# Patient Record
Sex: Male | Born: 1940 | Race: White | Hispanic: No | Smoking: Never smoker
Health system: Southern US, Community
[De-identification: ages and names within clinical notes are randomized; demographics above are authoritative.]

## PROBLEM LIST (undated history)

## (undated) DIAGNOSIS — S72009A Fracture of unspecified part of neck of unspecified femur, initial encounter for closed fracture: Secondary | ICD-10-CM

## (undated) DIAGNOSIS — T8859XA Other complications of anesthesia, initial encounter: Secondary | ICD-10-CM

## (undated) DIAGNOSIS — C259 Malignant neoplasm of pancreas, unspecified: Secondary | ICD-10-CM

## (undated) DIAGNOSIS — I10 Essential (primary) hypertension: Secondary | ICD-10-CM

## (undated) DIAGNOSIS — S7290XA Unspecified fracture of unspecified femur, initial encounter for closed fracture: Secondary | ICD-10-CM

## (undated) DIAGNOSIS — C8589 Other specified types of non-Hodgkin lymphoma, extranodal and solid organ sites: Secondary | ICD-10-CM

## (undated) HISTORY — PX: SHOULDER SURGERY: SHX246

## (undated) HISTORY — DX: Fracture of unspecified part of neck of unspecified femur, initial encounter for closed fracture: S72.009A

## (undated) HISTORY — DX: Unspecified fracture of unspecified femur, initial encounter for closed fracture: S72.90XA

## (undated) HISTORY — DX: Essential (primary) hypertension: I10

## (undated) HISTORY — DX: Malignant neoplasm of pancreas, unspecified: C25.9

## (undated) HISTORY — PX: LEG SURGERY: SHX1003

## (undated) HISTORY — DX: Other specified types of non-hodgkin lymphoma, extranodal and solid organ sites: C85.89

## (undated) HISTORY — PX: FEMUR SURGERY: SHX943

---

## 1998-06-08 DIAGNOSIS — C4491 Basal cell carcinoma of skin, unspecified: Secondary | ICD-10-CM

## 1998-06-08 HISTORY — DX: Basal cell carcinoma of skin, unspecified: C44.91

## 1998-07-05 ENCOUNTER — Ambulatory Visit (HOSPITAL_COMMUNITY): Admission: RE | Admit: 1998-07-05 | Discharge: 1998-07-05 | Payer: Self-pay | Admitting: Family Medicine

## 1999-06-17 ENCOUNTER — Encounter (INDEPENDENT_AMBULATORY_CARE_PROVIDER_SITE_OTHER): Payer: Self-pay | Admitting: Specialist

## 1999-06-17 ENCOUNTER — Ambulatory Visit (HOSPITAL_COMMUNITY): Admission: RE | Admit: 1999-06-17 | Discharge: 1999-06-17 | Payer: Self-pay | Admitting: Gastroenterology

## 2000-11-23 ENCOUNTER — Encounter: Payer: Self-pay | Admitting: General Surgery

## 2000-11-23 ENCOUNTER — Inpatient Hospital Stay (HOSPITAL_COMMUNITY): Admission: AD | Admit: 2000-11-23 | Discharge: 2000-11-28 | Payer: Self-pay | Admitting: General Surgery

## 2000-11-28 ENCOUNTER — Inpatient Hospital Stay
Admission: RE | Admit: 2000-11-28 | Discharge: 2000-12-24 | Payer: Self-pay | Admitting: Physical Medicine & Rehabilitation

## 2000-12-10 ENCOUNTER — Encounter: Payer: Self-pay | Admitting: Physical Medicine & Rehabilitation

## 2000-12-10 ENCOUNTER — Ambulatory Visit (HOSPITAL_COMMUNITY)
Admission: RE | Admit: 2000-12-10 | Discharge: 2000-12-10 | Payer: Self-pay | Admitting: Physical Medicine & Rehabilitation

## 2000-12-22 ENCOUNTER — Encounter: Payer: Self-pay | Admitting: Orthopedic Surgery

## 2000-12-24 ENCOUNTER — Inpatient Hospital Stay (HOSPITAL_COMMUNITY)
Admission: RE | Admit: 2000-12-24 | Discharge: 2001-01-09 | Payer: Self-pay | Admitting: Physical Medicine & Rehabilitation

## 2001-01-02 ENCOUNTER — Encounter: Payer: Self-pay | Admitting: Physical Medicine & Rehabilitation

## 2001-01-04 ENCOUNTER — Encounter: Payer: Self-pay | Admitting: Physical Medicine & Rehabilitation

## 2001-01-17 ENCOUNTER — Encounter
Admission: RE | Admit: 2001-01-17 | Discharge: 2001-04-17 | Payer: Self-pay | Admitting: Physical Medicine & Rehabilitation

## 2001-03-22 ENCOUNTER — Ambulatory Visit (HOSPITAL_COMMUNITY): Admission: RE | Admit: 2001-03-22 | Discharge: 2001-03-22 | Payer: Self-pay | Admitting: Orthopedic Surgery

## 2001-03-22 ENCOUNTER — Encounter: Payer: Self-pay | Admitting: Orthopedic Surgery

## 2001-04-18 ENCOUNTER — Encounter
Admission: RE | Admit: 2001-04-18 | Discharge: 2001-05-06 | Payer: Self-pay | Admitting: Physical Medicine & Rehabilitation

## 2001-05-24 ENCOUNTER — Encounter: Payer: Self-pay | Admitting: Orthopedic Surgery

## 2001-05-24 ENCOUNTER — Ambulatory Visit (HOSPITAL_COMMUNITY): Admission: RE | Admit: 2001-05-24 | Discharge: 2001-05-24 | Payer: Self-pay | Admitting: Orthopedic Surgery

## 2001-06-07 ENCOUNTER — Encounter: Payer: Self-pay | Admitting: Orthopedic Surgery

## 2001-06-07 ENCOUNTER — Ambulatory Visit (HOSPITAL_COMMUNITY): Admission: RE | Admit: 2001-06-07 | Discharge: 2001-06-07 | Payer: Self-pay | Admitting: Orthopedic Surgery

## 2001-07-25 ENCOUNTER — Encounter: Admission: RE | Admit: 2001-07-25 | Discharge: 2001-07-26 | Payer: Self-pay | Admitting: Orthopedic Surgery

## 2001-09-26 DIAGNOSIS — C4492 Squamous cell carcinoma of skin, unspecified: Secondary | ICD-10-CM

## 2001-09-26 HISTORY — DX: Squamous cell carcinoma of skin, unspecified: C44.92

## 2002-05-24 ENCOUNTER — Encounter: Payer: Self-pay | Admitting: Emergency Medicine

## 2002-05-24 ENCOUNTER — Emergency Department (HOSPITAL_COMMUNITY): Admission: EM | Admit: 2002-05-24 | Discharge: 2002-05-24 | Payer: Self-pay | Admitting: Emergency Medicine

## 2003-02-19 ENCOUNTER — Ambulatory Visit (HOSPITAL_COMMUNITY): Admission: RE | Admit: 2003-02-19 | Discharge: 2003-02-19 | Payer: Self-pay | Admitting: Family Medicine

## 2003-02-19 ENCOUNTER — Encounter: Payer: Self-pay | Admitting: Family Medicine

## 2006-01-17 ENCOUNTER — Encounter: Payer: Self-pay | Admitting: Family Medicine

## 2007-07-19 ENCOUNTER — Ambulatory Visit (HOSPITAL_COMMUNITY): Admission: RE | Admit: 2007-07-19 | Discharge: 2007-07-19 | Payer: Self-pay | Admitting: Family Medicine

## 2007-07-25 ENCOUNTER — Ambulatory Visit: Payer: Self-pay | Admitting: Hematology and Oncology

## 2007-07-30 ENCOUNTER — Encounter (INDEPENDENT_AMBULATORY_CARE_PROVIDER_SITE_OTHER): Payer: Self-pay | Admitting: Interventional Radiology

## 2007-07-30 ENCOUNTER — Ambulatory Visit (HOSPITAL_COMMUNITY): Admission: RE | Admit: 2007-07-30 | Discharge: 2007-07-30 | Payer: Self-pay | Admitting: General Surgery

## 2007-08-02 LAB — CBC WITH DIFFERENTIAL/PLATELET
Basophils Absolute: 0 10*3/uL (ref 0.0–0.1)
Eosinophils Absolute: 0.1 10*3/uL (ref 0.0–0.5)
HCT: 47.2 % (ref 38.7–49.9)
HGB: 16.1 g/dL (ref 13.0–17.1)
LYMPH%: 14.5 % (ref 14.0–48.0)
MCV: 82.6 fL (ref 81.6–98.0)
MONO#: 0.4 10*3/uL (ref 0.1–0.9)
MONO%: 4 % (ref 0.0–13.0)
NEUT#: 8.1 10*3/uL — ABNORMAL HIGH (ref 1.5–6.5)
NEUT%: 80 % — ABNORMAL HIGH (ref 40.0–75.0)
Platelets: 141 10*3/uL — ABNORMAL LOW (ref 145–400)
WBC: 10.1 10*3/uL — ABNORMAL HIGH (ref 4.0–10.0)

## 2007-08-02 LAB — COMPREHENSIVE METABOLIC PANEL
BUN: 18 mg/dL (ref 6–23)
CO2: 27 mEq/L (ref 19–32)
Creatinine, Ser: 1.15 mg/dL (ref 0.40–1.50)
Glucose, Bld: 96 mg/dL (ref 70–99)
Sodium: 139 mEq/L (ref 135–145)
Total Bilirubin: 1.1 mg/dL (ref 0.3–1.2)
Total Protein: 7 g/dL (ref 6.0–8.3)

## 2007-08-02 LAB — CANCER ANTIGEN 19-9: CA 19-9: 11.7 U/mL (ref <35.0)

## 2007-08-02 LAB — CEA: CEA: 1.9 ng/mL (ref 0.0–5.0)

## 2007-08-07 ENCOUNTER — Ambulatory Visit (HOSPITAL_COMMUNITY): Admission: RE | Admit: 2007-08-07 | Discharge: 2007-08-07 | Payer: Self-pay | Admitting: Hematology and Oncology

## 2007-09-06 ENCOUNTER — Ambulatory Visit (HOSPITAL_COMMUNITY): Admission: RE | Admit: 2007-09-06 | Discharge: 2007-09-06 | Payer: Self-pay | Admitting: Family Medicine

## 2007-09-23 ENCOUNTER — Encounter: Admission: RE | Admit: 2007-09-23 | Discharge: 2007-09-23 | Payer: Self-pay | Admitting: General Surgery

## 2007-10-14 ENCOUNTER — Encounter (INDEPENDENT_AMBULATORY_CARE_PROVIDER_SITE_OTHER): Payer: Self-pay | Admitting: General Surgery

## 2007-10-14 ENCOUNTER — Ambulatory Visit (HOSPITAL_COMMUNITY): Admission: RE | Admit: 2007-10-14 | Discharge: 2007-10-15 | Payer: Self-pay | Admitting: General Surgery

## 2007-10-23 ENCOUNTER — Ambulatory Visit: Payer: Self-pay | Admitting: Hematology and Oncology

## 2007-10-25 LAB — COMPREHENSIVE METABOLIC PANEL
AST: 24 U/L (ref 0–37)
BUN: 15 mg/dL (ref 6–23)
Calcium: 9.4 mg/dL (ref 8.4–10.5)
Chloride: 105 mEq/L (ref 96–112)
Creatinine, Ser: 1.06 mg/dL (ref 0.40–1.50)
Total Bilirubin: 0.5 mg/dL (ref 0.3–1.2)

## 2007-10-25 LAB — CBC WITH DIFFERENTIAL/PLATELET
BASO%: 0.5 % (ref 0.0–2.0)
Basophils Absolute: 0 10*3/uL (ref 0.0–0.1)
EOS%: 2.3 % (ref 0.0–7.0)
HCT: 42.6 % (ref 38.7–49.9)
HGB: 14.2 g/dL (ref 13.0–17.1)
LYMPH%: 16.1 % (ref 14.0–48.0)
MCH: 26.9 pg — ABNORMAL LOW (ref 28.0–33.4)
MCHC: 33.3 g/dL (ref 32.0–35.9)
MCV: 80.7 fL — ABNORMAL LOW (ref 81.6–98.0)
NEUT%: 74.3 % (ref 40.0–75.0)
Platelets: 204 10*3/uL (ref 145–400)
lymph#: 1.1 10*3/uL (ref 0.9–3.3)

## 2007-10-25 LAB — LACTATE DEHYDROGENASE: LDH: 173 U/L (ref 94–250)

## 2007-10-31 ENCOUNTER — Other Ambulatory Visit: Admission: RE | Admit: 2007-10-31 | Discharge: 2007-10-31 | Payer: Self-pay | Admitting: Hematology and Oncology

## 2007-10-31 ENCOUNTER — Encounter: Payer: Self-pay | Admitting: Hematology and Oncology

## 2007-10-31 ENCOUNTER — Ambulatory Visit (HOSPITAL_COMMUNITY): Admission: RE | Admit: 2007-10-31 | Discharge: 2007-10-31 | Payer: Self-pay | Admitting: Hematology and Oncology

## 2007-11-06 ENCOUNTER — Ambulatory Visit (HOSPITAL_COMMUNITY): Admission: RE | Admit: 2007-11-06 | Discharge: 2007-11-06 | Payer: Self-pay | Admitting: Hematology and Oncology

## 2007-11-20 LAB — CBC WITH DIFFERENTIAL/PLATELET
BASO%: 0.4 % (ref 0.0–2.0)
EOS%: 1.2 % (ref 0.0–7.0)
HCT: 43 % (ref 38.7–49.9)
LYMPH%: 14.6 % (ref 14.0–48.0)
MCH: 27.4 pg — ABNORMAL LOW (ref 28.0–33.4)
MCHC: 33.7 g/dL (ref 32.0–35.9)
MCV: 81.3 fL — ABNORMAL LOW (ref 81.6–98.0)
NEUT%: 78.5 % — ABNORMAL HIGH (ref 40.0–75.0)
Platelets: 138 10*3/uL — ABNORMAL LOW (ref 145–400)

## 2007-11-21 LAB — COMPREHENSIVE METABOLIC PANEL
ALT: 13 U/L (ref 0–53)
AST: 17 U/L (ref 0–37)
Albumin: 4.7 g/dL (ref 3.5–5.2)
Alkaline Phosphatase: 75 U/L (ref 39–117)
BUN: 16 mg/dL (ref 6–23)
CO2: 26 mEq/L (ref 19–32)
Calcium: 9.3 mg/dL (ref 8.4–10.5)
Chloride: 103 mEq/L (ref 96–112)
Creatinine, Ser: 1.1 mg/dL (ref 0.40–1.50)
Glucose, Bld: 94 mg/dL (ref 70–99)
Potassium: 3.8 mEq/L (ref 3.5–5.3)
Sodium: 140 mEq/L (ref 135–145)
Total Bilirubin: 1.3 mg/dL — ABNORMAL HIGH (ref 0.3–1.2)
Total Protein: 6.7 g/dL (ref 6.0–8.3)

## 2007-11-21 LAB — HEPATITIS PANEL, ACUTE
HCV Ab: NEGATIVE
Hep A IgM: NEGATIVE
Hep B C IgM: NEGATIVE
Hepatitis B Surface Ag: NEGATIVE

## 2007-11-21 LAB — LACTATE DEHYDROGENASE: LDH: 204 U/L (ref 94–250)

## 2007-12-09 ENCOUNTER — Ambulatory Visit: Payer: Self-pay | Admitting: Hematology and Oncology

## 2007-12-12 ENCOUNTER — Ambulatory Visit (HOSPITAL_COMMUNITY): Admission: RE | Admit: 2007-12-12 | Discharge: 2007-12-12 | Payer: Self-pay | Admitting: General Surgery

## 2007-12-19 LAB — COMPREHENSIVE METABOLIC PANEL
ALT: 19 U/L (ref 0–53)
AST: 24 U/L (ref 0–37)
Chloride: 103 mEq/L (ref 96–112)
Creatinine, Ser: 1.07 mg/dL (ref 0.40–1.50)
Total Bilirubin: 0.8 mg/dL (ref 0.3–1.2)

## 2007-12-19 LAB — CBC WITH DIFFERENTIAL/PLATELET
BASO%: 1.1 % (ref 0.0–2.0)
EOS%: 3.1 % (ref 0.0–7.0)
HCT: 43.4 % (ref 38.7–49.9)
MCH: 27.4 pg — ABNORMAL LOW (ref 28.0–33.4)
MCHC: 33.7 g/dL (ref 32.0–35.9)
NEUT%: 70.5 % (ref 40.0–75.0)
lymph#: 1.5 10*3/uL (ref 0.9–3.3)

## 2008-01-02 ENCOUNTER — Encounter: Admission: RE | Admit: 2008-01-02 | Discharge: 2008-01-02 | Payer: Self-pay | Admitting: General Surgery

## 2008-02-13 ENCOUNTER — Ambulatory Visit: Payer: Self-pay | Admitting: Hematology and Oncology

## 2008-02-18 LAB — COMPREHENSIVE METABOLIC PANEL
ALT: 19 U/L (ref 0–53)
Albumin: 4.6 g/dL (ref 3.5–5.2)
CO2: 26 mEq/L (ref 19–32)
Calcium: 9.6 mg/dL (ref 8.4–10.5)
Chloride: 105 mEq/L (ref 96–112)
Potassium: 4.4 mEq/L (ref 3.5–5.3)
Sodium: 142 mEq/L (ref 135–145)
Total Bilirubin: 0.6 mg/dL (ref 0.3–1.2)
Total Protein: 6.9 g/dL (ref 6.0–8.3)

## 2008-02-18 LAB — CBC WITH DIFFERENTIAL/PLATELET
Basophils Absolute: 0 10*3/uL (ref 0.0–0.1)
Eosinophils Absolute: 0.1 10*3/uL (ref 0.0–0.5)
HCT: 40.8 % (ref 38.7–49.9)
HGB: 13.9 g/dL (ref 13.0–17.1)
LYMPH%: 23.8 % (ref 14.0–48.0)
MCV: 83.8 fL (ref 81.6–98.0)
MONO#: 0.5 10*3/uL (ref 0.1–0.9)
MONO%: 11.4 % (ref 0.0–13.0)
NEUT#: 2.7 10*3/uL (ref 1.5–6.5)
Platelets: 155 10*3/uL (ref 145–400)
WBC: 4.2 10*3/uL (ref 4.0–10.0)

## 2008-02-18 LAB — LACTATE DEHYDROGENASE: LDH: 173 U/L (ref 94–250)

## 2008-02-24 ENCOUNTER — Ambulatory Visit (HOSPITAL_COMMUNITY): Admission: RE | Admit: 2008-02-24 | Discharge: 2008-02-24 | Payer: Self-pay | Admitting: Hematology and Oncology

## 2008-03-25 LAB — COMPREHENSIVE METABOLIC PANEL
Alkaline Phosphatase: 51 U/L (ref 39–117)
BUN: 17 mg/dL (ref 6–23)
CO2: 26 mEq/L (ref 19–32)
Creatinine, Ser: 1.01 mg/dL (ref 0.40–1.50)
Glucose, Bld: 179 mg/dL — ABNORMAL HIGH (ref 70–99)
Sodium: 142 mEq/L (ref 135–145)
Total Bilirubin: 0.8 mg/dL (ref 0.3–1.2)

## 2008-03-25 LAB — CBC WITH DIFFERENTIAL/PLATELET
Basophils Absolute: 0 10*3/uL (ref 0.0–0.1)
Eosinophils Absolute: 0 10*3/uL (ref 0.0–0.5)
HCT: 39.5 % (ref 38.7–49.9)
LYMPH%: 22.1 % (ref 14.0–48.0)
MCV: 86.6 fL (ref 81.6–98.0)
MONO%: 6 % (ref 0.0–13.0)
NEUT#: 3.3 10*3/uL (ref 1.5–6.5)
NEUT%: 70.9 % (ref 40.0–75.0)
Platelets: 182 10*3/uL (ref 145–400)
RBC: 4.57 10*6/uL (ref 4.20–5.71)

## 2008-04-12 ENCOUNTER — Ambulatory Visit: Payer: Self-pay | Admitting: Hematology and Oncology

## 2008-04-17 ENCOUNTER — Encounter: Admission: RE | Admit: 2008-04-17 | Discharge: 2008-04-17 | Payer: Self-pay | Admitting: Family Medicine

## 2008-04-24 LAB — CBC WITH DIFFERENTIAL/PLATELET
Basophils Absolute: 0.1 10*3/uL (ref 0.0–0.1)
EOS%: 6.6 % (ref 0.0–7.0)
Eosinophils Absolute: 0.3 10*3/uL (ref 0.0–0.5)
HGB: 13.7 g/dL (ref 13.0–17.1)
MCH: 29.2 pg (ref 28.0–33.4)
NEUT#: 2.9 10*3/uL (ref 1.5–6.5)
RDW: 14 % (ref 11.2–14.6)
lymph#: 0.9 10*3/uL (ref 0.9–3.3)

## 2008-04-24 LAB — BASIC METABOLIC PANEL
BUN: 14 mg/dL (ref 6–23)
Chloride: 106 mEq/L (ref 96–112)
Potassium: 3.8 mEq/L (ref 3.5–5.3)
Sodium: 140 mEq/L (ref 135–145)

## 2008-05-04 ENCOUNTER — Ambulatory Visit (HOSPITAL_COMMUNITY): Admission: RE | Admit: 2008-05-04 | Discharge: 2008-05-04 | Payer: Self-pay | Admitting: Hematology and Oncology

## 2008-09-07 ENCOUNTER — Ambulatory Visit: Payer: Self-pay | Admitting: Hematology and Oncology

## 2008-09-07 ENCOUNTER — Ambulatory Visit (HOSPITAL_COMMUNITY): Admission: RE | Admit: 2008-09-07 | Discharge: 2008-09-07 | Payer: Self-pay | Admitting: Hematology and Oncology

## 2008-09-09 LAB — CBC WITH DIFFERENTIAL/PLATELET
Basophils Absolute: 0 10*3/uL (ref 0.0–0.1)
Eosinophils Absolute: 0 10*3/uL (ref 0.0–0.5)
HGB: 14.5 g/dL (ref 13.0–17.1)
MCV: 87.1 fL (ref 81.6–98.0)
NEUT#: 6.3 10*3/uL (ref 1.5–6.5)
RDW: 13.9 % (ref 11.2–14.6)
lymph#: 0.9 10*3/uL (ref 0.9–3.3)

## 2008-09-09 LAB — COMPREHENSIVE METABOLIC PANEL
Albumin: 4.8 g/dL (ref 3.5–5.2)
BUN: 20 mg/dL (ref 6–23)
CO2: 25 mEq/L (ref 19–32)
Calcium: 9.6 mg/dL (ref 8.4–10.5)
Chloride: 103 mEq/L (ref 96–112)
Glucose, Bld: 98 mg/dL (ref 70–99)
Potassium: 5 mEq/L (ref 3.5–5.3)

## 2008-12-02 ENCOUNTER — Ambulatory Visit: Payer: Self-pay | Admitting: Hematology and Oncology

## 2008-12-02 ENCOUNTER — Ambulatory Visit (HOSPITAL_COMMUNITY): Admission: RE | Admit: 2008-12-02 | Discharge: 2008-12-02 | Payer: Self-pay | Admitting: Oncology

## 2008-12-08 ENCOUNTER — Ambulatory Visit (HOSPITAL_COMMUNITY): Admission: RE | Admit: 2008-12-08 | Discharge: 2008-12-08 | Payer: Self-pay | Admitting: General Surgery

## 2009-01-28 ENCOUNTER — Ambulatory Visit: Payer: Self-pay | Admitting: Hematology and Oncology

## 2009-02-01 ENCOUNTER — Ambulatory Visit (HOSPITAL_COMMUNITY): Admission: RE | Admit: 2009-02-01 | Discharge: 2009-02-01 | Payer: Self-pay | Admitting: Hematology and Oncology

## 2009-02-01 LAB — CBC WITH DIFFERENTIAL/PLATELET
Basophils Absolute: 0 10*3/uL (ref 0.0–0.1)
EOS%: 1.5 % (ref 0.0–7.0)
HCT: 43.9 % (ref 38.4–49.9)
HGB: 14.7 g/dL (ref 13.0–17.1)
LYMPH%: 16.3 % (ref 14.0–49.0)
MCH: 28.8 pg (ref 27.2–33.4)
MCV: 86.3 fL (ref 79.3–98.0)
NEUT%: 76 % — ABNORMAL HIGH (ref 39.0–75.0)
Platelets: 133 10*3/uL — ABNORMAL LOW (ref 140–400)
lymph#: 0.8 10*3/uL — ABNORMAL LOW (ref 0.9–3.3)

## 2009-02-01 LAB — COMPREHENSIVE METABOLIC PANEL
AST: 25 U/L (ref 0–37)
BUN: 10 mg/dL (ref 6–23)
Calcium: 9.7 mg/dL (ref 8.4–10.5)
Chloride: 105 mEq/L (ref 96–112)
Creatinine, Ser: 1.03 mg/dL (ref 0.40–1.50)

## 2009-02-01 LAB — LACTATE DEHYDROGENASE: LDH: 153 U/L (ref 94–250)

## 2009-07-20 ENCOUNTER — Ambulatory Visit: Payer: Self-pay | Admitting: Hematology and Oncology

## 2009-08-04 ENCOUNTER — Ambulatory Visit (HOSPITAL_COMMUNITY): Admission: RE | Admit: 2009-08-04 | Discharge: 2009-08-04 | Payer: Self-pay | Admitting: Hematology and Oncology

## 2009-08-17 ENCOUNTER — Ambulatory Visit (HOSPITAL_COMMUNITY): Admission: RE | Admit: 2009-08-17 | Discharge: 2009-08-17 | Payer: Self-pay | Admitting: Hematology and Oncology

## 2010-01-31 ENCOUNTER — Ambulatory Visit: Payer: Self-pay | Admitting: Hematology and Oncology

## 2010-02-01 LAB — CBC WITH DIFFERENTIAL/PLATELET
BASO%: 0.5 % (ref 0.0–2.0)
Basophils Absolute: 0 10*3/uL (ref 0.0–0.1)
EOS%: 1.4 % (ref 0.0–7.0)
HCT: 46.3 % (ref 38.4–49.9)
HGB: 15.5 g/dL (ref 13.0–17.1)
MCH: 28.8 pg (ref 27.2–33.4)
MCHC: 33.5 g/dL (ref 32.0–36.0)
MCV: 85.9 fL (ref 79.3–98.0)
MONO%: 7.2 % (ref 0.0–14.0)
NEUT%: 69.1 % (ref 39.0–75.0)
RDW: 15.3 % — ABNORMAL HIGH (ref 11.0–14.6)

## 2010-02-01 LAB — COMPREHENSIVE METABOLIC PANEL
ALT: 15 U/L (ref 0–53)
AST: 19 U/L (ref 0–37)
Alkaline Phosphatase: 63 U/L (ref 39–117)
BUN: 13 mg/dL (ref 6–23)
Creatinine, Ser: 1.17 mg/dL (ref 0.40–1.50)
Total Bilirubin: 1.2 mg/dL (ref 0.3–1.2)

## 2010-02-23 ENCOUNTER — Ambulatory Visit (HOSPITAL_COMMUNITY): Admission: RE | Admit: 2010-02-23 | Discharge: 2010-02-23 | Payer: Self-pay | Admitting: Hematology and Oncology

## 2010-02-23 LAB — COMPREHENSIVE METABOLIC PANEL
ALT: 19 U/L (ref 0–53)
AST: 23 U/L (ref 0–37)
CO2: 20 mEq/L (ref 19–32)
Calcium: 9.5 mg/dL (ref 8.4–10.5)
Chloride: 103 mEq/L (ref 96–112)
Creatinine, Ser: 1.13 mg/dL (ref 0.40–1.50)
Sodium: 140 mEq/L (ref 135–145)
Total Protein: 7.3 g/dL (ref 6.0–8.3)

## 2010-02-23 LAB — LACTATE DEHYDROGENASE: LDH: 186 U/L (ref 94–250)

## 2010-02-23 LAB — CBC WITH DIFFERENTIAL/PLATELET
BASO%: 0.1 % (ref 0.0–2.0)
EOS%: 0 % (ref 0.0–7.0)
MCH: 29 pg (ref 27.2–33.4)
MCHC: 33.7 g/dL (ref 32.0–36.0)
MONO#: 0 10*3/uL — ABNORMAL LOW (ref 0.1–0.9)
NEUT%: 94.2 % — ABNORMAL HIGH (ref 39.0–75.0)
RBC: 5.32 10*6/uL (ref 4.20–5.82)
RDW: 14.7 % — ABNORMAL HIGH (ref 11.0–14.6)
WBC: 8.2 10*3/uL (ref 4.0–10.3)
lymph#: 0.4 10*3/uL — ABNORMAL LOW (ref 0.9–3.3)

## 2010-09-14 ENCOUNTER — Ambulatory Visit: Payer: Self-pay | Admitting: Hematology and Oncology

## 2010-09-22 ENCOUNTER — Ambulatory Visit (HOSPITAL_COMMUNITY)
Admission: RE | Admit: 2010-09-22 | Discharge: 2010-09-22 | Payer: Self-pay | Source: Home / Self Care | Attending: Hematology and Oncology | Admitting: Hematology and Oncology

## 2010-09-22 LAB — CBC WITH DIFFERENTIAL/PLATELET
BASO%: 0 % (ref 0.0–2.0)
Basophils Absolute: 0 10*3/uL (ref 0.0–0.1)
EOS%: 0 % (ref 0.0–7.0)
Eosinophils Absolute: 0 10*3/uL (ref 0.0–0.5)
HCT: 46.7 % (ref 38.4–49.9)
HGB: 15.5 g/dL (ref 13.0–17.1)
LYMPH%: 5.2 % — ABNORMAL LOW (ref 14.0–49.0)
MCH: 28.9 pg (ref 27.2–33.4)
MCHC: 33.2 g/dL (ref 32.0–36.0)
MCV: 87.1 fL (ref 79.3–98.0)
MONO#: 0 10*3/uL — ABNORMAL LOW (ref 0.1–0.9)
MONO%: 0.1 % (ref 0.0–14.0)
NEUT#: 7.2 10*3/uL — ABNORMAL HIGH (ref 1.5–6.5)
NEUT%: 94.7 % — ABNORMAL HIGH (ref 39.0–75.0)
Platelets: 163 10*3/uL (ref 140–400)
RBC: 5.35 10*6/uL (ref 4.20–5.82)
RDW: 14.8 % — ABNORMAL HIGH (ref 11.0–14.6)
WBC: 7.6 10*3/uL (ref 4.0–10.3)
lymph#: 0.4 10*3/uL — ABNORMAL LOW (ref 0.9–3.3)

## 2010-09-22 LAB — CMP (CANCER CENTER ONLY)
ALT(SGPT): 26 U/L (ref 10–47)
AST: 28 U/L (ref 11–38)
Albumin: 4.3 g/dL (ref 3.3–5.5)
Alkaline Phosphatase: 53 U/L (ref 26–84)
BUN, Bld: 16 mg/dL (ref 7–22)
CO2: 27 mEq/L (ref 18–33)
Calcium: 9.3 mg/dL (ref 8.0–10.3)
Chloride: 102 mEq/L (ref 98–108)
Creat: 1.2 mg/dl (ref 0.6–1.2)
Glucose, Bld: 165 mg/dL — ABNORMAL HIGH (ref 73–118)
Potassium: 4.4 mEq/L (ref 3.3–4.7)
Sodium: 145 mEq/L (ref 128–145)
Total Bilirubin: 1.3 mg/dl (ref 0.20–1.60)
Total Protein: 7.7 g/dL (ref 6.4–8.1)

## 2010-09-22 LAB — GLUCOSE, CAPILLARY: Glucose-Capillary: 157 mg/dL — ABNORMAL HIGH (ref 70–99)

## 2010-09-23 LAB — TSH: TSH: 1.113 u[IU]/mL (ref 0.350–4.500)

## 2010-09-23 LAB — LACTATE DEHYDROGENASE: LDH: 171 U/L (ref 94–250)

## 2010-09-23 LAB — VITAMIN D 25 HYDROXY (VIT D DEFICIENCY, FRACTURES): Vit D, 25-Hydroxy: 92 ng/mL — ABNORMAL HIGH (ref 30–89)

## 2010-10-08 ENCOUNTER — Other Ambulatory Visit: Payer: Self-pay | Admitting: Hematology and Oncology

## 2010-10-08 DIAGNOSIS — C8590 Non-Hodgkin lymphoma, unspecified, unspecified site: Secondary | ICD-10-CM

## 2010-10-09 ENCOUNTER — Encounter: Payer: Self-pay | Admitting: Hematology and Oncology

## 2010-10-09 ENCOUNTER — Encounter: Payer: Self-pay | Admitting: General Surgery

## 2010-12-21 LAB — GLUCOSE, CAPILLARY: Glucose-Capillary: 89 mg/dL (ref 70–99)

## 2010-12-27 LAB — GLUCOSE, CAPILLARY: Glucose-Capillary: 84 mg/dL (ref 70–99)

## 2010-12-29 LAB — COMPREHENSIVE METABOLIC PANEL
AST: 26 U/L (ref 0–37)
Albumin: 4.3 g/dL (ref 3.5–5.2)
Alkaline Phosphatase: 52 U/L (ref 39–117)
BUN: 11 mg/dL (ref 6–23)
Chloride: 106 mEq/L (ref 96–112)
GFR calc Af Amer: >60 mL/min (ref >60)
Potassium: 4.2 mEq/L (ref 3.5–5.1)
Total Protein: 6.7 g/dL (ref 6.0–8.3)

## 2010-12-29 LAB — CBC
HCT: 44 % (ref 39.0–52.0)
Platelets: 137 10*3/uL — ABNORMAL LOW (ref 150–400)
RDW: 13.9 % (ref 11.5–15.5)
WBC: 5.5 10*3/uL (ref 4.0–10.5)

## 2010-12-29 LAB — DIFFERENTIAL
Basophils Relative: 1 % (ref 0–1)
Eosinophils Relative: 2 % (ref 0–5)
Lymphocytes Relative: 18 % (ref 12–46)
Monocytes Absolute: 0.4 10*3/uL (ref 0.1–1.0)
Monocytes Relative: 7 % (ref 3–12)
Neutro Abs: 4.1 10*3/uL (ref 1.7–7.7)
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
## 2011-01-31 NOTE — Op Note (Signed)
NAME:  [name] [name], [name] [name] NO.:  [id number]   MEDICAL RECORD NO.:  [id number]          PATIENT TYPE:  AMB   LOCATION:  SDS                          FACILITY:  [facility]   PHYSICIAN:  [name], M.D.    DATE OF BIRTH:  [birth year]   DATE OF PROCEDURE:  12/08/2008  DATE OF DISCHARGE:  12/08/2008                               OPERATIVE REPORT   PREOPERATIVE DIAGNOSIS:  Nonfunctional left subclavian Port-A-Cath.   POSTOPERATIVE DIAGNOSIS:  Nonfunctional left subclavian Port-A-Cath.   PROCEDURE:  1. Removal of left subclavian Port-A-Cath.  2. Placement of right subclavian power port Port-A-Cath, MRI      compatible, 8-French.   SURGEON:  [name], MD   ANESTHESIA:  Monitored anesthesia care with 1% Xylocaine with  epinephrine.   ESTIMATED BLOOD LOSS:  Less than 10 cc.   COMPLICATIONS:  None.   CONDITION:  Stable.   FINDINGS:  No evidence of thrombosis of the catheter itself.  It was  removed completely intact from the left side.  The new catheter tip was  into the mid right atrium.  Chest x-ray is pending.   OPERATION:  The patient was taken to the operating room, placed on table  in supine position.  After an adequate amount of IV sedation was given,  he was prepped and draped in the usual sterile manner exposing both the  chest wall and subclavian areas.   We anesthetized the previous incision line for the left subclavian Port-  A-Cath using 1% Xylocaine with epinephrine.  We used a fitting blade to  make the transverse incision above the pocket on the left side, then we  dissected out the junction between the catheter and the port using  Hemostat clamp.  Once this was done, we were able to lift and draw the  catheter from the subclavian vein and then dissect out the port from its  pocket using a #15 blade.  We were able to remove the catheter with  attached port intact.  Once this was done, we irrigated with saline  solution and we closed the wound  using a 3-0 Vicryl deep layer.  We  closed the skin using a Dermabond only with Steri-Strips and Tegaderm as  final dressing.   On the right side, after anesthetizing the subclavian area with a 25-  gauge needle and 1% Xylocaine with epinephrine, we accessed the right  subclavian vein using a single stick of a 14-gauge needle using a  Seldinger technique to pass the wire into the vein.  We made an adequate  pocket on the anterior chest wall on the right side and dissected the  catheter from the pocket site up to the subclavian wire entrance site.  We then passed a dilator and introducer over the wire into the vein and  then removed the wire and dilator.  We passed a catheter deeply into the  right heart system confirming its position using fluoroscopy.  Under  fluoro, we pulled it back to the mid atrial range, then cut the catheter  to the appropriate length attaching it to the  power port with the  locking cap.   Once this was done, we placed the port into the pocket securing it in  place with 3-0 Vicryl.  We fluoroscoped again to make sure we did not  dislodge or move the catheter significantly during the placement of the  port.  Once this was done, we flushed with heparinized solution.  Both  the cath and the port had been flushed with heparinized saline prior to  being placed.  We then closed the pocket using 4-0 Vicryl to close the  subcu and then skin was closed using running subcuticular stitch of 4-0  Monocryl.  The subclavian site was closed with 4-0 Monocryl.  Dermabond,  Steri-Strips, and Tegaderm were used to complete the dressing.  We then  injected 3 cc of 1000 units/cc heparin solution into the port.  This was  prior to placing the OpSite dressing over the catheter port site.  All  counts were correct.      [name], M.D.  Electronically Signed     JOW/MEDQ  D:  12/08/2008  T:  12/09/2008  Job:  [id number]

## 2011-01-31 NOTE — Op Note (Signed)
NAME:  [name] [name], [name] [name] NO.:  [id number]   MEDICAL RECORD NO.:  [id number]          PATIENT TYPE:  AMB   LOCATION:  SDS                          FACILITY:  [facility]   PHYSICIAN:  [name], M.D.    DATE OF BIRTH:  1941/06/21   DATE OF PROCEDURE:  12/12/2007  DATE OF DISCHARGE:  12/12/2007                               OPERATIVE REPORT   PREOPERATIVE DIAGNOSIS:  Lymphoma.   POSTOPERATIVE DIAGNOSIS:  Lymphoma with post-operative left  pneumothorax.   PROCEDURES:  1. Placement of 8-French Power Port left subclavian Port-A-Cath.  2. Placement of left chest 12-gauge anterior tube thoracostomy with a      Heimlich valve.   SURGEON:  Marta Lamas. Lindie Spruce, M.D.   ANESTHESIA:  Monitored anesthesia care with local.   ESTIMATED BLOOD LOSS:  Less than 20 mL.   FINDINGS:  The tip of the catheter was at the proximal superior vena  cava by fluoro.  He had a small pneumothorax on the left side, which was  evacuated mostly with the Heimlich valve in place.   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   COMPLICATIONS:  Left pneumothorax.   OPERATION:  The patient was taken to the operating room, placed on the  table in supine position.  After an adequate amount of IV sedation was  given, he was prepped and draped in the usual sterile manner exposing  the left subclavian area.   We anesthetized the left subclavian area with Xylocaine, then accessed  the left subclavian vein with a single stick of the 14-gauge needle.  However, all upon retrieving the needle as we went into the vein. there  was some aspiration of air and I was suspicious that we had gotten into  the left thoracic cavity or punctured the lung.  We were able to pass a  wire into the subclavian system.  Initial fluoro showed it went up into  the jugular; however, we were able to redirect it  down towards the heart  under fluoro.  We made a pocket on the anterior chest wall just above  the left nipple using lidocaine, a 15 blade, then blunt dissection for a  pocket, where two 3-0 Vicryl stitches were placed in order to securing  the port.  We placed a sponge with antibiotic solution in there prior to  implanting the  port and then dissected from the port site up to the  subclavian area with the catheter.  We flushed the catheter with  heparinized saline solution.  Once this was done, we passed an  introducer and dilator over the wire into the subclavian system and then  passed the catheter through the introducer into the subclavian system  with minimal difficulty.  We pulled it back to the proximal atrium on  fluoro and once we had cut the catheter, attached it to the port,  implanted the port in this pocket and secured it with the 3-0 Vicryl  suture in place, it  had pulled back to the proximal superior vena cava.  Again had we suspected a pneumothorax, which was confirmed by fluoro.  We placed a Heimlich valve 20-French angiocatheter into the second  intercostal space in the midclavicular line, aspirating air as we went  in.  We placed it to the hub and secured it in place with a 2-0 silk  suture.  We attached it to the tubing and the Heimlich valve itself and  attached that to the patient.  A chest x-ray is pending; however, fluoro  did confirm mostly evacuation of the pneumothorax.   We flushed the Port-A-Cath with 3 mL of 1000 units/mL heparin solution  after we had placed the Heimlich valve.  We closed the port site with 3-  0 Vicryl subcu and then a subcuticular 5-0 Vicryl.  The entrance site at  the subclavian was closed with subcuticular 5-0 Vicryl.  Dermabond,  Steri-Strips and Tegaderm were applied and the Heimlich valve was  secured in place with Steri-Strips and OpSite.  All counts were correct.  Chest x-ray is pending.      [name], M.D.   Electronically Signed     JOW/MEDQ  D:  12/12/2007  T:  12/12/2007  Job:  [id number]
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
## 2011-02-06 ENCOUNTER — Other Ambulatory Visit (HOSPITAL_COMMUNITY): Payer: Self-pay

## 2011-02-08 ENCOUNTER — Other Ambulatory Visit: Payer: Self-pay | Admitting: Hematology and Oncology

## 2011-02-08 DIAGNOSIS — C8590 Non-Hodgkin lymphoma, unspecified, unspecified site: Secondary | ICD-10-CM

## 2011-03-06 ENCOUNTER — Encounter (HOSPITAL_BASED_OUTPATIENT_CLINIC_OR_DEPARTMENT_OTHER): Payer: Medicare Other | Admitting: Hematology and Oncology

## 2011-03-06 ENCOUNTER — Other Ambulatory Visit: Payer: Self-pay | Admitting: Hematology and Oncology

## 2011-03-06 ENCOUNTER — Ambulatory Visit (HOSPITAL_COMMUNITY)
Admission: RE | Admit: 2011-03-06 | Discharge: 2011-03-06 | Disposition: A | Discharge status: Home / Self Care | Payer: Medicare Other | Source: Ambulatory Visit | Attending: Hematology and Oncology | Admitting: Hematology and Oncology

## 2011-03-06 DIAGNOSIS — C259 Malignant neoplasm of pancreas, unspecified: Secondary | ICD-10-CM

## 2011-03-06 DIAGNOSIS — C8590 Non-Hodgkin lymphoma, unspecified, unspecified site: Secondary | ICD-10-CM

## 2011-03-06 LAB — CMP (CANCER CENTER ONLY)
AST: 30 U/L (ref 11–38)
Alkaline Phosphatase: 61 U/L (ref 26–84)
BUN, Bld: 16 mg/dL (ref 7–22)
Calcium: 9.4 mg/dL (ref 8.0–10.3)
Creat: 1.4 mg/dl — ABNORMAL HIGH (ref 0.6–1.2)
Total Bilirubin: 1.1 mg/dl (ref 0.20–1.60)

## 2011-03-06 LAB — CBC WITH DIFFERENTIAL/PLATELET
Basophils Absolute: 0 10*3/uL (ref 0.0–0.1)
EOS%: 1.7 % (ref 0.0–7.0)
HCT: 46.1 % (ref 38.4–49.9)
HGB: 15.3 g/dL (ref 13.0–17.1)
LYMPH%: 19.6 % (ref 14.0–49.0)
MCH: 29 pg (ref 27.2–33.4)
MCHC: 33.3 g/dL (ref 32.0–36.0)
MCV: 87.1 fL (ref 79.3–98.0)
MONO%: 5.8 % (ref 0.0–14.0)
NEUT%: 72.1 % (ref 39.0–75.0)

## 2011-03-07 ENCOUNTER — Ambulatory Visit (HOSPITAL_COMMUNITY)
Admission: RE | Admit: 2011-03-07 | Discharge: 2011-03-07 | Disposition: A | Discharge status: Home / Self Care | Payer: Medicare Other | Source: Ambulatory Visit | Attending: Hematology and Oncology | Admitting: Hematology and Oncology

## 2011-03-07 DIAGNOSIS — K7689 Other specified diseases of liver: Secondary | ICD-10-CM | POA: Insufficient documentation

## 2011-03-07 DIAGNOSIS — C8589 Other specified types of non-Hodgkin lymphoma, extranodal and solid organ sites: Secondary | ICD-10-CM | POA: Insufficient documentation

## 2011-03-07 DIAGNOSIS — J984 Other disorders of lung: Secondary | ICD-10-CM | POA: Insufficient documentation

## 2011-03-07 DIAGNOSIS — N400 Enlarged prostate without lower urinary tract symptoms: Secondary | ICD-10-CM | POA: Insufficient documentation

## 2011-03-07 MED ORDER — IOHEXOL 300 MG/ML  SOLN
100.0000 mL | Freq: Once | INTRAMUSCULAR | Status: AC | PRN
  Administered 2011-03-07: 100 mL via INTRAVENOUS

## 2011-03-08 ENCOUNTER — Other Ambulatory Visit: Payer: Self-pay | Admitting: Hematology and Oncology

## 2011-03-08 ENCOUNTER — Encounter (HOSPITAL_BASED_OUTPATIENT_CLINIC_OR_DEPARTMENT_OTHER): Payer: Medicare Other | Admitting: Hematology and Oncology

## 2011-03-08 DIAGNOSIS — C8589 Other specified types of non-Hodgkin lymphoma, extranodal and solid organ sites: Secondary | ICD-10-CM

## 2011-03-08 DIAGNOSIS — C8590 Non-Hodgkin lymphoma, unspecified, unspecified site: Secondary | ICD-10-CM

## 2011-05-08 ENCOUNTER — Encounter (HOSPITAL_BASED_OUTPATIENT_CLINIC_OR_DEPARTMENT_OTHER): Payer: Medicare Other | Admitting: Hematology and Oncology

## 2011-05-08 DIAGNOSIS — C8589 Other specified types of non-Hodgkin lymphoma, extranodal and solid organ sites: Secondary | ICD-10-CM

## 2011-05-08 DIAGNOSIS — Z452 Encounter for adjustment and management of vascular access device: Secondary | ICD-10-CM

## 2011-06-08 LAB — ABO/RH: ABO/RH(D): AB POS

## 2011-06-08 LAB — CBC
HCT: 39.8
Hemoglobin: 15.1
MCV: 82.4
RBC: 4.83
RDW: 15.8 — ABNORMAL HIGH
WBC: 11.1 — ABNORMAL HIGH
WBC: 8.4

## 2011-06-08 LAB — COMPREHENSIVE METABOLIC PANEL
ALT: 18
AST: 23
Albumin: 4.3
Calcium: 9.8
GFR calc Af Amer: >60
Sodium: 140
Total Protein: 6.7

## 2011-06-08 LAB — DIFFERENTIAL
Eosinophils Absolute: 0
Eosinophils Absolute: 0.1
Eosinophils Relative: 0
Eosinophils Relative: 1
Lymphocytes Relative: 15
Lymphocytes Relative: 5 — ABNORMAL LOW
Lymphs Abs: 0.6 — ABNORMAL LOW
Lymphs Abs: 1.2
Monocytes Relative: 4
Monocytes Relative: 6
Neutrophils Relative %: 91 — ABNORMAL HIGH

## 2011-06-09 LAB — DIFFERENTIAL
Basophils Relative: 0
Eosinophils Absolute: 0.1
Eosinophils Relative: 2
Lymphs Abs: 1.2
Monocytes Relative: 6
Neutrophils Relative %: 76

## 2011-06-09 LAB — CBC
HCT: 43.2
MCHC: 33.4
MCV: 81
RBC: 5.33
WBC: 7.6

## 2011-06-09 LAB — CHROMOSOME ANALYSIS, BONE MARROW

## 2011-06-12 LAB — CBC
HCT: 44.2
MCHC: 33.3
MCV: 82.7
Platelets: 155
RDW: 15.9 — ABNORMAL HIGH

## 2011-06-12 LAB — BASIC METABOLIC PANEL
BUN: 10
CO2: 32
Chloride: 103
Creatinine, Ser: 1.1
Glucose, Bld: 106 — ABNORMAL HIGH
Potassium: 4.5

## 2011-06-23 LAB — GLUCOSE, CAPILLARY: Glucose-Capillary: 111 mg/dL — ABNORMAL HIGH (ref 70–99)

## 2011-06-27 LAB — CBC
MCHC: 33.5
MCV: 83.4
RBC: 5.47
RDW: 14.5

## 2011-07-03 ENCOUNTER — Encounter (HOSPITAL_BASED_OUTPATIENT_CLINIC_OR_DEPARTMENT_OTHER): Payer: Medicare Other | Admitting: Hematology and Oncology

## 2011-07-03 DIAGNOSIS — Z452 Encounter for adjustment and management of vascular access device: Secondary | ICD-10-CM

## 2011-07-03 DIAGNOSIS — C8589 Other specified types of non-Hodgkin lymphoma, extranodal and solid organ sites: Secondary | ICD-10-CM

## 2011-08-28 ENCOUNTER — Ambulatory Visit (HOSPITAL_BASED_OUTPATIENT_CLINIC_OR_DEPARTMENT_OTHER): Payer: Medicare Other

## 2011-08-28 DIAGNOSIS — C8589 Other specified types of non-Hodgkin lymphoma, extranodal and solid organ sites: Secondary | ICD-10-CM

## 2011-08-28 DIAGNOSIS — C8590 Non-Hodgkin lymphoma, unspecified, unspecified site: Secondary | ICD-10-CM

## 2011-08-28 DIAGNOSIS — Z452 Encounter for adjustment and management of vascular access device: Secondary | ICD-10-CM

## 2011-08-28 MED ORDER — SODIUM CHLORIDE 0.9 % IJ SOLN
10.0000 mL | INTRAMUSCULAR | Status: DC | PRN
  Administered 2011-08-28: 10 mL via INTRAVENOUS
  Filled 2011-08-28: qty 10

## 2011-08-28 MED ORDER — HEPARIN SOD (PORK) LOCK FLUSH 100 UNIT/ML IV SOLN
500.0000 [IU] | Freq: Once | INTRAVENOUS | Status: AC
  Administered 2011-08-28: 500 [IU] via INTRAVENOUS
  Filled 2011-08-28: qty 5

## 2011-09-06 ENCOUNTER — Telehealth: Payer: Self-pay | Admitting: Hematology and Oncology

## 2011-09-06 NOTE — Telephone Encounter (Signed)
Pt lmonvm re appt for 2013. Returned call and lmonvm for pt re appt for 1/11 @ 3:30 pm. Pt aware of 1/2 ct.

## 2011-09-15 ENCOUNTER — Telehealth: Payer: Self-pay | Admitting: *Deleted

## 2011-09-15 ENCOUNTER — Other Ambulatory Visit (HOSPITAL_BASED_OUTPATIENT_CLINIC_OR_DEPARTMENT_OTHER): Payer: Medicare Other | Admitting: Lab

## 2011-09-15 ENCOUNTER — Other Ambulatory Visit: Payer: Self-pay | Admitting: *Deleted

## 2011-09-15 DIAGNOSIS — Z452 Encounter for adjustment and management of vascular access device: Secondary | ICD-10-CM

## 2011-09-15 DIAGNOSIS — C8589 Other specified types of non-Hodgkin lymphoma, extranodal and solid organ sites: Secondary | ICD-10-CM

## 2011-09-15 DIAGNOSIS — C8588 Other specified types of non-Hodgkin lymphoma, lymph nodes of multiple sites: Secondary | ICD-10-CM

## 2011-09-15 LAB — CBC WITH DIFFERENTIAL/PLATELET
BASO%: 0.2 % (ref 0.0–2.0)
LYMPH%: 20.5 % (ref 14.0–49.0)
MCHC: 33.8 g/dL (ref 32.0–36.0)
MONO#: 0.3 10*3/uL (ref 0.1–0.9)
MONO%: 5.3 % (ref 0.0–14.0)
NEUT#: 4.4 10*3/uL (ref 1.5–6.5)
Platelets: 141 10*3/uL (ref 140–400)
RBC: 5.37 10*6/uL (ref 4.20–5.82)
RDW: 14.1 % (ref 11.0–14.6)
WBC: 6.1 10*3/uL (ref 4.0–10.3)

## 2011-09-15 NOTE — Telephone Encounter (Signed)
Duplicate info

## 2011-09-16 LAB — COMPREHENSIVE METABOLIC PANEL
ALT: 15 U/L (ref 0–53)
Albumin: 5 g/dL (ref 3.5–5.2)
Alkaline Phosphatase: 53 U/L (ref 39–117)
CO2: 25 mEq/L (ref 19–32)
Potassium: 4.4 mEq/L (ref 3.5–5.3)
Sodium: 140 mEq/L (ref 135–145)
Total Bilirubin: 1 mg/dL (ref 0.3–1.2)
Total Protein: 7.4 g/dL (ref 6.0–8.3)

## 2011-09-18 ENCOUNTER — Encounter: Payer: Self-pay | Admitting: Nurse Practitioner

## 2011-09-18 ENCOUNTER — Other Ambulatory Visit: Payer: Self-pay | Admitting: Hematology & Oncology

## 2011-09-18 ENCOUNTER — Other Ambulatory Visit: Payer: Self-pay | Admitting: Nurse Practitioner

## 2011-09-20 ENCOUNTER — Ambulatory Visit (HOSPITAL_COMMUNITY)
Admission: RE | Admit: 2011-09-20 | Discharge: 2011-09-20 | Disposition: A | Discharge status: Home / Self Care | Payer: Medicare Other | Source: Ambulatory Visit | Attending: Hematology and Oncology | Admitting: Hematology and Oncology

## 2011-09-20 ENCOUNTER — Encounter (HOSPITAL_COMMUNITY): Payer: Self-pay

## 2011-09-20 DIAGNOSIS — C8589 Other specified types of non-Hodgkin lymphoma, extranodal and solid organ sites: Secondary | ICD-10-CM | POA: Insufficient documentation

## 2011-09-20 DIAGNOSIS — I700 Atherosclerosis of aorta: Secondary | ICD-10-CM | POA: Insufficient documentation

## 2011-09-20 DIAGNOSIS — C8590 Non-Hodgkin lymphoma, unspecified, unspecified site: Secondary | ICD-10-CM

## 2011-09-20 DIAGNOSIS — N281 Cyst of kidney, acquired: Secondary | ICD-10-CM | POA: Insufficient documentation

## 2011-09-20 DIAGNOSIS — N400 Enlarged prostate without lower urinary tract symptoms: Secondary | ICD-10-CM | POA: Insufficient documentation

## 2011-09-20 DIAGNOSIS — Z9221 Personal history of antineoplastic chemotherapy: Secondary | ICD-10-CM | POA: Insufficient documentation

## 2011-09-20 MED ORDER — IOHEXOL 300 MG/ML  SOLN
100.0000 mL | Freq: Once | INTRAMUSCULAR | Status: AC | PRN
  Administered 2011-09-20: 100 mL via INTRAVENOUS

## 2011-09-26 ENCOUNTER — Telehealth: Payer: Self-pay | Admitting: *Deleted

## 2011-09-26 NOTE — Telephone Encounter (Signed)
Spoke with pt and informed pt to come in at 245 pm for f/u visit on 09/29/11.   Pt voiced  Understanding.

## 2011-09-29 ENCOUNTER — Ambulatory Visit (HOSPITAL_BASED_OUTPATIENT_CLINIC_OR_DEPARTMENT_OTHER): Payer: Medicare Other | Admitting: Hematology and Oncology

## 2011-09-29 VITALS — BP 128/69 | HR 84 | Temp 98.3°F | Wt 180.1 lb

## 2011-09-29 DIAGNOSIS — C8590 Non-Hodgkin lymphoma, unspecified, unspecified site: Secondary | ICD-10-CM

## 2011-09-29 DIAGNOSIS — Z9221 Personal history of antineoplastic chemotherapy: Secondary | ICD-10-CM

## 2011-09-29 DIAGNOSIS — Z87898 Personal history of other specified conditions: Secondary | ICD-10-CM

## 2011-09-29 MED ORDER — HEPARIN SOD (PORK) LOCK FLUSH 100 UNIT/ML IV SOLN
500.0000 [IU] | Freq: Once | INTRAVENOUS | Status: DC
  Filled 2011-09-29: qty 5

## 2011-09-29 MED ORDER — ALTEPLASE 2 MG IJ SOLR
2.0000 mg | Freq: Once | INTRAMUSCULAR | Status: DC | PRN
  Filled 2011-09-29: qty 2

## 2011-09-29 MED ORDER — SODIUM CHLORIDE 0.9 % IJ SOLN
10.0000 mL | INTRAMUSCULAR | Status: DC | PRN
  Filled 2011-09-29: qty 10

## 2011-09-29 NOTE — Progress Notes (Signed)
This office note has been dictated.

## 2011-09-30 NOTE — Progress Notes (Signed)
CC:   [name] [name], M.D. [name] [name], M.D. [name] [name], M.D.  IDENTIFYING STATEMENT:  This. Is a 71 year old man with non-Hodgkin lymphoma who presents for followup.  INTERIM HISTORY:  [name] [name] was seen 7 months ago and has had no major issues or concerns since his last followup visit.  He denies fever, chills, or night sweats.  He denies chest pain, shortness of breath.  He has not noted any adenopathy.  His weight is stable.  MEDICATIONS:  Reviewed and updated.  PAST MEDICAL HISTORY, FAMILY HISTORY, AND SOCIAL HISTORY:  Unchanged.  ALLERGIES:  IronXcell and sulfa antibiotics.  PHYSICAL EXAM:  General: The patient is a well-appearing, well- nourished, man in no distress.  Vitals: Pulse 83, blood pressure 128/69, temperature 98.3, respirations 18, and weight 180.1 pounds.  HEENT: Head is atraumatic, normocephalic.  Sclerae anicteric.  Mouth moist.  No thrush.  Neck:  Supple.  Chest:  Clear to percussion and auscultation. Port-A-Cath site infection.  Abdomen:  Soft, nontender.  Bowel sounds present.  Lymph nodes: No palpable adenopathy.  Extremities: No calf tenderness or edema.  Pulses present symmetrical.  CNS: Nonfocal.  LAB DATA:  09/15/2011, white cell count 6.1, hemoglobin 15.9, hematocrit 47, and platelets 141. Sodium 140, potassium 4.4, chloride 102, CO2 25, BUN 14, creatinine 1.1, and glucose 90. T bilirubin 1, alkaline phosphatase 53, AST 21, and ALT 15. Calcium 9.6.  Vitamin D 66.  IMAGING DATA:  Results of CT of the chest, abdomen, and pelvis notes no evidence of lymphoma involving the chest.  The abdomen and pelvis showed stable residual soft tissue/treated lymphoma involving the central mesenteric vessels. Otherwise, no evidence of active lymphomatous involvement in the abdomen and pelvis.  There was prostatomegaly which approximately measured 5.4 cm.  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  He  received 1 cycle of Rituxan with cyclophosphamide, hydroxydaunorubicin, Oncovin, prednisone at the [facility], and then went off to [place] to begin holistic therapy.  He also received abbreviated doses of rituximab plus cyclophosphamide, doxorubicin, vincristine, and prednisone, whole-body hypothermia, and holistic therapy.  [place] then returned to the [place] and received Rituxan infusions and was last given a dose on April 24, 2008. Current CT scans and blood work indicate no evidence of recurrence.  So with this said, we will have him follow up in 9 months' time with repeat staging studies and lab work.   ______________________________ Laurice Record, M.D. LIO/MEDQ  D:  09/29/2011  T:  09/30/2011  Job:  [id number]

## 2011-11-02 ENCOUNTER — Ambulatory Visit (HOSPITAL_BASED_OUTPATIENT_CLINIC_OR_DEPARTMENT_OTHER): Payer: Medicare Other

## 2011-11-02 DIAGNOSIS — C8590 Non-Hodgkin lymphoma, unspecified, unspecified site: Secondary | ICD-10-CM

## 2011-11-02 DIAGNOSIS — C8589 Other specified types of non-Hodgkin lymphoma, extranodal and solid organ sites: Secondary | ICD-10-CM

## 2011-11-02 MED ORDER — HEPARIN SOD (PORK) LOCK FLUSH 100 UNIT/ML IV SOLN
500.0000 [IU] | Freq: Once | INTRAVENOUS | Status: AC
  Administered 2011-11-02: 500 [IU] via INTRAVENOUS
  Filled 2011-11-02: qty 5

## 2011-11-02 MED ORDER — SODIUM CHLORIDE 0.9 % IJ SOLN
10.0000 mL | INTRAMUSCULAR | Status: DC | PRN
  Administered 2011-11-02: 10 mL via INTRAVENOUS
  Filled 2011-11-02: qty 10

## 2011-12-14 ENCOUNTER — Ambulatory Visit (HOSPITAL_BASED_OUTPATIENT_CLINIC_OR_DEPARTMENT_OTHER): Payer: Medicare Other

## 2011-12-14 VITALS — BP 178/85 | HR 83 | Temp 97.0°F

## 2011-12-14 DIAGNOSIS — Z452 Encounter for adjustment and management of vascular access device: Secondary | ICD-10-CM

## 2011-12-14 DIAGNOSIS — C8290 Follicular lymphoma, unspecified, unspecified site: Secondary | ICD-10-CM

## 2011-12-14 DIAGNOSIS — C96A Histiocytic sarcoma: Secondary | ICD-10-CM

## 2011-12-14 MED ORDER — SODIUM CHLORIDE 0.9 % IJ SOLN
10.0000 mL | INTRAMUSCULAR | Status: DC | PRN
  Administered 2011-12-14: 10 mL via INTRAVENOUS
  Filled 2011-12-14: qty 10

## 2011-12-14 MED ORDER — HEPARIN SOD (PORK) LOCK FLUSH 100 UNIT/ML IV SOLN
500.0000 [IU] | Freq: Once | INTRAVENOUS | Status: AC
  Administered 2011-12-14: 500 [IU] via INTRAVENOUS
  Filled 2011-12-14: qty 5

## 2012-01-25 ENCOUNTER — Ambulatory Visit (HOSPITAL_BASED_OUTPATIENT_CLINIC_OR_DEPARTMENT_OTHER): Payer: Medicare Other

## 2012-01-25 VITALS — BP 144/87 | HR 82 | Temp 97.3°F

## 2012-01-25 DIAGNOSIS — C8589 Other specified types of non-Hodgkin lymphoma, extranodal and solid organ sites: Secondary | ICD-10-CM

## 2012-01-25 DIAGNOSIS — C8590 Non-Hodgkin lymphoma, unspecified, unspecified site: Secondary | ICD-10-CM

## 2012-01-25 DIAGNOSIS — Z452 Encounter for adjustment and management of vascular access device: Secondary | ICD-10-CM

## 2012-01-25 MED ORDER — SODIUM CHLORIDE 0.9 % IJ SOLN
10.0000 mL | INTRAMUSCULAR | Status: DC | PRN
  Administered 2012-01-25: 10 mL via INTRAVENOUS
  Filled 2012-01-25: qty 10

## 2012-01-25 MED ORDER — HEPARIN SOD (PORK) LOCK FLUSH 100 UNIT/ML IV SOLN
500.0000 [IU] | Freq: Once | INTRAVENOUS | Status: AC
  Administered 2012-01-25: 500 [IU] via INTRAVENOUS
  Filled 2012-01-25: qty 5

## 2012-01-25 NOTE — Patient Instructions (Signed)
Call MD with any problems 

## 2012-03-14 ENCOUNTER — Ambulatory Visit (HOSPITAL_BASED_OUTPATIENT_CLINIC_OR_DEPARTMENT_OTHER): Payer: Medicare Other

## 2012-03-14 VITALS — BP 146/81 | HR 80 | Temp 97.4°F

## 2012-03-14 DIAGNOSIS — C8589 Other specified types of non-Hodgkin lymphoma, extranodal and solid organ sites: Secondary | ICD-10-CM

## 2012-03-14 DIAGNOSIS — C8590 Non-Hodgkin lymphoma, unspecified, unspecified site: Secondary | ICD-10-CM

## 2012-03-14 DIAGNOSIS — Z452 Encounter for adjustment and management of vascular access device: Secondary | ICD-10-CM

## 2012-03-14 MED ORDER — SODIUM CHLORIDE 0.9 % IJ SOLN
10.0000 mL | INTRAMUSCULAR | Status: DC | PRN
  Administered 2012-03-14: 10 mL via INTRAVENOUS
  Filled 2012-03-14: qty 10

## 2012-03-14 MED ORDER — HEPARIN SOD (PORK) LOCK FLUSH 100 UNIT/ML IV SOLN
500.0000 [IU] | Freq: Once | INTRAVENOUS | Status: AC
  Administered 2012-03-14: 500 [IU] via INTRAVENOUS
  Filled 2012-03-14: qty 5

## 2012-03-14 NOTE — Patient Instructions (Signed)
Call MD for problems 

## 2012-04-25 ENCOUNTER — Ambulatory Visit (HOSPITAL_BASED_OUTPATIENT_CLINIC_OR_DEPARTMENT_OTHER): Payer: Medicare Other

## 2012-04-25 VITALS — BP 141/81 | HR 79 | Temp 97.1°F

## 2012-04-25 DIAGNOSIS — C8590 Non-Hodgkin lymphoma, unspecified, unspecified site: Secondary | ICD-10-CM

## 2012-04-25 DIAGNOSIS — C8589 Other specified types of non-Hodgkin lymphoma, extranodal and solid organ sites: Secondary | ICD-10-CM

## 2012-04-25 DIAGNOSIS — Z452 Encounter for adjustment and management of vascular access device: Secondary | ICD-10-CM

## 2012-04-25 MED ORDER — HEPARIN SOD (PORK) LOCK FLUSH 100 UNIT/ML IV SOLN
500.0000 [IU] | Freq: Once | INTRAVENOUS | Status: AC
  Administered 2012-04-25: 500 [IU] via INTRAVENOUS
  Filled 2012-04-25: qty 5

## 2012-04-25 MED ORDER — SODIUM CHLORIDE 0.9 % IJ SOLN
10.0000 mL | INTRAMUSCULAR | Status: DC | PRN
  Administered 2012-04-25: 10 mL via INTRAVENOUS
  Filled 2012-04-25: qty 10

## 2012-04-25 NOTE — Patient Instructions (Signed)
Call MD for any problems 

## 2012-05-29 ENCOUNTER — Telehealth: Payer: Self-pay | Admitting: Hematology and Oncology

## 2012-05-29 NOTE — Telephone Encounter (Signed)
Moved 9/24 appt to 9/26 due to LO out. Per LO just move to another day. S/w pt re change w/new d/t. Also confirmed 9/19 lb/scan.

## 2012-06-03 ENCOUNTER — Other Ambulatory Visit: Payer: Self-pay | Admitting: *Deleted

## 2012-06-06 ENCOUNTER — Other Ambulatory Visit (HOSPITAL_BASED_OUTPATIENT_CLINIC_OR_DEPARTMENT_OTHER): Payer: Medicare Other

## 2012-06-06 ENCOUNTER — Encounter (HOSPITAL_COMMUNITY)
Admission: RE | Admit: 2012-06-06 | Discharge: 2012-06-06 | Disposition: A | Discharge status: Home / Self Care | Payer: Medicare Other | Source: Ambulatory Visit | Attending: Hematology and Oncology | Admitting: Hematology and Oncology

## 2012-06-06 DIAGNOSIS — C8589 Other specified types of non-Hodgkin lymphoma, extranodal and solid organ sites: Secondary | ICD-10-CM | POA: Insufficient documentation

## 2012-06-06 DIAGNOSIS — C259 Malignant neoplasm of pancreas, unspecified: Secondary | ICD-10-CM

## 2012-06-06 DIAGNOSIS — C8590 Non-Hodgkin lymphoma, unspecified, unspecified site: Secondary | ICD-10-CM

## 2012-06-06 LAB — CBC WITH DIFFERENTIAL/PLATELET
BASO%: 0.2 % (ref 0.0–2.0)
LYMPH%: 7.9 % — ABNORMAL LOW (ref 14.0–49.0)
MCHC: 33.3 g/dL (ref 32.0–36.0)
MCV: 89.1 fL (ref 79.3–98.0)
MONO%: 0.7 % (ref 0.0–14.0)
Platelets: 146 10*3/uL (ref 140–400)
RBC: 5.5 10*6/uL (ref 4.20–5.82)

## 2012-06-06 LAB — COMPREHENSIVE METABOLIC PANEL (CC13)
ALT: 18 U/L (ref 0–55)
Alkaline Phosphatase: 66 U/L (ref 40–150)
Glucose: 154 mg/dl — ABNORMAL HIGH (ref 70–99)
Sodium: 138 mEq/L (ref 136–145)
Total Bilirubin: 1.4 mg/dL — ABNORMAL HIGH (ref 0.20–1.20)
Total Protein: 7.5 g/dL (ref 6.4–8.3)

## 2012-06-06 MED ORDER — IOHEXOL 300 MG/ML  SOLN
100.0000 mL | Freq: Once | INTRAMUSCULAR | Status: AC | PRN
  Administered 2012-06-06: 100 mL via INTRAVENOUS

## 2012-06-11 ENCOUNTER — Ambulatory Visit: Payer: Medicare Other | Admitting: Hematology and Oncology

## 2012-06-13 ENCOUNTER — Ambulatory Visit (HOSPITAL_BASED_OUTPATIENT_CLINIC_OR_DEPARTMENT_OTHER): Payer: Medicare Other | Admitting: Hematology and Oncology

## 2012-06-13 ENCOUNTER — Encounter: Payer: Self-pay | Admitting: Hematology and Oncology

## 2012-06-13 ENCOUNTER — Telehealth: Payer: Self-pay | Admitting: Hematology and Oncology

## 2012-06-13 VITALS — BP 137/87 | HR 83 | Temp 97.7°F | Resp 18 | Ht 69.0 in | Wt 176.7 lb

## 2012-06-13 DIAGNOSIS — C8589 Other specified types of non-Hodgkin lymphoma, extranodal and solid organ sites: Secondary | ICD-10-CM

## 2012-06-13 DIAGNOSIS — C8590 Non-Hodgkin lymphoma, unspecified, unspecified site: Secondary | ICD-10-CM

## 2012-06-13 NOTE — Progress Notes (Signed)
This office note has been dictated.

## 2012-06-13 NOTE — Telephone Encounter (Signed)
appts mdae printed for pt aom

## 2012-06-13 NOTE — Progress Notes (Signed)
CC:   [name], M.D. Graylin Shiver, M.D.  IDENTIFYING STATEMENT:  The patient is a 71 year old man with non- Hodgkin lymphoma who presents for followup.  INTERVAL HISTORY:  The patient was seen 9 months ago.  Has no significant symptoms.  Has no fevers, chills, night sweats, or weight loss.  Denies adenopathy.  Continues to tolerate fair energy levels.  The patient received staging CTs of the chest, abdomen and pelvis on 06/06/2012.  Essentially the scans indicated no evidence of recurrence. There was a subpleural nodule in the left oblique fissure remained unchanged from priors scans with no new or suspicious nodules within the lungs.  The esophageal wall was a little thickened and was felt to represent esophagitis but the patient is asymptomatic.  Thickening noted at the root of the mesentery remained stable when compared to prior scans and consistent with treated lymphoma.  MEDICATIONS:  Reviewed and updated.  ALLERGIES:  Up dated.  REVIEW OF SYSTEMS:  10-point review of systems negative.  PHYSICAL EXAMINATION:  The patient is a well-appearing, well-nourished man in no distress.  Vitals:  Pulse 83, blood pressure 137/87, temperature 97.7, respirations 18, weight 176 pounds.  HEENT:  Head is atraumatic, normocephalic.  Sclerae anicteric.  Mouth moist.  Neck: Supple.  Chest:  Clear to percussion and auscultation.  Port:  No signs infection.  CVS:  Unremarkable.  Abdomen:  Soft, nontender.  Bowel sounds present.  Lymph nodes:  No adenopathy.  CNS:  Nonfocal.  LABORATORY DATA:  06/06/2012 white cell count 8.2, hemoglobin 16.3, hematocrit 49, platelets 146.  Sodium 138, potassium 4.4, chloride 104, CO2 is 22, BUN 20, creatinine 1.3, glucose 154, T-bilirubin 1.4, alkaline phosphatase 66, AST 17, ALT 18.  Results of CTs are as in interval history.  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  He received 1 cycle of  Rituxan with cyclophosphamide,  Adriamycin, Vincristine and prednisone at the [facility] in [place] and then went off to [place] to begin holistic therapy.  There he received abbreviated doses of Rituxan plus CHOP with whole-body hypothermia with holistic therapy.  He then returned to the [place] and received Rituxan infusions and was last given a dose on April 24, 2008.  His current CTs and blood work indicate no evidence of recurrence.  He will continue surveillance and follows up in years time with labs.  In the interim, if he has any issues concerns not to hesitate to contact us. Needs port flushes every six to eight weeks.  ______________________________ Laurice Record, M.D. LIO/MEDQ  D:  06/13/2012  T:  06/13/2012  Job:  [id number]

## 2012-06-13 NOTE — Patient Instructions (Signed)
[name] [name]  [id number]   [facility] - AFTER VISIT SUMMARY   **RECOMMENDATIONS MADE BY THE CONSULTANT AND ANY TEST    RESULTS WILL BE SENT TO YOUR REFERRING DOCTORS.   YOUR EXAM FINDINGS, LABS AND RESULTS WERE DISCUSSED BY YOUR MD TODAY.  YOU CAN GO TO THE [facility] WEB SITE FOR INSTRUCTIONS ON HOW TO ASSESS MY CHART FOR ADDITIONAL INFORMATION AS NEEDED.  Your Updated drug allergies are: Allergies as of 06/13/2012 - Review Complete 06/13/2012  Allergen Reaction Noted  . Iohexol  08/02/2007  . Sulfa antibiotics  09/18/2011    Your current list of medications are: Current Outpatient Prescriptions  Medication Sig Dispense Refill  . Aflibercept (EYLEA) 2 MG/0.05ML SOLN Inject into the eye as directed. Take every 6 wks in eye doctor office.      . Ascorbic Acid (VITAMIN C) 1000 MG tablet Take 2,000 mg by mouth daily.        . Cholecalciferol 5000 UNITS TABS Take 5,000 Units by mouth daily.       Marland Kitchen co-enzyme Q-10 30 MG capsule Take 30 mg by mouth daily.        Marland Kitchen DHEA 50 MG CAPS Take 1 capsule by mouth daily.        Marland Kitchen lisinopril-hydrochlorothiazide (PRINZIDE,ZESTORETIC) 20-12.5 MG per tablet Take 1.5 tablets by mouth daily.        . Multiple Vitamin (MULTIVITAMIN) tablet Take 6 tablets by mouth daily.        . Probiotic Product (PROBIOTIC FORMULA PO) Take 1 tablet by mouth daily.        Marland Kitchen selenium 50 MCG TABS Take 200 mcg by mouth daily.      Marland Kitchen tobramycin (TOBREX) 0.3 % ophthalmic solution       . UNABLE TO FIND Take 2 capsules by mouth daily. Med Name: Alphaliopic Acid       . UNABLE TO FIND Take 2 capsules by mouth daily. Med Name:curcumin       . UNABLE TO FIND Take 2 capsules by mouth daily. Med Name:Hepa Trope II       . UNABLE TO FIND Take 2 capsules by mouth daily. Med Name:Quercetin       . UNABLE TO FIND Take 1 tablet by mouth daily. Med Name:Tumeric          INSTRUCTIONS GIVEN AND DISCUSSED:  See attached schedule   SPECIAL INSTRUCTIONS/FOLLOW-UP:  See  above.  I acknowledge that I have been informed and understand all the instructions given to me and received a copy.I know to contact the clinic, my physician, or go to the emergency Department if any problems should occur.   I do not have any more questions at this time, but understand that I may call the [facility] at [phone] during business hours should I have any further questions or need assistance in obtaining follow-up care.

## 2012-07-16 ENCOUNTER — Ambulatory Visit (HOSPITAL_BASED_OUTPATIENT_CLINIC_OR_DEPARTMENT_OTHER): Payer: Medicare Other

## 2012-07-16 VITALS — BP 140/78 | HR 72 | Temp 97.7°F

## 2012-07-16 DIAGNOSIS — C8589 Other specified types of non-Hodgkin lymphoma, extranodal and solid organ sites: Secondary | ICD-10-CM

## 2012-07-16 DIAGNOSIS — C8590 Non-Hodgkin lymphoma, unspecified, unspecified site: Secondary | ICD-10-CM

## 2012-07-16 DIAGNOSIS — Z452 Encounter for adjustment and management of vascular access device: Secondary | ICD-10-CM

## 2012-07-16 MED ORDER — SODIUM CHLORIDE 0.9 % IJ SOLN
10.0000 mL | INTRAMUSCULAR | Status: DC | PRN
  Administered 2012-07-16: 10 mL via INTRAVENOUS
  Filled 2012-07-16: qty 10

## 2012-07-16 MED ORDER — HEPARIN SOD (PORK) LOCK FLUSH 100 UNIT/ML IV SOLN
500.0000 [IU] | Freq: Once | INTRAVENOUS | Status: AC
  Administered 2012-07-16: 500 [IU] via INTRAVENOUS
  Filled 2012-07-16: qty 5

## 2012-08-27 ENCOUNTER — Ambulatory Visit (HOSPITAL_BASED_OUTPATIENT_CLINIC_OR_DEPARTMENT_OTHER): Payer: Medicare Other

## 2012-08-27 VITALS — BP 128/86 | HR 98 | Temp 97.7°F

## 2012-08-27 DIAGNOSIS — C8589 Other specified types of non-Hodgkin lymphoma, extranodal and solid organ sites: Secondary | ICD-10-CM

## 2012-08-27 DIAGNOSIS — Z452 Encounter for adjustment and management of vascular access device: Secondary | ICD-10-CM

## 2012-08-27 DIAGNOSIS — C8590 Non-Hodgkin lymphoma, unspecified, unspecified site: Secondary | ICD-10-CM

## 2012-08-27 MED ORDER — HEPARIN SOD (PORK) LOCK FLUSH 100 UNIT/ML IV SOLN
500.0000 [IU] | Freq: Once | INTRAVENOUS | Status: AC
  Administered 2012-08-27: 500 [IU] via INTRAVENOUS
  Filled 2012-08-27: qty 5

## 2012-08-27 MED ORDER — SODIUM CHLORIDE 0.9 % IJ SOLN
10.0000 mL | INTRAMUSCULAR | Status: DC | PRN
  Administered 2012-08-27: 10 mL via INTRAVENOUS
  Filled 2012-08-27: qty 10

## 2012-10-08 ENCOUNTER — Ambulatory Visit (HOSPITAL_BASED_OUTPATIENT_CLINIC_OR_DEPARTMENT_OTHER): Payer: Self-pay

## 2012-10-08 ENCOUNTER — Telehealth: Payer: Self-pay | Admitting: *Deleted

## 2012-10-08 VITALS — BP 154/86 | HR 83 | Temp 96.7°F

## 2012-10-08 DIAGNOSIS — Z452 Encounter for adjustment and management of vascular access device: Secondary | ICD-10-CM

## 2012-10-08 DIAGNOSIS — C8590 Non-Hodgkin lymphoma, unspecified, unspecified site: Secondary | ICD-10-CM

## 2012-10-08 DIAGNOSIS — C8589 Other specified types of non-Hodgkin lymphoma, extranodal and solid organ sites: Secondary | ICD-10-CM

## 2012-10-08 MED ORDER — SODIUM CHLORIDE 0.9 % IJ SOLN
10.0000 mL | INTRAMUSCULAR | Status: DC | PRN
  Administered 2012-10-08: 10 mL via INTRAVENOUS
  Filled 2012-10-08: qty 10

## 2012-10-08 MED ORDER — HEPARIN SOD (PORK) LOCK FLUSH 100 UNIT/ML IV SOLN
500.0000 [IU] | Freq: Once | INTRAVENOUS | Status: AC
  Administered 2012-10-08: 500 [IU] via INTRAVENOUS
  Filled 2012-10-08: qty 5

## 2012-10-08 NOTE — Telephone Encounter (Signed)
Patient comes today for a flush appt. Patient is a former Dr. Dalene [name] patient. The patient will be reassigned to Dr. Gaylyn Rong per Efraim Kaufmann.  JMW

## 2012-10-16 ENCOUNTER — Telehealth: Payer: Self-pay | Admitting: Oncology

## 2012-10-16 NOTE — Telephone Encounter (Signed)
Per email response from [place] proceed with getting pt on schedule w/DM. Pt wanted to be able to meet w/new provider. S/w pt today re appt w/DM for 3/6 @ 10am. Pt will keep 3/4 flush appt as scheduled.

## 2012-11-03 ENCOUNTER — Emergency Department (HOSPITAL_COMMUNITY)
Admission: EM | Admit: 2012-11-03 | Discharge: 2012-11-03 | Disposition: A | Discharge status: Home / Self Care | Payer: Medicare Other | Attending: Emergency Medicine | Admitting: Emergency Medicine

## 2012-11-03 ENCOUNTER — Emergency Department (HOSPITAL_COMMUNITY): Payer: Medicare Other

## 2012-11-03 ENCOUNTER — Encounter (HOSPITAL_COMMUNITY): Payer: Self-pay | Admitting: Emergency Medicine

## 2012-11-03 DIAGNOSIS — S02109A Fracture of base of skull, unspecified side, initial encounter for closed fracture: Secondary | ICD-10-CM | POA: Insufficient documentation

## 2012-11-03 DIAGNOSIS — Z9889 Other specified postprocedural states: Secondary | ICD-10-CM | POA: Insufficient documentation

## 2012-11-03 DIAGNOSIS — Z87898 Personal history of other specified conditions: Secondary | ICD-10-CM | POA: Insufficient documentation

## 2012-11-03 DIAGNOSIS — S298XXA Other specified injuries of thorax, initial encounter: Secondary | ICD-10-CM | POA: Insufficient documentation

## 2012-11-03 DIAGNOSIS — S1093XA Contusion of unspecified part of neck, initial encounter: Secondary | ICD-10-CM | POA: Insufficient documentation

## 2012-11-03 DIAGNOSIS — Y929 Unspecified place or not applicable: Secondary | ICD-10-CM | POA: Insufficient documentation

## 2012-11-03 DIAGNOSIS — S0003XA Contusion of scalp, initial encounter: Secondary | ICD-10-CM | POA: Insufficient documentation

## 2012-11-03 DIAGNOSIS — Z8509 Personal history of malignant neoplasm of other digestive organs: Secondary | ICD-10-CM | POA: Insufficient documentation

## 2012-11-03 DIAGNOSIS — I10 Essential (primary) hypertension: Secondary | ICD-10-CM | POA: Insufficient documentation

## 2012-11-03 DIAGNOSIS — S0292XA Unspecified fracture of facial bones, initial encounter for closed fracture: Secondary | ICD-10-CM

## 2012-11-03 DIAGNOSIS — Y9329 Activity, other involving ice and snow: Secondary | ICD-10-CM | POA: Insufficient documentation

## 2012-11-03 DIAGNOSIS — S0083XA Contusion of other part of head, initial encounter: Secondary | ICD-10-CM

## 2012-11-03 DIAGNOSIS — W010XXA Fall on same level from slipping, tripping and stumbling without subsequent striking against object, initial encounter: Secondary | ICD-10-CM | POA: Insufficient documentation

## 2012-11-03 DIAGNOSIS — Z79899 Other long term (current) drug therapy: Secondary | ICD-10-CM | POA: Insufficient documentation

## 2012-11-03 DIAGNOSIS — Z8781 Personal history of (healed) traumatic fracture: Secondary | ICD-10-CM | POA: Insufficient documentation

## 2012-11-03 MED ORDER — AMOXICILLIN-POT CLAVULANATE 875-125 MG PO TABS
1.0000 | ORAL_TABLET | Freq: Once | ORAL | Status: AC
  Administered 2012-11-03: 1 via ORAL
  Filled 2012-11-03: qty 1

## 2012-11-03 MED ORDER — PROMETHAZINE HCL 25 MG PO TABS: 25.0000 mg | ORAL_TABLET | Freq: Four times a day (QID) | ORAL | Status: DC | PRN

## 2012-11-03 MED ORDER — OXYCODONE-ACETAMINOPHEN 5-325 MG PO TABS
1.0000 | ORAL_TABLET | Freq: Once | ORAL | Status: AC
  Administered 2012-11-03: 1 via ORAL
  Filled 2012-11-03: qty 1

## 2012-11-03 MED ORDER — AMOXICILLIN-POT CLAVULANATE 875-125 MG PO TABS: 1.0000 | ORAL_TABLET | Freq: Two times a day (BID) | ORAL | Status: DC

## 2012-11-03 MED ORDER — TRAMADOL HCL 50 MG PO TABS
50.0000 mg | ORAL_TABLET | Freq: Once | ORAL | Status: DC
  Filled 2012-11-03: qty 1

## 2012-11-03 MED ORDER — OXYCODONE-ACETAMINOPHEN 5-325 MG PO TABS: 1.0000 | ORAL_TABLET | ORAL | Status: DC | PRN

## 2012-11-03 NOTE — ED Notes (Signed)
Pt slipped and fell onto concrete hitting face. Pt denies +LOC or taking blood thinners. Pt presents with abrasions to left side of face above left eyebrow, left check and left side of chin. Pt has ice from home.

## 2012-11-03 NOTE — ED Provider Notes (Signed)
History     CSN: [id number]  Arrival date & time 11/03/12  4098   First MD Initiated Contact with Patient 11/03/12 [phone]      Chief Complaint  Patient presents with  . Fall  . Head Injury    (Consider location/radiation/quality/duration/timing/severity/associated sxs/prior treatment) HPI Comments: [name] [name] is a 72 y.o. Male presenting for evaluation of facial injury sustained when he slipped on ice,  Falling and landing on his left face and forehead.  There was no loss of consciousness for the event.  He has sustained abrasions to his left eyebrow and maxilla.  He has applied ice to the area prior to arrival with no significant improvement in symptoms.  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 He wears glasses which were significantly damaged without glass breakage during the fall. He denies nausea or emesis.  He does have left lateral point tender rib pain which is worse with palpation and when he twists his torso to the fall.  He denies shortness of breath.  He is not on any blood thinners .  His tetanus is up-to-date.     Patient is a 72 y.o. male presenting with head injury. The history is provided by the patient and the spouse.  Head Injury Associated symptoms: no headaches, no nausea, no neck pain and no numbness     Past Medical History  Diagnosis Date  . Hypertension   . Femur fracture   . Hip fracture   . Other malignant lymphomas, unspecified site, extranodal and solid organ sites   . Malignant neoplasm of pancreas, part unspecified     Past Surgical History  Procedure Laterality Date  . Femur surgery Bilateral   . Shoulder surgery Left   . Leg surgery Left     No family history on file.  History  Substance Use Topics  . Smoking status: Never Smoker   . Smokeless tobacco: Not on file  . Alcohol Use: No      Review of Systems   Constitutional: Negative for fever.  HENT: Positive for facial swelling. Negative for congestion, sore throat and neck pain.   Eyes: Negative.  Negative for visual disturbance.  Respiratory: Negative for cough, chest tightness and shortness of breath.   Cardiovascular: Positive for chest pain.  Gastrointestinal: Negative for nausea and abdominal pain.  Genitourinary: Negative.   Musculoskeletal: Negative for joint swelling and arthralgias.  Skin: Positive for wound. Negative for rash.  Neurological: Negative for dizziness, weakness, light-headedness, numbness and headaches.  Psychiatric/Behavioral: Negative.     Allergies  Iohexol and Sulfa antibiotics  Home Medications   Current Outpatient Rx  Name  Route  Sig  Dispense  Refill  . Ascorbic Acid (VITAMIN C) 1000 MG tablet   Oral   Take 2,000 mg by mouth daily.           Marland Kitchen lisinopril-hydrochlorothiazide (PRINZIDE,ZESTORETIC) 20-12.5 MG per tablet   Oral   Take 2 tablets by mouth daily.          . Multiple Vitamin (MULTIVITAMIN) tablet   Oral   Take 6 tablets by mouth daily.           . Probiotic Product (PROBIOTIC FORMULA PO)   Oral   Take 1 tablet by mouth daily.           Marland Kitchen UNABLE TO FIND   Oral   Take  1 capsule by mouth daily. Med Name:curcumin         . Aflibercept (EYLEA) 2 MG/0.05ML SOLN   Intraocular   Inject into the eye as directed. Take every 6 wks in eye doctor office.         Marland Kitchen amoxicillin-clavulanate (AUGMENTIN) 875-125 MG per tablet   Oral   Take 1 tablet by mouth every 12 (twelve) hours.   20 tablet   0   . oxyCODONE-acetaminophen (PERCOCET/ROXICET) 5-325 MG per tablet   Oral   Take 1 tablet by mouth every 4 (four) hours as needed for pain.   30 tablet   0   . promethazine (PHENERGAN) 25 MG tablet   Oral   Take 1 tablet (25 mg total) by mouth every 6 (six) hours as needed for nausea.   12 tablet   0     BP 161/88  Pulse 72  Resp 16  SpO2 98%  Physical Exam  Nursing note  and vitals reviewed. Constitutional: He is oriented to person, place, and time. He appears well-developed and well-nourished.  HENT:  Head: Normocephalic. Head is with contusion. Head is without raccoon's eyes and without Battle's sign.  Right Ear: No hemotympanum.  Left Ear: No hemotympanum.  Nose: Nose normal.  Mouth/Throat: Uvula is midline and oropharynx is clear and moist.  Contusion of left eyebrow and upper eyelid area with abrasion and moderate bruising.  Small linear abrasion left maxilla and left lateral chin.  Non tender along mandible, nose and bilateral maxilla.   Eyes: Conjunctivae and EOM are normal. Pupils are equal, round, and reactive to light. Left conjunctiva is not injected. Left conjunctiva has no hemorrhage.  Neck: Normal range of motion. Neck supple. No spinous process tenderness and no muscular tenderness present. No edema and normal range of motion present.  Cardiovascular: Normal rate, regular rhythm, normal heart sounds and intact distal pulses.   Pulmonary/Chest: Effort normal and breath sounds normal. He has no wheezes.  Abdominal: Soft. Bowel sounds are normal. There is no tenderness.  Musculoskeletal: Normal range of motion.  Neurological: He is alert and oriented to person, place, and time. No cranial nerve deficit. Coordination normal.  Equal grip strength.  Skin: Skin is warm and dry. Abrasion noted.  Psychiatric: He has a normal mood and affect.    ED Course  Procedures (including critical care time)  Labs Reviewed - No data to display Ct Head Wo Contrast  11/03/2012  *RADIOLOGY REPORT*  Clinical Data:  72 year old male with head and facial injury, headache, facial pain and swelling following fall. History of lymphoma.  CT HEAD WITHOUT CONTRAST CT MAXILLOFACIAL WITHOUT CONTRAST  Technique:  Multidetector CT imaging of the head and maxillofacial structures were performed using the standard protocol without intravenous contrast. Multiplanar CT image  reconstructions of the maxillofacial structures were also generated.  Comparison:  None  CT HEAD  Findings: No acute intracranial abnormalities are identified, including mass lesion or mass effect, hydrocephalus, extra-axial fluid collection, midline shift, hemorrhage, or acute infarction.  There are fractures of the right frontal sinus and left orbital roof and left frontal sinus.  Overlying soft tissue swelling is noted.  Left preseptal facial hematoma is noted.  IMPRESSION: No evidence of acute intracranial abnormality.  Fractures of the right frontal sinus and left orbital roof and left frontal sinus with overlying soft tissue swelling/hematoma.  CT MAXILLOFACIAL  Findings:   There is a nondisplaced fracture of the anterior medial left orbital roof, a 2 mm depressed fracture  of the left frontal sinus and a 1 mm depressed fracture of the right frontal sinus.  A small amount of blood within the left frontal sinus is noted. Left preseptal soft tissue swelling/hematoma is noted. The globes retain their spherical shape. There is no evidence of postseptal or intraconal abnormality.  The remainder of the paranasal sinuses is clear. The mastoid air cells and middle/inner ears are clear. No focal bony lesions are noted.  IMPRESSION: Mildly depressed fractures of the frontal sinuses bilaterally.  Fracture of the medial anterior left orbital roof.  Left preseptal soft tissue swelling/hematoma.   Original Report Authenticated By: Harmon Pier, M.D.    Ct Maxillofacial Wo Cm  11/03/2012  *RADIOLOGY REPORT*  Clinical Data:  72 year old male with head and facial injury, headache, facial pain and swelling following fall. History of lymphoma.  CT HEAD WITHOUT CONTRAST CT MAXILLOFACIAL WITHOUT CONTRAST  Technique:  Multidetector CT imaging of the head and maxillofacial structures were performed using the standard protocol without intravenous contrast. Multiplanar CT image reconstructions of the maxillofacial structures were also  generated.  Comparison:  None  CT HEAD  Findings: No acute intracranial abnormalities are identified, including mass lesion or mass effect, hydrocephalus, extra-axial fluid collection, midline shift, hemorrhage, or acute infarction.  There are fractures of the right frontal sinus and left orbital roof and left frontal sinus.  Overlying soft tissue swelling is noted.  Left preseptal facial hematoma is noted.  IMPRESSION: No evidence of acute intracranial abnormality.  Fractures of the right frontal sinus and left orbital roof and left frontal sinus with overlying soft tissue swelling/hematoma.  CT MAXILLOFACIAL  Findings:   There is a nondisplaced fracture of the anterior medial left orbital roof, a 2 mm depressed fracture of the left frontal sinus and a 1 mm depressed fracture of the right frontal sinus.  A small amount of blood within the left frontal sinus is noted. Left preseptal soft tissue swelling/hematoma is noted. The globes retain their spherical shape. There is no evidence of postseptal or intraconal abnormality.  The remainder of the paranasal sinuses is clear. The mastoid air cells and middle/inner ears are clear. No focal bony lesions are noted.  IMPRESSION: Mildly depressed fractures of the frontal sinuses bilaterally.  Fracture of the medial anterior left orbital roof.  Left preseptal soft tissue swelling/hematoma.   Original Report Authenticated By: Harmon Pier, M.D.      1. Facial contusion, initial encounter   2. Multiple facial fractures, closed, initial encounter    Pt has ambulated in dept without difficulty.   MDM  Discussed case with Dr. Annalee Genta,  ENT.  Recommends placing patient on Augmentin,  Can use saline nasal spray,  Avoid blowing nose, sneezing if possible.  Ice,  Sleep elevated for the next several nights.  Pt to call for appointment on Friday (in 5 days) after swelling is improved.  Percocet prescribed for pain, phenergan also prescribed as pt had mild nausea from  percocet.  Also recommend recheck by opthalmology - pt sees Dr. Nelle Don -he will call for appt.    He is utd on tetanus.  Pt was seen by attending prior to dc home.        Burgess Amor, [place] 11/04/12 [phone]

## 2012-11-04 NOTE — ED Provider Notes (Signed)
Medical screening examination/treatment/procedure(s) were performed by non-physician practitioner and as supervising physician I was immediately available for consultation/collaboration.  Keondrick Dilks T Teniyah Seivert, MD 11/04/12 0701 

## 2012-11-09 ENCOUNTER — Telehealth: Payer: Self-pay | Admitting: Oncology

## 2012-11-09 ENCOUNTER — Encounter: Payer: Self-pay | Admitting: Oncology

## 2012-11-19 ENCOUNTER — Telehealth: Payer: Self-pay | Admitting: *Deleted

## 2012-11-19 NOTE — Telephone Encounter (Signed)
Patient called and moved his flush appt from today to Thursday.   JMW

## 2012-11-20 ENCOUNTER — Other Ambulatory Visit: Payer: Self-pay | Admitting: Oncology

## 2012-11-21 ENCOUNTER — Ambulatory Visit (HOSPITAL_BASED_OUTPATIENT_CLINIC_OR_DEPARTMENT_OTHER): Payer: Medicare Other | Admitting: Oncology

## 2012-11-21 ENCOUNTER — Other Ambulatory Visit (HOSPITAL_BASED_OUTPATIENT_CLINIC_OR_DEPARTMENT_OTHER): Payer: Medicare Other | Admitting: Lab

## 2012-11-21 ENCOUNTER — Other Ambulatory Visit: Payer: Self-pay | Admitting: Medical Oncology

## 2012-11-21 ENCOUNTER — Ambulatory Visit: Payer: Medicare Other

## 2012-11-21 ENCOUNTER — Encounter: Payer: Self-pay | Admitting: Oncology

## 2012-11-21 VITALS — BP 132/88 | HR 93 | Temp 97.5°F | Resp 20 | Ht 69.0 in | Wt 182.4 lb

## 2012-11-21 VITALS — BP 132/88 | HR 93 | Temp 97.5°F | Resp 20

## 2012-11-21 DIAGNOSIS — E559 Vitamin D deficiency, unspecified: Secondary | ICD-10-CM

## 2012-11-21 DIAGNOSIS — C8589 Other specified types of non-Hodgkin lymphoma, extranodal and solid organ sites: Secondary | ICD-10-CM

## 2012-11-21 DIAGNOSIS — I10 Essential (primary) hypertension: Secondary | ICD-10-CM

## 2012-11-21 DIAGNOSIS — C8590 Non-Hodgkin lymphoma, unspecified, unspecified site: Secondary | ICD-10-CM

## 2012-11-21 LAB — CBC WITH DIFFERENTIAL/PLATELET
Basophils Absolute: 0 10*3/uL (ref 0.0–0.1)
Eosinophils Absolute: 0.2 10*3/uL (ref 0.0–0.5)
HCT: 46.4 % (ref 38.4–49.9)
HGB: 15.4 g/dL (ref 13.0–17.1)
LYMPH%: 25 % (ref 14.0–49.0)
MCV: 86.6 fL (ref 79.3–98.0)
MONO%: 7.1 % (ref 0.0–14.0)
NEUT#: 3.6 10*3/uL (ref 1.5–6.5)
Platelets: 151 10*3/uL (ref 140–400)
RDW: 14 % (ref 11.0–14.6)

## 2012-11-21 LAB — COMPREHENSIVE METABOLIC PANEL (CC13)
ALT: 49 U/L (ref 0–55)
CO2: 25 mEq/L (ref 22–29)
Calcium: 10 mg/dL (ref 8.4–10.4)
Chloride: 103 mEq/L (ref 98–107)
Creatinine: 1.2 mg/dL (ref 0.7–1.3)

## 2012-11-21 LAB — LACTATE DEHYDROGENASE (CC13): LDH: 216 U/L (ref 125–245)

## 2012-11-21 MED ORDER — HEPARIN SOD (PORK) LOCK FLUSH 100 UNIT/ML IV SOLN
500.0000 [IU] | Freq: Once | INTRAVENOUS | Status: AC
  Administered 2012-11-21: 500 [IU] via INTRAVENOUS
  Filled 2012-11-21: qty 5

## 2012-11-21 MED ORDER — SODIUM CHLORIDE 0.9 % IJ SOLN
10.0000 mL | INTRAMUSCULAR | Status: DC | PRN
  Administered 2012-11-21: 10 mL via INTRAVENOUS
  Filled 2012-11-21: qty 10

## 2012-11-21 NOTE — Progress Notes (Signed)
This office note has been dictated.  #[id number]

## 2012-11-21 NOTE — Progress Notes (Signed)
CC:   [name] [name], M.D. [name] [name], M.D.  PROBLEM LIST: 1. Diffuse large B cell non-Hodgkin lymphoma, high-grade, presenting     with pain and a large mesenteric mass which measured 8.7 x 6.4 x     7.4 cm.  The initial needle biopsy was negative.  A second needle     biopsy carried out on 10/14/2007 gave the diagnosis.  CD20 and CD7a     were positive.  Bone marrow on 10/31/2007 was negative.  The     patient received 1 cycle of Rituxan, Cytoxan, Adriamycin,     vincristine, and prednisone under the direction of Dr. Vicente Serene     [name].  The patient went to [place] where he received whole body     hyperthermia, insulin potentiation therapy, and then a few cycles     of R-CHOP at 40% of the standard doses.  The patient may have received     additional Rituxan when he came back to the [place].  His     treatments in [place] were concluded in July 2009.  Rituxan     treatments as a single agent apparently were concluded in early     August 2009.  The patient has remained disease-free.  He did have     CT scans of chest, abdomen, and pelvis on 06/06/2012 that were     without evidence of disease. 2. Hypertension. 3. Vitamin D deficiency. 4. 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  The patient is under the care of Dr.     Octavia [name].  He had received Avastin and now is on aflibercept     (Eylea) intraocular injections. 5. Facial and sinus fractures after falling on 11/03/2012.  Management     is conservative. 6. Right-sided Port-A-Cath apparently placed in early 2009.  MEDICATIONS:  Reviewed and recorded. Current Outpatient Prescriptions  Medication Sig Dispense Refill  . Aflibercept (EYLEA) 2 MG/0.05ML SOLN Inject into the eye as directed. Take every 6 wks in eye doctor office.      . Ascorbic Acid (VITAMIN C) 1000 MG tablet Take 2,000 mg by mouth daily.        . cholecalciferol (VITAMIN D) 1000 UNITS tablet Take  5,000 Units by mouth daily.      Marland Kitchen lisinopril-hydrochlorothiazide (PRINZIDE,ZESTORETIC) 20-12.5 MG per tablet Take 2 tablets by mouth daily.       . Multiple Vitamin (MULTIVITAMIN) tablet Take 6 tablets by mouth daily.        . Probiotic Product (PROBIOTIC FORMULA PO) Take 1 tablet by mouth daily.        Marland Kitchen UNABLE TO FIND Take 1 capsule by mouth daily. Med Name:curcumin       No current facility-administered medications for this visit.    SMOKING HISTORY:  Patient has never smoked cigarettes.   HISTORY:  [name] [name] is a 72 year old gentleman who received treatment for a high-grade diffuse large B cell non-Hodgkin lymphoma diagnosed in early 2009.  The patient is in complete remission as evidenced by CT scans carried out on 06/06/2012.  [name] history and medical problems are outlined above.  The patient was last seen here by Dr. Dalene [name] on 06/13/2012.  The patient requested an appointment earlier than the one that had been scheduled for him, which is early October 2014.  He recently fell on some ice and suffered some facial fractures.  That occurred  on 11/03/2012.  The patient is here today with a friend, [name] [name].  He is having some discomfort in his right ear, a pressure-like sensation, but no pain.  He denies any sense of ill health, GI symptoms, fever, chills, night sweats.  Appetite is good and his weight is stable.  In short, there are no symptoms to suggest recurrence of his lymphoma.  No abdominal or other pains.  His facial fractures appear to be healing with conservative nonoperative management.  PHYSICAL EXAMINATION:  General:  The patient looks well.  There is some mild facial asymmetry.  It looks like the left side of the face around the zygomatic arch is slightly less prominent than on the right.  There is some fading ecchymosis in that area.  Weight is 182 pounds 6.4 ounces, height 5 feet 9 inches, body surface area 2.01 sq m.  Vital Signs:  Blood pressure  132/88.  Other vital signs are normal.  HEENT: There is no scleral icterus.  Mouth and pharynx are benign.  Face is as described above.  Lymph nodes:  There is no peripheral adenopathy in the neck, supraclavicular, axillary, or inguinal areas.  Back:  No skeletal tenderness or deformity.  Lungs:  There may have been a few soft inspiratory rales at both bases.  Cardiac:  Regular rhythm without murmur or rub.  Abdomen:  Benign with no organomegaly or masses palpable.  Extremities:  No peripheral edema or clubbing.  Neurologic: Normal.  Access:  Right-sided Port-A-Cath was present.  This was flushed with heparin today and is being flushed every 2 months with heparin.  LABORATORY DATA:  Today, white count 5.7, ANC 3.6, hemoglobin 15.4, hematocrit 46.4, platelets 151,000.  Chemistries were normal except for a glucose of 138.  The patient was made aware of the slightly elevated glucose.  LDH 216, albumin 4.2.  Vitamin D level on 09/15/2011 was 66. TSH on 09/22/2010 was 1.113.  IMAGING STUDIES: 1. CT scan of chest, abdomen, and pelvis with IV contrast from     06/06/2012 showed small subpleural nodules along the left oblique     fissure.  There was mild esophageal wall thickening superior to the     carina, stable, possibly related to esophagitis.  There was no     evidence for recurrent lymphoma in the abdomen.  There was some     thickening at the root of the mesentery, stable compared with prior     studies and consistent with treated lymphoma. 2. CT scan of the head and CT scan of the maxillofacial area without     IV contrast carried out on 11/03/2012 showed no evidence for acute     intracranial abnormality.  There were fractures of the right     frontal sinus and left orbital roof, as well as the left frontal     sinus with overlying soft tissue swelling and hematoma.  There were     mildly depressed fractures of the frontal sinuses bilaterally.     There was a fracture of the medial  anterior left orbital roof and     some left preseptal soft tissue swelling/hematoma.   IMPRESSION AND PLAN:  [name] [name] is out about 5 years from the time of diagnosis without evidence of recurrence.  I reviewed his record in preparation for today's office visit.  We talked about future imaging studies.  I told the patient that at this point, 5 years from the time of diagnosis, that the likelihood of this particular  type of lymphoma recurring is rather small.  I was not enthusiastic about carrying out routine imaging studies unless he had symptoms or some concerns.  The patient was scheduled to have repeat CT scans the end of September.  He has an appointment to see me again on October 2nd.  The patient will think about whether he wants additional scans.  Will continue to flush the Port-A-Cath with heparin every 2 months.  We will see Mr. Chachere again around October 2nd, at which time we will check CBC, chemistries.  A vitamin D level was ordered today at the patient's request.    ______________________________ Samul Dada, M.D. DSM/MEDQ  D:  11/21/2012  T:  11/21/2012  Job:  [id number]

## 2012-11-22 LAB — VITAMIN D 25 HYDROXY (VIT D DEFICIENCY, FRACTURES): Vit D, 25-Hydroxy: 46 ng/mL (ref 30–89)

## 2012-12-31 ENCOUNTER — Ambulatory Visit (HOSPITAL_BASED_OUTPATIENT_CLINIC_OR_DEPARTMENT_OTHER): Payer: Medicare Other

## 2012-12-31 VITALS — BP 142/81 | HR 87 | Temp 98.0°F

## 2012-12-31 DIAGNOSIS — C8590 Non-Hodgkin lymphoma, unspecified, unspecified site: Secondary | ICD-10-CM

## 2012-12-31 DIAGNOSIS — Z452 Encounter for adjustment and management of vascular access device: Secondary | ICD-10-CM

## 2012-12-31 DIAGNOSIS — C8589 Other specified types of non-Hodgkin lymphoma, extranodal and solid organ sites: Secondary | ICD-10-CM

## 2012-12-31 MED ORDER — HEPARIN SOD (PORK) LOCK FLUSH 100 UNIT/ML IV SOLN
500.0000 [IU] | Freq: Once | INTRAVENOUS | Status: AC
  Administered 2012-12-31: 500 [IU] via INTRAVENOUS
  Filled 2012-12-31: qty 5

## 2012-12-31 MED ORDER — SODIUM CHLORIDE 0.9 % IJ SOLN
10.0000 mL | INTRAMUSCULAR | Status: DC | PRN
  Administered 2012-12-31: 10 mL via INTRAVENOUS
  Filled 2012-12-31: qty 10

## 2013-02-11 ENCOUNTER — Other Ambulatory Visit: Payer: Self-pay | Admitting: *Deleted

## 2013-02-11 DIAGNOSIS — C8590 Non-Hodgkin lymphoma, unspecified, unspecified site: Secondary | ICD-10-CM

## 2013-02-11 MED ORDER — HEPARIN SOD (PORK) LOCK FLUSH 100 UNIT/ML IV SOLN
500.0000 [IU] | Freq: Once | INTRAVENOUS | Status: DC
  Filled 2013-02-11: qty 5

## 2013-02-11 MED ORDER — SODIUM CHLORIDE 0.9 % IJ SOLN
10.0000 mL | INTRAMUSCULAR | Status: DC | PRN
  Filled 2013-02-11: qty 10

## 2013-02-18 ENCOUNTER — Telehealth: Payer: Self-pay | Admitting: *Deleted

## 2013-02-18 NOTE — Telephone Encounter (Signed)
Patient called and moved his missed flush appt to Thursday this week.  MW

## 2013-02-20 ENCOUNTER — Ambulatory Visit (HOSPITAL_BASED_OUTPATIENT_CLINIC_OR_DEPARTMENT_OTHER): Payer: Medicare Other

## 2013-02-20 VITALS — BP 131/82 | HR 91 | Temp 97.2°F

## 2013-02-20 DIAGNOSIS — C8589 Other specified types of non-Hodgkin lymphoma, extranodal and solid organ sites: Secondary | ICD-10-CM

## 2013-02-20 DIAGNOSIS — C8590 Non-Hodgkin lymphoma, unspecified, unspecified site: Secondary | ICD-10-CM

## 2013-02-20 DIAGNOSIS — Z452 Encounter for adjustment and management of vascular access device: Secondary | ICD-10-CM

## 2013-02-20 MED ORDER — SODIUM CHLORIDE 0.9 % IJ SOLN
10.0000 mL | INTRAMUSCULAR | Status: DC | PRN
  Administered 2013-02-20: 10 mL via INTRAVENOUS
  Filled 2013-02-20: qty 10

## 2013-02-20 MED ORDER — HEPARIN SOD (PORK) LOCK FLUSH 100 UNIT/ML IV SOLN
500.0000 [IU] | Freq: Once | INTRAVENOUS | Status: AC
  Administered 2013-02-20: 500 [IU] via INTRAVENOUS
  Filled 2013-02-20: qty 5

## 2013-02-24 ENCOUNTER — Other Ambulatory Visit: Payer: Self-pay | Admitting: Family Medicine

## 2013-02-24 ENCOUNTER — Ambulatory Visit
Admission: RE | Admit: 2013-02-24 | Discharge: 2013-02-24 | Disposition: A | Discharge status: Home / Self Care | Payer: Medicare Other | Source: Ambulatory Visit | Attending: Family Medicine | Admitting: Family Medicine

## 2013-02-24 DIAGNOSIS — M2550 Pain in unspecified joint: Secondary | ICD-10-CM

## 2013-03-25 ENCOUNTER — Ambulatory Visit (HOSPITAL_BASED_OUTPATIENT_CLINIC_OR_DEPARTMENT_OTHER): Payer: Medicare Other

## 2013-03-25 VITALS — BP 138/89 | HR 93 | Temp 97.0°F | Resp 20

## 2013-03-25 DIAGNOSIS — C8589 Other specified types of non-Hodgkin lymphoma, extranodal and solid organ sites: Secondary | ICD-10-CM

## 2013-03-25 DIAGNOSIS — Z452 Encounter for adjustment and management of vascular access device: Secondary | ICD-10-CM

## 2013-03-25 DIAGNOSIS — C8590 Non-Hodgkin lymphoma, unspecified, unspecified site: Secondary | ICD-10-CM

## 2013-03-25 MED ORDER — HEPARIN SOD (PORK) LOCK FLUSH 100 UNIT/ML IV SOLN
500.0000 [IU] | Freq: Once | INTRAVENOUS | Status: AC
  Administered 2013-03-25: 500 [IU] via INTRAVENOUS
  Filled 2013-03-25: qty 5

## 2013-03-25 MED ORDER — SODIUM CHLORIDE 0.9 % IJ SOLN
10.0000 mL | INTRAMUSCULAR | Status: DC | PRN
  Administered 2013-03-25: 10 mL via INTRAVENOUS
  Filled 2013-03-25: qty 10

## 2013-03-25 NOTE — Patient Instructions (Signed)
Call MD for problems or concerns 

## 2013-05-06 ENCOUNTER — Ambulatory Visit (HOSPITAL_BASED_OUTPATIENT_CLINIC_OR_DEPARTMENT_OTHER): Payer: Medicare Other

## 2013-05-06 VITALS — BP 149/88 | HR 87 | Temp 97.3°F

## 2013-05-06 DIAGNOSIS — C8590 Non-Hodgkin lymphoma, unspecified, unspecified site: Secondary | ICD-10-CM

## 2013-05-06 DIAGNOSIS — Z452 Encounter for adjustment and management of vascular access device: Secondary | ICD-10-CM

## 2013-05-06 DIAGNOSIS — C8589 Other specified types of non-Hodgkin lymphoma, extranodal and solid organ sites: Secondary | ICD-10-CM

## 2013-05-06 MED ORDER — HEPARIN SOD (PORK) LOCK FLUSH 100 UNIT/ML IV SOLN
500.0000 [IU] | Freq: Once | INTRAVENOUS | Status: AC
  Administered 2013-05-06: 500 [IU] via INTRAVENOUS
  Filled 2013-05-06: qty 5

## 2013-05-06 MED ORDER — SODIUM CHLORIDE 0.9 % IJ SOLN
10.0000 mL | INTRAMUSCULAR | Status: DC | PRN
  Administered 2013-05-06: 10 mL via INTRAVENOUS
  Filled 2013-05-06: qty 10

## 2013-05-06 NOTE — Patient Instructions (Addendum)
Implanted Port Instructions  An implanted port is a central line that has a round shape and is placed under the skin. It is used for long-term IV (intravenous) access for:  · Medicine.  · Fluids.  · Liquid nutrition, such as TPN (total parenteral nutrition).  · Blood samples.  Ports can be placed:  · In the chest area just below the collarbone (this is the most common place.)  · In the arms.  · In the belly (abdomen) area.  · In the legs.  PARTS OF THE PORT  A port has 2 main parts:  · The reservoir. The reservoir is round, disc-shaped, and will be a small, raised area under your skin.  · The reservoir is the part where a needle is inserted (accessed) to either give medicines or to draw blood.  · The catheter. The catheter is a long, slender tube that extends from the reservoir. The catheter is placed into a large vein.  · Medicine that is inserted into the reservoir goes into the catheter and then into the vein.  INSERTION OF THE PORT  · The port is surgically placed in either an operating room or in a procedural area (interventional radiology).  · Medicine may be given to help you relax during the procedure.  · The skin where the port will be inserted is numbed (local anesthetic).  · 1 or 2 small cuts (incisions) will be made in the skin to insert the port.  · The port can be used after it has been inserted.  INCISION SITE CARE  · The incision site may have small adhesive strips on it. This helps keep the incision site closed. Sometimes, no adhesive strips are placed. Instead of adhesive strips, a special kind of surgical glue is used to keep the incision closed.  · If adhesive strips were placed on the incision sites, do not take them off. They will fall off on their own.  · The incision site may be sore for 1 to 2 days. Pain medicine can help.  · Do not get the incision site wet. Bathe or shower as directed by your caregiver.  · The incision site should heal in 5 to 7 days. A small scar may form after the  incision has healed.  ACCESSING THE PORT  Special steps must be taken to access the port:  · Before the port is accessed, a numbing cream can be placed on the skin. This helps numb the skin over the port site.  · A sterile technique is used to access the port.  · The port is accessed with a needle. Only "non-coring" port needles should be used to access the port. Once the port is accessed, a blood return should be checked. This helps ensure the port is in the vein and is not clogged (clotted).  · If your caregiver believes your port should remain accessed, a clear (transparent) bandage will be placed over the needle site. The bandage and needle will need to be changed every week or as directed by your caregiver.  · Keep the bandage covering the needle clean and dry. Do not get it wet. Follow your caregiver's instructions on how to take a shower or bath when the port is accessed.  · If your port does not need to stay accessed, no bandage is needed over the port.  FLUSHING THE PORT  Flushing the port keeps it from getting clogged. How often the port is flushed depends on:  · If a   constant infusion is running. If a constant infusion is running, the port may not need to be flushed.  · If intermittent medicines are given.  · If the port is not being used.  For intermittent medicines:  · The port will need to be flushed:  · After medicines have been given.  · After blood has been drawn.  · As part of routine maintenance.  · A port is normally flushed with:  · Normal saline.  · Heparin.  · Follow your caregiver's advice on how often, how much, and the type of flush to use on your port.  IMPORTANT PORT INFORMATION  · Tell your caregiver if you are allergic to heparin.  · After your port is placed, you will get a manufacturer's information card. The card has information about your port. Keep this card with you at all times.  · There are many types of ports available. Know what kind of port you have.  · In case of an  emergency, it may be helpful to wear a medical alert bracelet. This can help alert health care workers that you have a port.  · The port can stay in for as long as your caregiver believes it is necessary.  · When it is time for the port to come out, surgery will be done to remove it. The surgery will be similar to how the port was put in.  · If you are in the hospital or clinic:  · Your port will be taken care of and flushed by a nurse.  · If you are at home:  · A home health care nurse may give medicines and take care of the port.  · You or a family member can get special training and directions for giving medicine and taking care of the port at home.  SEEK IMMEDIATE MEDICAL CARE IF:   · Your port does not flush or you are unable to get a blood return.  · New drainage or pus is coming from the incision.  · A bad smell is coming from the incision site.  · You develop swelling or increased redness at the incision site.  · You develop increased swelling or pain at the port site.  · You develop swelling or pain in the surrounding skin near the port.  · You have an oral temperature above 102° F (38.9° C), not controlled by medicine.  MAKE SURE YOU:   · Understand these instructions.  · Will watch your condition.  · Will get help right away if you are not doing well or get worse.  Document Released: 09/04/2005 Document Revised: 11/27/2011 Document Reviewed: 11/26/2008  [employer]® Patient Information ©2014 [employer].

## 2013-06-12 ENCOUNTER — Other Ambulatory Visit: Payer: Self-pay | Admitting: Medical Oncology

## 2013-06-12 ENCOUNTER — Telehealth: Payer: Self-pay | Admitting: Internal Medicine

## 2013-06-12 ENCOUNTER — Encounter: Payer: Self-pay | Admitting: Medical Oncology

## 2013-06-12 ENCOUNTER — Telehealth: Payer: Self-pay | Admitting: Medical Oncology

## 2013-06-12 DIAGNOSIS — E559 Vitamin D deficiency, unspecified: Secondary | ICD-10-CM

## 2013-06-12 DIAGNOSIS — C8590 Non-Hodgkin lymphoma, unspecified, unspecified site: Secondary | ICD-10-CM

## 2013-06-12 NOTE — Telephone Encounter (Signed)
Pt called and is concerned about his CT appt. When he last saw Dr. Arline Asp they discussed not doing any further scans. I explained that Dr. Dalene Carrow had ordered these scans and they had not gotten cancelled. When he got his appointments after his visit with Dr. Arline Asp the scans were scheduled. I explained that we can cancel the scans, he can get his labs and see Dr. Rosie Fate and they can discuss if there is any need to get a scan. He was in agreement with this plan. I cancelled the CT and gave the pt the dates and times of his lab,flush and MD appointments. He voiced understanding.

## 2013-06-12 NOTE — Telephone Encounter (Signed)
Received message from desk nurse that pt wants to r/s lb/ct/fu 2-3 wks out. Called pt w/new appts and per pt that was not the reason for his call. When he last saw DM that talked about whether or not he would even need to do this (mri/test/scan) - test is for ct not mri. Pt states he is requesting a specific lab test to determine if the needs to do this. Pt forwarded to desk nurse who will let me know how to proceed w/appts.

## 2013-06-12 NOTE — Telephone Encounter (Signed)
Per desk nurse she had cx'd scan due to pt does not wish to have scan. Per desk nurse pt will have lb/fu as scheduled for 10/16 lb and 10/23 f/u and she has confirmed those d/t's w/pt.

## 2013-06-13 ENCOUNTER — Other Ambulatory Visit: Payer: Medicare Other | Admitting: Lab

## 2013-06-13 ENCOUNTER — Ambulatory Visit (HOSPITAL_COMMUNITY): Payer: Medicare Other

## 2013-06-19 ENCOUNTER — Ambulatory Visit: Payer: Self-pay

## 2013-06-19 ENCOUNTER — Ambulatory Visit: Payer: Medicare Other | Admitting: Hematology and Oncology

## 2013-06-23 ENCOUNTER — Other Ambulatory Visit: Payer: Self-pay | Admitting: Dermatology

## 2013-07-03 ENCOUNTER — Other Ambulatory Visit (HOSPITAL_BASED_OUTPATIENT_CLINIC_OR_DEPARTMENT_OTHER): Payer: Medicare Other

## 2013-07-03 ENCOUNTER — Other Ambulatory Visit (HOSPITAL_COMMUNITY): Payer: Medicare Other

## 2013-07-03 ENCOUNTER — Ambulatory Visit (HOSPITAL_BASED_OUTPATIENT_CLINIC_OR_DEPARTMENT_OTHER): Payer: Medicare Other

## 2013-07-03 ENCOUNTER — Encounter (INDEPENDENT_AMBULATORY_CARE_PROVIDER_SITE_OTHER): Payer: Self-pay

## 2013-07-03 VITALS — BP 159/81 | HR 82 | Temp 97.6°F

## 2013-07-03 DIAGNOSIS — C8589 Other specified types of non-Hodgkin lymphoma, extranodal and solid organ sites: Secondary | ICD-10-CM

## 2013-07-03 DIAGNOSIS — C8590 Non-Hodgkin lymphoma, unspecified, unspecified site: Secondary | ICD-10-CM

## 2013-07-03 DIAGNOSIS — E559 Vitamin D deficiency, unspecified: Secondary | ICD-10-CM

## 2013-07-03 DIAGNOSIS — Z452 Encounter for adjustment and management of vascular access device: Secondary | ICD-10-CM

## 2013-07-03 LAB — COMPREHENSIVE METABOLIC PANEL (CC13)
ALT: 19 U/L (ref 0–55)
AST: 21 U/L (ref 5–34)
Albumin: 4.1 g/dL (ref 3.5–5.0)
Calcium: 9.7 mg/dL (ref 8.4–10.4)
Chloride: 105 mEq/L (ref 98–109)
Potassium: 4.1 mEq/L (ref 3.5–5.1)
Sodium: 140 mEq/L (ref 136–145)
Total Protein: 7.3 g/dL (ref 6.4–8.3)

## 2013-07-03 LAB — CBC WITH DIFFERENTIAL/PLATELET
BASO%: 1 % (ref 0.0–2.0)
Basophils Absolute: 0.1 10*3/uL (ref 0.0–0.1)
EOS%: 3.2 % (ref 0.0–7.0)
HGB: 15.1 g/dL (ref 13.0–17.1)
MCH: 28.5 pg (ref 27.2–33.4)
MCHC: 33 g/dL (ref 32.0–36.0)
RBC: 5.3 10*6/uL (ref 4.20–5.82)
RDW: 14.6 % (ref 11.0–14.6)
lymph#: 1.5 10*3/uL (ref 0.9–3.3)

## 2013-07-03 MED ORDER — SODIUM CHLORIDE 0.9 % IJ SOLN
10.0000 mL | INTRAMUSCULAR | Status: DC | PRN
  Administered 2013-07-03: 10 mL via INTRAVENOUS
  Filled 2013-07-03: qty 10

## 2013-07-03 MED ORDER — HEPARIN SOD (PORK) LOCK FLUSH 100 UNIT/ML IV SOLN
500.0000 [IU] | Freq: Once | INTRAVENOUS | Status: AC
  Administered 2013-07-03: 500 [IU] via INTRAVENOUS
  Filled 2013-07-03: qty 5

## 2013-07-09 ENCOUNTER — Telehealth: Payer: Self-pay | Admitting: Internal Medicine

## 2013-07-09 ENCOUNTER — Other Ambulatory Visit: Payer: Self-pay | Admitting: Internal Medicine

## 2013-07-09 ENCOUNTER — Ambulatory Visit (HOSPITAL_BASED_OUTPATIENT_CLINIC_OR_DEPARTMENT_OTHER): Payer: Medicare Other | Admitting: Internal Medicine

## 2013-07-09 VITALS — BP 125/75 | HR 97 | Temp 97.4°F | Resp 20 | Ht 69.0 in | Wt 182.1 lb

## 2013-07-09 DIAGNOSIS — E559 Vitamin D deficiency, unspecified: Secondary | ICD-10-CM

## 2013-07-09 DIAGNOSIS — C8590 Non-Hodgkin lymphoma, unspecified, unspecified site: Secondary | ICD-10-CM

## 2013-07-09 DIAGNOSIS — D696 Thrombocytopenia, unspecified: Secondary | ICD-10-CM

## 2013-07-09 DIAGNOSIS — Z87898 Personal history of other specified conditions: Secondary | ICD-10-CM

## 2013-07-09 NOTE — Telephone Encounter (Signed)
gv and printed appt sched and avs for pt for Dec, Feb and April 2015

## 2013-07-10 ENCOUNTER — Ambulatory Visit: Payer: Self-pay

## 2013-07-10 NOTE — Progress Notes (Signed)
[facility] OFFICE PROGRESS NOTE  Lolita Patella, MD [address], [address] [place] [address]  DIAGNOSIS: Lymphoma - Plan: CBC with Differential, Comprehensive metabolic panel, Lactate dehydrogenase  Unspecified vitamin D deficiency  Chief Complaint  Patient presents with  . Lymphoma    CURRENT THERAPY:  INTERVAL HISTORY: [name] [name] 72 y.o. male with a history of DLBCL is here for follow-up.  He was last seen by Dr. Arline Asp on 11/21/2012.  He continues to have his port flushed every 6 weeks.  His DLBCL was diagnosed in early 2009 and is detailed below.  He denies any GI symptoms, fevers, chills, night sweats or weight loss. His appetite is good.     MEDICAL HISTORY: Past Medical History  Diagnosis Date  . Hypertension   . Femur fracture   . Hip fracture   . Other malignant lymphomas, unspecified site, extranodal and solid organ sites   . Malignant neoplasm of pancreas, part unspecified     INTERIM HISTORY: has Lymphoma and Unspecified vitamin D deficiency on his problem list.    ALLERGIES:  is allergic to iohexol and sulfa antibiotics.  MEDICATIONS: has a current medication list which includes the following prescription(s): aflibercept, vitamin c, cholecalciferol, lisinopril-hydrochlorothiazide, multivitamin, probiotic product, and UNABLE TO FIND.  SURGICAL HISTORY:  Past Surgical History  Procedure Laterality Date  . Femur surgery Bilateral   . Shoulder surgery Left   . Leg surgery Left    PROBLEM LIST:  1. Diffuse large B cell non-Hodgkin lymphoma, high-grade, presenting with pain and a large mesenteric mass which measured 8.7 x 6.4 x 7.4 cm. The initial needle biopsy was negative. A second needle biopsy carried out on 10/14/2007 gave the diagnosis. CD20 and CD7a were positive. Bone marrow on 10/31/2007 was negative. The patient received 1 cycle of Rituxan, Cytoxan, Adriamycin, vincristine, and prednisone under the direction of Dr.  Vicente Serene Odogwu. The patient went to [place] where he received whole body hyperthermia, insulin potentiation therapy, and then a few cycles of R-CHOP at 40% of the standard doses. The patient may have received additional Rituxan when he came back to the [place]. His treatments in [place] were concluded in July 2009. Rituxan treatments as a single agent apparently were concluded in early August 2009. The patient has remained disease-free. He did have CT scans of chest, abdomen, and pelvis on 06/06/2012 that were without evidence of disease.  2. Hypertension.  3. Vitamin D deficiency.  4. 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 The patient is under the care of Dr.  Octavia Heir. He had received Avastin and now is on aflibercept  (Eylea) intraocular injections.  5. Facial and sinus fractures after falling on 11/03/2012. Management is conservative.  6. Right-sided Port-A-Cath apparently placed in early 2009.  REVIEW OF SYSTEMS:   Constitutional: Denies fevers, chills or abnormal weight loss Eyes: Denies blurriness of vision Ears, nose, mouth, throat, and face: Denies mucositis or sore throat Respiratory: Denies cough, dyspnea or wheezes Cardiovascular: Denies palpitation, chest discomfort or lower extremity swelling Gastrointestinal:  Denies nausea, heartburn or change in bowel habits Skin: Denies abnormal skin rashes Lymphatics: Denies new lymphadenopathy or easy bruising Neurological:Denies numbness, tingling or new weaknesses Behavioral/Psych: Mood is stable, no new changes  All other systems were reviewed with the patient and are negative.  PHYSICAL EXAMINATION: ECOG PERFORMANCE STATUS: 0 - Asymptomatic  Blood pressure 125/75, pulse 97, temperature 97.4 F (36.3 C),  temperature source Oral, resp. rate 20, height 5\' 9"  (1.753 m), weight 182 lb 1.6 oz (82.6 kg).  GENERAL:alert, no distress and comfortable; older gentlemen who  appears his stated age. SKIN: skin color, texture, turgor are normal, no rashes or significant lesions; R port in place without associated tenderness or signs of infection.  EYES: normal, Conjunctiva are pink and non-injected, sclera clear OROPHARYNX:no exudate, no erythema and lips, buccal mucosa, and tongue normal  NECK: supple, thyroid normal size, non-tender, without nodularity LYMPH:  no palpable lymphadenopathy in the cervical, axillary or supraclavicular LUNGS: clear to auscultation and percussion with normal breathing effort HEART: regular rate & rhythm and no murmurs and no lower extremity edema ABDOMEN:abdomen soft, non-tender and normal bowel sounds Musculoskeletal:no cyanosis of digits and no clubbing  NEURO: alert & oriented x 3 with fluent speech, no focal motor/sensory deficits  Labs:  Lab Results  Component Value Date   WBC 6.2 07/03/2013   HGB 15.1 07/03/2013   HCT 45.7 07/03/2013   MCV 86.2 07/03/2013   PLT 139* 07/03/2013   NEUTROABS 4.0 07/03/2013      Chemistry      Component Value Date/Time   NA 140 07/03/2013 0942   NA 140 09/15/2011 1353   NA 140 03/06/2011 1249   K 4.1 07/03/2013 0942   K 4.4 09/15/2011 1353   K 4.3 03/06/2011 1249   CL 103 11/21/2012 0930   CL 102 09/15/2011 1353   CL 98 03/06/2011 1249   CO2 27 07/03/2013 0942   CO2 25 09/15/2011 1353   CO2 28 03/06/2011 1249   BUN 13.4 07/03/2013 0942   BUN 14 09/15/2011 1353   BUN 16 03/06/2011 1249   CREATININE 1.2 07/03/2013 0942   CREATININE 1.11 09/15/2011 1353   CREATININE 1.4* 03/06/2011 1249      Component Value Date/Time   CALCIUM 9.7 07/03/2013 0942   CALCIUM 9.6 09/15/2011 1353   CALCIUM 9.4 03/06/2011 1249   ALKPHOS 54 07/03/2013 0942   ALKPHOS 53 09/15/2011 1353   ALKPHOS 61 03/06/2011 1249   AST 21 07/03/2013 0942   AST 21 09/15/2011 1353   AST 30 03/06/2011 1249   ALT 19 07/03/2013 0942   ALT 15 09/15/2011 1353   ALT 25 03/06/2011 1249   BILITOT 1.24* 07/03/2013 0942   BILITOT  1.0 09/15/2011 1353   BILITOT 1.10 03/06/2011 1249     Basic Metabolic Panel:  Recent Labs Lab 07/03/13 0942  NA 140  K 4.1  CO2 27  GLUCOSE 108  BUN 13.4  CREATININE 1.2  CALCIUM 9.7   GFR Estimated Creatinine Clearance: 54.8 ml/min (by C-G formula based on Cr of 1.2). Liver Function Tests:  Recent Labs Lab 07/03/13 0942  AST 21  ALT 19  ALKPHOS 54  BILITOT 1.24*  PROT 7.3  ALBUMIN 4.1   CBC:  Recent Labs Lab 07/03/13 0942  WBC 6.2  NEUTROABS 4.0  HGB 15.1  HCT 45.7  MCV 86.2  PLT 139*   Results for ERIQ, HUFFORD (MRN [id number]) as of 07/10/2013 03:49  Ref. Range 09/22/2010 12:29 09/15/2011 13:53 11/21/2012 12:02 07/03/2013 09:42  Vit D, 25-Hydroxy Latest Range: 30-89 ng/mL 92 (H) 66 46 91 (H)   Studies:  No results found.   RADIOGRAPHIC STUDIES: No results found.  ASSESSMENT: [name] [name] 72 y.o. male with a history of Lymphoma - Plan: CBC with Differential, Comprehensive metabolic panel, Lactate dehydrogenase  Unspecified vitamin D deficiency   PLAN:  1. History of DLBCL --He  is over 5 years without evidence of recurrence. We dicussed eventually removing his portacath as the further out, the less likely or recurrent disease.  He was less inclined to have it removed and he wished to continue with flushes every 8 weeks.   2. Vitamin D deficiency --Vitamin D level is 91.   3. Mild Thrombocytopenia. --In review of his labs over the past several years, this appears chronic.  We will continue to observe.  He denies any symptoms of bleeding.   4. Follow-up --We will continue to see [name] in 6 months at time we will check his CBC, chemistries.    All questions were answered. The patient knows to call the clinic with any problems, questions or concerns. We can certainly see the patient much sooner if necessary.  I spent 10 minutes counseling the patient face to face. The total time spent in the appointment was 15 minutes.    Martavia Tye,  MD 07/10/2013 3:37 AM

## 2013-09-03 ENCOUNTER — Ambulatory Visit (HOSPITAL_BASED_OUTPATIENT_CLINIC_OR_DEPARTMENT_OTHER): Payer: Medicare Other

## 2013-09-03 VITALS — BP 151/84 | HR 75 | Temp 97.3°F | Resp 18

## 2013-09-03 DIAGNOSIS — C8589 Other specified types of non-Hodgkin lymphoma, extranodal and solid organ sites: Secondary | ICD-10-CM

## 2013-09-03 DIAGNOSIS — C8590 Non-Hodgkin lymphoma, unspecified, unspecified site: Secondary | ICD-10-CM

## 2013-09-03 DIAGNOSIS — Z452 Encounter for adjustment and management of vascular access device: Secondary | ICD-10-CM

## 2013-09-03 MED ORDER — SODIUM CHLORIDE 0.9 % IJ SOLN
10.0000 mL | INTRAMUSCULAR | Status: DC | PRN
  Administered 2013-09-03: 10 mL via INTRAVENOUS
  Filled 2013-09-03: qty 10

## 2013-09-03 MED ORDER — HEPARIN SOD (PORK) LOCK FLUSH 100 UNIT/ML IV SOLN
500.0000 [IU] | Freq: Once | INTRAVENOUS | Status: AC
  Administered 2013-09-03: 500 [IU] via INTRAVENOUS
  Filled 2013-09-03: qty 5

## 2013-10-29 ENCOUNTER — Ambulatory Visit (HOSPITAL_BASED_OUTPATIENT_CLINIC_OR_DEPARTMENT_OTHER): Payer: Medicare HMO

## 2013-10-29 VITALS — BP 155/84 | HR 93 | Temp 97.9°F

## 2013-10-29 DIAGNOSIS — Z95828 Presence of other vascular implants and grafts: Secondary | ICD-10-CM

## 2013-10-29 DIAGNOSIS — Z452 Encounter for adjustment and management of vascular access device: Secondary | ICD-10-CM

## 2013-10-29 DIAGNOSIS — C8589 Other specified types of non-Hodgkin lymphoma, extranodal and solid organ sites: Secondary | ICD-10-CM

## 2013-10-29 MED ORDER — HEPARIN SOD (PORK) LOCK FLUSH 100 UNIT/ML IV SOLN
500.0000 [IU] | Freq: Once | INTRAVENOUS | Status: AC
  Administered 2013-10-29: 500 [IU] via INTRAVENOUS
  Filled 2013-10-29: qty 5

## 2013-10-29 MED ORDER — SODIUM CHLORIDE 0.9 % IJ SOLN
10.0000 mL | INTRAMUSCULAR | Status: DC | PRN
  Administered 2013-10-29: 10 mL via INTRAVENOUS
  Filled 2013-10-29: qty 10

## 2013-10-29 NOTE — Patient Instructions (Signed)

## 2013-10-29 NOTE — Progress Notes (Signed)
Patient in for Port-A-Cath flush today. Port accessed and flushed without difficulty. Blood return noted with flush. Port flushed with saline and heparin as ordered. Unable to scan armband due to computer downtime. Patient tolerated flush without any complaints.

## 2013-12-24 ENCOUNTER — Ambulatory Visit (HOSPITAL_BASED_OUTPATIENT_CLINIC_OR_DEPARTMENT_OTHER): Payer: Medicare HMO | Admitting: Internal Medicine

## 2013-12-24 ENCOUNTER — Other Ambulatory Visit (HOSPITAL_BASED_OUTPATIENT_CLINIC_OR_DEPARTMENT_OTHER): Payer: Medicare HMO

## 2013-12-24 ENCOUNTER — Telehealth: Payer: Self-pay | Admitting: Internal Medicine

## 2013-12-24 ENCOUNTER — Ambulatory Visit: Payer: Medicare HMO

## 2013-12-24 VITALS — BP 166/80 | HR 81 | Temp 97.7°F | Resp 18 | Ht 69.0 in | Wt 181.7 lb

## 2013-12-24 DIAGNOSIS — Z95828 Presence of other vascular implants and grafts: Secondary | ICD-10-CM

## 2013-12-24 DIAGNOSIS — C8590 Non-Hodgkin lymphoma, unspecified, unspecified site: Secondary | ICD-10-CM

## 2013-12-24 DIAGNOSIS — Z87898 Personal history of other specified conditions: Secondary | ICD-10-CM

## 2013-12-24 DIAGNOSIS — D696 Thrombocytopenia, unspecified: Secondary | ICD-10-CM

## 2013-12-24 DIAGNOSIS — I10 Essential (primary) hypertension: Secondary | ICD-10-CM

## 2013-12-24 DIAGNOSIS — E559 Vitamin D deficiency, unspecified: Secondary | ICD-10-CM

## 2013-12-24 LAB — CBC WITH DIFFERENTIAL/PLATELET
BASO%: 0.7 % (ref 0.0–2.0)
Basophils Absolute: 0 10*3/uL (ref 0.0–0.1)
EOS ABS: 0.1 10*3/uL (ref 0.0–0.5)
EOS%: 2.4 % (ref 0.0–7.0)
HEMATOCRIT: 45.3 % (ref 38.4–49.9)
HEMOGLOBIN: 14.9 g/dL (ref 13.0–17.1)
LYMPH%: 22.3 % (ref 14.0–49.0)
MCH: 28.5 pg (ref 27.2–33.4)
MCHC: 32.8 g/dL (ref 32.0–36.0)
MCV: 86.9 fL (ref 79.3–98.0)
MONO#: 0.4 10*3/uL (ref 0.1–0.9)
MONO%: 6.3 % (ref 0.0–14.0)
NEUT#: 4.1 10*3/uL (ref 1.5–6.5)
NEUT%: 68.3 % (ref 39.0–75.0)
Platelets: 138 10*3/uL — ABNORMAL LOW (ref 140–400)
RBC: 5.21 10*6/uL (ref 4.20–5.82)
RDW: 14.7 % — ABNORMAL HIGH (ref 11.0–14.6)
WBC: 6 10*3/uL (ref 4.0–10.3)
lymph#: 1.3 10*3/uL (ref 0.9–3.3)

## 2013-12-24 LAB — COMPREHENSIVE METABOLIC PANEL (CC13)
ALT: 24 U/L (ref 0–55)
ANION GAP: 8 meq/L (ref 3–11)
AST: 25 U/L (ref 5–34)
Albumin: 4.3 g/dL (ref 3.5–5.0)
Alkaline Phosphatase: 62 U/L (ref 40–150)
BUN: 22.6 mg/dL (ref 7.0–26.0)
CALCIUM: 9.5 mg/dL (ref 8.4–10.4)
CHLORIDE: 107 meq/L (ref 98–109)
CO2: 26 meq/L (ref 22–29)
CREATININE: 1.1 mg/dL (ref 0.7–1.3)
Glucose: 119 mg/dl (ref 70–140)
Potassium: 4 mEq/L (ref 3.5–5.1)
Sodium: 141 mEq/L (ref 136–145)
Total Bilirubin: 0.93 mg/dL (ref 0.20–1.20)
Total Protein: 7.2 g/dL (ref 6.4–8.3)

## 2013-12-24 LAB — LACTATE DEHYDROGENASE (CC13): LDH: 203 U/L (ref 125–245)

## 2013-12-24 MED ORDER — SODIUM CHLORIDE 0.9 % IJ SOLN
10.0000 mL | INTRAMUSCULAR | Status: DC | PRN
  Administered 2013-12-24: 10 mL via INTRAVENOUS
  Filled 2013-12-24: qty 10

## 2013-12-24 MED ORDER — HEPARIN SOD (PORK) LOCK FLUSH 100 UNIT/ML IV SOLN
500.0000 [IU] | Freq: Once | INTRAVENOUS | Status: AC
  Administered 2013-12-24: 500 [IU] via INTRAVENOUS
  Filled 2013-12-24: qty 5

## 2013-12-24 NOTE — Telephone Encounter (Signed)
gv pt appt schedule for june thru nov

## 2013-12-24 NOTE — Patient Instructions (Signed)

## 2013-12-26 NOTE — Progress Notes (Signed)
[place], [place] [address] [place]  [address]  DIAGNOSIS: Lymphoma - Plan: CBC with Differential, Comprehensive metabolic panel (Cmet) - [facility], Lactate dehydrogenase (LDH) - [facility]  Unspecified vitamin D deficiency - Plan: CBC with Differential, Comprehensive metabolic panel (Cmet) - [facility], Lactate dehydrogenase (LDH) - [facility]  Chief Complaint  Patient presents with  . Follow-up    CURRENT THERAPY:Observation.  INTERVAL HISTORY: JOVEN MOM 73 y.o. male with a history of DLBCL is here for follow-up.  He was last seen by me on 07/09/2014.  He continues to have his port flushed every 6-8 weeks.  His DLBCL was diagnosed in early 2009 and is detailed below.  He denies any GI symptoms, fevers, chills, night sweats or weight loss. His appetite is good.   He is reluctant to have his port-a-cath removed given doing its insertion, he had a pneumothorax.   MEDICAL HISTORY: Past Medical History  Diagnosis Date  . Hypertension   . Femur fracture   . Hip fracture   . Other malignant lymphomas, unspecified site, extranodal and solid organ sites   . Malignant neoplasm of pancreas, part unspecified     INTERIM HISTORY: has Lymphoma and Unspecified vitamin D deficiency on his problem list.    ALLERGIES:  is allergic to iohexol and sulfa antibiotics.  MEDICATIONS: has a current medication list which includes the following prescription(s): aflibercept, vitamin c, cholecalciferol, lisinopril-hydrochlorothiazide, multivitamin, probiotic product, and UNABLE TO FIND.  SURGICAL HISTORY:  Past Surgical History  Procedure Laterality Date  . Femur surgery Bilateral   . Shoulder surgery Left   . Leg surgery Left    PROBLEM LIST:  1. Diffuse large B cell non-Hodgkin lymphoma, high-grade, presenting with pain and a large mesenteric mass which measured 8.7 x 6.4 x 7.4 cm. The initial needle biopsy was negative. A second needle  biopsy carried out on 10/14/2007 gave the diagnosis. CD20 and CD7a were positive. Bone marrow on 10/31/2007 was negative. The patient received 1 cycle of Rituxan, Cytoxan, Adriamycin, vincristine, and prednisone under the direction of Dr. Jerold Coombe Odogwu. The patient went to [place] where he received whole body hyperthermia, insulin potentiation therapy, and then a few cycles of R-CHOP at 40% of the standard doses. The patient may have received additional Rituxan when he came back to the [place]. His treatments in [place] were concluded in July 2009. Rituxan treatments as a single agent apparently were concluded in early August 2009. The patient has remained disease-free. He did have CT scans of chest, abdomen, and pelvis on 06/06/2012 that were without evidence of disease.  2. Hypertension.  3. Vitamin D deficiency.  4. 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 The patient is under the care of Dr.  Lorrene Reid. He had received Avastin and now is on aflibercept  (Eylea) intraocular injections.  5. Facial and sinus fractures after falling on 11/03/2012. Management is conservative.  6. Right-sided Port-A-Cath apparently placed in early 2009.  REVIEW OF SYSTEMS:   Constitutional: Denies fevers, chills or abnormal weight loss Eyes: Denies blurriness of vision Ears, nose, mouth, throat, and face: Denies mucositis or sore throat Respiratory: Denies cough, dyspnea or wheezes Cardiovascular: Denies palpitation, chest discomfort or lower extremity swelling Gastrointestinal:  Denies nausea, heartburn or change in bowel habits Skin: Denies abnormal skin rashes Lymphatics: Denies new lymphadenopathy or easy bruising Neurological:Denies numbness, tingling or new weaknesses Behavioral/Psych: Mood  is stable, no new changes  All other systems were reviewed with the patient and are negative.  PHYSICAL EXAMINATION: ECOG PERFORMANCE STATUS: 0 -  Asymptomatic  Blood pressure 166/80, pulse 81, temperature 97.7 F (36.5 C), temperature source Oral, resp. rate 18, height 5\' 9"  (1.753 m), weight 181 lb 11.2 oz (82.419 kg).  GENERAL:alert, no distress and comfortable; older gentlemen who appears his stated age. SKIN: skin color, texture, turgor are normal, no rashes or significant lesions; R port in place without associated tenderness or signs of infection.  EYES: normal, Conjunctiva are pink and non-injected, sclera clear OROPHARYNX:no exudate, no erythema and lips, buccal mucosa, and tongue normal  NECK: supple, thyroid normal size, non-tender, without nodularity LYMPH:  no palpable lymphadenopathy in the cervical, axillary or supraclavicular LUNGS: clear to auscultation and percussion with normal breathing effort HEART: regular rate & rhythm and no murmurs and no lower extremity edema ABDOMEN:abdomen soft, non-tender and normal bowel sounds Musculoskeletal:no cyanosis of digits and no clubbing  NEURO: alert & oriented x 3 with fluent speech, no focal motor/sensory deficits  Labs:  Lab Results  Component Value Date   WBC 6.0 12/24/2013   HGB 14.9 12/24/2013   HCT 45.3 12/24/2013   MCV 86.9 12/24/2013   PLT 138* 12/24/2013   NEUTROABS 4.1 12/24/2013      Chemistry      Component Value Date/Time   NA 141 12/24/2013 0923   NA 140 09/15/2011 1353   NA 140 03/06/2011 1249   K 4.0 12/24/2013 0923   K 4.4 09/15/2011 1353   K 4.3 03/06/2011 1249   CL 103 11/21/2012 0930   CL 102 09/15/2011 1353   CL 98 03/06/2011 1249   CO2 26 12/24/2013 0923   CO2 25 09/15/2011 1353   CO2 28 03/06/2011 1249   BUN 22.6 12/24/2013 0923   BUN 14 09/15/2011 1353   BUN 16 03/06/2011 1249   CREATININE 1.1 12/24/2013 0923   CREATININE 1.11 09/15/2011 1353   CREATININE 1.4* 03/06/2011 1249      Component Value Date/Time   CALCIUM 9.5 12/24/2013 0923   CALCIUM 9.6 09/15/2011 1353   CALCIUM 9.4 03/06/2011 1249   ALKPHOS 62 12/24/2013 0923   ALKPHOS 53 09/15/2011 1353    ALKPHOS 61 03/06/2011 1249   AST 25 12/24/2013 0923   AST 21 09/15/2011 1353   AST 30 03/06/2011 1249   ALT 24 12/24/2013 0923   ALT 15 09/15/2011 1353   ALT 25 03/06/2011 1249   BILITOT 0.93 12/24/2013 0923   BILITOT 1.0 09/15/2011 1353   BILITOT 1.10 03/06/2011 1249     Basic Metabolic Panel:  Recent Labs Lab 12/24/13 0923  NA 141  K 4.0  CO2 26  GLUCOSE 119  BUN 22.6  CREATININE 1.1  CALCIUM 9.5   GFR Estimated Creatinine Clearance: 59.8 ml/min (by C-G formula based on Cr of 1.1). Liver Function Tests:  Recent Labs Lab 12/24/13 0923  AST 25  ALT 24  ALKPHOS 62  BILITOT 0.93  PROT 7.2  ALBUMIN 4.3   CBC:  Recent Labs Lab 12/24/13 0924  WBC 6.0  NEUTROABS 4.1  HGB 14.9  HCT 45.3  MCV 86.9  PLT 138*   Results for EVON, LOPEZPEREZ (MRN [id number]) as of 07/10/2013 03:49  Ref. Range 09/22/2010 12:29 09/15/2011 13:53 11/21/2012 12:02 07/03/2013 09:42  Vit D, 25-Hydroxy Latest Range: 30-89 ng/mL 92 (H) 66 46 91 (H)   Studies:  No results found.   RADIOGRAPHIC STUDIES: No results found.  ASSESSMENT: [name] [name] 73 y.o. male with a history of Lymphoma - Plan: CBC with Differential, Comprehensive metabolic panel (Cmet) - [facility], Lactate dehydrogenase (LDH) - [facility]  Unspecified vitamin D deficiency - Plan: CBC with Differential, Comprehensive metabolic panel (Cmet) - [facility], Lactate dehydrogenase (LDH) - [facility]   PLAN:  1. History of DLBCL --He is over 5 years without evidence of recurrence. We dicussed eventually removing his portacath as the further out, the less likely or recurrent disease.  He was less inclined to not have it removed and he wished to continue with flushes every 8 weeks.   2. Vitamin D deficiency --Vitamin D level is 91 last visit  3. Mild Thrombocytopenia. --In review of his labs over the past several years, this appears chronic.  We will continue to observe.  He denies any symptoms of bleeding.   4. Follow-up --We will continue to see Mr.  Moses in 6 months at time we will check his CBC, chemistries.    All questions were answered. The patient knows to call the clinic with any problems, questions or concerns. We can certainly see the patient much sooner if necessary.  I spent 10 minutes counseling the patient face to face. The total time spent in the appointment was 15 minutes.    Concha Norway, MD 12/26/2013 4:08 AM

## 2014-02-18 ENCOUNTER — Ambulatory Visit (HOSPITAL_BASED_OUTPATIENT_CLINIC_OR_DEPARTMENT_OTHER): Payer: Medicare HMO

## 2014-02-18 VITALS — BP 144/93 | HR 90

## 2014-02-18 DIAGNOSIS — Z452 Encounter for adjustment and management of vascular access device: Secondary | ICD-10-CM

## 2014-02-18 DIAGNOSIS — Z95828 Presence of other vascular implants and grafts: Secondary | ICD-10-CM

## 2014-02-18 DIAGNOSIS — Z87898 Personal history of other specified conditions: Secondary | ICD-10-CM

## 2014-02-18 MED ORDER — HEPARIN SOD (PORK) LOCK FLUSH 100 UNIT/ML IV SOLN
500.0000 [IU] | Freq: Once | INTRAVENOUS | Status: AC
  Administered 2014-02-18: 500 [IU] via INTRAVENOUS
  Filled 2014-02-18: qty 5

## 2014-02-18 MED ORDER — SODIUM CHLORIDE 0.9 % IJ SOLN
10.0000 mL | INTRAMUSCULAR | Status: DC | PRN
  Administered 2014-02-18: 10 mL via INTRAVENOUS
  Filled 2014-02-18: qty 10

## 2014-04-15 ENCOUNTER — Ambulatory Visit (HOSPITAL_BASED_OUTPATIENT_CLINIC_OR_DEPARTMENT_OTHER): Payer: Medicare HMO

## 2014-04-15 DIAGNOSIS — Z95828 Presence of other vascular implants and grafts: Secondary | ICD-10-CM

## 2014-04-15 DIAGNOSIS — Z87898 Personal history of other specified conditions: Secondary | ICD-10-CM

## 2014-04-15 DIAGNOSIS — Z452 Encounter for adjustment and management of vascular access device: Secondary | ICD-10-CM

## 2014-04-15 MED ORDER — SODIUM CHLORIDE 0.9 % IJ SOLN
10.0000 mL | INTRAMUSCULAR | Status: DC | PRN
  Administered 2014-04-15: 10 mL via INTRAVENOUS
  Filled 2014-04-15: qty 10

## 2014-04-15 MED ORDER — HEPARIN SOD (PORK) LOCK FLUSH 100 UNIT/ML IV SOLN
500.0000 [IU] | Freq: Once | INTRAVENOUS | Status: AC
  Administered 2014-04-15: 500 [IU] via INTRAVENOUS
  Filled 2014-04-15: qty 5

## 2014-04-15 NOTE — Patient Instructions (Signed)

## 2014-06-10 ENCOUNTER — Other Ambulatory Visit: Payer: Self-pay

## 2014-06-10 ENCOUNTER — Ambulatory Visit: Payer: Self-pay

## 2014-06-10 ENCOUNTER — Telehealth: Payer: Self-pay | Admitting: Hematology

## 2014-06-10 NOTE — Telephone Encounter (Signed)
Pt called to r/s due to no transportation, pt confirmed r/s MD for Oct 6 mth f/u and labs/flush already scheduled...Marland KitchenMarland KitchenKJ

## 2014-06-15 ENCOUNTER — Other Ambulatory Visit: Payer: Self-pay

## 2014-06-15 ENCOUNTER — Other Ambulatory Visit (HOSPITAL_BASED_OUTPATIENT_CLINIC_OR_DEPARTMENT_OTHER): Payer: Medicare HMO

## 2014-06-15 ENCOUNTER — Ambulatory Visit (HOSPITAL_BASED_OUTPATIENT_CLINIC_OR_DEPARTMENT_OTHER): Payer: Medicare HMO

## 2014-06-15 VITALS — BP 134/79 | HR 74 | Temp 97.6°F

## 2014-06-15 DIAGNOSIS — C8590 Non-Hodgkin lymphoma, unspecified, unspecified site: Secondary | ICD-10-CM

## 2014-06-15 DIAGNOSIS — Z87898 Personal history of other specified conditions: Secondary | ICD-10-CM

## 2014-06-15 DIAGNOSIS — Z95828 Presence of other vascular implants and grafts: Secondary | ICD-10-CM

## 2014-06-15 DIAGNOSIS — E559 Vitamin D deficiency, unspecified: Secondary | ICD-10-CM

## 2014-06-15 DIAGNOSIS — Z452 Encounter for adjustment and management of vascular access device: Secondary | ICD-10-CM

## 2014-06-15 LAB — CBC WITH DIFFERENTIAL/PLATELET
BASO%: 0.5 % (ref 0.0–2.0)
Basophils Absolute: 0 10*3/uL (ref 0.0–0.1)
EOS ABS: 0.3 10*3/uL (ref 0.0–0.5)
EOS%: 4.6 % (ref 0.0–7.0)
HEMATOCRIT: 43.5 % (ref 38.4–49.9)
HGB: 14.4 g/dL (ref 13.0–17.1)
LYMPH%: 23.9 % (ref 14.0–49.0)
MCH: 29 pg (ref 27.2–33.4)
MCHC: 33.1 g/dL (ref 32.0–36.0)
MCV: 87.7 fL (ref 79.3–98.0)
MONO#: 0.4 10*3/uL (ref 0.1–0.9)
MONO%: 6.2 % (ref 0.0–14.0)
NEUT%: 64.8 % (ref 39.0–75.0)
NEUTROS ABS: 4.1 10*3/uL (ref 1.5–6.5)
PLATELETS: 148 10*3/uL (ref 140–400)
RBC: 4.96 10*6/uL (ref 4.20–5.82)
RDW: 14.4 % (ref 11.0–14.6)
WBC: 6.3 10*3/uL (ref 4.0–10.3)
lymph#: 1.5 10*3/uL (ref 0.9–3.3)

## 2014-06-15 LAB — COMPREHENSIVE METABOLIC PANEL (CC13)
ALT: 19 U/L (ref 0–55)
ANION GAP: 7 meq/L (ref 3–11)
AST: 21 U/L (ref 5–34)
Albumin: 4 g/dL (ref 3.5–5.0)
Alkaline Phosphatase: 51 U/L (ref 40–150)
BILIRUBIN TOTAL: 1.14 mg/dL (ref 0.20–1.20)
BUN: 15.6 mg/dL (ref 7.0–26.0)
CALCIUM: 9.7 mg/dL (ref 8.4–10.4)
CO2: 27 meq/L (ref 22–29)
Chloride: 105 mEq/L (ref 98–109)
Creatinine: 1.2 mg/dL (ref 0.7–1.3)
Glucose: 111 mg/dl (ref 70–140)
Potassium: 4 mEq/L (ref 3.5–5.1)
SODIUM: 139 meq/L (ref 136–145)
TOTAL PROTEIN: 7 g/dL (ref 6.4–8.3)

## 2014-06-15 LAB — VITAMIN D 25 HYDROXY (VIT D DEFICIENCY, FRACTURES): Vit D, 25-Hydroxy: 114 ng/mL — ABNORMAL HIGH (ref 30–89)

## 2014-06-15 LAB — LACTATE DEHYDROGENASE (CC13): LDH: 208 U/L (ref 125–245)

## 2014-06-15 MED ORDER — SODIUM CHLORIDE 0.9 % IJ SOLN
10.0000 mL | INTRAMUSCULAR | Status: DC | PRN
  Administered 2014-06-15: 10 mL via INTRAVENOUS
  Filled 2014-06-15: qty 10

## 2014-06-15 MED ORDER — HEPARIN SOD (PORK) LOCK FLUSH 100 UNIT/ML IV SOLN
500.0000 [IU] | Freq: Once | INTRAVENOUS | Status: AC
  Administered 2014-06-15: 500 [IU] via INTRAVENOUS
  Filled 2014-06-15: qty 5

## 2014-06-15 NOTE — Patient Instructions (Signed)

## 2014-06-29 ENCOUNTER — Ambulatory Visit (HOSPITAL_BASED_OUTPATIENT_CLINIC_OR_DEPARTMENT_OTHER): Payer: Medicare HMO | Admitting: Hematology

## 2014-06-29 ENCOUNTER — Encounter: Payer: Self-pay | Admitting: Hematology

## 2014-06-29 VITALS — BP 155/85 | HR 74 | Temp 97.6°F | Resp 19 | Ht 69.0 in | Wt 185.2 lb

## 2014-06-29 DIAGNOSIS — C8330 Diffuse large B-cell lymphoma, unspecified site: Secondary | ICD-10-CM

## 2014-06-29 DIAGNOSIS — D696 Thrombocytopenia, unspecified: Secondary | ICD-10-CM

## 2014-06-29 NOTE — Progress Notes (Signed)
Refusing flu shot this season

## 2014-06-29 NOTE — Progress Notes (Signed)
[facility] PROGRESS NOTE DATE OF VISIT: 06/29/2014  [name], [place] [address]  DIAGNOSIS: DLBCL (diffuse large B cell lymphoma) - Plan: CBC with Differential, Comprehensive metabolic panel (Cmet) - [facility], Lactate dehydrogenase (LDH) - [facility]  Chief Complaint  Patient presents with  . Follow-up    CURRENT THERAPY:Observation.  INTERVAL HISTORY:  [name] [name] 73 y.o. male with a history of DLBCL is here for follow-up.  He was last seen by Dr Juliann Mule on 12/24/2013.   He continues to have his port flushed every 6-8 weeks.  His DLBCL was diagnosed in early 2009 and is detailed below.  He denies any GI symptoms, fevers, chills, night sweats or weight loss. His appetite is good.   He is reluctant to have his port-a-cath removed given doing its insertion, he had a pneumothorax. He had some nonspecific RLQ abdominal pain but my physical exam was normal. He occasionally gets the Vitamin C infusions to boost his immune system. No other B symptoms or constitutional symptoms noted.  MEDICAL HISTORY: Past Medical History  Diagnosis Date  . Hypertension   . Femur fracture   . Hip fracture   . Other malignant lymphomas, unspecified site, extranodal and solid organ sites   . Malignant neoplasm of pancreas, part unspecified     INTERIM HISTORY: has Lymphoma and Unspecified vitamin D deficiency on his problem list.    ALLERGIES:  is allergic to iohexol and sulfa antibiotics.  MEDICATIONS: has a current medication list which includes the following prescription(s): aflibercept, vitamin c, cholecalciferol, lisinopril-hydrochlorothiazide, multivitamin, probiotic product, and UNABLE TO FIND.  SURGICAL HISTORY:  Past Surgical History  Procedure Laterality Date  . Femur surgery Bilateral   . Shoulder surgery Left   . Leg surgery Left    PROBLEM LIST:  1. Diffuse large B cell non-Hodgkin lymphoma, high-grade, presenting  with pain and a large mesenteric mass which measured 8.7 x 6.4 x 7.4 cm. The initial needle biopsy was negative. A second needle biopsy carried out on 10/14/2007 gave the diagnosis. CD20 and CD7a were positive. Bone marrow on 10/31/2007 was negative. The patient received 1 cycle of Rituxan, Cytoxan, Adriamycin, vincristine, and prednisone under the direction of Dr. Jerold Coombe Odogwu. The patient went to [place] where he received whole body hyperthermia, insulin potentiation therapy, and then a few cycles of R-CHOP at 40% of the standard doses. The patient may have received additional Rituxan when he came back to the [place]. His treatments in [place] were concluded in July 2009. Rituxan treatments as a single agent apparently were concluded in early August 2009. The patient has remained disease-free. He did have CT scans of chest, abdomen, and pelvis on 06/06/2012 that were without evidence of disease.  2. Hypertension.  3. Vitamin D deficiency.  4. 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 The patient is under the care of Dr.  Lorrene Reid. He had received Avastin and now is on aflibercept (Eylea) intraocular injections.  5. Facial and sinus fractures after falling on 11/03/2012. Management is conservative.  6. Right-sided Port-A-Cath apparently placed in early 2009.  REVIEW OF SYSTEMS:   Constitutional: Denies fevers, chills or abnormal weight loss Eyes: Denies blurriness of vision Ears, nose, mouth, throat, and face: Denies mucositis or sore throat Respiratory: Denies cough, dyspnea or wheezes Cardiovascular: Denies palpitation, chest discomfort or lower extremity swelling Gastrointestinal:  Denies nausea, heartburn or change in  bowel habits Skin: Denies abnormal skin rashes Lymphatics: Denies new lymphadenopathy or easy bruising Neurological:Denies numbness, tingling or new weaknesses Behavioral/Psych: Mood is stable, no new changes  All  other systems were reviewed with the patient and are negative.  PHYSICAL EXAMINATION: ECOG PERFORMANCE STATUS: 0  Blood pressure 155/85, pulse 74, temperature 97.6 F (36.4 C), temperature source Oral, resp. rate 19, height 5\' 9"  (1.753 m), weight 185 lb 3.2 oz (84.006 kg).  GENERAL:alert, no distress and comfortable; older gentlemen who appears his stated age. SKIN: skin color, texture, turgor are normal, no rashes or significant lesions; R port in place without associated tenderness or signs of infection.  EYES: normal, Conjunctiva are pink and non-injected, sclera clear OROPHARYNX:no exudate, no erythema and lips, buccal mucosa, and tongue normal  NECK: supple, thyroid normal size, non-tender, without nodularity LYMPH:  no palpable lymphadenopathy in the cervical, axillary or supraclavicular LUNGS: clear to auscultation and percussion with normal breathing effort HEART: regular rate & rhythm and no murmurs and no lower extremity edema ABDOMEN:abdomen soft, non-tender and normal bowel sounds Musculoskeletal:no cyanosis of digits and no clubbing  NEURO: alert & oriented x 3 with fluent speech, no focal motor/sensory deficits  Labs:            RADIOGRAPHIC STUDIES: No results found.  ASSESSMENT: [name] [name] 73 y.o. male with a history of DLBCL (diffuse large B cell lymphoma) - Plan: CBC with Differential, Comprehensive metabolic panel (Cmet) - [facility], Lactate dehydrogenase (LDH) - [facility]   PLAN:  1. History of DLBCL --He is over 5 years without evidence of recurrence. We dicussed eventually removing his portacath as the further out, the less likely or recurrent disease.  He was less inclined to not have it removed and he wished to continue with flushes every 8 weeks.   2. Vitamin D deficiency --Vitamin D level is 114 and he takes some organic form of Vitamin D formulation.  3. Mild Thrombocytopenia. --In review of his labs over the past several years, this appears chronic  but today he is normal.  We will continue to observe.  He denies any symptoms of bleeding.   4. Follow-up --We will continue to see [name] in 6 months at time we will check his CBC, chemistries.    All questions were answered. The patient knows to call the clinic with any problems, questions or concerns. We can certainly see the patient much sooner if necessary.  I spent 15 minutes counseling the patient face to face. The total time spent in the appointment was 25 minutes.    Bernadene Bell, MD Medical Hematologist/Oncologist [place] Pager: [phone] Office No: [phone]

## 2014-06-30 ENCOUNTER — Telehealth: Payer: Self-pay | Admitting: Hematology

## 2014-06-30 NOTE — Telephone Encounter (Signed)
LM inform of added apts 11/15-05/16 as well as 47mo apt with [facility]. Mailed apt sch Chief Strategy Officer

## 2014-08-04 ENCOUNTER — Telehealth: Payer: Self-pay | Admitting: Hematology

## 2014-08-04 NOTE — Telephone Encounter (Signed)
Pt called to r/s appt due to having Surg. Confirmed new d/t for appt.

## 2014-08-10 ENCOUNTER — Ambulatory Visit (HOSPITAL_BASED_OUTPATIENT_CLINIC_OR_DEPARTMENT_OTHER): Payer: Medicare HMO

## 2014-08-10 VITALS — BP 139/73 | HR 78 | Temp 97.4°F

## 2014-08-10 DIAGNOSIS — Z95828 Presence of other vascular implants and grafts: Secondary | ICD-10-CM

## 2014-08-10 DIAGNOSIS — C8330 Diffuse large B-cell lymphoma, unspecified site: Secondary | ICD-10-CM

## 2014-08-10 DIAGNOSIS — Z452 Encounter for adjustment and management of vascular access device: Secondary | ICD-10-CM

## 2014-08-10 MED ORDER — HEPARIN SOD (PORK) LOCK FLUSH 100 UNIT/ML IV SOLN
500.0000 [IU] | Freq: Once | INTRAVENOUS | Status: AC
  Administered 2014-08-10: 500 [IU] via INTRAVENOUS
  Filled 2014-08-10: qty 5

## 2014-08-10 MED ORDER — SODIUM CHLORIDE 0.9 % IJ SOLN
10.0000 mL | INTRAMUSCULAR | Status: DC | PRN
Start: 2014-08-10 — End: 2014-08-10
  Administered 2014-08-10: 10 mL via INTRAVENOUS
  Filled 2014-08-10: qty 10

## 2014-08-10 NOTE — Patient Instructions (Signed)

## 2014-09-16 ENCOUNTER — Ambulatory Visit (HOSPITAL_BASED_OUTPATIENT_CLINIC_OR_DEPARTMENT_OTHER): Payer: Medicare HMO

## 2014-09-16 VITALS — BP 153/85 | HR 86 | Temp 98.0°F | Resp 16

## 2014-09-16 DIAGNOSIS — Z452 Encounter for adjustment and management of vascular access device: Secondary | ICD-10-CM

## 2014-09-16 DIAGNOSIS — C8330 Diffuse large B-cell lymphoma, unspecified site: Secondary | ICD-10-CM

## 2014-09-16 DIAGNOSIS — Z95828 Presence of other vascular implants and grafts: Secondary | ICD-10-CM

## 2014-09-16 MED ORDER — HEPARIN SOD (PORK) LOCK FLUSH 100 UNIT/ML IV SOLN
500.0000 [IU] | Freq: Once | INTRAVENOUS | Status: AC
  Administered 2014-09-16: 500 [IU] via INTRAVENOUS
  Filled 2014-09-16: qty 5

## 2014-09-16 MED ORDER — SODIUM CHLORIDE 0.9 % IJ SOLN
10.0000 mL | INTRAMUSCULAR | Status: DC | PRN
  Administered 2014-09-16: 10 mL via INTRAVENOUS
  Filled 2014-09-16: qty 10

## 2014-09-16 NOTE — Patient Instructions (Signed)

## 2014-10-28 ENCOUNTER — Ambulatory Visit (HOSPITAL_BASED_OUTPATIENT_CLINIC_OR_DEPARTMENT_OTHER): Payer: Medicare HMO

## 2014-10-28 VITALS — BP 162/92 | HR 78 | Temp 97.4°F

## 2014-10-28 DIAGNOSIS — Z452 Encounter for adjustment and management of vascular access device: Secondary | ICD-10-CM

## 2014-10-28 DIAGNOSIS — Z95828 Presence of other vascular implants and grafts: Secondary | ICD-10-CM

## 2014-10-28 DIAGNOSIS — C8330 Diffuse large B-cell lymphoma, unspecified site: Secondary | ICD-10-CM

## 2014-10-28 MED ORDER — HEPARIN SOD (PORK) LOCK FLUSH 100 UNIT/ML IV SOLN
500.0000 [IU] | Freq: Once | INTRAVENOUS | Status: AC
  Administered 2014-10-28: 500 [IU] via INTRAVENOUS
  Filled 2014-10-28: qty 5

## 2014-10-28 MED ORDER — SODIUM CHLORIDE 0.9 % IJ SOLN
10.0000 mL | INTRAMUSCULAR | Status: DC | PRN
  Administered 2014-10-28: 10 mL via INTRAVENOUS
  Filled 2014-10-28: qty 10

## 2014-10-28 NOTE — Patient Instructions (Signed)

## 2014-11-06 ENCOUNTER — Telehealth: Payer: Self-pay | Admitting: Internal Medicine

## 2014-11-06 NOTE — Telephone Encounter (Signed)
, °

## 2014-12-08 ENCOUNTER — Telehealth: Payer: Self-pay | Admitting: Internal Medicine

## 2014-12-08 NOTE — Telephone Encounter (Signed)
Returned patients call to verify appts and add a flush

## 2014-12-09 ENCOUNTER — Ambulatory Visit (HOSPITAL_BASED_OUTPATIENT_CLINIC_OR_DEPARTMENT_OTHER): Payer: Medicare HMO

## 2014-12-09 DIAGNOSIS — Z452 Encounter for adjustment and management of vascular access device: Secondary | ICD-10-CM | POA: Diagnosis not present

## 2014-12-09 DIAGNOSIS — C8330 Diffuse large B-cell lymphoma, unspecified site: Secondary | ICD-10-CM

## 2014-12-09 DIAGNOSIS — Z95828 Presence of other vascular implants and grafts: Secondary | ICD-10-CM

## 2014-12-09 MED ORDER — SODIUM CHLORIDE 0.9 % IJ SOLN
10.0000 mL | INTRAMUSCULAR | Status: DC | PRN
  Administered 2014-12-09: 10 mL via INTRAVENOUS
  Filled 2014-12-09: qty 10

## 2014-12-09 MED ORDER — HEPARIN SOD (PORK) LOCK FLUSH 100 UNIT/ML IV SOLN
500.0000 [IU] | Freq: Once | INTRAVENOUS | Status: AC
  Administered 2014-12-09: 500 [IU] via INTRAVENOUS
  Filled 2014-12-09: qty 5

## 2014-12-09 NOTE — Patient Instructions (Signed)

## 2014-12-28 ENCOUNTER — Other Ambulatory Visit: Payer: Self-pay | Admitting: Medical Oncology

## 2014-12-28 DIAGNOSIS — C8590 Non-Hodgkin lymphoma, unspecified, unspecified site: Secondary | ICD-10-CM

## 2014-12-29 ENCOUNTER — Other Ambulatory Visit: Payer: Medicare HMO

## 2014-12-29 ENCOUNTER — Encounter: Payer: Self-pay | Admitting: Internal Medicine

## 2014-12-29 ENCOUNTER — Ambulatory Visit (HOSPITAL_BASED_OUTPATIENT_CLINIC_OR_DEPARTMENT_OTHER): Payer: Medicare HMO | Admitting: Internal Medicine

## 2014-12-29 ENCOUNTER — Ambulatory Visit: Payer: Medicare HMO

## 2014-12-29 ENCOUNTER — Other Ambulatory Visit (HOSPITAL_BASED_OUTPATIENT_CLINIC_OR_DEPARTMENT_OTHER): Payer: Medicare HMO

## 2014-12-29 ENCOUNTER — Telehealth: Payer: Self-pay | Admitting: Internal Medicine

## 2014-12-29 ENCOUNTER — Other Ambulatory Visit: Payer: Self-pay | Admitting: Medical Oncology

## 2014-12-29 VITALS — BP 157/84 | HR 83 | Temp 97.5°F | Wt 183.2 lb

## 2014-12-29 DIAGNOSIS — Z8572 Personal history of non-Hodgkin lymphomas: Secondary | ICD-10-CM | POA: Diagnosis not present

## 2014-12-29 DIAGNOSIS — Z95828 Presence of other vascular implants and grafts: Secondary | ICD-10-CM

## 2014-12-29 DIAGNOSIS — R109 Unspecified abdominal pain: Secondary | ICD-10-CM

## 2014-12-29 DIAGNOSIS — C8590 Non-Hodgkin lymphoma, unspecified, unspecified site: Secondary | ICD-10-CM

## 2014-12-29 DIAGNOSIS — Z452 Encounter for adjustment and management of vascular access device: Secondary | ICD-10-CM | POA: Diagnosis not present

## 2014-12-29 LAB — COMPREHENSIVE METABOLIC PANEL (CC13)
ALT: 25 U/L (ref 0–55)
AST: 25 U/L (ref 5–34)
Albumin: 4.4 g/dL (ref 3.5–5.0)
Alkaline Phosphatase: 65 U/L (ref 40–150)
Anion Gap: 9 meq/L (ref 3–11)
BUN: 15.6 mg/dL (ref 7.0–26.0)
CO2: 29 meq/L (ref 22–29)
Calcium: 9.6 mg/dL (ref 8.4–10.4)
Chloride: 103 meq/L (ref 98–109)
Creatinine: 1.2 mg/dL (ref 0.7–1.3)
EGFR: 62 ml/min/1.73 m2 — ABNORMAL LOW
Glucose: 112 mg/dL (ref 70–140)
Potassium: 4.1 meq/L (ref 3.5–5.1)
Sodium: 141 meq/L (ref 136–145)
Total Bilirubin: 1.08 mg/dL (ref 0.20–1.20)
Total Protein: 7.4 g/dL (ref 6.4–8.3)

## 2014-12-29 LAB — CBC WITH DIFFERENTIAL/PLATELET
BASO%: 0.5 % (ref 0.0–2.0)
Basophils Absolute: 0 10e3/uL (ref 0.0–0.1)
EOS%: 2.1 % (ref 0.0–7.0)
Eosinophils Absolute: 0.1 10e3/uL (ref 0.0–0.5)
HCT: 48.5 % (ref 38.4–49.9)
HGB: 15.9 g/dL (ref 13.0–17.1)
LYMPH%: 25.6 % (ref 14.0–49.0)
MCH: 28.9 pg (ref 27.2–33.4)
MCHC: 32.8 g/dL (ref 32.0–36.0)
MCV: 88.2 fL (ref 79.3–98.0)
MONO#: 0.4 10e3/uL (ref 0.1–0.9)
MONO%: 6.7 % (ref 0.0–14.0)
NEUT#: 3.8 10e3/uL (ref 1.5–6.5)
NEUT%: 65.1 % (ref 39.0–75.0)
Platelets: 159 10e3/uL (ref 140–400)
RBC: 5.5 10e6/uL (ref 4.20–5.82)
RDW: 14.2 % (ref 11.0–14.6)
WBC: 5.8 10e3/uL (ref 4.0–10.3)
lymph#: 1.5 10e3/uL (ref 0.9–3.3)

## 2014-12-29 LAB — LACTATE DEHYDROGENASE (CC13): LDH: 207 U/L (ref 125–245)

## 2014-12-29 MED ORDER — HEPARIN SOD (PORK) LOCK FLUSH 100 UNIT/ML IV SOLN
500.0000 [IU] | Freq: Once | INTRAVENOUS | Status: AC
  Administered 2014-12-29: 500 [IU] via INTRAVENOUS
  Filled 2014-12-29: qty 5

## 2014-12-29 MED ORDER — SODIUM CHLORIDE 0.9 % IJ SOLN
10.0000 mL | INTRAMUSCULAR | Status: AC | PRN
  Administered 2014-12-29: 10 mL via INTRAVENOUS
  Filled 2014-12-29: qty 10

## 2014-12-29 MED ORDER — HEPARIN SOD (PORK) LOCK FLUSH 100 UNIT/ML IV SOLN
500.0000 [IU] | Freq: Once | INTRAVENOUS | Status: AC
Start: 2014-12-29 — End: 2014-12-29
  Administered 2014-12-29: 500 [IU] via INTRAVENOUS
  Filled 2014-12-29: qty 5

## 2014-12-29 MED ORDER — ALTEPLASE 2 MG IJ SOLR
2.0000 mg | Freq: Once | INTRAMUSCULAR | Status: AC | PRN
  Administered 2014-12-29: 2 mg
  Filled 2014-12-29: qty 2

## 2014-12-29 MED ORDER — SODIUM CHLORIDE 0.9 % IJ SOLN
10.0000 mL | INTRAMUSCULAR | Status: DC | PRN
  Administered 2014-12-29: 10 mL via INTRAVENOUS
  Filled 2014-12-29: qty 10

## 2014-12-29 NOTE — Telephone Encounter (Signed)
Mailed calendar. Patient did not return back to scheduling.

## 2014-12-29 NOTE — Progress Notes (Signed)
[facility] [phone]   Fax:([phone]) [place], [place] [address]  DIAGNOSIS: Diffuse large B-cell non-Hodgkin lymphoma presented as mesenteric soft tissue mass close to the head of the pancreas diagnosed in January 2009  PRIOR THERAPY: Status post low-dose systemic chemotherapy with CHOP/Rituxan 4 cycles as well as intraperitoneal hyperthermia treatment in [place] completed in July 2009 with almost complete response.  CURRENT THERAPY: Observation.   INTERVAL HISTORY: [name] [name] 74 y.o. male returns to the clinic today for follow-up visit and to establish care with me. He is a former patient of Dr. Jamse Arn, Dr. Juliann Mule and Dr. Lona Kettle is here today to establish care with me after these physicians left the practice. The patient is feeling fine today with no specific complaints except for intermittent abdominal pain and he is concern about lymphoma recurrence. His last CT scan of the chest, abdomen and pelvis was performed in September 2013. He denied having any significant weight loss or night sweats. The patient denied having any chest pain, shortness breath, cough or hemoptysis. He has no nausea or vomiting. He has repeat CBC, comprehensive metabolic panel and LDH performed earlier today and he is here for evaluation and discussion of his lab results. The patient is a never smoker and drinks alcohol occasionally. He used to work in [employer]. He is single and has 2 sons. His father was diagnosed with prostate cancer.   MEDICAL HISTORY: Past Medical History  Diagnosis Date  . Hypertension   . Femur fracture   . Hip fracture   . Other malignant lymphomas, unspecified site, extranodal and solid organ sites   . Malignant neoplasm of pancreas, part unspecified     ALLERGIES:  is allergic to iohexol and sulfa antibiotics.  MEDICATIONS:  Current Outpatient  Prescriptions  Medication Sig Dispense Refill  . Aflibercept (EYLEA) 2 MG/0.05ML SOLN Inject into the eye as directed. Take every 6 wks in eye doctor office.    . Ascorbic Acid (VITAMIN C) 1000 MG tablet Take 2,000 mg by mouth daily.      . Calcium-Magnesium (CALMAG THINS PO) Take by mouth.    . cholecalciferol (VITAMIN D) 1000 UNITS tablet Take 15,000 Units by mouth daily.     Marland Kitchen lisinopril-hydrochlorothiazide (PRINZIDE,ZESTORETIC) 20-12.5 MG per tablet Take 2 tablets by mouth daily.     . Multiple Vitamin (MULTIVITAMIN) tablet Take 6 tablets by mouth daily.      . Probiotic Product (PROBIOTIC FORMULA PO) Take 1 tablet by mouth daily.      Marland Kitchen UNABLE TO FIND Take 1 capsule by mouth daily. Med Name:TUMERIC     No current facility-administered medications for this visit.   Facility-Administered Medications Ordered in Other Visits  Medication Dose Route Frequency Provider Last Rate Last Dose  . alteplase (CATHFLO ACTIVASE) injection 2 mg  2 mg Intracatheter Once PRN Curt Bears, MD      . heparin lock flush 100 unit/mL  500 Units Intravenous Once Curt Bears, MD      . heparin lock flush 100 unit/mL  500 Units Intravenous Once Curt Bears, MD      . sodium chloride 0.9 % injection 10 mL  10 mL Intravenous PRN Curt Bears, MD      . sodium chloride 0.9 % injection 10 mL  10 mL Intravenous PRN Curt Bears, MD        SURGICAL HISTORY:  Past Surgical  History  Procedure Laterality Date  . Femur surgery Bilateral   . Shoulder surgery Left   . Leg surgery Left     REVIEW OF SYSTEMS:  Constitutional: negative Eyes: negative Ears, nose, mouth, throat, and face: negative Respiratory: negative Cardiovascular: negative Gastrointestinal: positive for abdominal pain Genitourinary:negative Integument/breast: negative Hematologic/lymphatic: negative Musculoskeletal:negative Neurological: negative Behavioral/Psych: negative Endocrine: negative Allergic/Immunologic: negative     PHYSICAL EXAMINATION: General appearance: alert, cooperative and no distress Head: Normocephalic, without obvious abnormality, atraumatic Neck: no adenopathy, no JVD, supple, symmetrical, trachea midline and thyroid not enlarged, symmetric, no tenderness/mass/nodules Lymph nodes: Cervical, supraclavicular, and axillary nodes normal. Resp: clear to auscultation bilaterally Back: symmetric, no curvature. ROM normal. No CVA tenderness. Cardio: regular rate and rhythm, S1, S2 normal, no murmur, click, rub or gallop GI: Mildly tenderness to palpation in the epigastric area Extremities: extremities normal, atraumatic, no cyanosis or edema Neurologic: Alert and oriented X 3, normal strength and tone. Normal symmetric reflexes. Normal coordination and gait  ECOG PERFORMANCE STATUS: 1 - Symptomatic but completely ambulatory  There were no vitals taken for this visit.  LABORATORY DATA: Lab Results  Component Value Date   WBC 6.3 06/15/2014   HGB 14.4 06/15/2014   HCT 43.5 06/15/2014   MCV 87.7 06/15/2014   PLT 148 06/15/2014      Chemistry      Component Value Date/Time   NA 139 06/15/2014 0844   NA 140 09/15/2011 1353   NA 140 03/06/2011 1249   K 4.0 06/15/2014 0844   K 4.4 09/15/2011 1353   K 4.3 03/06/2011 1249   CL 103 11/21/2012 0930   CL 102 09/15/2011 1353   CL 98 03/06/2011 1249   CO2 27 06/15/2014 0844   CO2 25 09/15/2011 1353   CO2 28 03/06/2011 1249   BUN 15.6 06/15/2014 0844   BUN 14 09/15/2011 1353   BUN 16 03/06/2011 1249   CREATININE 1.2 06/15/2014 0844   CREATININE 1.11 09/15/2011 1353   CREATININE 1.4* 03/06/2011 1249      Component Value Date/Time   CALCIUM 9.7 06/15/2014 0844   CALCIUM 9.6 09/15/2011 1353   CALCIUM 9.4 03/06/2011 1249   ALKPHOS 51 06/15/2014 0844   ALKPHOS 53 09/15/2011 1353   ALKPHOS 61 03/06/2011 1249   AST 21 06/15/2014 0844   AST 21 09/15/2011 1353   AST 30 03/06/2011 1249   ALT 19 06/15/2014 0844   ALT 15 09/15/2011 1353    ALT 25 03/06/2011 1249   BILITOT 1.14 06/15/2014 0844   BILITOT 1.0 09/15/2011 1353   BILITOT 1.10 03/06/2011 1249       RADIOGRAPHIC STUDIES: No results found.  ASSESSMENT AND PLAN: This is a very pleasant 74 years old white male with history of large B-cell non-Hodgkin lymphoma presenting with a large mass close to the head of the pancreas status post low-dose systemic chemotherapy with CHOP/Rituxan for 4 cycles in addition to hyperthermic therapy in [place] with almost complete response. The patient has been observation for the last few years with no significant evidence for disease recurrence. He is complaining today of intermittent abdominal pain. I recommended for the patient to have repeat CT scan of the abdomen and pelvis to rule out any disease recurrence and to evaluate his abdominal pain. If no significant abnormality on the CT scan of the abdomen and pelvis, I would see the patient back for follow-up visit in one year with repeat CBC, comprehensive metabolic panel and LDH. The patient was advised to call immediately  if he has any concerning symptoms in the interval. He will continue Port-A-Cath flush every 8 weeks.  The patient voices understanding of current disease status and treatment options and is in agreement with the current care plan.  All questions were answered. The patient knows to call the clinic with any problems, questions or concerns. We can certainly see the patient much sooner if necessary.  I spent 15 minutes counseling the patient face to face. The total time spent in the appointment was 25 minutes.  Disclaimer: This note was dictated with voice recognition software. Similar sounding words can inadvertently be transcribed and may not be corrected upon review.

## 2014-12-29 NOTE — Telephone Encounter (Signed)
Spoke with patient he was having labs drawn and will pick up schedule for June thru April 2017.

## 2014-12-29 NOTE — Patient Instructions (Signed)

## 2014-12-29 NOTE — Progress Notes (Signed)
Pt in for labs and PAC flush.  PAC accessed without problems. Flushes without difficulty, no blood return noted. Abelina Bachelor, RN and Dr Julien Nordmann notified.  Pt states that he would like for his port to have the TPA placed in and see if blood return can be obtained.

## 2015-01-06 ENCOUNTER — Encounter (HOSPITAL_COMMUNITY): Payer: Self-pay

## 2015-01-06 ENCOUNTER — Ambulatory Visit (HOSPITAL_COMMUNITY)
Admission: RE | Admit: 2015-01-06 | Discharge: 2015-01-06 | Disposition: A | Discharge status: Home / Self Care | Payer: Medicare HMO | Source: Ambulatory Visit | Attending: Internal Medicine | Admitting: Internal Medicine

## 2015-01-06 DIAGNOSIS — Z9221 Personal history of antineoplastic chemotherapy: Secondary | ICD-10-CM | POA: Insufficient documentation

## 2015-01-06 DIAGNOSIS — C8590 Non-Hodgkin lymphoma, unspecified, unspecified site: Secondary | ICD-10-CM

## 2015-01-06 DIAGNOSIS — R109 Unspecified abdominal pain: Secondary | ICD-10-CM | POA: Insufficient documentation

## 2015-01-06 MED ORDER — PREDNISONE 50 MG PO TABS
50.0000 mg | ORAL_TABLET | Freq: Every day | ORAL | Status: DC
  Filled 2015-01-06: qty 1

## 2015-01-06 MED ORDER — DIPHENHYDRAMINE HCL 25 MG PO CAPS: 50.0000 mg | ORAL_CAPSULE | Freq: Once | ORAL | Status: DC

## 2015-01-06 MED ORDER — IOHEXOL 300 MG/ML  SOLN
100.0000 mL | Freq: Once | INTRAMUSCULAR | Status: AC | PRN
  Administered 2015-01-06: 100 mL via INTRAVENOUS

## 2015-02-23 ENCOUNTER — Ambulatory Visit (HOSPITAL_BASED_OUTPATIENT_CLINIC_OR_DEPARTMENT_OTHER): Payer: Medicare HMO

## 2015-02-23 VITALS — BP 149/83 | HR 76 | Temp 98.2°F | Resp 18

## 2015-02-23 DIAGNOSIS — Z95828 Presence of other vascular implants and grafts: Secondary | ICD-10-CM

## 2015-02-23 DIAGNOSIS — Z452 Encounter for adjustment and management of vascular access device: Secondary | ICD-10-CM | POA: Diagnosis not present

## 2015-02-23 DIAGNOSIS — C8330 Diffuse large B-cell lymphoma, unspecified site: Secondary | ICD-10-CM

## 2015-02-23 MED ORDER — HEPARIN SOD (PORK) LOCK FLUSH 100 UNIT/ML IV SOLN
500.0000 [IU] | Freq: Once | INTRAVENOUS | Status: AC
  Administered 2015-02-23: 500 [IU] via INTRAVENOUS
  Filled 2015-02-23: qty 5

## 2015-02-23 MED ORDER — SODIUM CHLORIDE 0.9 % IJ SOLN
10.0000 mL | INTRAMUSCULAR | Status: DC | PRN
  Administered 2015-02-23: 10 mL via INTRAVENOUS
  Filled 2015-02-23: qty 10

## 2015-02-23 NOTE — Patient Instructions (Signed)

## 2015-04-07 ENCOUNTER — Emergency Department (HOSPITAL_COMMUNITY): Payer: Medicare HMO

## 2015-04-07 ENCOUNTER — Emergency Department (HOSPITAL_COMMUNITY)
Admission: EM | Admit: 2015-04-07 | Discharge: 2015-04-07 | Disposition: A | Discharge status: Home / Self Care | Payer: Medicare HMO | Attending: Emergency Medicine | Admitting: Emergency Medicine

## 2015-04-07 ENCOUNTER — Encounter (HOSPITAL_COMMUNITY): Payer: Self-pay | Admitting: *Deleted

## 2015-04-07 DIAGNOSIS — Y9289 Other specified places as the place of occurrence of the external cause: Secondary | ICD-10-CM | POA: Insufficient documentation

## 2015-04-07 DIAGNOSIS — Z8507 Personal history of malignant neoplasm of pancreas: Secondary | ICD-10-CM | POA: Insufficient documentation

## 2015-04-07 DIAGNOSIS — S4991XA Unspecified injury of right shoulder and upper arm, initial encounter: Secondary | ICD-10-CM | POA: Diagnosis present

## 2015-04-07 DIAGNOSIS — X58XXXA Exposure to other specified factors, initial encounter: Secondary | ICD-10-CM | POA: Insufficient documentation

## 2015-04-07 DIAGNOSIS — S56911A Strain of unspecified muscles, fascia and tendons at forearm level, right arm, initial encounter: Secondary | ICD-10-CM | POA: Insufficient documentation

## 2015-04-07 DIAGNOSIS — Z8572 Personal history of non-Hodgkin lymphomas: Secondary | ICD-10-CM | POA: Insufficient documentation

## 2015-04-07 DIAGNOSIS — I10 Essential (primary) hypertension: Secondary | ICD-10-CM | POA: Insufficient documentation

## 2015-04-07 DIAGNOSIS — Z9889 Other specified postprocedural states: Secondary | ICD-10-CM | POA: Diagnosis not present

## 2015-04-07 DIAGNOSIS — Y9389 Activity, other specified: Secondary | ICD-10-CM | POA: Diagnosis not present

## 2015-04-07 DIAGNOSIS — Z8781 Personal history of (healed) traumatic fracture: Secondary | ICD-10-CM | POA: Insufficient documentation

## 2015-04-07 DIAGNOSIS — Y998 Other external cause status: Secondary | ICD-10-CM | POA: Diagnosis not present

## 2015-04-07 DIAGNOSIS — Z79899 Other long term (current) drug therapy: Secondary | ICD-10-CM | POA: Insufficient documentation

## 2015-04-07 DIAGNOSIS — T148XXA Other injury of unspecified body region, initial encounter: Secondary | ICD-10-CM

## 2015-04-07 NOTE — ED Provider Notes (Signed)
CSN: [id number]     Arrival date & time 04/07/15  1117 History   First MD Initiated Contact with Patient 04/07/15 1136     Chief Complaint  Patient presents with  . Arm Injury     (Consider location/radiation/quality/duration/timing/severity/associated sxs/prior Treatment) HPI   [name] [name] is a 74 y.o. malePresents for evaluation of right arm injury. He was helping to move a washing machine when he had sudden pain with a popping sensation in his right-medial volar forearm.He denies other injuries. He is not having neck or back pain at this time. There were no preceding problems with the right arm or old injuries of the right arm. There are no other known modifying factors.   Past Medical History  Diagnosis Date  . Hypertension   . Femur fracture   . Hip fracture   . Other malignant lymphomas, unspecified site, extranodal and solid organ sites   . Malignant neoplasm of pancreas, part unspecified    Past Surgical History  Procedure Laterality Date  . Femur surgery Bilateral   . Shoulder surgery Left   . Leg surgery Left    No family history on file. History  Substance Use Topics  . Smoking status: Never Smoker   . Smokeless tobacco: Not on file  . Alcohol Use: No    Review of Systems  All other systems reviewed and are negative.     Allergies  Iohexol and Sulfa antibiotics  Home Medications   Prior to Admission medications   Medication Sig Start Date End Date Taking? Authorizing Provider  Aflibercept (EYLEA) 2 MG/0.05ML SOLN Inject into the eye as directed. Take every 6 wks in eye doctor office.    Historical Provider, MD  Ascorbic Acid (VITAMIN C) 1000 MG tablet Take 2,000 mg by mouth daily.      Historical Provider, MD  Calcium-Magnesium (CALMAG THINS PO) Take by mouth.    Historical Provider, MD  cholecalciferol (VITAMIN D) 1000 UNITS tablet Take 15,000 Units by mouth daily.     Historical Provider, MD  lisinopril-hydrochlorothiazide (PRINZIDE,ZESTORETIC)  20-12.5 MG per tablet Take 2 tablets by mouth daily.     Historical Provider, MD  Multiple Vitamin (MULTIVITAMIN) tablet Take 6 tablets by mouth daily.      Historical Provider, MD  Probiotic Product (PROBIOTIC FORMULA PO) Take 1 tablet by mouth daily.      Historical Provider, MD  UNABLE TO FIND Take 1 capsule by mouth daily. Med Name:TUMERIC    Historical Provider, MD   BP 156/84 mmHg  Pulse 87  Temp(Src) 97.8 F (36.6 C) (Oral)  Resp 18  SpO2 97% Physical Exam  Constitutional: He is oriented to person, place, and time. He appears well-developed and well-nourished.  HENT:  Head: Normocephalic and atraumatic.  Right Ear: External ear normal.  Left Ear: External ear normal.  Eyes: Conjunctivae and EOM are normal. Pupils are equal, round, and reactive to light.  Neck: Normal range of motion and phonation normal. Neck supple.  Cardiovascular: Normal rate.   Pulmonary/Chest: Effort normal. He exhibits no bony tenderness.  Abdominal: Soft.  Musculoskeletal: Normal range of motion.  Right arm somewhat tender at the proximal medial aspect of the ulna. No palpable deformity at this area. He is able to flex, extend and supinate/pronate the right elbow, with mild limitation secondary to pain. No deformity of the bicep muscle, with active flexion of the right elbow. Neurovascular intact distally in the right hand.  Neurological: He is alert and oriented to person, place,  and time. No cranial nerve deficit or sensory deficit. He exhibits normal muscle tone. Coordination normal.  Skin: Skin is warm, dry and intact.  Psychiatric: He has a normal mood and affect. His behavior is normal. Judgment and thought content normal.  Nursing note and vitals reviewed.   ED Course  Procedures (including critical care time)  Medications - No data to display  Patient Vitals for the past 24 hrs:  BP Temp Temp src Pulse Resp SpO2  04/07/15 1129 156/84 mmHg 97.8 F (36.6 C) Oral 87 18 97 %   Sling applied  per nursing.  12:45 PM Reevaluation with update and discussion. After initial assessment and treatment, an updated evaluation reveals no additional complaints. Clinical exam is unchanged. Findings discussed with patient, all questions answered.. [place] Review Labs Reviewed - No data to display  Imaging Review No results found.   EKG Interpretation None      MDM   Final diagnoses:  Arm injury, right, initial encounter  Muscle strain     Evaluation consistent with muscle strain. Doubt significant tear, fracture or ligamentous injury.  Nursing Notes Reviewed/ Care Coordinated Applicable Imaging Reviewed Interpretation of Laboratory Data incorporated into ED treatment  The patient appears reasonably screened and/or stabilized for discharge and I doubt any other medical condition or other [facility] requiring further screening, evaluation, or treatment in the ED at this time prior to discharge.  Plan: Home Medications- OTC analgesia; Home Treatments- Heat, sling prn; return here if the recommended treatment, does not improve the symptoms; Recommended follow up- PCP prn     Daleen Bo, MD 04/07/15 1246

## 2015-04-07 NOTE — Discharge Instructions (Signed)
Use the sling as needed for comfort. Use ice on the sore area 3 or 4 times a day for 20 minutes. Take ibuprofen or acetaminophen, for pain.    Muscle Strain A muscle strain is an injury that occurs when a muscle is stretched beyond its normal length. Usually a small number of muscle fibers are torn when this happens. Muscle strain is rated in degrees. First-degree strains have the least amount of muscle fiber tearing and pain. Second-degree and third-degree strains have increasingly more tearing and pain.  Usually, recovery from muscle strain takes 1-2 weeks. Complete healing takes 5-6 weeks.  CAUSES  Muscle strain happens when a sudden, violent force placed on a muscle stretches it too far. This may occur with lifting, sports, or a fall.  RISK FACTORS Muscle strain is especially common in athletes.  SIGNS AND SYMPTOMS At the site of the muscle strain, there may be:  Pain.  Bruising.  Swelling.  Difficulty using the muscle due to pain or lack of normal function. DIAGNOSIS  Your health care provider will perform a physical exam and ask about your medical history. TREATMENT  Often, the best treatment for a muscle strain is resting, icing, and applying cold compresses to the injured area.  HOME CARE INSTRUCTIONS   Use the PRICE method of treatment to promote muscle healing during the first 2-3 days after your injury. The PRICE method involves:  Protecting the muscle from being injured again.  Restricting your activity and resting the injured body part.  Icing your injury. To do this, put ice in a plastic bag. Place a towel between your skin and the bag. Then, apply the ice and leave it on from 15-20 minutes each hour. After the third day, switch to moist heat packs.  Apply compression to the injured area with a splint or elastic bandage. Be careful not to wrap it too tightly. This may interfere with blood circulation or increase swelling.  Elevate the injured body part above the  level of your heart as often as you can.  Only take over-the-counter or prescription medicines for pain, discomfort, or fever as directed by your health care provider.  Warming up prior to exercise helps to prevent future muscle strains. SEEK MEDICAL CARE IF:   You have increasing pain or swelling in the injured area.  You have numbness, tingling, or a significant loss of strength in the injured area. MAKE SURE YOU:   Understand these instructions.  Will watch your condition.  Will get help right away if you are not doing well or get worse. Document Released: 09/04/2005 Document Revised: 06/25/2013 Document Reviewed: 04/03/2013 [facility] Patient Information 2015 [place], [place]. This information is not intended to replace advice given to you by your health care provider. Make sure you discuss any questions you have with your health care provider.

## 2015-04-07 NOTE — ED Notes (Addendum)
Pt states he was lifting a washing machine and heard a pop in his right forearm. Pt now has 8/10 pain in his anterior medial forearm when he moves his elbow.

## 2015-04-07 NOTE — Progress Notes (Signed)
2Orthopedic Tech Progress Note Patient Details:  [name] [name] 1941-03-05 [id number]  Ortho Devices Type of Ortho Device: Sling immobilizer Ortho Device/Splint Interventions: Application   Cammer, Theodoro Parma 04/07/2015, 1:46 PM2

## 2015-04-20 ENCOUNTER — Ambulatory Visit (HOSPITAL_BASED_OUTPATIENT_CLINIC_OR_DEPARTMENT_OTHER): Payer: Medicare HMO

## 2015-04-20 VITALS — BP 141/66 | HR 58 | Temp 97.6°F | Resp 18

## 2015-04-20 DIAGNOSIS — C8330 Diffuse large B-cell lymphoma, unspecified site: Secondary | ICD-10-CM

## 2015-04-20 DIAGNOSIS — Z452 Encounter for adjustment and management of vascular access device: Secondary | ICD-10-CM | POA: Diagnosis not present

## 2015-04-20 DIAGNOSIS — Z95828 Presence of other vascular implants and grafts: Secondary | ICD-10-CM

## 2015-04-20 MED ORDER — HEPARIN SOD (PORK) LOCK FLUSH 100 UNIT/ML IV SOLN
500.0000 [IU] | Freq: Once | INTRAVENOUS | Status: AC
  Administered 2015-04-20: 500 [IU] via INTRAVENOUS
  Filled 2015-04-20: qty 5

## 2015-04-20 MED ORDER — SODIUM CHLORIDE 0.9 % IJ SOLN
10.0000 mL | INTRAMUSCULAR | Status: DC | PRN
  Administered 2015-04-20: 10 mL via INTRAVENOUS
  Filled 2015-04-20: qty 10

## 2015-04-20 NOTE — Patient Instructions (Signed)

## 2015-06-15 ENCOUNTER — Ambulatory Visit (HOSPITAL_BASED_OUTPATIENT_CLINIC_OR_DEPARTMENT_OTHER): Payer: Medicare HMO

## 2015-06-15 DIAGNOSIS — C8330 Diffuse large B-cell lymphoma, unspecified site: Secondary | ICD-10-CM

## 2015-06-15 DIAGNOSIS — Z95828 Presence of other vascular implants and grafts: Secondary | ICD-10-CM

## 2015-06-15 DIAGNOSIS — Z452 Encounter for adjustment and management of vascular access device: Secondary | ICD-10-CM

## 2015-06-15 MED ORDER — HEPARIN SOD (PORK) LOCK FLUSH 100 UNIT/ML IV SOLN
500.0000 [IU] | Freq: Once | INTRAVENOUS | Status: AC
  Administered 2015-06-15: 500 [IU] via INTRAVENOUS
  Filled 2015-06-15: qty 5

## 2015-06-15 MED ORDER — SODIUM CHLORIDE 0.9 % IJ SOLN
10.0000 mL | INTRAMUSCULAR | Status: DC | PRN
  Administered 2015-06-15: 10 mL via INTRAVENOUS
  Filled 2015-06-15: qty 10

## 2015-06-15 NOTE — Patient Instructions (Signed)

## 2015-06-22 DIAGNOSIS — H34813 Central retinal vein occlusion, bilateral, with macular edema: Secondary | ICD-10-CM | POA: Diagnosis not present

## 2015-06-22 DIAGNOSIS — H35372 Puckering of macula, left eye: Secondary | ICD-10-CM | POA: Diagnosis not present

## 2015-07-23 DIAGNOSIS — H34811 Central retinal vein occlusion, right eye, with macular edema: Secondary | ICD-10-CM | POA: Diagnosis not present

## 2015-08-10 ENCOUNTER — Ambulatory Visit (HOSPITAL_BASED_OUTPATIENT_CLINIC_OR_DEPARTMENT_OTHER): Payer: Medicare HMO

## 2015-08-10 VITALS — BP 126/70 | HR 89 | Temp 97.8°F | Resp 18

## 2015-08-10 DIAGNOSIS — C8330 Diffuse large B-cell lymphoma, unspecified site: Secondary | ICD-10-CM | POA: Diagnosis not present

## 2015-08-10 DIAGNOSIS — Z95828 Presence of other vascular implants and grafts: Secondary | ICD-10-CM

## 2015-08-10 DIAGNOSIS — Z452 Encounter for adjustment and management of vascular access device: Secondary | ICD-10-CM | POA: Diagnosis not present

## 2015-08-10 MED ORDER — HEPARIN SOD (PORK) LOCK FLUSH 100 UNIT/ML IV SOLN
500.0000 [IU] | Freq: Once | INTRAVENOUS | Status: AC
  Administered 2015-08-10: 500 [IU] via INTRAVENOUS
  Filled 2015-08-10: qty 5

## 2015-08-10 MED ORDER — SODIUM CHLORIDE 0.9 % IJ SOLN
10.0000 mL | INTRAMUSCULAR | Status: DC | PRN
  Administered 2015-08-10: 10 mL via INTRAVENOUS
  Filled 2015-08-10: qty 10

## 2015-08-10 NOTE — Patient Instructions (Signed)

## 2015-08-24 DIAGNOSIS — H35372 Puckering of macula, left eye: Secondary | ICD-10-CM | POA: Diagnosis not present

## 2015-08-24 DIAGNOSIS — H34813 Central retinal vein occlusion, bilateral, with macular edema: Secondary | ICD-10-CM | POA: Diagnosis not present

## 2015-09-29 DIAGNOSIS — H34813 Central retinal vein occlusion, bilateral, with macular edema: Secondary | ICD-10-CM | POA: Diagnosis not present

## 2015-10-05 ENCOUNTER — Ambulatory Visit (HOSPITAL_BASED_OUTPATIENT_CLINIC_OR_DEPARTMENT_OTHER): Payer: Medicare HMO

## 2015-10-05 DIAGNOSIS — Z452 Encounter for adjustment and management of vascular access device: Secondary | ICD-10-CM

## 2015-10-05 DIAGNOSIS — C8330 Diffuse large B-cell lymphoma, unspecified site: Secondary | ICD-10-CM | POA: Diagnosis not present

## 2015-10-05 DIAGNOSIS — Z95828 Presence of other vascular implants and grafts: Secondary | ICD-10-CM

## 2015-10-05 MED ORDER — HEPARIN SOD (PORK) LOCK FLUSH 100 UNIT/ML IV SOLN
500.0000 [IU] | Freq: Once | INTRAVENOUS | Status: AC
Start: 2015-10-05 — End: 2015-10-05
  Administered 2015-10-05: 500 [IU] via INTRAVENOUS
  Filled 2015-10-05: qty 5

## 2015-10-05 MED ORDER — SODIUM CHLORIDE 0.9 % IJ SOLN
10.0000 mL | INTRAMUSCULAR | Status: DC | PRN
  Administered 2015-10-05: 10 mL via INTRAVENOUS
  Filled 2015-10-05: qty 10

## 2015-10-05 NOTE — Patient Instructions (Signed)

## 2015-11-30 ENCOUNTER — Ambulatory Visit (HOSPITAL_BASED_OUTPATIENT_CLINIC_OR_DEPARTMENT_OTHER): Payer: Medicare HMO

## 2015-11-30 VITALS — BP 127/71 | Temp 97.5°F | Resp 17

## 2015-11-30 DIAGNOSIS — C8330 Diffuse large B-cell lymphoma, unspecified site: Secondary | ICD-10-CM | POA: Diagnosis not present

## 2015-11-30 DIAGNOSIS — Z452 Encounter for adjustment and management of vascular access device: Secondary | ICD-10-CM | POA: Diagnosis not present

## 2015-11-30 DIAGNOSIS — Z95828 Presence of other vascular implants and grafts: Secondary | ICD-10-CM

## 2015-11-30 MED ORDER — SODIUM CHLORIDE 0.9% FLUSH
10.0000 mL | INTRAVENOUS | Status: DC | PRN
  Administered 2015-11-30: 10 mL via INTRAVENOUS
  Filled 2015-11-30: qty 10

## 2015-11-30 MED ORDER — HEPARIN SOD (PORK) LOCK FLUSH 100 UNIT/ML IV SOLN
500.0000 [IU] | Freq: Once | INTRAVENOUS | Status: AC
  Administered 2015-11-30: 500 [IU] via INTRAVENOUS
  Filled 2015-11-30: qty 5

## 2015-11-30 NOTE — Patient Instructions (Signed)

## 2015-12-22 ENCOUNTER — Ambulatory Visit: Payer: Medicare HMO | Admitting: Internal Medicine

## 2015-12-22 ENCOUNTER — Other Ambulatory Visit: Payer: Medicare HMO

## 2015-12-28 ENCOUNTER — Other Ambulatory Visit: Payer: Self-pay | Admitting: Medical Oncology

## 2015-12-28 ENCOUNTER — Other Ambulatory Visit: Payer: Medicare HMO

## 2015-12-28 ENCOUNTER — Telehealth: Payer: Self-pay | Admitting: Internal Medicine

## 2015-12-28 ENCOUNTER — Ambulatory Visit (HOSPITAL_BASED_OUTPATIENT_CLINIC_OR_DEPARTMENT_OTHER): Payer: Medicare HMO

## 2015-12-28 ENCOUNTER — Ambulatory Visit: Payer: Medicare HMO | Admitting: Internal Medicine

## 2015-12-28 ENCOUNTER — Encounter: Payer: Self-pay | Admitting: Internal Medicine

## 2015-12-28 ENCOUNTER — Ambulatory Visit (HOSPITAL_BASED_OUTPATIENT_CLINIC_OR_DEPARTMENT_OTHER): Payer: Medicare HMO | Admitting: Internal Medicine

## 2015-12-28 VITALS — BP 144/81 | HR 85 | Temp 98.2°F | Resp 18 | Ht 69.0 in | Wt 182.6 lb

## 2015-12-28 DIAGNOSIS — Z8572 Personal history of non-Hodgkin lymphomas: Secondary | ICD-10-CM

## 2015-12-28 DIAGNOSIS — Z95828 Presence of other vascular implants and grafts: Secondary | ICD-10-CM

## 2015-12-28 DIAGNOSIS — C8330 Diffuse large B-cell lymphoma, unspecified site: Secondary | ICD-10-CM | POA: Diagnosis not present

## 2015-12-28 DIAGNOSIS — C8590 Non-Hodgkin lymphoma, unspecified, unspecified site: Secondary | ICD-10-CM

## 2015-12-28 DIAGNOSIS — C8251 Diffuse follicle center lymphoma, lymph nodes of head, face, and neck: Secondary | ICD-10-CM | POA: Insufficient documentation

## 2015-12-28 LAB — CBC WITH DIFFERENTIAL/PLATELET
BASO%: 0.3 % (ref 0.0–2.0)
Basophils Absolute: 0 10*3/uL (ref 0.0–0.1)
EOS%: 2.2 % (ref 0.0–7.0)
Eosinophils Absolute: 0.1 10*3/uL (ref 0.0–0.5)
HCT: 42.6 % (ref 38.4–49.9)
HEMOGLOBIN: 14.1 g/dL (ref 13.0–17.1)
LYMPH%: 19 % (ref 14.0–49.0)
MCH: 29.2 pg (ref 27.2–33.4)
MCHC: 33.1 g/dL (ref 32.0–36.0)
MCV: 88.2 fL (ref 79.3–98.0)
MONO#: 0.4 10*3/uL (ref 0.1–0.9)
MONO%: 6 % (ref 0.0–14.0)
NEUT%: 72.5 % (ref 39.0–75.0)
NEUTROS ABS: 4.2 10*3/uL (ref 1.5–6.5)
PLATELETS: 167 10*3/uL (ref 140–400)
RBC: 4.83 10*6/uL (ref 4.20–5.82)
RDW: 14.7 % — AB (ref 11.0–14.6)
WBC: 5.8 10*3/uL (ref 4.0–10.3)
lymph#: 1.1 10*3/uL (ref 0.9–3.3)

## 2015-12-28 LAB — COMPREHENSIVE METABOLIC PANEL
ALT: 19 U/L (ref 0–55)
ANION GAP: 8 meq/L (ref 3–11)
AST: 22 U/L (ref 5–34)
Albumin: 4.1 g/dL (ref 3.5–5.0)
Alkaline Phosphatase: 57 U/L (ref 40–150)
BUN: 18.3 mg/dL (ref 7.0–26.0)
CALCIUM: 10 mg/dL (ref 8.4–10.4)
CO2: 26 mEq/L (ref 22–29)
Chloride: 108 mEq/L (ref 98–109)
Creatinine: 1.2 mg/dL (ref 0.7–1.3)
EGFR: 61 mL/min/{1.73_m2} — AB (ref >90)
Glucose: 117 mg/dl (ref 70–140)
POTASSIUM: 4.1 meq/L (ref 3.5–5.1)
Sodium: 142 mEq/L (ref 136–145)
Total Bilirubin: 0.87 mg/dL (ref 0.20–1.20)
Total Protein: 7.3 g/dL (ref 6.4–8.3)

## 2015-12-28 LAB — LACTATE DEHYDROGENASE: LDH: 189 U/L (ref 125–245)

## 2015-12-28 MED ORDER — HEPARIN SOD (PORK) LOCK FLUSH 100 UNIT/ML IV SOLN
500.0000 [IU] | Freq: Once | INTRAVENOUS | Status: AC
  Administered 2015-12-28: 500 [IU] via INTRAVENOUS
  Filled 2015-12-28: qty 5

## 2015-12-28 MED ORDER — SODIUM CHLORIDE 0.9% FLUSH
10.0000 mL | INTRAVENOUS | Status: DC | PRN
  Administered 2015-12-28: 10 mL via INTRAVENOUS
  Filled 2015-12-28: qty 10

## 2015-12-28 NOTE — Telephone Encounter (Signed)
s.w. pt and advised on April thru April 2018 appts

## 2015-12-28 NOTE — Progress Notes (Signed)
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  CURRENT THERAPY: Observation.   INTERVAL HISTORY: [name] TORRENTE 75 y.o. male returns to the clinic today for follow-up visit. The patient is feeling fine today with no specific complaints. He denied having any significant weight loss or night sweats. The patient denied having any chest pain, shortness of breath, cough or hemoptysis. He has no nausea or vomiting. He has repeat CBC, comprehensive metabolic panel and LDH performed earlier today and he is here for evaluation and discussion of his lab results.    MEDICAL HISTORY: Past Medical History  Diagnosis Date  . Hypertension   . Femur fracture ([place])   . Hip fracture ([place])   . Other malignant lymphomas, unspecified site, extranodal and solid organ sites   . Malignant neoplasm of pancreas, part unspecified     ALLERGIES:  is allergic to iohexol and sulfa antibiotics.  MEDICATIONS:  Current Outpatient Prescriptions  Medication Sig Dispense Refill  . Aflibercept (EYLEA) 2 MG/0.05ML SOLN Inject into the eye as directed. Take every 6 wks in eye doctor office.    Marland Kitchen amLODipine (NORVASC) 5 MG tablet Take 5 mg by mouth daily.  0  . Ascorbic Acid (VITAMIN C) 1000 MG tablet Take 2,000 mg by mouth daily.      . Calcium-Magnesium (CALMAG THINS PO) Take by mouth.    . cholecalciferol (VITAMIN D) 1000 UNITS tablet Take 15,000 Units by mouth daily.     Marland Kitchen lisinopril-hydrochlorothiazide (PRINZIDE,ZESTORETIC) 20-12.5  MG per tablet Take 2 tablets by mouth daily.     . Multiple Vitamin (MULTIVITAMIN) tablet Take 6 tablets by mouth daily.      Marland Kitchen UNABLE TO FIND Take 1 capsule by mouth daily. Med Name:TUMERIC    . vitamin A 10000 UNIT capsule Take 10,000 Units by mouth daily.    . Zinc 30 MG TABS Take 1 tablet by mouth every morning.    . Probiotic Product (PROBIOTIC FORMULA PO) Take 1 tablet by mouth daily.       No current facility-administered medications for this visit.   Facility-Administered Medications Ordered in Other Visits  Medication Dose Route Frequency Provider Last Rate Last Dose  . sodium chloride 0.9 % injection 10 mL  10 mL Intravenous PRN Curt Bears, MD   10 mL at 12/29/14 1026    SURGICAL HISTORY:  Past Surgical History  Procedure Laterality Date  . Femur surgery Bilateral   . Shoulder surgery Left   . Leg surgery Left     REVIEW OF SYSTEMS:  A comprehensive review of systems was negative.   PHYSICAL EXAMINATION: General appearance: alert, cooperative and no distress Head: Normocephalic, without obvious abnormality, atraumatic Neck: no adenopathy, no JVD, supple, symmetrical, trachea midline and thyroid not enlarged, symmetric, no tenderness/mass/nodules Lymph nodes: Cervical, supraclavicular, and axillary nodes normal. Resp: clear to auscultation bilaterally Back: symmetric, no curvature. ROM normal. No CVA tenderness. Cardio: regular rate and rhythm, S1, S2 normal, no murmur, click, rub or gallop GI: Mildly tenderness to palpation in the epigastric area Extremities: extremities normal, atraumatic, no  cyanosis or edema Neurologic: Alert and oriented X 3, normal strength and tone. Normal symmetric reflexes. Normal coordination and gait  ECOG PERFORMANCE STATUS: 1 - Symptomatic but completely ambulatory  Blood pressure 144/81, pulse 85, temperature 98.2 F (36.8 C), temperature source Oral, resp. rate 18, height 5\' 9"  (1.753 m), weight 182 lb 9.6 oz (82.827 kg), SpO2 99  %.  LABORATORY DATA: Lab Results  Component Value Date   WBC 5.8 12/29/2014   HGB 15.9 12/29/2014   HCT 48.5 12/29/2014   MCV 88.2 12/29/2014   PLT 159 12/29/2014      Chemistry      Component Value Date/Time   NA 141 12/29/2014 0841   NA 140 09/15/2011 1353   NA 140 03/06/2011 1249   K 4.1 12/29/2014 0841   K 4.4 09/15/2011 1353   K 4.3 03/06/2011 1249   CL 103 11/21/2012 0930   CL 102 09/15/2011 1353   CL 98 03/06/2011 1249   CO2 29 12/29/2014 0841   CO2 25 09/15/2011 1353   CO2 28 03/06/2011 1249   BUN 15.6 12/29/2014 0841   BUN 14 09/15/2011 1353   BUN 16 03/06/2011 1249   CREATININE 1.2 12/29/2014 0841   CREATININE 1.11 09/15/2011 1353   CREATININE 1.4* 03/06/2011 1249      Component Value Date/Time   CALCIUM 9.6 12/29/2014 0841   CALCIUM 9.6 09/15/2011 1353   CALCIUM 9.4 03/06/2011 1249   ALKPHOS 65 12/29/2014 0841   ALKPHOS 53 09/15/2011 1353   ALKPHOS 61 03/06/2011 1249   AST 25 12/29/2014 0841   AST 21 09/15/2011 1353   AST 30 03/06/2011 1249   ALT 25 12/29/2014 0841   ALT 15 09/15/2011 1353   ALT 25 03/06/2011 1249   BILITOT 1.08 12/29/2014 0841   BILITOT 1.0 09/15/2011 1353   BILITOT 1.10 03/06/2011 1249       RADIOGRAPHIC STUDIES: No results found.  ASSESSMENT AND PLAN: This is a very pleasant 75 years old white male with history of large B-cell non-Hodgkin lymphoma presenting with a large mass close to the head of the pancreas status post low-dose systemic chemotherapy with CHOP/Rituxan for 4 cycles in addition to hyperthermic therapy in [place] with almost complete response. The patient has been observation for the last few years with no significant evidence for disease recurrence.  CBC today is unremarkable. Comprehensive metabolic panel and LDH are still pending. I would see the patient back for follow-up visit in one year with repeat CBC, comprehensive metabolic panel and LDH. The patient was advised to call immediately if he has any  concerning symptoms in the interval. He will continue Port-A-Cath flush every 8 weeks.  The patient voices understanding of current disease status and treatment options and is in agreement with the current care plan.  All questions were answered. The patient knows to call the clinic with any problems, questions or concerns. We can certainly see the patient much sooner if necessary.  Disclaimer: This note was dictated with voice recognition software. Similar sounding words can inadvertently be transcribed and may not be corrected upon review.

## 2016-01-04 DIAGNOSIS — H34813 Central retinal vein occlusion, bilateral, with macular edema: Secondary | ICD-10-CM | POA: Diagnosis not present

## 2016-01-04 DIAGNOSIS — H35372 Puckering of macula, left eye: Secondary | ICD-10-CM | POA: Diagnosis not present

## 2016-01-04 DIAGNOSIS — H43813 Vitreous degeneration, bilateral: Secondary | ICD-10-CM | POA: Diagnosis not present

## 2016-02-01 DIAGNOSIS — H43813 Vitreous degeneration, bilateral: Secondary | ICD-10-CM | POA: Diagnosis not present

## 2016-02-01 DIAGNOSIS — H34813 Central retinal vein occlusion, bilateral, with macular edema: Secondary | ICD-10-CM | POA: Diagnosis not present

## 2016-02-01 DIAGNOSIS — H35372 Puckering of macula, left eye: Secondary | ICD-10-CM | POA: Diagnosis not present

## 2016-02-22 ENCOUNTER — Ambulatory Visit (HOSPITAL_BASED_OUTPATIENT_CLINIC_OR_DEPARTMENT_OTHER): Payer: Medicare HMO

## 2016-02-22 VITALS — BP 149/82 | HR 71 | Resp 18

## 2016-02-22 DIAGNOSIS — Z452 Encounter for adjustment and management of vascular access device: Secondary | ICD-10-CM

## 2016-02-22 DIAGNOSIS — C8251 Diffuse follicle center lymphoma, lymph nodes of head, face, and neck: Secondary | ICD-10-CM

## 2016-02-22 DIAGNOSIS — C8330 Diffuse large B-cell lymphoma, unspecified site: Secondary | ICD-10-CM

## 2016-02-22 DIAGNOSIS — Z95828 Presence of other vascular implants and grafts: Secondary | ICD-10-CM | POA: Insufficient documentation

## 2016-02-22 MED ORDER — SODIUM CHLORIDE 0.9 % IJ SOLN
10.0000 mL | INTRAMUSCULAR | Status: DC | PRN
  Administered 2016-02-22: 10 mL via INTRAVENOUS
  Filled 2016-02-22: qty 10

## 2016-02-22 MED ORDER — HEPARIN SOD (PORK) LOCK FLUSH 100 UNIT/ML IV SOLN
500.0000 [IU] | Freq: Once | INTRAVENOUS | Status: AC | PRN
  Administered 2016-02-22: 500 [IU] via INTRAVENOUS
  Filled 2016-02-22: qty 5

## 2016-02-22 NOTE — Patient Instructions (Signed)

## 2016-02-29 DIAGNOSIS — H35372 Puckering of macula, left eye: Secondary | ICD-10-CM | POA: Diagnosis not present

## 2016-02-29 DIAGNOSIS — H43813 Vitreous degeneration, bilateral: Secondary | ICD-10-CM | POA: Diagnosis not present

## 2016-02-29 DIAGNOSIS — H34813 Central retinal vein occlusion, bilateral, with macular edema: Secondary | ICD-10-CM | POA: Diagnosis not present

## 2016-03-15 DIAGNOSIS — E559 Vitamin D deficiency, unspecified: Secondary | ICD-10-CM | POA: Diagnosis not present

## 2016-03-15 DIAGNOSIS — Z0000 Encounter for general adult medical examination without abnormal findings: Secondary | ICD-10-CM | POA: Diagnosis not present

## 2016-03-15 DIAGNOSIS — Z8572 Personal history of non-Hodgkin lymphomas: Secondary | ICD-10-CM | POA: Diagnosis not present

## 2016-03-15 DIAGNOSIS — M79641 Pain in right hand: Secondary | ICD-10-CM | POA: Diagnosis not present

## 2016-03-15 DIAGNOSIS — I10 Essential (primary) hypertension: Secondary | ICD-10-CM | POA: Diagnosis not present

## 2016-03-15 DIAGNOSIS — Z1389 Encounter for screening for other disorder: Secondary | ICD-10-CM | POA: Diagnosis not present

## 2016-03-15 DIAGNOSIS — H348132 Central retinal vein occlusion, bilateral, stable: Secondary | ICD-10-CM | POA: Diagnosis not present

## 2016-03-20 ENCOUNTER — Other Ambulatory Visit: Payer: Self-pay | Admitting: Family Medicine

## 2016-03-20 ENCOUNTER — Ambulatory Visit
Admission: RE | Admit: 2016-03-20 | Discharge: 2016-03-20 | Disposition: A | Discharge status: Home / Self Care | Payer: Medicare HMO | Source: Ambulatory Visit | Attending: Family Medicine | Admitting: Family Medicine

## 2016-03-20 DIAGNOSIS — Z789 Other specified health status: Secondary | ICD-10-CM

## 2016-03-20 DIAGNOSIS — R609 Edema, unspecified: Secondary | ICD-10-CM

## 2016-03-20 DIAGNOSIS — M179 Osteoarthritis of knee, unspecified: Secondary | ICD-10-CM | POA: Diagnosis not present

## 2016-03-20 DIAGNOSIS — M79641 Pain in right hand: Secondary | ICD-10-CM

## 2016-04-12 DIAGNOSIS — H35372 Puckering of macula, left eye: Secondary | ICD-10-CM | POA: Diagnosis not present

## 2016-04-12 DIAGNOSIS — H43813 Vitreous degeneration, bilateral: Secondary | ICD-10-CM | POA: Diagnosis not present

## 2016-04-12 DIAGNOSIS — H34813 Central retinal vein occlusion, bilateral, with macular edema: Secondary | ICD-10-CM | POA: Diagnosis not present

## 2016-04-18 ENCOUNTER — Ambulatory Visit (HOSPITAL_BASED_OUTPATIENT_CLINIC_OR_DEPARTMENT_OTHER): Payer: Medicare HMO

## 2016-04-18 DIAGNOSIS — Z452 Encounter for adjustment and management of vascular access device: Secondary | ICD-10-CM | POA: Diagnosis not present

## 2016-04-18 DIAGNOSIS — C8330 Diffuse large B-cell lymphoma, unspecified site: Secondary | ICD-10-CM

## 2016-04-18 DIAGNOSIS — Z95828 Presence of other vascular implants and grafts: Secondary | ICD-10-CM

## 2016-04-18 DIAGNOSIS — C8251 Diffuse follicle center lymphoma, lymph nodes of head, face, and neck: Secondary | ICD-10-CM

## 2016-04-18 MED ORDER — SODIUM CHLORIDE 0.9 % IJ SOLN
10.0000 mL | INTRAMUSCULAR | Status: DC | PRN
  Administered 2016-04-18: 10 mL via INTRAVENOUS
  Filled 2016-04-18: qty 10

## 2016-04-18 MED ORDER — HEPARIN SOD (PORK) LOCK FLUSH 100 UNIT/ML IV SOLN
500.0000 [IU] | Freq: Once | INTRAVENOUS | Status: AC | PRN
  Administered 2016-04-18: 500 [IU] via INTRAVENOUS
  Filled 2016-04-18: qty 5

## 2016-04-24 ENCOUNTER — Other Ambulatory Visit: Payer: Self-pay | Admitting: Dermatology

## 2016-04-24 DIAGNOSIS — D239 Other benign neoplasm of skin, unspecified: Secondary | ICD-10-CM | POA: Diagnosis not present

## 2016-04-24 DIAGNOSIS — L570 Actinic keratosis: Secondary | ICD-10-CM | POA: Diagnosis not present

## 2016-04-24 DIAGNOSIS — L821 Other seborrheic keratosis: Secondary | ICD-10-CM | POA: Diagnosis not present

## 2016-04-24 DIAGNOSIS — C44619 Basal cell carcinoma of skin of left upper limb, including shoulder: Secondary | ICD-10-CM | POA: Diagnosis not present

## 2016-04-24 DIAGNOSIS — C44519 Basal cell carcinoma of skin of other part of trunk: Secondary | ICD-10-CM | POA: Diagnosis not present

## 2016-05-02 DIAGNOSIS — I10 Essential (primary) hypertension: Secondary | ICD-10-CM | POA: Diagnosis not present

## 2016-05-02 DIAGNOSIS — H18411 Arcus senilis, right eye: Secondary | ICD-10-CM | POA: Diagnosis not present

## 2016-05-02 DIAGNOSIS — H348122 Central retinal vein occlusion, left eye, stable: Secondary | ICD-10-CM | POA: Diagnosis not present

## 2016-05-02 DIAGNOSIS — H353130 Nonexudative age-related macular degeneration, bilateral, stage unspecified: Secondary | ICD-10-CM | POA: Diagnosis not present

## 2016-05-02 DIAGNOSIS — Z961 Presence of intraocular lens: Secondary | ICD-10-CM | POA: Diagnosis not present

## 2016-05-02 DIAGNOSIS — H348112 Central retinal vein occlusion, right eye, stable: Secondary | ICD-10-CM | POA: Diagnosis not present

## 2016-05-02 DIAGNOSIS — H1859 Other hereditary corneal dystrophies: Secondary | ICD-10-CM | POA: Diagnosis not present

## 2016-05-24 DIAGNOSIS — H34813 Central retinal vein occlusion, bilateral, with macular edema: Secondary | ICD-10-CM | POA: Diagnosis not present

## 2016-05-24 DIAGNOSIS — H35372 Puckering of macula, left eye: Secondary | ICD-10-CM | POA: Diagnosis not present

## 2016-05-24 DIAGNOSIS — H43813 Vitreous degeneration, bilateral: Secondary | ICD-10-CM | POA: Diagnosis not present

## 2016-06-13 ENCOUNTER — Ambulatory Visit (HOSPITAL_BASED_OUTPATIENT_CLINIC_OR_DEPARTMENT_OTHER): Payer: Medicare HMO

## 2016-06-13 DIAGNOSIS — C8251 Diffuse follicle center lymphoma, lymph nodes of head, face, and neck: Secondary | ICD-10-CM

## 2016-06-13 DIAGNOSIS — C8330 Diffuse large B-cell lymphoma, unspecified site: Secondary | ICD-10-CM | POA: Diagnosis not present

## 2016-06-13 DIAGNOSIS — Z452 Encounter for adjustment and management of vascular access device: Secondary | ICD-10-CM | POA: Diagnosis not present

## 2016-06-13 DIAGNOSIS — Z95828 Presence of other vascular implants and grafts: Secondary | ICD-10-CM

## 2016-06-13 MED ORDER — SODIUM CHLORIDE 0.9 % IJ SOLN
10.0000 mL | INTRAMUSCULAR | Status: DC | PRN
  Administered 2016-06-13: 10 mL via INTRAVENOUS
  Filled 2016-06-13: qty 10

## 2016-06-13 MED ORDER — HEPARIN SOD (PORK) LOCK FLUSH 100 UNIT/ML IV SOLN
500.0000 [IU] | Freq: Once | INTRAVENOUS | Status: AC | PRN
  Administered 2016-06-13: 500 [IU] via INTRAVENOUS
  Filled 2016-06-13: qty 5

## 2016-06-13 NOTE — Patient Instructions (Signed)

## 2016-06-16 DIAGNOSIS — M7502 Adhesive capsulitis of left shoulder: Secondary | ICD-10-CM | POA: Diagnosis not present

## 2016-06-19 ENCOUNTER — Ambulatory Visit (INDEPENDENT_AMBULATORY_CARE_PROVIDER_SITE_OTHER): Payer: Medicare HMO | Admitting: Orthopedic Surgery

## 2016-06-19 DIAGNOSIS — M25522 Pain in left elbow: Secondary | ICD-10-CM | POA: Diagnosis not present

## 2016-06-19 DIAGNOSIS — M25521 Pain in right elbow: Secondary | ICD-10-CM

## 2016-06-19 DIAGNOSIS — M1712 Unilateral primary osteoarthritis, left knee: Secondary | ICD-10-CM

## 2016-06-19 DIAGNOSIS — M7502 Adhesive capsulitis of left shoulder: Secondary | ICD-10-CM

## 2016-06-19 DIAGNOSIS — M1711 Unilateral primary osteoarthritis, right knee: Secondary | ICD-10-CM

## 2016-06-27 DIAGNOSIS — M7502 Adhesive capsulitis of left shoulder: Secondary | ICD-10-CM | POA: Diagnosis not present

## 2016-06-27 DIAGNOSIS — M75122 Complete rotator cuff tear or rupture of left shoulder, not specified as traumatic: Secondary | ICD-10-CM | POA: Diagnosis not present

## 2016-06-27 DIAGNOSIS — M958 Other specified acquired deformities of musculoskeletal system: Secondary | ICD-10-CM | POA: Diagnosis not present

## 2016-06-27 DIAGNOSIS — M25712 Osteophyte, left shoulder: Secondary | ICD-10-CM | POA: Diagnosis not present

## 2016-06-27 DIAGNOSIS — M7542 Impingement syndrome of left shoulder: Secondary | ICD-10-CM | POA: Diagnosis not present

## 2016-07-05 DIAGNOSIS — Z0000 Encounter for general adult medical examination without abnormal findings: Secondary | ICD-10-CM | POA: Diagnosis not present

## 2016-07-05 DIAGNOSIS — I10 Essential (primary) hypertension: Secondary | ICD-10-CM | POA: Diagnosis not present

## 2016-07-06 ENCOUNTER — Inpatient Hospital Stay (INDEPENDENT_AMBULATORY_CARE_PROVIDER_SITE_OTHER): Payer: Medicare HMO | Admitting: Orthopedic Surgery

## 2016-07-10 ENCOUNTER — Telehealth (INDEPENDENT_AMBULATORY_CARE_PROVIDER_SITE_OTHER): Payer: Self-pay | Admitting: Orthopedic Surgery

## 2016-07-10 ENCOUNTER — Encounter (INDEPENDENT_AMBULATORY_CARE_PROVIDER_SITE_OTHER): Payer: Self-pay | Admitting: Radiology

## 2016-07-10 NOTE — Telephone Encounter (Signed)
This was faxed to [facility] [phone]. This is ok per MD, faxed confirmed with [facility] over the phone.

## 2016-07-12 ENCOUNTER — Ambulatory Visit (INDEPENDENT_AMBULATORY_CARE_PROVIDER_SITE_OTHER): Payer: Medicare HMO

## 2016-07-12 ENCOUNTER — Ambulatory Visit (INDEPENDENT_AMBULATORY_CARE_PROVIDER_SITE_OTHER): Payer: Self-pay

## 2016-07-12 ENCOUNTER — Ambulatory Visit (INDEPENDENT_AMBULATORY_CARE_PROVIDER_SITE_OTHER): Payer: Medicare HMO | Admitting: Family

## 2016-07-12 DIAGNOSIS — Z9889 Other specified postprocedural states: Secondary | ICD-10-CM | POA: Diagnosis not present

## 2016-07-12 DIAGNOSIS — M79605 Pain in left leg: Secondary | ICD-10-CM

## 2016-07-12 DIAGNOSIS — M25562 Pain in left knee: Secondary | ICD-10-CM | POA: Diagnosis not present

## 2016-07-12 DIAGNOSIS — M25552 Pain in left hip: Secondary | ICD-10-CM | POA: Diagnosis not present

## 2016-07-12 NOTE — Progress Notes (Signed)
Office Visit Note   Patient: [name] [name]           Date of Birth: Aug 06, 1941           MRN: [id number] Visit Date: 07/12/2016              Requested by: [name], MD [address] [place], [place] [address] PCP: [name], MD   Assessment & Plan: Visit Diagnoses:  1. History of arthroscopy of left shoulder   2. Pain in left leg   3. Pain of left hip joint   4. Acute pain of left knee     Plan: Patient will continue to work on range of motion left shoulder. Reassured no fracture to the left lower extremity. Will use ice and anti-inflammatories for left hip pain. Have sent in referral to physical therapy for left shoulder. Follow-up in office in 2 weeks.  Follow-Up Instructions: Return in about 2 weeks (around 07/26/2016).   Orders:  Orders Placed This Encounter  Procedures  . XR FEMUR MIN 2 VIEWS LEFT  . XR Knee 1-2 Views Left   No orders of the defined types were placed in this encounter.     Procedures: No procedures performed   Clinical Data: No additional findings.   Subjective: Chief Complaint  Patient presents with  . Left Leg - Pain    DOI 07/12/16 s/p fall down steps this am    Pt states that he thinks he broke his left leg. S/p a fall this morning going down the steps and that is was an acute onset of pain and he is not able to move this leg. When asked to discribe where he is hurting it it the lateral side proximal end of left femur. Pt is in a wheelchair now with ice applied to his hip. States " I should have gone to the hospital but I already had an appt here"  Patient states he fell down a flight of stairs at 8:00 this morning just prior to his appointment with [place]. Was able to walk to his car. Upon arriving at the appointment noticed swelling to the left hip and increased pain with ambulation. He is in a wheelchair now upon my assessment. Complaining of knee pain as well as thigh and hip pain in the left lower extremity.  Notes he has abrasions to the left knee from the fall.  His also status post arthroscopy to the left shoulder. Reports constant aching pain. No improvement in range of motion. States has been working on home exercises and range of motion including pendulum swings as well as walking his arm up the wall.  Review of Systems  Constitutional: Negative for chills and fever.  Musculoskeletal: Positive for arthralgias, gait problem, joint swelling and myalgias.  Skin: Positive for wound.     Objective: Vital Signs: There were no vitals taken for this visit.  Physical Exam  Ortho Exam   Upon examination the left shoulder has abduction to 90. Does still have some pain with passive range of motion. Portals are clean and dry. Left hip: There is swelling and erythema this is tender to palpation. Does have full range of motion flexion extension and rotation of the left hip passively with minimal pain. His left thigh nontender to palpation. Left knee: There are report to abrasions to both 2 cm in diameter which are both superficial no bleeding at this time. Medial and lateral joint lines are minimally tender to palpation has active  full range of motion. Stable to varus and valgus stress  Specialty Comments:  No specialty comments available.  Imaging: Xr Femur Min 2 Views Left  Result Date: 07/12/2016 Two views of left femur are negative for fracture. Stable alignment of hardware. No acute findings.   Xr Knee 1-2 Views Left  Result Date: 07/12/2016 Two views of the left knee are negative for fracture. No bony abnormality.    PMFS History: Patient Active Problem List   Diagnosis Date Noted  . History of arthroscopy of left shoulder 07/12/2016  . Port catheter in place 02/22/2016  . 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 Vitamin D deficiency 11/21/2012  . Lymphoma ([place]) 06/13/2012   Past Medical History:  Diagnosis Date  . Femur fracture ([place])   .  Hip fracture ([place])   . Hypertension   . Malignant neoplasm of pancreas, part unspecified ([place])   . Other malignant lymphomas, unspecified site, extranodal and solid organ sites     No family history on file.  Past Surgical History:  Procedure Laterality Date  . FEMUR SURGERY Bilateral   . LEG SURGERY Left   . SHOULDER SURGERY Left    Social History   Occupational History  . Not on file.   Social History Main Topics  . Smoking status: Never Smoker  . Smokeless tobacco: Not on file  . Alcohol use No  . Drug use: No  . Sexual activity: Not on file

## 2016-07-25 ENCOUNTER — Ambulatory Visit: Payer: Medicare HMO | Admitting: Physical Therapy

## 2016-07-25 ENCOUNTER — Ambulatory Visit: Payer: Medicare HMO

## 2016-07-26 ENCOUNTER — Encounter (INDEPENDENT_AMBULATORY_CARE_PROVIDER_SITE_OTHER): Payer: Self-pay | Admitting: Family

## 2016-07-26 ENCOUNTER — Ambulatory Visit: Payer: Medicare HMO | Attending: Family | Admitting: Physical Therapy

## 2016-07-26 ENCOUNTER — Ambulatory Visit: Payer: Self-pay

## 2016-07-26 ENCOUNTER — Telehealth (INDEPENDENT_AMBULATORY_CARE_PROVIDER_SITE_OTHER): Payer: Self-pay | Admitting: Orthopedic Surgery

## 2016-07-26 ENCOUNTER — Ambulatory Visit (INDEPENDENT_AMBULATORY_CARE_PROVIDER_SITE_OTHER): Payer: Medicare HMO | Admitting: Family

## 2016-07-26 ENCOUNTER — Ambulatory Visit (INDEPENDENT_AMBULATORY_CARE_PROVIDER_SITE_OTHER): Payer: Medicare HMO

## 2016-07-26 VITALS — Ht 69.0 in | Wt 182.0 lb

## 2016-07-26 DIAGNOSIS — G8929 Other chronic pain: Secondary | ICD-10-CM | POA: Insufficient documentation

## 2016-07-26 DIAGNOSIS — M25552 Pain in left hip: Secondary | ICD-10-CM

## 2016-07-26 DIAGNOSIS — R293 Abnormal posture: Secondary | ICD-10-CM | POA: Insufficient documentation

## 2016-07-26 DIAGNOSIS — M25562 Pain in left knee: Secondary | ICD-10-CM | POA: Insufficient documentation

## 2016-07-26 DIAGNOSIS — M25612 Stiffness of left shoulder, not elsewhere classified: Secondary | ICD-10-CM

## 2016-07-26 DIAGNOSIS — M6281 Muscle weakness (generalized): Secondary | ICD-10-CM | POA: Diagnosis not present

## 2016-07-26 DIAGNOSIS — M25512 Pain in left shoulder: Secondary | ICD-10-CM | POA: Diagnosis not present

## 2016-07-26 DIAGNOSIS — R2681 Unsteadiness on feet: Secondary | ICD-10-CM | POA: Insufficient documentation

## 2016-07-26 NOTE — Progress Notes (Signed)
Office Visit Note   Patient: [name] [name]           Date of Birth: [birth year]           MRN: [id number] Visit Date: 07/26/2016              Requested by: [name], MD [address] [place], [place] [address] PCP: [name], MD   Assessment & Plan: Visit Diagnoses:  1. Acute pain of left knee   2. Pain in left hip     Plan: We'll begin physical therapy for the left shoulder tomorrow. States will ask them to assist with knee and hip exercises as well. He will work aggressively on this. Anticipate releasing him after next visit.  Follow-Up Instructions: Return in about 4 weeks (around 08/23/2016).   Orders:  Orders Placed This Encounter  Procedures  . XR HIP UNILAT W OR W/O PELVIS 2-3 VIEWS LEFT  . DG Knee 1-2 Views Left  . XR Knee 1-2 Views Left   No orders of the defined types were placed in this encounter.     Procedures: No procedures performed   Clinical Data: No additional findings.   Subjective: Chief Complaint  Patient presents with  . Left Shoulder - Routine Post Op    10/0/17 left shoulder scope and debridement     Patient is s/p a left shoulder scope and debridement.     Review of Systems  Constitutional: Negative for chills and fever.  Musculoskeletal: Positive for arthralgias and myalgias. Negative for gait problem.  All other systems reviewed and are negative.    Objective: Vital Signs: Ht 5\' 9"  (1.753 m)   Wt 182 lb (82.6 kg)   BMI 26.88 kg/m   Physical Exam Physical Exam  Constitutional: He is oriented to person, place, and time. He appears well-developed and well-nourished.  Pulmonary/Chest: Effort normal.  Neurological: He is alert and oriented to person, place, and time.  Psychiatric: He has a normal mood and affect.  Nursing note reviewed.   Left Knee Exam  Left knee exam is normal.  Tenderness  The patient is experiencing no tenderness.     Muscle Strength   The patient has normal  left knee strength.  Tests  Varus: negative Valgus: negative  Other  Swelling: none   Left Hip Exam   Tenderness  The patient is experiencing tenderness in the greater trochanter and lateral.  Range of Motion  The patient has normal left hip ROM.  Muscle Strength  The patient has normal left hip strength.   Other  Sensation: normal  Comments:  Various stages of healing ecchymosis over left hip and thigh, palpable hematoma over greater trochanter   Left Shoulder Exam   Tenderness  The patient is experiencing no tenderness.     Range of Motion  Active Abduction: 90   Tests  Impingement: negative  Other  Erythema: absent       Specialty Comments:  No specialty comments available.  Imaging: Xr Hip Unilat W Or W/o Pelvis 2-3 Views Left  Result Date: 07/26/2016 2 views of the left hip are unremarkable. No fracture or acute bony abnormality. Hardware stable.  Xr Knee 1-2 Views Left  Result Date: 07/26/2016 2 views of the left knee are negative for fracture or bony abnormality. Hardware stable.    PMFS History: Patient Active Problem List   Diagnosis Date Noted  . History of arthroscopy of left shoulder 07/12/2016  . Port catheter in place  02/22/2016  . 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 Vitamin D deficiency 11/21/2012  . Lymphoma ([place]) 06/13/2012   Past Medical History:  Diagnosis Date  . Femur fracture ([place])   . Hip fracture ([place])   . Hypertension   . Malignant neoplasm of pancreas, part unspecified   . Other malignant lymphomas, unspecified site, extranodal and solid organ sites     No family history on file.  Past Surgical History:  Procedure Laterality Date  . FEMUR SURGERY Bilateral   . LEG SURGERY Left   . SHOULDER SURGERY Left    Social History   Occupational History  . Not on file.   Social History Main Topics  . Smoking status: Never Smoker  . Smokeless tobacco: Not on file  .  Alcohol use No  . Drug use: No  . Sexual activity: Not on file

## 2016-07-26 NOTE — Therapy (Signed)
[place], [place], [address] Phone: [phone]   Fax:  [phone]  Physical Therapy Treatment  Patient Details  Name: [name] [name] MRN: [id number] Date of Birth: 1941/04/22 Referring Provider: Suzan Slick, NP  Encounter Date: 07/26/2016      PT End of Session - 07/26/16 1515    Visit Number 1   Number of Visits 17   Date for PT Re-Evaluation 09/20/16   Authorization Type Medicare: Kx by 15th visit, Progress note by 10th visit   PT Start Time 1330   PT Stop Time 1415   PT Time Calculation (min) 45 min   Activity Tolerance Patient tolerated treatment well   Behavior During Therapy [facility] for tasks assessed/performed      Past Medical History:  Diagnosis Date  . Femur fracture ([place])   . Hip fracture ([place])   . Hypertension   . Malignant neoplasm of pancreas, part unspecified   . Other malignant lymphomas, unspecified site, extranodal and solid organ sites     Past Surgical History:  Procedure Laterality Date  . FEMUR SURGERY Bilateral   . LEG SURGERY Left   . SHOULDER SURGERY Left     There were no vitals filed for this visit.      Subjective Assessment - 07/26/16 1341    Subjective pt is a 75 y.o M with CC of L shoulder pain with that has a shoulder scope surgery on 06/27/2016 they released adhesion in the shoulder, and removed fragments. pt reports mostly some dull pain with occasional sharp pain inthe shoulder/ bicep with no report of referral to the hand and no report of N/T. reports being conssiste with pendulum exercises and attemping to wall walking exercise but has difficulty with it.   unrelated to the shoulde reported a significant bruising the L hip/ knee that occurred 2 weeks ago down a fall of 12 steps. reported getting imaging with no report of fx.    Limitations Lifting;Walking;House hold activities;Standing   How long can you sit comfortably? 1 hour   How long can you stand  comfortably? 30 min   How long can you walk comfortably? 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 Yes   Pain Score 2    Pain Location Shoulder   Pain Orientation Left   Pain Descriptors / Indicators Aching;Sore;Dull   Pain Type Surgical pain   Pain Onset More than a month ago   Aggravating Factors  using the LUE, reaching, pulling, carrying lifting,    Pain Relieving Factors self massage, resting, infrared unit (rainbow unit),    Multiple Pain Sites Yes   Pain Score 3   Pain Location Leg   Pain Orientation Left;Proximal;Lateral   Pain Descriptors / Indicators Aching;Sore   Pain Type Chronic pain   Pain Onset 1 to 4 weeks ago   Pain Frequency Constant   Aggravating Factors  going up steps,    Pain Relieving Factors N/A            [facility] PT Assessment - 07/26/16 1332      Assessment   Medical Diagnosis S/P L shoulder scope.   Referring Provider Suzan Slick, NP   Onset Date/Surgical Date --  06/27/2016   Hand Dominance Right   Next MD Visit 08/25/2016   Prior Therapy yes     Precautions   Precautions None     Restrictions   Weight Bearing Restrictions No     Balance Screen   Has the patient fallen in the past 6 months Yes   How many times? 1   Has the patient had a decrease in activity level because of a fear of falling?  Yes   Is the patient reluctant to leave their home because of a fear of falling?  Yes     [place] residence   Living Arrangements Alone   Type of [place] to enter   [place] Two level   Alternate Level Stairs-Number of Steps 15   Alternate Level Stairs-Rails Right     Prior Function   Level of Independence Independent;Independent with  basic ADLs   Vocation Other (comment)  runs small clinic     Cognition   Overall Cognitive Status Within Functional Limits for tasks assessed     Observation/Other Assessments   Focus on Therapeutic Outcomes (FOTO)  48% limited  predicted 36% limited     Posture/Postural Control   Posture/Postural Control Postural limitations   Postural Limitations Rounded Shoulders;Forward head     ROM / Strength   AROM / PROM / Strength AROM;PROM;Strength     AROM   AROM Assessment Site Shoulder   Right/Left Shoulder Right;Left   Left Shoulder Extension 48 Degrees  ERP   Left Shoulder Flexion 66 Degrees   Left Shoulder ABduction 67 Degrees  ERP   Left Shoulder Internal Rotation --  to stomach: ERP   Left Shoulder External Rotation -20 Degrees  from neutral testing with shoulder in neutral; ERP     PROM   PROM Assessment Site Shoulder   Right/Left Shoulder Left   Left Shoulder Flexion 96 Degrees  ERP   Left Shoulder ABduction 76 Degrees  ERP   Left Shoulder External Rotation 10 Degrees  ERP     Strength   Overall Strength Comments L shoulder strength not assessed based on limited ROM and pain    Strength Assessment Site Shoulder;Hand   Right/Left Shoulder Left   Right Hand Grip (lbs) 75  75,75,75   Left Hand Grip (lbs) 75.3  72,78,76     Palpation   Palpation comment soreness in the deltoid, tightness in the L upper trap/ levator scapulae     Ambulation/Gait   Gait Pattern Step-through pattern;Decreased stride length;Trendelenburg;Antalgic;Lateral hip instability;Decreased trunk rotation;Trunk flexed;Wide base of support                             PT Education - 07/26/16 1513    Education provided Yes   Education Details evaluation findings, POC, goals, HEP with proper form and rationale   Person(s) Educated Patient   Methods Explanation;Verbal cues;Handout   Comprehension Verbalized understanding;Verbal cues required          PT Short Term  Goals - 07/26/16 1524      PT SHORT TERM GOAL #1   Title pt will be I with inital HEP (08/25/2016)   Time 4   Period Weeks   Status New     PT SHORT TERM GOAL #2   Title pt will be able to verbalize and demo techniques to prevent and reduce L shoulder pain and inflammation via  RICE and HEP (08/25/2016)   Time 4   Period Weeks   Status New     PT SHORT TERM GOAL #3   Title he will increase L shoulder AROM  flexion/ abduction to >/= 90 degrees and ER to >/= 5 with </= 5/10 pain to promote functional mobility (08/25/2016)   Baseline ER was -20 degrees from neutral (assessed in neutral with elbow at side)   Time 4   Period Weeks   Status New     PT SHORT TERM GOAL #4   Title he will increase his grip strength by >/= 6# in the L to demonstrate improvement in shoulder function (08/25/2016)   Time 4   Period Weeks   Status New           PT Long Term Goals - 07/26/16 1528      PT LONG TERM GOAL #1   Title He will be I with all HEP given as of last visit (09/20/2016)   Time 8   Period Weeks   Status New     PT LONG TERM GOAL #2   Title pt will increase L shoulder AROM flexion/ abduction to >/= 110 degrees and ER to >/= 30 degrees with </= 1/10 pain for functional mobility required for ADLs (09/20/2016)   Time 8   Period Weeks   Status New     PT LONG TERM GOAL #3   Title he will increase L shoulder strength to >/= 4-/5 in all  planes for functional lifting and carrying activities required for work related activities and ADLs (09/20/2016)   Time 8   Period Weeks   Status New     PT LONG TERM GOAL #4   Title pt will be able to lift and lower >/= 8# to and from an over head shelf with </= 1/10 pain for functional mobility, cooking/ cleaning, and 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 (09/20/2016)   Time 8   Period Weeks   Status New     PT LONG TERM GOAL #5   Title he will increase his FOTO score to </=36% limited to demonstrate improvement in function  at discharge (09/20/2016)   Time 8   Period Weeks   Status New               Plan - 07/26/16 1515    Clinical Impression Statement Mr. Bee presents to OPPT as a moderate complexity based on PMHx, and surigcal hx, fluctuating symptomology,  with CC of L shoulder pain following arthroscopic surgery on 06/27/2016. pt demonstrates limited L shoulder AROM/ PROM mobility in all planes with with ER being the most significant. Strength wasn't assessed based on limited ROM limitations and pain. he demonstrates pain around the deltoid, and upper trap/ levator scapulae. he would benefit from physical therapy to improve L shoulder mobility and strength, reduce pain and maximize his function by addressing the deficits listed.    Rehab Potential Good   PT Frequency 2x / week   PT Duration 8 weeks   PT Treatment/Interventions ADLs/Self Care Home Management;Cryotherapy;Electrical Stimulation;Iontophoresis 4mg /ml Dexamethasone;Moist Heat;Ultrasound;Therapeutic activities;Therapeutic exercise;Dry needling;Taping;Manual techniques;Patient/family education;Neuromuscular re-education   PT Next Visit Plan assess/ review HEP, PROM/ AAROM for L shoulder, shoulder mobs, manual PRN, scapular stabilizers,    PT Home Exercise Plan table slides flexion/ scaption angles, scapular retraction, wand ER   Consulted and Agree with Plan of Care Patient      Patient will benefit from skilled therapeutic  intervention in order to improve the following deficits and impairments:  Abnormal gait, Pain, Improper body mechanics, Postural dysfunction, Hypomobility, Decreased range of motion, Increased fascial restricitons, Decreased endurance, Decreased activity tolerance, Decreased strength  Visit Diagnosis: Chronic left shoulder pain - Plan: PT plan of care cert/re-cert  Stiffness of left shoulder, not elsewhere classified - Plan: PT plan of care cert/re-cert  Muscle weakness (generalized) - Plan: PT plan of care  cert/re-cert  Abnormal posture - Plan: PT plan of care cert/re-cert       G-Codes - 07/31/16 1535    Functional Assessment Tool Used FOTO/ clinical judgement   Functional Limitation Carrying, moving and handling objects   Carrying, Moving and Handling Objects Current Status [id number]) At least 40 percent but less than 60 percent impaired, limited or restricted   Carrying, Moving and Handling Objects Goal Status [id number]) At least 20 percent but less than 40 percent impaired, limited or restricted      Problem List Patient Active Problem List   Diagnosis Date Noted  . History of arthroscopy of left shoulder 07/12/2016  . Port catheter in place 02/22/2016  . 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 Vitamin D deficiency 11/21/2012  . Lymphoma ([place]) 06/13/2012   Starr Lake PT, DPT, LAT, ATC  07-31-16  3:36 PM      [facility] [address] [place], [place], [address] Phone: [phone]   Fax:  [phone]  Name: [name] [name] MRN: [id number] Date of Birth: May 23, 1941

## 2016-07-26 NOTE — Telephone Encounter (Signed)
Patient states his handicap plaque expires this month & would like to have a renewal paperwork application.

## 2016-07-28 DIAGNOSIS — H35372 Puckering of macula, left eye: Secondary | ICD-10-CM | POA: Diagnosis not present

## 2016-07-28 DIAGNOSIS — H34813 Central retinal vein occlusion, bilateral, with macular edema: Secondary | ICD-10-CM | POA: Diagnosis not present

## 2016-07-28 DIAGNOSIS — H43813 Vitreous degeneration, bilateral: Secondary | ICD-10-CM | POA: Diagnosis not present

## 2016-08-02 ENCOUNTER — Ambulatory Visit: Payer: Medicare HMO | Admitting: Physical Therapy

## 2016-08-04 ENCOUNTER — Ambulatory Visit: Payer: Medicare HMO | Admitting: Physical Therapy

## 2016-08-04 ENCOUNTER — Telehealth (INDEPENDENT_AMBULATORY_CARE_PROVIDER_SITE_OTHER): Payer: Self-pay | Admitting: Family

## 2016-08-04 DIAGNOSIS — G8929 Other chronic pain: Secondary | ICD-10-CM

## 2016-08-04 DIAGNOSIS — R293 Abnormal posture: Secondary | ICD-10-CM

## 2016-08-04 DIAGNOSIS — M25612 Stiffness of left shoulder, not elsewhere classified: Secondary | ICD-10-CM

## 2016-08-04 DIAGNOSIS — M25512 Pain in left shoulder: Principal | ICD-10-CM

## 2016-08-04 DIAGNOSIS — M6281 Muscle weakness (generalized): Secondary | ICD-10-CM

## 2016-08-04 NOTE — Therapy (Signed)
[place], [place], [address] Phone: [phone]   Fax:  [phone]  Physical Therapy Treatment  Patient Details  Name: [name] [name] MRN: [id number] Date of Birth: 04-10-1941 Referring Provider: Suzan Slick, NP  Encounter Date: 08/04/2016      PT End of Session - 08/04/16 1108    Visit Number 2   Number of Visits 17   Date for PT Re-Evaluation 09/20/16   Authorization Type Medicare: Kx by 15th visit, Progress note by 10th visit   PT Start Time 1018   PT Stop Time 1109   PT Time Calculation (min) 51 min   Activity Tolerance Patient tolerated treatment well   Behavior During Therapy [facility] for tasks assessed/performed      Past Medical History:  Diagnosis Date  . Femur fracture ([place])   . Hip fracture ([place])   . Hypertension   . Malignant neoplasm of pancreas, part unspecified   . Other malignant lymphomas, unspecified site, extranodal and solid organ sites     Past Surgical History:  Procedure Laterality Date  . FEMUR SURGERY Bilateral   . LEG SURGERY Left   . SHOULDER SURGERY Left     There were no vitals filed for this visit.      Subjective Assessment - 08/04/16 1024    Subjective "I have been doing my exercises more consistently, I can get my hand to my side burns"    Currently in Pain? Yes   Pain Score 1   6-7/10 wiht movement   Pain Location Shoulder   Pain Orientation Left   Pain Descriptors / Indicators Sore   Pain Type Surgical pain   Pain Onset More than a month ago   Pain Frequency Intermittent   Aggravating Factors  using the LUE, reaching pulling, carrying,    Pain Relieving Factors exercises, and massage                         [facility] Adult PT Treatment/Exercise - 08/04/16 0001      Shoulder Exercises: Pulleys   Flexion 3 minutes  109     Manual Therapy   Manual Therapy Soft tissue mobilization;Joint mobilization   Joint Mobilization GH joint  inferior/ posterior anterior grade 2 and 3 mobs with gentle osscillations, Grade 2 inferior lateral clavicle mobs    Soft tissue mobilization IASTM over the proximal / mid bicep and lateral/ anterior deltoid                  PT Short Term Goals - 07/26/16 1524      PT SHORT TERM GOAL #1   Title pt will be I with inital HEP (08/25/2016)   Time 4   Period Weeks   Status New     PT SHORT TERM GOAL #2   Title pt will be able to verbalize and demo techniques to prevent and reduce L shoulder pain and inflammation via RICE and HEP (08/25/2016)   Time 4   Period Weeks   Status New     PT SHORT TERM GOAL #3   Title he will increase L shoulder AROM  flexion/ abduction to >/= 90 degrees and ER to >/= 5 with </= 5/10 pain to promote functional mobility (08/25/2016)   Baseline ER was -20 degrees from neutral (assessed in neutral with elbow at side)   Time 4   Period Weeks   Status New     PT  SHORT TERM GOAL #4   Title he will increase his grip strength by >/= 6# in the L to demonstrate improvement in shoulder function (08/25/2016)   Time 4   Period Weeks   Status New           PT Long Term Goals - 07/26/16 1528      PT LONG TERM GOAL #1   Title He will be I with all HEP given as of last visit (09/20/2016)   Time 8   Period Weeks   Status New     PT LONG TERM GOAL #2   Title pt will increase L shoulder AROM flexion/ abduction to >/= 110 degrees and ER to >/= 30 degrees with </= 1/10 pain for functional mobility required for ADLs (09/20/2016)   Time 8   Period Weeks   Status New     PT LONG TERM GOAL #3   Title he will increase L shoulder strength to >/= 4-/5 in all  planes for functional lifting and carrying activities required for work related activities and ADLs (09/20/2016)   Time 8   Period Weeks   Status New     PT LONG TERM GOAL #4   Title pt will be able to lift and lower >/= 8# to and from an over head shelf with </= 1/10 pain for functional mobility, cooking/  cleaning, and 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 (09/20/2016)   Time 8   Period Weeks   Status New     PT LONG TERM GOAL #5   Title he will increase his FOTO score to </=36% limited to demonstrate improvement in function at discharge (09/20/2016)   Time 8   Period Weeks   Status New               Plan - 08/04/16 1109    Clinical Impression Statement Mr. Galdamez reports consistency with HEP. focused on shoulder mobility today with pulleys and shoulder/ clavicle mobs which he reported improved mobility and decreased pain/ tightness.    PT Next Visit Plan  PROM/ AAROM for L shoulder, shoulder mobs, manual PRN, scapular stabilizers,    Consulted and Agree with Plan of Care Patient      Patient will benefit from skilled therapeutic intervention in order to improve the following deficits and impairments:     Visit Diagnosis: Chronic left shoulder pain  Stiffness of left shoulder, not elsewhere classified  Muscle weakness (generalized)  Abnormal posture     Problem List Patient Active Problem List   Diagnosis Date Noted  . History of arthroscopy of left shoulder 07/12/2016  . Port catheter in place 02/22/2016  . 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 Vitamin D deficiency 11/21/2012  . Lymphoma ([place]) 06/13/2012   Starr Lake PT, DPT, LAT, ATC  08/04/16  11:11 AM      [facility] [address] [place], [place], [address] Phone: [phone]   Fax:  [phone]  Name: KELLIN TRIMBACH MRN: [id number] Date of Birth: October 27, 1940

## 2016-08-04 NOTE — Telephone Encounter (Signed)
Tried calling patient with no answer. 

## 2016-08-07 NOTE — Telephone Encounter (Signed)
Updated order faxed to [phone] to include hip and knee s/p fall.

## 2016-08-08 ENCOUNTER — Ambulatory Visit: Payer: Medicare HMO | Admitting: Physical Therapy

## 2016-08-08 DIAGNOSIS — M25512 Pain in left shoulder: Secondary | ICD-10-CM | POA: Diagnosis not present

## 2016-08-08 DIAGNOSIS — M25552 Pain in left hip: Secondary | ICD-10-CM

## 2016-08-08 DIAGNOSIS — M25612 Stiffness of left shoulder, not elsewhere classified: Secondary | ICD-10-CM

## 2016-08-08 DIAGNOSIS — R293 Abnormal posture: Secondary | ICD-10-CM

## 2016-08-08 DIAGNOSIS — G8929 Other chronic pain: Secondary | ICD-10-CM

## 2016-08-08 DIAGNOSIS — M6281 Muscle weakness (generalized): Secondary | ICD-10-CM

## 2016-08-08 DIAGNOSIS — M25562 Pain in left knee: Secondary | ICD-10-CM

## 2016-08-08 DIAGNOSIS — R2681 Unsteadiness on feet: Secondary | ICD-10-CM

## 2016-08-08 NOTE — Therapy (Signed)
[place], [place], [address] Phone: [phone]   Fax:  [phone]  Physical Therapy Evaluation  Patient Details  Name: [name] [name] MRN: [id number] Date of Birth: 11/23/1940 Referring Provider: Meridee Score MD  Encounter Date: 08/08/2016      PT End of Session - 08/08/16 1223    Visit Number 3   Number of Visits 17   Date for PT Re-Evaluation 09/20/16   Authorization Type Medicare: Kx by 15th visit, Progress note by 10th visit   PT Start Time 1104   PT Stop Time 1218   PT Time Calculation (min) 74 min   Activity Tolerance Patient tolerated treatment well   Behavior During Therapy [facility] for tasks assessed/performed      Past Medical History:  Diagnosis Date  . Femur fracture ([place])   . Hip fracture ([place])   . Hypertension   . Malignant neoplasm of pancreas, part unspecified   . Other malignant lymphomas, unspecified site, extranodal and solid organ sites     Past Surgical History:  Procedure Laterality Date  . FEMUR SURGERY Bilateral   . LEG SURGERY Left   . SHOULDER SURGERY Left     There were no vitals filed for this visit.       Subjective Assessment - 08/08/16 1107    Subjective pt present new script for Left hip/ knee secondary to a fall down a set of 12 steps on 07/12/2016. pt exhibit significant brusing on the lateral aspect of the femur from the hip to the knee; he reports which has gotten much better since onset. He did get imaging which ruled out fracture despite extensive surgical history of the L femur. Difficulty with going up steps intermittently with report feeling like it may buckle. reports after seing his MD that the Rod in femur may have bruised the patella. Regarding the shoulder pt reports last session really helped and decreased pain and tightness.    Currently in Pain? Yes   Pain Score 1   with movement 8/10   Pain Orientation Left   Multiple Pain Sites Yes   Pain  Score 1   Pain Location Leg   Pain Orientation Left;Anterior;Lateral   Pain Type Chronic pain   Pain Frequency Constant   Aggravating Factors  standing, stomping, weight bearing   Pain Relieving Factors N/A            [facility] PT Assessment - 08/08/16 0001      Assessment   Medical Diagnosis Left hip/ knee pain   Referring Provider Meridee Score MD   Onset Date/Surgical Date 07/12/16   Hand Dominance Right   Prior Therapy yes     Precautions   Precautions None     Restrictions   Weight Bearing Restrictions No     Posture/Postural Control   Posture/Postural Control Postural limitations   Postural Limitations Rounded Shoulders;Forward head     AROM   AROM Assessment Site Hip;Knee   Right/Left Hip Right;Left   Right Hip Extension 11   Right Hip Flexion 115   Right Hip ABduction 38   Left Hip Extension 10  pain rated as acute in the knee   Left Hip Flexion 103   Left Hip ABduction 35   Right/Left Knee Right;Left   Right Knee Extension 0   Right Knee Flexion 134   Left Knee Extension 0   Left Knee Flexion 126     PROM   PROM Assessment Site Hip;Knee  Right/Left Hip Left   Left Hip Flexion 110   Right/Left Knee Left   Left Knee Flexion 130     Strength   Strength Assessment Site Knee;Hip   Right/Left Hip Left;Right   Right Hip Flexion 4+/5   Right Hip ABduction 4-/5   Right Hip ADduction 4+/5   Left Hip Flexion 4+/5   Left Hip ABduction 4-/5   Left Hip ADduction 4+/5   Right/Left Knee Right;Left   Right Knee Flexion 5/5   Right Knee Extension 4+/5   Left Knee Flexion 5/5   Left Knee Extension 4+/5     Palpation   Palpation comment tightness with soreness noted only inthe mid/ distal rectus femoris with mulitple trigger points noted. mild tenderness in the glute medius/ and TFL                   [facility] Adult PT Treatment/Exercise - 08/08/16 0001      Modalities   Modalities Moist Heat     Moist Heat Therapy   Number Minutes Moist Heat 10  Minutes   Moist Heat Location Shoulder     Manual Therapy   Joint Mobilization GH joint inferior/ posterior anterior grade 2 and 3 mobs with gentle osscillations, Grade 2 inferior lateral clavicle mobs   increased stiffness today                PT Education - 08/08/16 1222    Education provided Yes   Education Details evaluation findings regarding the L hip and knee, updated HEP with proper form/ rationale, manual trigger point release techniques and benefits.    Person(s) Educated Patient   Methods Explanation;Verbal cues;Handout   Comprehension Verbalized understanding;Verbal cues required          PT Short Term Goals - 08/08/16 1228      PT SHORT TERM GOAL #1   Title pt will be I with inital HEP (08/25/2016)   Time 4   Period Weeks   Status On-going     PT SHORT TERM GOAL #2   Title pt will be able to verbalize and demo techniques to prevent and reduce L shoulder pain and inflammation via RICE and HEP (08/25/2016)   Time 4   Period Weeks   Status On-going     PT SHORT TERM GOAL #3   Title he will increase L shoulder AROM  flexion/ abduction to >/= 90 degrees and ER to >/= 5 with </= 5/10 pain to promote functional mobility (08/25/2016)   Time 4   Period Weeks   Status On-going     PT SHORT TERM GOAL #4   Title he will increase his grip strength by >/= 6# in the L to demonstrate improvement in shoulder function (08/25/2016)   Time 4   Period Weeks   Status On-going     PT SHORT TERM GOAL #5   Title He will demonstrate reduce pain and tightness in the quad/ lateral hip with standing/ walking </= 2/10 and report no incidence of buckling or giving way within the last 2 weeks (08/25/2016)   Time 4   Period Weeks   Status New           PT Long Term Goals - 08/08/16 1230      PT LONG TERM GOAL #1   Title He will be I with all HEP given as of last visit (09/20/2016)   Time 8   Period Weeks   Status New     PT  LONG TERM GOAL #2   Title pt will increase L  shoulder AROM flexion/ abduction to >/= 110 degrees and ER to >/= 30 degrees with </= 1/10 pain for functional mobility required for ADLs (09/20/2016)   Time 8   Period Weeks   Status New     PT LONG TERM GOAL #3   Title he will increase L shoulder strength to >/= 4-/5 in all  planes for functional lifting and carrying activities required for work related activities and ADLs (09/20/2016)   Time 8   Period Weeks   Status New     PT LONG TERM GOAL #4   Title pt will be able to lift and lower >/= 8# to and from an over head shelf with </= 1/10 pain for functional mobility, cooking/ cleaning, and 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 (09/20/2016)   Time 8   Period Weeks   Status New     PT LONG TERM GOAL #5   Title he will increase his FOTO score to </=36% limited to demonstrate improvement in function at discharge (09/20/2016)   Time 8   Period Weeks   Status New     Additional Long Term Goals   Additional Long Term Goals Yes     PT LONG TERM GOAL #6   Title He will increase L hip abductor/ extensor strength to >/= 4+/5 to promote walking/ standing endurance while promoting safety (09/20/2016)   Time 8   Period Weeks   Status New     PT LONG TERM GOAL #7   Title he will be able walk/ stand for >/= 45 minutes with </= 1/10 pain for functional endurance required for ADLs and pt's personal goals or returning walking 2 miles a few times a week (09/20/2016)   Time 8   Period Weeks   Status New     PT LONG TERM GOAL #8   Period Weeks   Status New               Plan - 08/08/16 1224    Clinical Impression Statement Mr. Twisdale presents with new referral to include L hip/ knee to his current POC. He demonstrates functional hip mobility bil with mild limitation on the L compared bil. Some weakness with L knee extensors and hip abductors/ extensors but no pain noted during testing. palpable tenderness with tightness in the distal rectus femoris, and tendernesss  in the proximal lateral hip. continued PROM/ mobs for the L shoulder per pt report of reduce pain and improved motion.    Rehab Potential Good   PT Frequency 2x / week   PT Duration 8 weeks   PT Treatment/Interventions ADLs/Self Care Home Management;Cryotherapy;Electrical Stimulation;Iontophoresis 4mg /ml Dexamethasone;Moist Heat;Ultrasound;Therapeutic activities;Therapeutic exercise;Dry needling;Taping;Manual techniques;Patient/family education;Neuromuscular re-education   PT Next Visit Plan  PROM/ AAROM for L shoulder, shoulder mobs, manual PRN, scapular stabilizers, LE strengthening, BERG balance assessment   PT Home Exercise Plan table slides flexion/ scaption angles, scapular retraction, wand ER, sit to to stand, LAQ, standing hip abduction/ extension.    Consulted and Agree with Plan of Care Patient      Patient will benefit from skilled therapeutic intervention in order to improve the following deficits and impairments:  Abnormal gait, Pain, Improper body mechanics, Postural dysfunction, Hypomobility, Decreased range of motion, Increased fascial restricitons, Decreased endurance, Decreased activity tolerance, Decreased strength  Visit Diagnosis: Chronic left shoulder pain - Plan: PT plan of care cert/re-cert  Stiffness of left shoulder,  not elsewhere classified - Plan: PT plan of care cert/re-cert  Muscle weakness (generalized) - Plan: PT plan of care cert/re-cert  Abnormal posture - Plan: PT plan of care cert/re-cert  Chronic pain of left knee - Plan: PT plan of care cert/re-cert  Pain in left hip - Plan: PT plan of care cert/re-cert  Unsteadiness on feet - Plan: PT plan of care cert/re-cert     Problem List Patient Active Problem List   Diagnosis Date Noted  . History of arthroscopy of left shoulder 07/12/2016  . Port catheter in place 02/22/2016  . 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 Vitamin D deficiency 11/21/2012  . Lymphoma  ([place]) 06/13/2012   Starr [place] PT, DPT, LAT, ATC  08/08/16  12:46 PM      [facility] [address] [place], [place], [address] Phone: [phone]   Fax:  [phone]  Name: [name] [name] MRN: [id number] Date of Birth: 09-30-1940

## 2016-08-15 ENCOUNTER — Ambulatory Visit: Payer: Medicare HMO | Admitting: Physical Therapy

## 2016-08-15 DIAGNOSIS — M6281 Muscle weakness (generalized): Secondary | ICD-10-CM

## 2016-08-15 DIAGNOSIS — R2681 Unsteadiness on feet: Secondary | ICD-10-CM

## 2016-08-15 DIAGNOSIS — R293 Abnormal posture: Secondary | ICD-10-CM

## 2016-08-15 DIAGNOSIS — M25552 Pain in left hip: Secondary | ICD-10-CM

## 2016-08-15 DIAGNOSIS — M25612 Stiffness of left shoulder, not elsewhere classified: Secondary | ICD-10-CM

## 2016-08-15 DIAGNOSIS — M25562 Pain in left knee: Secondary | ICD-10-CM

## 2016-08-15 DIAGNOSIS — G8929 Other chronic pain: Secondary | ICD-10-CM

## 2016-08-15 DIAGNOSIS — M25512 Pain in left shoulder: Principal | ICD-10-CM

## 2016-08-15 NOTE — Therapy (Signed)
[place], [place], [address] Phone: [phone]   Fax:  [phone]  Physical Therapy Treatment  Patient Details  Name: [name] [name] MRN: [id number] Date of Birth: [birth year] Referring Provider: Meridee Score MD  Encounter Date: 08/15/2016      PT End of Session - 08/15/16 1037    Visit Number 4   Number of Visits 17   Date for PT Re-Evaluation 09/20/16   Authorization Type Medicare: Kx by 15th visit, Progress note by 10th visit   PT Start Time 1032   PT Stop Time 1130   PT Time Calculation (min) 58 min      Past Medical History:  Diagnosis Date  . Femur fracture ([place])   . Hip fracture ([place])   . Hypertension   . Malignant neoplasm of pancreas, part unspecified   . Other malignant lymphomas, unspecified site, extranodal and solid organ sites     Past Surgical History:  Procedure Laterality Date  . FEMUR SURGERY Bilateral   . LEG SURGERY Left   . SHOULDER SURGERY Left     There were no vitals filed for this visit.                       [facility] Adult PT Treatment/Exercise - 08/15/16 0001      Standardized Balance Assessment   Standardized Balance Assessment Berg Balance Test     Berg Balance Test   Sit to Stand Able to stand without using hands and stabilize independently   Standing Unsupported Able to stand safely 2 minutes   Sitting with Back Unsupported but Feet Supported on Floor or Stool Able to sit safely and securely 2 minutes   Stand to Sit Sits safely with minimal use of hands   Transfers Able to transfer safely, minor use of hands   Standing Unsupported with Eyes Closed Able to stand 10 seconds safely   Standing Ubsupported with Feet Together Able to place feet together independently and stand 1 minute safely   From Standing, Reach Forward with Outstretched Arm Can reach confidently >25 cm (10")   From Standing Position, Pick up Object from Floor Able to pick  up shoe safely and easily   From Standing Position, Turn to Look Behind Over each Shoulder Looks behind from both sides and weight shifts well   Turn 360 Degrees Able to turn 360 degrees safely in 4 seconds or less   Standing Unsupported, Alternately Place Feet on Step/Stool Able to stand independently and safely and complete 8 steps in 20 seconds   Standing Unsupported, One Foot in Front Able to place foot tandem independently and hold 30 seconds   Standing on One Leg Able to lift leg independently and hold equal to or more than 3 seconds   Total Score 54     Knee/Hip Exercises: Stretches   Piriformis Stretch 3 reps;30 seconds   Other Knee/Hip Stretches knee to chest 1 rep x 60 seconds each     Knee/Hip Exercises: Aerobic   Nustep L4 LE only x 6 minutes      Knee/Hip Exercises: Seated   Sit to Sand 15 reps;without UE support  cues to narrow stance, proper weight shift      Shoulder Exercises: Stretch   External Rotation Stretch 3 reps;30 seconds  single arm at doorway      Modalities   Modalities Moist Heat     Moist Heat Therapy   Number Minutes Moist  Heat 15 Minutes   Moist Heat Location Shoulder                PT Education - 08/15/16 1120    Education provided Yes   Education Details HEP   Person(s) Educated Patient   Methods Explanation;Handout   Comprehension Verbalized understanding          PT Short Term Goals - 08/08/16 1228      PT SHORT TERM GOAL #1   Title pt will be I with inital HEP (08/25/2016)   Time 4   Period Weeks   Status On-going     PT SHORT TERM GOAL #2   Title pt will be able to verbalize and demo techniques to prevent and reduce L shoulder pain and inflammation via RICE and HEP (08/25/2016)   Time 4   Period Weeks   Status On-going     PT SHORT TERM GOAL #3   Title he will increase L shoulder AROM  flexion/ abduction to >/= 90 degrees and ER to >/= 5 with </= 5/10 pain to promote functional mobility (08/25/2016)   Time 4    Period Weeks   Status On-going     PT SHORT TERM GOAL #4   Title he will increase his grip strength by >/= 6# in the L to demonstrate improvement in shoulder function (08/25/2016)   Time 4   Period Weeks   Status On-going     PT SHORT TERM GOAL #5   Title He will demonstrate reduce pain and tightness in the quad/ lateral hip with standing/ walking </= 2/10 and report no incidence of buckling or giving way within the last 2 weeks (08/25/2016)   Time 4   Period Weeks   Status New           PT Long Term Goals - 08/08/16 1230      PT LONG TERM GOAL #1   Title He will be I with all HEP given as of last visit (09/20/2016)   Time 8   Period Weeks   Status New     PT LONG TERM GOAL #2   Title pt will increase L shoulder AROM flexion/ abduction to >/= 110 degrees and ER to >/= 30 degrees with </= 1/10 pain for functional mobility required for ADLs (09/20/2016)   Time 8   Period Weeks   Status New     PT LONG TERM GOAL #3   Title he will increase L shoulder strength to >/= 4-/5 in all  planes for functional lifting and carrying activities required for work related activities and ADLs (09/20/2016)   Time 8   Period Weeks   Status New     PT LONG TERM GOAL #4   Title pt will be able to lift and lower >/= 8# to and from an over head shelf with </= 1/10 pain for functional mobility, cooking/ cleaning, and 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 (09/20/2016)   Time 8   Period Weeks   Status New     PT LONG TERM GOAL #5   Title he will increase his FOTO score to </=36% limited to demonstrate improvement in function at discharge (09/20/2016)   Time 8   Period Weeks   Status New     Additional Long Term Goals   Additional Long Term Goals Yes     PT LONG TERM GOAL #6   Title He will increase L hip abductor/ extensor strength to >/=  4+/5 to promote walking/ standing endurance while promoting safety (09/20/2016)   Time 8   Period Weeks   Status New     PT LONG  TERM GOAL #7   Title he will be able walk/ stand for >/= 45 minutes with </= 1/10 pain for functional endurance required for ADLs and pt's personal goals or returning walking 2 miles a few times a week (09/20/2016)   Time 8   Period Weeks   Status New     PT LONG TERM GOAL #8   Period Weeks   Status New               Plan - 08/15/16 1136    Clinical Impression Statement Pt reports new LE exercises were challenging in a good way. BERG 54/56 with only difficulty standing on one leg. He takes very small step while making 360 degree turns and may have some hip tightness attributing to this. PROM of hips revelas decreased hip IR bilateral. Added piriformis stretch to HEP. He is currently working only on ER.  We reviewed his LE HEP. He demonstrates ability to sit-stand without UE support. Added shoulder ER stretch with single arm in doorway with good tolerance. Cues for gentle stretch.    PT Next Visit Plan  PROM/ AAROM for L shoulder, shoulder mobs, manual PRN, scapular stabilizers, LE strengthening, BERG balance assessment   PT Home Exercise Plan table slides flexion/ scaption angles, scapular retraction, wand ER, sit to to stand, LAQ, standing hip abduction/ extension. Shoulder ER stretch at doorway, piriformis stretch   Consulted and Agree with Plan of Care Patient      Patient will benefit from skilled therapeutic intervention in order to improve the following deficits and impairments:  Abnormal gait, Pain, Improper body mechanics, Postural dysfunction, Hypomobility, Decreased range of motion, Increased fascial restricitons, Decreased endurance, Decreased activity tolerance, Decreased strength  Visit Diagnosis: Chronic left shoulder pain  Stiffness of left shoulder, not elsewhere classified  Muscle weakness (generalized)  Abnormal posture  Chronic pain of left knee  Pain in left hip  Unsteadiness on feet     Problem List Patient Active Problem List   Diagnosis Date Noted   . History of arthroscopy of left shoulder 07/12/2016  . Port catheter in place 02/22/2016  . 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 Vitamin D deficiency 11/21/2012  . Lymphoma ([place]) 06/13/2012    Dorene Ar, PTA 08/15/2016, 11:49 AM  [place], [place], [address] Phone: [phone]   Fax:  [phone]  Name: [name] [name] MRN: [id number] Date of Birth: Nov 07, 1940

## 2016-08-16 ENCOUNTER — Telehealth: Payer: Self-pay | Admitting: Internal Medicine

## 2016-08-16 NOTE — Telephone Encounter (Signed)
Appointment rescheduled per patient request. Patient missed 11/21 flush appointment.

## 2016-08-21 ENCOUNTER — Ambulatory Visit (HOSPITAL_BASED_OUTPATIENT_CLINIC_OR_DEPARTMENT_OTHER): Payer: Medicare HMO

## 2016-08-21 VITALS — BP 124/71 | HR 103 | Temp 98.2°F | Resp 18

## 2016-08-21 DIAGNOSIS — Z452 Encounter for adjustment and management of vascular access device: Secondary | ICD-10-CM | POA: Diagnosis not present

## 2016-08-21 DIAGNOSIS — C8251 Diffuse follicle center lymphoma, lymph nodes of head, face, and neck: Secondary | ICD-10-CM

## 2016-08-21 DIAGNOSIS — Z95828 Presence of other vascular implants and grafts: Secondary | ICD-10-CM

## 2016-08-21 DIAGNOSIS — C8330 Diffuse large B-cell lymphoma, unspecified site: Secondary | ICD-10-CM | POA: Diagnosis not present

## 2016-08-21 MED ORDER — SODIUM CHLORIDE 0.9 % IJ SOLN
10.0000 mL | INTRAMUSCULAR | Status: DC | PRN
  Administered 2016-08-21: 10 mL via INTRAVENOUS
  Filled 2016-08-21: qty 10

## 2016-08-21 MED ORDER — HEPARIN SOD (PORK) LOCK FLUSH 100 UNIT/ML IV SOLN
500.0000 [IU] | Freq: Once | INTRAVENOUS | Status: AC | PRN
  Administered 2016-08-21: 500 [IU] via INTRAVENOUS
  Filled 2016-08-21: qty 5

## 2016-08-22 ENCOUNTER — Ambulatory Visit: Payer: Medicare HMO | Attending: Family | Admitting: Physical Therapy

## 2016-08-22 DIAGNOSIS — R293 Abnormal posture: Secondary | ICD-10-CM

## 2016-08-22 DIAGNOSIS — M6281 Muscle weakness (generalized): Secondary | ICD-10-CM | POA: Diagnosis not present

## 2016-08-22 DIAGNOSIS — R2681 Unsteadiness on feet: Secondary | ICD-10-CM | POA: Diagnosis not present

## 2016-08-22 DIAGNOSIS — M25552 Pain in left hip: Secondary | ICD-10-CM | POA: Diagnosis not present

## 2016-08-22 DIAGNOSIS — G8929 Other chronic pain: Secondary | ICD-10-CM | POA: Diagnosis not present

## 2016-08-22 DIAGNOSIS — M25612 Stiffness of left shoulder, not elsewhere classified: Secondary | ICD-10-CM | POA: Diagnosis not present

## 2016-08-22 DIAGNOSIS — M25512 Pain in left shoulder: Secondary | ICD-10-CM | POA: Diagnosis not present

## 2016-08-22 DIAGNOSIS — M25562 Pain in left knee: Secondary | ICD-10-CM | POA: Insufficient documentation

## 2016-08-22 NOTE — Therapy (Signed)
[place], [place], [address] Phone: [phone]   Fax:  [phone]  Physical Therapy Treatment  Patient Details  Name: [name] [name] MRN: [id number] Date of Birth: 09-28-1940 Referring Provider: Meridee Score MD  Encounter Date: 08/22/2016      PT End of Session - 08/22/16 1253    Visit Number 5   Number of Visits 17   Date for PT Re-Evaluation 09/20/16   Authorization Type Medicare: Kx by 15th visit, Progress note by 10th visit   PT Start Time 1100   PT Stop Time 1148   PT Time Calculation (min) 48 min   Activity Tolerance Patient tolerated treatment well   Behavior During Therapy [facility] for tasks assessed/performed      Past Medical History:  Diagnosis Date  . Femur fracture ([place])   . Hip fracture ([place])   . Hypertension   . Malignant neoplasm of pancreas, part unspecified   . Other malignant lymphomas, unspecified site, extranodal and solid organ sites     Past Surgical History:  Procedure Laterality Date  . FEMUR SURGERY Bilateral   . LEG SURGERY Left   . SHOULDER SURGERY Left     There were no vitals filed for this visit.      Subjective Assessment - 08/22/16 1105    Subjective " I stll have a fair amount of pain, I can lay on the L side to get the R shoulder relief"   Currently in Pain? Yes   Pain Score 1   lifting the arm 6/10   Pain Location Shoulder   Pain Orientation Left   Multiple Pain Sites No   Pain Score 0   Pain Orientation Left                         [facility] Adult PT Treatment/Exercise - 08/22/16 0001      Knee/Hip Exercises: Aerobic   Nustep L5 LE / UE x 6 minutes      Shoulder Exercises: Seated   Other Seated Exercises self mobilization inferior glide 2  x 10 holding 5 seci     Moist Heat Therapy   Number Minutes Moist Heat 10 Minutes   Moist Heat Location Shoulder     Manual Therapy   Manual therapy comments manual trigger point release  over the anterior/ middle deltoid   Joint Mobilization GH joint inferior/ posterior anterior grade 2 and 3 mobs with gentle osscillations, Grade 2 inferior lateral clavicle mobs    Soft tissue mobilization IASTM over the proximal / mid bicep and lateral/ anterior deltoid                  PT Short Term Goals - 08/08/16 1228      PT SHORT TERM GOAL #1   Title pt will be I with inital HEP (08/25/2016)   Time 4   Period Weeks   Status On-going     PT SHORT TERM GOAL #2   Title pt will be able to verbalize and demo techniques to prevent and reduce L shoulder pain and inflammation via RICE and HEP (08/25/2016)   Time 4   Period Weeks   Status On-going     PT SHORT TERM GOAL #3   Title he will increase L shoulder AROM  flexion/ abduction to >/= 90 degrees and ER to >/= 5 with </= 5/10 pain to promote functional mobility (08/25/2016)   Time 4  Period Weeks   Status On-going     PT SHORT TERM GOAL #4   Title he will increase his grip strength by >/= 6# in the L to demonstrate improvement in shoulder function (08/25/2016)   Time 4   Period Weeks   Status On-going     PT SHORT TERM GOAL #5   Title He will demonstrate reduce pain and tightness in the quad/ lateral hip with standing/ walking </= 2/10 and report no incidence of buckling or giving way within the last 2 weeks (08/25/2016)   Time 4   Period Weeks   Status New           PT Long Term Goals - 08/08/16 1230      PT LONG TERM GOAL #1   Title He will be I with all HEP given as of last visit (09/20/2016)   Time 8   Period Weeks   Status New     PT LONG TERM GOAL #2   Title pt will increase L shoulder AROM flexion/ abduction to >/= 110 degrees and ER to >/= 30 degrees with </= 1/10 pain for functional mobility required for ADLs (09/20/2016)   Time 8   Period Weeks   Status New     PT LONG TERM GOAL #3   Title he will increase L shoulder strength to >/= 4-/5 in all  planes for functional lifting and carrying  activities required for work related activities and ADLs (09/20/2016)   Time 8   Period Weeks   Status New     PT LONG TERM GOAL #4   Title pt will be able to lift and lower >/= 8# to and from an over head shelf with </= 1/10 pain for functional mobility, cooking/ cleaning, and 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 (09/20/2016)   Time 8   Period Weeks   Status New     PT LONG TERM GOAL #5   Title he will increase his FOTO score to </=36% limited to demonstrate improvement in function at discharge (09/20/2016)   Time 8   Period Weeks   Status New     Additional Long Term Goals   Additional Long Term Goals Yes     PT LONG TERM GOAL #6   Title He will increase L hip abductor/ extensor strength to >/= 4+/5 to promote walking/ standing endurance while promoting safety (09/20/2016)   Time 8   Period Weeks   Status New     PT LONG TERM GOAL #7   Title he will be able walk/ stand for >/= 45 minutes with </= 1/10 pain for functional endurance required for ADLs and pt's personal goals or returning walking 2 miles a few times a week (09/20/2016)   Time 8   Period Weeks   Status New     PT LONG TERM GOAL #8   Period Weeks   Status New               Plan - 08/22/16 1253    Clinical Impression Statement pt reports he is progressing with LE strength and has no report of buckling or incidence of giving way. he continue to hve tightness in the L shoulder primarly in the anterior/ lateral deltoid with flexion and abduction. perofrmed mobilizations on the [facility] joint and clavicle to calm down pain and inflammation.. continued MHP post session to calm down muscle sorenes.    PT Next Visit Plan DN, US/ other modalities,  PROM/ AAROM for L shoulder, shoulder mobs, manual PRN, scapular stabilizers, LE strengthening,   PT Home Exercise Plan table slides flexion/ scaption angles, scapular retraction, wand ER, sit to to stand, LAQ, standing hip abduction/ extension. Shoulder ER  stretch at doorway, piriformis stretch   Consulted and Agree with Plan of Care Patient      Patient will benefit from skilled therapeutic intervention in order to improve the following deficits and impairments:     Visit Diagnosis: Chronic left shoulder pain  Stiffness of left shoulder, not elsewhere classified  Muscle weakness (generalized)  Abnormal posture  Chronic pain of left knee  Pain in left hip  Unsteadiness on feet     Problem List Patient Active Problem List   Diagnosis Date Noted  . History of arthroscopy of left shoulder 07/12/2016  . Port catheter in place 02/22/2016  . 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 Vitamin D deficiency 11/21/2012  . Lymphoma ([place]) 06/13/2012   Starr Lake PT, DPT, LAT, ATC  08/22/16  12:58 PM      [facility] [address] [place], [place], [address] Phone: [phone]   Fax:  [phone]  Name: [name] [name] MRN: [id number] Date of Birth: 1941-01-19

## 2016-08-23 ENCOUNTER — Ambulatory Visit (INDEPENDENT_AMBULATORY_CARE_PROVIDER_SITE_OTHER): Payer: Medicare HMO | Admitting: Family

## 2016-08-23 VITALS — Ht 69.0 in | Wt 182.0 lb

## 2016-08-23 DIAGNOSIS — Z9889 Other specified postprocedural states: Secondary | ICD-10-CM

## 2016-08-23 NOTE — Progress Notes (Signed)
Office Visit Note   Patient: [name] [name]           Date of Birth: [birth year]           MRN: [id number] Visit Date: 08/23/2016              Requested by: [name], MD [address] [place], [place] [address] PCP: [name], MD   Assessment & Plan: Visit Diagnoses:  1. History of arthroscopy of left shoulder     Plan: He will work aggressively on home exercise program for the left shoulder. Complete physical therapy.  Follow-Up Instructions: Return in about 4 weeks (around 09/20/2016).   Orders:  No orders of the defined types were placed in this encounter.  No orders of the defined types were placed in this encounter.     Procedures: No procedures performed   Clinical Data: No additional findings.   Subjective: Chief Complaint  Patient presents with  . Left Shoulder - Routine Post Op    10/0/17 left shoulder scope and debridement     Patient is a 75 year old gentleman who is over 2 months status post left shoulder arthroscopy for rotator cuff and frozen shoulder. He is going to physical therapy once a week. He does complain of discomfort with range of motion but he does not take anything for this. He can walk arm completely of the wall does have great discomfort with this. Pain with sleep in lies on left side. Has been using heat for discomfort.     Review of Systems  Constitutional: Negative for chills and fever.     Objective: Vital Signs: Ht 5\' 9"  (1.753 m)   Wt 182 lb (82.6 kg)   BMI 26.88 kg/m   Physical Exam  Constitutional: He is oriented to person, place, and time. He appears well-developed and well-nourished.  Pulmonary/Chest: Effort normal.  Neurological: He is alert and oriented to person, place, and time.  Psychiatric: He has a normal mood and affect.  Nursing note reviewed.   Left Shoulder Exam  Left shoulder exam is normal.  Tenderness  The patient is experiencing tenderness in the biceps  tendon.  Range of Motion  Active Abduction: 90   Tests  Impingement: negative  Other  Erythema: absent Scars: present Pulse: present       Specialty Comments:  No specialty comments available.  Imaging: No results found.   PMFS History: Patient Active Problem List   Diagnosis Date Noted  . History of arthroscopy of left shoulder 07/12/2016  . Port catheter in place 02/22/2016  . 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 Vitamin D deficiency 11/21/2012  . Lymphoma ([place]) 06/13/2012   Past Medical History:  Diagnosis Date  . Femur fracture ([place])   . Hip fracture ([place])   . Hypertension   . Malignant neoplasm of pancreas, part unspecified   . Other malignant lymphomas, unspecified site, extranodal and solid organ sites     No family history on file.  Past Surgical History:  Procedure Laterality Date  . FEMUR SURGERY Bilateral   . LEG SURGERY Left   . SHOULDER SURGERY Left    Social History   Occupational History  . Not on file.   Social History Main Topics  . Smoking status: Never Smoker  . Smokeless tobacco: Not on file  . Alcohol use No  . Drug use: No  . Sexual activity: Not on file

## 2016-08-24 ENCOUNTER — Ambulatory Visit: Payer: Medicare HMO | Admitting: Physical Therapy

## 2016-09-05 ENCOUNTER — Telehealth: Payer: Self-pay | Admitting: Internal Medicine

## 2016-09-05 NOTE — Telephone Encounter (Signed)
Mailed records to [facility]/ risk adjustment °

## 2016-09-20 ENCOUNTER — Ambulatory Visit (INDEPENDENT_AMBULATORY_CARE_PROVIDER_SITE_OTHER): Payer: Medicare HMO | Admitting: Family

## 2016-09-20 VITALS — Ht 69.0 in | Wt 182.0 lb

## 2016-09-20 DIAGNOSIS — M7502 Adhesive capsulitis of left shoulder: Secondary | ICD-10-CM

## 2016-09-20 MED ORDER — LIDOCAINE HCL 1 % IJ SOLN
5.0000 mL | INTRAMUSCULAR | Status: AC | PRN
  Administered 2016-09-20: 5 mL

## 2016-09-20 MED ORDER — METHYLPREDNISOLONE ACETATE 40 MG/ML IJ SUSP
40.0000 mg | INTRAMUSCULAR | Status: AC | PRN
  Administered 2016-09-20: 40 mg via INTRA_ARTICULAR

## 2016-09-20 NOTE — Progress Notes (Signed)
Office Visit Note   Patient: [name] [name]           Date of Birth: 10/21/1940           MRN: [id number] Visit Date: 09/20/2016              Requested by: [name], MD [place], [place] [address] PCP: [name], MD   Assessment & Plan: Visit Diagnoses:  1. Adhesive capsulitis of left shoulder     Plan: Steroid injection today. Provided orders for continued physical therapy. Encouraged him to work aggressively on this. Physical therapy will provide a home pulley system for home therapy.  Follow-Up Instructions: No Follow-up on file.   Orders:  Orders Placed This Encounter  Procedures  . Large Joint Injection/Arthrocentesis  . Ambulatory referral to Physical Therapy   No orders of the defined types were placed in this encounter.     Procedures: Large Joint Inj Date/Time: 09/20/2016 11:14 AM Performed by: Suzan Slick Authorized by: Dondra Prader R   Consent Given by:  Patient Site marked: the procedure site was marked   Timeout: prior to procedure the correct patient, procedure, and site was verified   Indications:  Pain and diagnostic evaluation Location:  Shoulder Site:  L subacromial bursa Prep: patient was prepped and draped in usual sterile fashion   Needle Size:  22 G Needle Length:  1.5 inches Ultrasound Guidance: No   Fluoroscopic Guidance: No   Arthrogram: No   Medications:  5 mL lidocaine 1 %; 40 mg methylPREDNISolone acetate 40 MG/ML Aspiration Attempted: No   Patient tolerance:  Patient tolerated the procedure well with no immediate complications     Clinical Data: No additional findings.   Subjective: Chief Complaint  Patient presents with  . Left Shoulder - Follow-up    06/27/16 left shoulder scope and debridement       Patient is a 76 year old gentleman who is seen today in follow-up status post shoulder scope and debridement on the left on 06/27/2016 for adhesive capsulitis on the left.  states that he has not had physical therapy for the past three weeks. He did however complete 6 weeks of physical therapy prior to that. He states that he is not "moving along as fast as I would like." Continues to have poor range of motion, abduction with pain at end of range of motion.    Review of Systems  Constitutional: Negative for chills and fever.  Musculoskeletal: Positive for arthralgias.     Objective: Vital Signs: Ht 5\' 9"  (1.753 m)   Wt 182 lb (82.6 kg)   BMI 26.88 kg/m   Physical Exam  Constitutional: He is oriented to person, place, and time. He appears well-developed and well-nourished.  Pulmonary/Chest: Effort normal.  Neurological: He is alert and oriented to person, place, and time.  Psychiatric: He has a normal mood and affect.  Nursing note reviewed.   Left Shoulder Exam   Tenderness  The patient is experiencing tenderness in the biceps tendon.  Range of Motion  Active Abduction: 70  Passive Abduction: 80   Other  Scars: present   Comments:  Sutures harvested. There are no open areas no redness no sign of infection      Specialty Comments:  No specialty comments available.  Imaging: No results found.   PMFS History: Patient Active Problem List   Diagnosis Date Noted  . 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 History of arthroscopy of  left shoulder 07/12/2016  . Port catheter in place 02/22/2016  . 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 Vitamin D deficiency 11/21/2012  . Lymphoma ([place]) 06/13/2012   Past Medical History:  Diagnosis Date  . Femur fracture ([place])   . Hip fracture ([place])   . Hypertension   . Malignant neoplasm of pancreas, part unspecified   . Other malignant lymphomas, unspecified site, extranodal and solid organ sites     No family history on file.  Past Surgical History:  Procedure Laterality Date  . FEMUR SURGERY Bilateral   . LEG SURGERY Left   . SHOULDER SURGERY  Left    Social History   Occupational History  . Not on file.   Social History Main Topics  . Smoking status: Never Smoker  . Smokeless tobacco: Not on file  . Alcohol use No  . Drug use: No  . Sexual activity: Not on file

## 2016-09-29 ENCOUNTER — Telehealth: Payer: Self-pay | Admitting: *Deleted

## 2016-09-29 DIAGNOSIS — H43813 Vitreous degeneration, bilateral: Secondary | ICD-10-CM | POA: Diagnosis not present

## 2016-09-29 DIAGNOSIS — D2311 Other benign neoplasm of skin of right eyelid, including canthus: Secondary | ICD-10-CM | POA: Diagnosis not present

## 2016-09-29 DIAGNOSIS — H34813 Central retinal vein occlusion, bilateral, with macular edema: Secondary | ICD-10-CM | POA: Diagnosis not present

## 2016-09-29 DIAGNOSIS — H43391 Other vitreous opacities, right eye: Secondary | ICD-10-CM | POA: Diagnosis not present

## 2016-09-29 NOTE — Telephone Encounter (Signed)
Per patient request I have moved appt his Tuesday appt to after 9am.   Patient aware

## 2016-10-03 ENCOUNTER — Ambulatory Visit (HOSPITAL_BASED_OUTPATIENT_CLINIC_OR_DEPARTMENT_OTHER): Payer: Medicare HMO

## 2016-10-03 VITALS — BP 152/92 | HR 89 | Temp 97.8°F | Resp 18

## 2016-10-03 DIAGNOSIS — C8330 Diffuse large B-cell lymphoma, unspecified site: Secondary | ICD-10-CM | POA: Diagnosis not present

## 2016-10-03 DIAGNOSIS — Z95828 Presence of other vascular implants and grafts: Secondary | ICD-10-CM

## 2016-10-03 DIAGNOSIS — Z452 Encounter for adjustment and management of vascular access device: Secondary | ICD-10-CM | POA: Diagnosis not present

## 2016-10-03 DIAGNOSIS — C8251 Diffuse follicle center lymphoma, lymph nodes of head, face, and neck: Secondary | ICD-10-CM

## 2016-10-03 MED ORDER — HEPARIN SOD (PORK) LOCK FLUSH 100 UNIT/ML IV SOLN
500.0000 [IU] | Freq: Once | INTRAVENOUS | Status: AC | PRN
  Administered 2016-10-03: 500 [IU] via INTRAVENOUS
  Filled 2016-10-03: qty 5

## 2016-10-03 MED ORDER — SODIUM CHLORIDE 0.9 % IJ SOLN
10.0000 mL | INTRAMUSCULAR | Status: DC | PRN
  Administered 2016-10-03: 10 mL via INTRAVENOUS
  Filled 2016-10-03: qty 10

## 2016-10-04 ENCOUNTER — Ambulatory Visit: Payer: Medicare HMO | Admitting: Physical Therapy

## 2016-10-09 ENCOUNTER — Ambulatory Visit: Payer: Medicare HMO | Attending: Family | Admitting: Physical Therapy

## 2016-10-09 ENCOUNTER — Encounter: Payer: Self-pay | Admitting: Physical Therapy

## 2016-10-09 DIAGNOSIS — G8929 Other chronic pain: Secondary | ICD-10-CM

## 2016-10-09 DIAGNOSIS — M6281 Muscle weakness (generalized): Secondary | ICD-10-CM

## 2016-10-09 DIAGNOSIS — M25512 Pain in left shoulder: Secondary | ICD-10-CM | POA: Diagnosis present

## 2016-10-09 DIAGNOSIS — M25612 Stiffness of left shoulder, not elsewhere classified: Secondary | ICD-10-CM

## 2016-10-09 NOTE — Therapy (Signed)
[place] [address] [place], [place], [address] Phone: [phone]   Fax:  [phone]  Physical Therapy Evaluation  Patient Details  Name: [name] [name] MRN: [id number] Date of Birth: March 19, 1941 Referring Provider: Meridee Score MD  Encounter Date: 10/09/2016      PT End of Session - 10/09/16 1445    Visit Number 1   Date for PT Re-Evaluation 12/07/16   PT Start Time 1416   PT Stop Time 1446   PT Time Calculation (min) 30 min   Activity Tolerance Patient tolerated treatment well   Behavior During Therapy [facility] for tasks assessed/performed      Past Medical History:  Diagnosis Date  . Femur fracture ([place])   . Hip fracture ([place])   . Hypertension   . Malignant neoplasm of pancreas, part unspecified   . Other malignant lymphomas, unspecified site, extranodal and solid organ sites     Past Surgical History:  Procedure Laterality Date  . FEMUR SURGERY Bilateral   . LEG SURGERY Left   . SHOULDER SURGERY Left     There were no vitals filed for this visit.       Subjective Assessment - 10/09/16 1418    Subjective Patient reports that he has a left frozen shoulder and some leg weakness.  He was seen by our office at [facility], he had a left shoulder scope in October to debride the joint and a manipulation.  He reports he made some progress but overall he reports continued pain and decreased ROM, he also has some difficulty with LE strength.  He does report a left RC tear.  He reports that this occurred about 15 years ago.  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 Yes   Pain Score 0-No pain   Pain Location Shoulder   Pain Orientation Left   Pain  Descriptors / Indicators Sore;Tightness   Pain Type Surgical pain   Pain Onset More than a month ago   Pain Frequency Intermittent   Aggravating Factors  use of the left UE pain can be 5/10   Pain Relieving Factors rest   Effect of Pain on Daily Activities just hard to do things iwth the left arm            [facility] PT Assessment - 10/09/16 0001      Assessment   Medical Diagnosis left shoulder adhesive capsulitis, leg weakness   Onset Date/Surgical Date 06/27/16   Hand Dominance Right   Prior Therapy yes     Precautions   Precautions None     Balance Screen   Has the patient fallen in the past 6 months Yes   How many times? 1   Has the patient had a decrease in activity level because of a fear of falling?  No   Is the patient reluctant to leave their home because of a fear of falling?  No     Home Environment   Additional Comments does housework and yardwork, has stairs at home     Prior Function   Level of Independence Independent;Independent with basic ADLs   Vocation Part time employment   Vocation Requirements out of his home   Leisure no  exercise     Posture/Postural Control   Postural Limitations Rounded Shoulders;Forward head     AROM   Left Shoulder Flexion 80 Degrees   Left Shoulder ABduction 75 Degrees   Left Shoulder Internal Rotation 30 Degrees   Left Shoulder External Rotation -15 Degrees     PROM   Left Shoulder Flexion 100 Degrees   Left Shoulder ABduction 90 Degrees   Left Shoulder External Rotation 0 Degrees     Strength   Overall Strength Comments hips 4+/5, knees 4/5, left shoulder minimal strength due to poor ROM     Palpation   Palpation comment tightness with soreness noted only inthe mid/ distal rectus femoris with mulitple trigger points noted. mild tenderness in the glute medius/ and TFL     Transfers   Comments reports difficulty getting up from seated postion without using arms due to knee weakness     Ambulation/Gait   Gait  Comments slow gait, kind of shuffles feet with toes out                           PT Education - 10/09/16 1445    Education provided Yes   Education Details corner stretch and table stretch with focus on ER   Person(s) Educated Patient   Methods Explanation;Demonstration;Handout   Comprehension Verbalized understanding;Returned demonstration;Verbal cues required;Tactile cues required          PT Short Term Goals - 10/09/16 1450      PT SHORT TERM GOAL #1   Title pt will be I with inital HEP )   Time 4   Period Weeks   Status New           PT Long Term Goals - 10/09/16 1450      PT LONG TERM GOAL #1   Title He will be I with all HEP given as of last visit    Time 8   Period Weeks   Status New     PT LONG TERM GOAL #2   Title pt will increase L shoulder AROM flexion/ abduction to >/= 110 degrees and ER to >/= 30 degrees with </= 1/10 pain for functional mobility required for ADLs (09/20/2016)   Time 8   Period Weeks   Status New     PT LONG TERM GOAL #3   Title he will increase L shoulder strength to >/= 4-/5 in all  planes for functional lifting and carrying activities required for work related activities and ADLs (09/20/2016)   Time 8   Period Weeks   Status New     PT LONG TERM GOAL #4   Title pt will be able to lift and lower >/= 8# to and from an over head shelf with </= 1/10 pain for functional mobility, cooking/ cleaning, and 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 (09/20/2016)   Time 8   Period Weeks   Status New     PT LONG TERM GOAL #5   Title he will increase his FOTO score to </=36% limited to demonstrate improvement in function at discharge)   Time 8   Period Weeks   Status New     PT LONG TERM GOAL #6   Title He will increase L hip abductor/ extensor strength to >/= 4+/5 to promote walking/ standing endurance while promoting safety    Time 8   Period Weeks   Status New     PT  LONG TERM GOAL #7    Title he will be able walk/ stand for >/= 45 minutes with </= 1/10 pain for functional endurance required for ADLs and pt's personal goals or returning walking 2 miles a few times a week    Time 8   Period Weeks   Status New               Plan - 10/09/16 1446    Clinical Impression Statement Patient reports an accident about 15 years ago that caused damage to his LE's and his left shoulder, he reports overall recovering weel until about the last year, he started noticing pain in the left shoulder all of the time and he had decreased ROM, he talks about difficulty with ADL's with the left arm, and some increased difficulty walking, he had surgery in October to debride the shoulder and manipulate the shoulder, he had a RC tear that was unrepairable.  He reports a fall about two weeks after the surgery.  He had PT and reports some increase in ROM but not much, due to holidays he stopped going and his last visit was 08/22/16.  He returns iwth very poor ROM of the shoulder especially ER and reports of difficulty getting up from sitting   Rehab Potential Good   PT Frequency 2x / week   PT Duration 8 weeks   PT Treatment/Interventions ADLs/Self Care Home Management;Cryotherapy;Electrical Stimulation;Iontophoresis 4mg /ml Dexamethasone;Moist Heat;Ultrasound;Therapeutic activities;Therapeutic exercise;Dry needling;Taping;Manual techniques;Patient/family education;Neuromuscular re-education   PT Next Visit Plan work on ROM of the shoulder, function and strength of the LE's   Consulted and Agree with Plan of Care Patient      Patient will benefit from skilled therapeutic intervention in order to improve the following deficits and impairments:  Abnormal gait, Pain, Improper body mechanics, Postural dysfunction, Hypomobility, Decreased range of motion, Increased fascial restricitons, Decreased endurance, Decreased activity tolerance, Decreased strength  Visit Diagnosis: Chronic left shoulder pain -  Plan: PT plan of care cert/re-cert  Stiffness of left shoulder, not elsewhere classified - Plan: PT plan of care cert/re-cert  Muscle weakness (generalized) - Plan: PT plan of care cert/re-cert      G-Codes - [id number]    Functional Assessment Tool Used FOTO 66% limitation   Functional Limitation Carrying, moving and handling objects   Carrying, Moving and Handling Objects Current Status [id number]) At least 60 percent but less than 80 percent impaired, limited or restricted   Carrying, Moving and Handling Objects Goal Status [id number]) At least 20 percent but less than 40 percent impaired, limited or restricted       Problem List Patient Active Problem List   Diagnosis Date Noted  . 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 History of arthroscopy of left shoulder 07/12/2016  . Port catheter in place 02/22/2016  . 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 Vitamin D deficiency 11/21/2012  . Lymphoma ([place]) 06/13/2012    Sumner Boast., PT 10/09/2016, 3:21 PM  [address] [place], [place], [address] Phone: [phone]   Fax:  [phone]  Name: [name] [name] MRN: [id number] Date of Birth: 02/03/1941

## 2016-10-10 DIAGNOSIS — L739 Follicular disorder, unspecified: Secondary | ICD-10-CM | POA: Diagnosis not present

## 2016-10-10 DIAGNOSIS — D485 Neoplasm of uncertain behavior of skin: Secondary | ICD-10-CM | POA: Diagnosis not present

## 2016-10-10 DIAGNOSIS — L929 Granulomatous disorder of the skin and subcutaneous tissue, unspecified: Secondary | ICD-10-CM | POA: Diagnosis not present

## 2016-10-12 ENCOUNTER — Encounter: Payer: Self-pay | Admitting: Physical Therapy

## 2016-10-12 ENCOUNTER — Ambulatory Visit: Payer: Medicare HMO | Admitting: Physical Therapy

## 2016-10-12 DIAGNOSIS — M25512 Pain in left shoulder: Principal | ICD-10-CM

## 2016-10-12 DIAGNOSIS — M6281 Muscle weakness (generalized): Secondary | ICD-10-CM

## 2016-10-12 DIAGNOSIS — G8929 Other chronic pain: Secondary | ICD-10-CM

## 2016-10-12 DIAGNOSIS — M25612 Stiffness of left shoulder, not elsewhere classified: Secondary | ICD-10-CM

## 2016-10-12 NOTE — Therapy (Signed)
[place], [place], [address] Phone: [phone]   Fax:  [phone]  Physical Therapy Treatment  Patient Details  Name: MACIN CZARNECKI MRN: [id number] Date of Birth: 08/31/1941 Referring Provider: Meridee Score MD  Encounter Date: 10/12/2016      PT End of Session - 10/12/16 0850    Visit Number 2   Date for PT Re-Evaluation 12/07/16   PT Start Time 0800   PT Stop Time 0900   PT Time Calculation (min) 60 min   Activity Tolerance Patient tolerated treatment well   Behavior During Therapy [facility] for tasks assessed/performed      Past Medical History:  Diagnosis Date  . Femur fracture ([place])   . Hip fracture ([place])   . Hypertension   . Malignant neoplasm of pancreas, part unspecified   . Other malignant lymphomas, unspecified site, extranodal and solid organ sites     Past Surgical History:  Procedure Laterality Date  . FEMUR SURGERY Bilateral   . LEG SURGERY Left   . SHOULDER SURGERY Left     There were no vitals filed for this visit.      Subjective Assessment - 10/12/16 0805    Subjective Patient has some questions about the HEP, reports unsure how to do them   Currently in Pain? Yes   Pain Score 2    Pain Location Shoulder   Pain Orientation Left                         [facility] Adult PT Treatment/Exercise - 10/12/16 0001      Knee/Hip Exercises: Aerobic   Nustep level 5 x 4 minutes     Shoulder Exercises: ROM/Strengthening   UBE (Upper Arm Bike) Level 2 x 4 minutew   Cybex Press 1 plate;20 reps   Cybex Press Limitations some pain with the horizontal abduction   Cybex Row 1.5 plate;20 reps   Wall Wash 20   Wall Pushups 20 reps   Other ROM/Strengthening Exercises lat pull down 15# with aome assist, again this is painful and needed assist, single arm scapular retraction with 5# on pulleys   Other ROM/Strengthening Exercises wall wash CW/CCW     Shoulder  Exercises: Stretch   Corner Stretch 2 reps;10 seconds     Modalities   Modalities Electrical Stimulation     Moist Heat Therapy   Number Minutes Moist Heat 15 Minutes   Moist Heat Location Shoulder     Electrical Stimulation   Electrical Stimulation Location left anterior shoulder   Electrical Stimulation Action IFC   Electrical Stimulation Parameters sitting   Electrical Stimulation Goals Pain     Manual Therapy   Manual Therapy Passive ROM   Passive ROM passive ROM with contract relax to gain some increase of motions                  PT Short Term Goals - 10/09/16 1450      PT SHORT TERM GOAL #1   Title pt will be I with inital HEP )   Time 4   Period Weeks   Status New           PT Long Term Goals - 10/09/16 1450      PT LONG TERM GOAL #1   Title He will be I with all HEP given as of last visit    Time 8   Period Weeks  Status New     PT LONG TERM GOAL #2   Title pt will increase L shoulder AROM flexion/ abduction to >/= 110 degrees and ER to >/= 30 degrees with </= 1/10 pain for functional mobility required for ADLs (09/20/2016)   Time 8   Period Weeks   Status New     PT LONG TERM GOAL #3   Title he will increase L shoulder strength to >/= 4-/5 in all  planes for functional lifting and carrying activities required for work related activities and ADLs (09/20/2016)   Time 8   Period Weeks   Status New     PT LONG TERM GOAL #4   Title pt will be able to lift and lower >/= 8# to and from an over head shelf with </= 1/10 pain for functional mobility, cooking/ cleaning, and 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 (09/20/2016)   Time 8   Period Weeks   Status New     PT LONG TERM GOAL #5   Title he will increase his FOTO score to </=36% limited to demonstrate improvement in function at discharge)   Time 8   Period Weeks   Status New     PT LONG TERM GOAL #6   Title He will increase L hip abductor/ extensor strength to  >/= 4+/5 to promote walking/ standing endurance while promoting safety    Time 8   Period Weeks   Status New     PT LONG TERM GOAL #7   Title he will be able walk/ stand for >/= 45 minutes with </= 1/10 pain for functional endurance required for ADLs and pt's personal goals or returning walking 2 miles a few times a week    Time 8   Period Weeks   Status New               Plan - 10/12/16 [id number]    Clinical Impression Statement Patient has some bone on bone crepitus with some motions, he has pain with horizontal abduction.  He seemed to gain ROM with some gentle contract relax.  He compensates greatly to raise the arm    PT Next Visit Plan work on ROM of the shoulder, function and strength of the LE's   Consulted and Agree with Plan of Care Patient      Patient will benefit from skilled therapeutic intervention in order to improve the following deficits and impairments:  Abnormal gait, Pain, Improper body mechanics, Postural dysfunction, Hypomobility, Decreased range of motion, Increased fascial restricitons, Decreased endurance, Decreased activity tolerance, Decreased strength  Visit Diagnosis: Chronic left shoulder pain  Stiffness of left shoulder, not elsewhere classified  Muscle weakness (generalized)     Problem List Patient Active Problem List   Diagnosis Date Noted  . 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 History of arthroscopy of left shoulder 07/12/2016  . Port catheter in place 02/22/2016  . 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 Vitamin D deficiency 11/21/2012  . Lymphoma ([place]) 06/13/2012    Sumner Boast., PT 10/12/2016, 8:52 AM  [place] [address] [place], [place], [address] Phone: [phone]   Fax:  [phone]  Name: EBONY SYVERTSON MRN: [id number] Date of Birth: 08-26-1941

## 2016-10-16 ENCOUNTER — Ambulatory Visit: Payer: Medicare HMO | Admitting: Physical Therapy

## 2016-10-16 ENCOUNTER — Encounter: Payer: Self-pay | Admitting: Physical Therapy

## 2016-10-16 DIAGNOSIS — G8929 Other chronic pain: Secondary | ICD-10-CM

## 2016-10-16 DIAGNOSIS — M6281 Muscle weakness (generalized): Secondary | ICD-10-CM

## 2016-10-16 DIAGNOSIS — M25512 Pain in left shoulder: Principal | ICD-10-CM

## 2016-10-16 DIAGNOSIS — M25612 Stiffness of left shoulder, not elsewhere classified: Secondary | ICD-10-CM

## 2016-10-16 NOTE — Therapy (Signed)
[place], [place], [address] Phone: [phone]   Fax:  [phone]  Physical Therapy Treatment  Patient Details  Name: [name] [name] MRN: [id number] Date of Birth: 1940-09-29 Referring Provider: Meridee Score MD  Encounter Date: 10/16/2016      PT End of Session - 10/16/16 1013    Visit Number 3   Date for PT Re-Evaluation 12/07/16   PT Start Time 0925   PT Stop Time 1028   PT Time Calculation (min) 63 min   Activity Tolerance Patient tolerated treatment well   Behavior During Therapy [facility] for tasks assessed/performed      Past Medical History:  Diagnosis Date  . Femur fracture ([place])   . Hip fracture ([place])   . Hypertension   . Malignant neoplasm of pancreas, part unspecified   . Other malignant lymphomas, unspecified site, extranodal and solid organ sites     Past Surgical History:  Procedure Laterality Date  . FEMUR SURGERY Bilateral   . LEG SURGERY Left   . SHOULDER SURGERY Left     There were no vitals filed for this visit.      Subjective Assessment - 10/16/16 0928    Subjective Not much pain at rest, he does reports stiffness and pain with reaching   Currently in Pain? No/denies   Aggravating Factors  pain when reaching in the left shoulder is 7-8/10                         [facility] Adult PT Treatment/Exercise - 10/16/16 0001      High Level Balance   High Level Balance Activities Tandem walking   High Level Balance Comments resisted gait all directions, toe walks, heel walks     Knee/Hip Exercises: Aerobic   Nustep level 5 x 4 minutes     Shoulder Exercises: Standing   External Rotation 20 reps;Theraband   Theraband Level (Shoulder External Rotation) Level 1 (Yellow)   Extension Strengthening;20 reps;Theraband   Theraband Level (Shoulder Extension) Level 4 (Blue)   Retraction 20 reps;Theraband   Theraband Level (Shoulder Retraction) Level 4 (Blue)    Other Standing Exercises body blade 20seconds     Shoulder Exercises: ROM/Strengthening   UBE (Upper Arm Bike) Level 5 x 5 minutew   Cybex Press 1 plate;20 reps   Cybex Row 1.5 plate;20 reps   Wall Wash 20   Wall Pushups 20 reps   Other ROM/Strengthening Exercises lat pull down 15# with aome assist, again this is painful and needed assist, single arm scapular retraction with 5# on pulleys   Other ROM/Strengthening Exercises wall wash CW/CCW     Shoulder Exercises: Stretch   Corner Stretch 2 reps;10 seconds     Moist Heat Therapy   Number Minutes Moist Heat 15 Minutes   Moist Heat Location Shoulder     Electrical Stimulation   Electrical Stimulation Location left anterior shoulder   Electrical Stimulation Action IFC   Electrical Stimulation Parameters sitting   Electrical Stimulation Goals Pain     Manual Therapy   Manual Therapy Passive ROM   Passive ROM passive ROM with contract relax to gain some increase of motions                  PT Short Term Goals - 10/16/16 1015      PT SHORT TERM GOAL #1   Title pt will be I with inital HEP )  PT Long Term Goals - 10/09/16 1450      PT LONG TERM GOAL #1   Title He will be I with all HEP given as of last visit    Time 8   Period Weeks   Status New     PT LONG TERM GOAL #2   Title pt will increase L shoulder AROM flexion/ abduction to >/= 110 degrees and ER to >/= 30 degrees with </= 1/10 pain for functional mobility required for ADLs (09/20/2016)   Time 8   Period Weeks   Status New     PT LONG TERM GOAL #3   Title he will increase L shoulder strength to >/= 4-/5 in all  planes for functional lifting and carrying activities required for work related activities and ADLs (09/20/2016)   Time 8   Period Weeks   Status New     PT LONG TERM GOAL #4   Title pt will be able to lift and lower >/= 8# to and from an over head shelf with </= 1/10 pain for functional mobility, cooking/ cleaning, and personal  goal of being able to change light bulb without assistance (09/20/2016)   Time 8   Period Weeks   Status New     PT LONG TERM GOAL #5   Title he will increase his FOTO score to </=36% limited to demonstrate improvement in function at discharge)   Time 8   Period Weeks   Status New     PT LONG TERM GOAL #6   Title He will increase L hip abductor/ extensor strength to >/= 4+/5 to promote walking/ standing endurance while promoting safety    Time 8   Period Weeks   Status New     PT LONG TERM GOAL #7   Title he will be able walk/ stand for >/= 45 minutes with </= 1/10 pain for functional endurance required for ADLs and pt's personal goals or returning walking 2 miles a few times a week    Time 8   Period Weeks   Status New               Plan - 10/16/16 1014    Clinical Impression Statement Some difficulty with balance, could not do tandem walk without assist, has the bone on bone crepitus, manually supporting the shoulder I can decrease this while he does the exercises, he is very tight but gains motions with PROM.   PT Next Visit Plan work on ROM of the shoulder, function and strength of the LE's   Consulted and Agree with Plan of Care Patient      Patient will benefit from skilled therapeutic intervention in order to improve the following deficits and impairments:  Abnormal gait, Pain, Improper body mechanics, Postural dysfunction, Hypomobility, Decreased range of motion, Increased fascial restricitons, Decreased endurance, Decreased activity tolerance, Decreased strength  Visit Diagnosis: Chronic left shoulder pain  Stiffness of left shoulder, not elsewhere classified  Muscle weakness (generalized)     Problem List Patient Active Problem List   Diagnosis Date Noted  . 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 History of arthroscopy of left shoulder 07/12/2016  . Port catheter in place 02/22/2016  . 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 Vitamin D deficiency 11/21/2012  . Lymphoma ([place]) 06/13/2012    Sumner Boast., PT 10/16/2016, 10:16 AM  [place],  [place], [address] Phone: [phone]   Fax:  [phone]  Name: [name] [name] MRN: [id number] Date of Birth: 01-Nov-1940

## 2016-10-19 ENCOUNTER — Ambulatory Visit: Payer: Medicare HMO | Attending: Orthopedic Surgery | Admitting: Physical Therapy

## 2016-10-19 ENCOUNTER — Encounter: Payer: Self-pay | Admitting: Physical Therapy

## 2016-10-19 DIAGNOSIS — M25512 Pain in left shoulder: Secondary | ICD-10-CM | POA: Diagnosis present

## 2016-10-19 DIAGNOSIS — M6281 Muscle weakness (generalized): Secondary | ICD-10-CM | POA: Insufficient documentation

## 2016-10-19 DIAGNOSIS — G8929 Other chronic pain: Secondary | ICD-10-CM | POA: Diagnosis present

## 2016-10-19 DIAGNOSIS — M25612 Stiffness of left shoulder, not elsewhere classified: Secondary | ICD-10-CM | POA: Diagnosis present

## 2016-10-19 DIAGNOSIS — M25562 Pain in left knee: Secondary | ICD-10-CM | POA: Insufficient documentation

## 2016-10-19 DIAGNOSIS — R293 Abnormal posture: Secondary | ICD-10-CM | POA: Diagnosis present

## 2016-10-19 NOTE — Therapy (Signed)
[place], [place], [address] Phone: [phone]   Fax:  [phone]  Physical Therapy Treatment  Patient Details  Name: [name] [name] MRN: [id number] Date of Birth: 05/07/1941 Referring Provider: Meridee Score MD  Encounter Date: 10/19/2016      PT End of Session - 10/19/16 1100    Visit Number 4   Number of Visits 17   Authorization Type Medicare: Kx by 15th visit, Progress note by 10th visit   PT Start Time 1015   PT Stop Time 1114   PT Time Calculation (min) 59 min   Activity Tolerance Patient tolerated treatment well   Behavior During Therapy [facility] for tasks assessed/performed      Past Medical History:  Diagnosis Date  . Femur fracture ([place])   . Hip fracture ([place])   . Hypertension   . Malignant neoplasm of pancreas, part unspecified   . Other malignant lymphomas, unspecified site, extranodal and solid organ sites     Past Surgical History:  Procedure Laterality Date  . FEMUR SURGERY Bilateral   . LEG SURGERY Left   . SHOULDER SURGERY Left     There were no vitals filed for this visit.      Subjective Assessment - 10/19/16 1018    Subjective "Not to bad" pt reports 7/10 pain in the L shoulder when reaching out    Currently in Pain? Yes   Pain Score 7    Pain Location Shoulder                         [facility] Adult PT Treatment/Exercise - 10/19/16 0001      Knee/Hip Exercises: Aerobic   Nustep level 5 x 4 minutes     Shoulder Exercises: Standing   External Rotation 20 reps;Theraband   Theraband Level (Shoulder External Rotation) Level 1 (Yellow)   Extension Strengthening;20 reps;Theraband   Theraband Level (Shoulder Extension) Level 4 (Blue)     Shoulder Exercises: ROM/Strengthening   UBE (Upper Arm Bike) Level 5 x 5 minutew   Cybex Press 1 plate;20 reps   Wall Wash 20   Wall Pushups 20 reps   Other ROM/Strengthening Exercises Rows 15lb 2x10, Lat  pull downs 15lb some assist needed 2x5   Other ROM/Strengthening Exercises wall wash CW/CCW     Shoulder Exercises: Stretch   Corner Stretch 2 reps;10 seconds     Modalities   Modalities Electrical Stimulation     Moist Heat Therapy   Number Minutes Moist Heat 15 Minutes   Moist Heat Location Shoulder     Electrical Stimulation   Electrical Stimulation Location left anterior shoulder   Electrical Stimulation Action IFC   Electrical Stimulation Parameters sitting   Electrical Stimulation Goals Pain     Manual Therapy   Manual Therapy Passive ROM   Passive ROM L shoulder PROM                  PT Short Term Goals - 10/16/16 1015      PT SHORT TERM GOAL #1   Title pt will be I with inital HEP )           PT Long Term Goals - 10/09/16 1450      PT LONG TERM GOAL #1   Title He will be I with all HEP given as of last visit    Time 8   Period Weeks   Status  New     PT LONG TERM GOAL #2   Title pt will increase L shoulder AROM flexion/ abduction to >/= 110 degrees and ER to >/= 30 degrees with </= 1/10 pain for functional mobility required for ADLs (09/20/2016)   Time 8   Period Weeks   Status New     PT LONG TERM GOAL #3   Title he will increase L shoulder strength to >/= 4-/5 in all  planes for functional lifting and carrying activities required for work related activities and ADLs (09/20/2016)   Time 8   Period Weeks   Status New     PT LONG TERM GOAL #4   Title pt will be able to lift and lower >/= 8# to and from an over head shelf with </= 1/10 pain for functional mobility, cooking/ cleaning, and 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 (09/20/2016)   Time 8   Period Weeks   Status New     PT LONG TERM GOAL #5   Title he will increase his FOTO score to </=36% limited to demonstrate improvement in function at discharge)   Time 8   Period Weeks   Status New     PT LONG TERM GOAL #6   Title He will increase L hip abductor/  extensor strength to >/= 4+/5 to promote walking/ standing endurance while promoting safety    Time 8   Period Weeks   Status New     PT LONG TERM GOAL #7   Title he will be able walk/ stand for >/= 45 minutes with </= 1/10 pain for functional endurance required for ADLs and pt's personal goals or returning walking 2 miles a few times a week    Time 8   Period Weeks   Status New               Plan - 10/19/16 1101    Clinical Impression Statement Pt with bone on bone crepitus with MT. Pt with some pain with shoulder lat pull downs requiring some assist. Pt with little motion with L shoulder ER. Continues to gain most motion with PROM.   Rehab Potential Good   PT Frequency 2x / week   PT Duration 8 weeks   PT Treatment/Interventions ADLs/Self Care Home Management;Cryotherapy;Electrical Stimulation;Iontophoresis 4mg /ml Dexamethasone;Moist Heat;Ultrasound;Therapeutic activities;Therapeutic exercise;Dry needling;Taping;Manual techniques;Patient/family education;Neuromuscular re-education   PT Next Visit Plan work on ROM of the shoulder, function and strength of the LE's      Patient will benefit from skilled therapeutic intervention in order to improve the following deficits and impairments:  Abnormal gait, Pain, Improper body mechanics, Postural dysfunction, Hypomobility, Decreased range of motion, Increased fascial restricitons, Decreased endurance, Decreased activity tolerance, Decreased strength  Visit Diagnosis: Chronic left shoulder pain  Stiffness of left shoulder, not elsewhere classified  Muscle weakness (generalized)     Problem List Patient Active Problem List   Diagnosis Date Noted  . 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 History of arthroscopy of left shoulder 07/12/2016  . Port catheter in place 02/22/2016  . 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 Vitamin D deficiency 11/21/2012  . Lymphoma ([place]) 06/13/2012     Scot Jun, PTA  10/19/2016, 11:06 AM  [place] [address] [place], [place], [address] Phone: [phone]   Fax:  [phone]  Name: [name] [name] MRN: [id number] Date of Birth: 04/11/1941

## 2016-10-23 ENCOUNTER — Ambulatory Visit: Payer: Medicare HMO | Admitting: Physical Therapy

## 2016-10-23 ENCOUNTER — Encounter: Payer: Self-pay | Admitting: Physical Therapy

## 2016-10-23 DIAGNOSIS — G8929 Other chronic pain: Secondary | ICD-10-CM

## 2016-10-23 DIAGNOSIS — M25512 Pain in left shoulder: Secondary | ICD-10-CM | POA: Diagnosis not present

## 2016-10-23 DIAGNOSIS — M25612 Stiffness of left shoulder, not elsewhere classified: Secondary | ICD-10-CM

## 2016-10-23 DIAGNOSIS — M6281 Muscle weakness (generalized): Secondary | ICD-10-CM

## 2016-10-23 NOTE — Therapy (Signed)
[place], [place], [address] Phone: [phone]   Fax:  [phone]  Physical Therapy Treatment  Patient Details  Name: [name] [name] MRN: [id number] Date of Birth: 05-18-1941 Referring Provider: Meridee Score MD  Encounter Date: 10/23/2016      PT End of Session - 10/23/16 1025    Visit Number 56   Number of Visits 17   Date for PT Re-Evaluation 12/07/16   Authorization Type Medicare: Kx by 15th visit, Progress note by 10th visit   PT Start Time 0931   PT Stop Time 1031   PT Time Calculation (min) 60 min   Activity Tolerance Patient tolerated treatment well   Behavior During Therapy [facility] for tasks assessed/performed      Past Medical History:  Diagnosis Date  . Femur fracture ([place])   . Hip fracture ([place])   . Hypertension   . Malignant neoplasm of pancreas, part unspecified   . Other malignant lymphomas, unspecified site, extranodal and solid organ sites     Past Surgical History:  Procedure Laterality Date  . FEMUR SURGERY Bilateral   . LEG SURGERY Left   . SHOULDER SURGERY Left     There were no vitals filed for this visit.      Subjective Assessment - 10/23/16 0932    Subjective "Pt reports that he had a cold this weekend and was unable to do much" Reports no fever just didn't feel well.    Currently in Pain? Yes   Pain Score 8    Pain Location --  When he extends out and come back   Pain Orientation Left                         [facility] Adult PT Treatment/Exercise - 10/23/16 0001      Knee/Hip Exercises: Aerobic   Nustep level 5 x 5 minutes     Knee/Hip Exercises: Seated   Sit to Sand 2 sets;10 reps;without UE support  blue ball      Shoulder Exercises: Standing   Flexion 20 reps;AROM   Extension Strengthening;20 reps;Theraband   Theraband Level (Shoulder Extension) Level 4 (Blue)   Row Both;20 reps;Theraband   Theraband Level (Shoulder Row) Level  4 (Blue)     Shoulder Exercises: ROM/Strengthening   UBE (Upper Arm Bike) Level 5 x 5 minutew   Cybex Press 1 plate;20 reps   Wall Pushups 20 reps   Other ROM/Strengthening Exercises Rows 15lb 2x10, Lat pull downs 15lb some assist needed 2x5     Modalities   Modalities Electrical Stimulation     Moist Heat Therapy   Number Minutes Moist Heat 15 Minutes   Moist Heat Location Shoulder     Electrical Stimulation   Electrical Stimulation Location left anterior shoulder   Electrical Stimulation Action IFC   Electrical Stimulation Parameters sitting     Manual Therapy   Manual Therapy Passive ROM   Passive ROM L shoulder PROM                  PT Short Term Goals - 10/16/16 1015      PT SHORT TERM GOAL #1   Title pt will be I with inital HEP )           PT Long Term Goals - 10/09/16 1450      PT LONG TERM GOAL #1   Title He will be I with  all HEP given as of last visit    Time 8   Period Weeks   Status New     PT LONG TERM GOAL #2   Title pt will increase L shoulder AROM flexion/ abduction to >/= 110 degrees and ER to >/= 30 degrees with </= 1/10 pain for functional mobility required for ADLs (09/20/2016)   Time 8   Period Weeks   Status New     PT LONG TERM GOAL #3   Title he will increase L shoulder strength to >/= 4-/5 in all  planes for functional lifting and carrying activities required for work related activities and ADLs (09/20/2016)   Time 8   Period Weeks   Status New     PT LONG TERM GOAL #4   Title pt will be able to lift and lower >/= 8# to and from an over head shelf with </= 1/10 pain for functional mobility, cooking/ cleaning, and 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 (09/20/2016)   Time 8   Period Weeks   Status New     PT LONG TERM GOAL #5   Title he will increase his FOTO score to </=36% limited to demonstrate improvement in function at discharge)   Time 8   Period Weeks   Status New     PT LONG TERM GOAL  #6   Title He will increase L hip abductor/ extensor strength to >/= 4+/5 to promote walking/ standing endurance while promoting safety    Time 8   Period Weeks   Status New     PT LONG TERM GOAL #7   Title he will be able walk/ stand for >/= 45 minutes with </= 1/10 pain for functional endurance required for ADLs and pt's personal goals or returning walking 2 miles a few times a week    Time 8   Period Weeks   Status New               Plan - 10/23/16 1026    Clinical Impression Statement Pt again gains most L shoulder ROM passively. PT very limited with L shoulder ER and flexion. Less assist required with lat pull downs. Some L shoulder elevation with L shoulder flexion.   Rehab Potential Good   PT Frequency 2x / week   PT Duration 8 weeks   PT Treatment/Interventions ADLs/Self Care Home Management;Cryotherapy;Electrical Stimulation;Iontophoresis 4mg /ml Dexamethasone;Moist Heat;Ultrasound;Therapeutic activities;Therapeutic exercise;Dry needling;Taping;Manual techniques;Patient/family education;Neuromuscular re-education   PT Next Visit Plan work on ROM of the shoulder, function and strength of the LE's      Patient will benefit from skilled therapeutic intervention in order to improve the following deficits and impairments:  Abnormal gait, Pain, Improper body mechanics, Postural dysfunction, Hypomobility, Decreased range of motion, Increased fascial restricitons, Decreased endurance, Decreased activity tolerance, Decreased strength  Visit Diagnosis: Chronic left shoulder pain  Stiffness of left shoulder, not elsewhere classified  Muscle weakness (generalized)     Problem List Patient Active Problem List   Diagnosis Date Noted  . 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 History of arthroscopy of left shoulder 07/12/2016  . Port catheter in place 02/22/2016  . 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 Vitamin D deficiency  11/21/2012  . Lymphoma ([place]) 06/13/2012    Scot Jun 10/23/2016, 10:29 AM  [place] [id number] [place], [place], [address] Phone: [phone]  Fax:  [phone]  Name: [name] [name] MRN: [id number] Date of Birth: [birth year]

## 2016-10-25 ENCOUNTER — Ambulatory Visit: Payer: Medicare HMO | Admitting: Physical Therapy

## 2016-10-26 ENCOUNTER — Ambulatory Visit: Payer: Medicare HMO | Admitting: Physical Therapy

## 2016-10-26 DIAGNOSIS — M6281 Muscle weakness (generalized): Secondary | ICD-10-CM

## 2016-10-26 DIAGNOSIS — M25512 Pain in left shoulder: Secondary | ICD-10-CM | POA: Diagnosis not present

## 2016-10-26 DIAGNOSIS — R293 Abnormal posture: Secondary | ICD-10-CM

## 2016-10-26 DIAGNOSIS — G8929 Other chronic pain: Secondary | ICD-10-CM

## 2016-10-26 DIAGNOSIS — M25612 Stiffness of left shoulder, not elsewhere classified: Secondary | ICD-10-CM

## 2016-10-26 NOTE — Therapy (Signed)
[place], [place], [address] Phone: [phone]   Fax:  [phone]  Physical Therapy Treatment  Patient Details  Name: [name] [name] MRN: [id number] Date of Birth: November 06, 1940 Referring Provider: Meridee Score MD  Encounter Date: 10/26/2016      PT End of Session - 10/26/16 0931    Visit Number 6   Number of Visits 17   Date for PT Re-Evaluation 12/07/16   PT Start Time 0930   PT Stop Time 1015   PT Time Calculation (min) 45 min   Activity Tolerance Patient tolerated treatment well   Behavior During Therapy [facility] for tasks assessed/performed      Past Medical History:  Diagnosis Date  . Femur fracture ([place])   . Hip fracture ([place])   . Hypertension   . Malignant neoplasm of pancreas, part unspecified   . Other malignant lymphomas, unspecified site, extranodal and solid organ sites     Past Surgical History:  Procedure Laterality Date  . FEMUR SURGERY Bilateral   . LEG SURGERY Left   . SHOULDER SURGERY Left     There were no vitals filed for this visit.      Subjective Assessment - 10/26/16 0928    Subjective Pt arriving to therapy reporting no pain at rest. Pt reporting only pain with movements.    Limitations Lifting;Walking;House hold activities;Standing   How long can you sit comfortably? 1 hour   How long can you stand comfortably? 30 min   How long can you walk comfortably? 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 Yes   Pain Score 7    Pain Location Shoulder   Pain Orientation Left   Pain Descriptors / Indicators Aching;Sore   Pain Type Surgical pain   Pain Onset More than a month ago   Aggravating Factors  pain with movements in  left shoulder    Pain Relieving Factors rest   Effect of Pain on Daily Activities difficulty with ADL's    Multiple Pain Sites No            [facility] PT Assessment - 10/26/16 0001      PROM   Left Shoulder Flexion 110 Degrees   Left Shoulder ABduction 80 Degrees   Left Shoulder External Rotation 8 Degrees                     [facility] Adult PT Treatment/Exercise - 10/26/16 0001      Shoulder Exercises: Standing   Other Standing Exercises shoulder isometrics     Shoulder Exercises: ROM/Strengthening   UBE (Upper Arm Bike) Level 5 x 5 minutew   Wall Pushups 20 reps     Shoulder Exercises: Stretch   Corner Stretch 10 seconds;5 reps     Modalities   Modalities Electrical Stimulation     Moist Heat Therapy   Number Minutes Moist Heat 15 Minutes   Moist Heat Location Shoulder     Electrical Stimulation   Electrical Stimulation Location left anterior shoulder   Electrical Stimulation Action IFC   Electrical Stimulation Parameters sitting     Manual Therapy   Manual Therapy Passive ROM   Passive ROM L shoulder  PROM                PT Education - 10/26/16 1008    Education provided Yes   Education Details reviewed HEP, added shoulder depression, continue to focus on ER and abduction   Person(s) Educated Patient   Methods Explanation;Demonstration;Verbal cues   Comprehension Verbalized understanding;Returned demonstration;Verbal cues required          PT Short Term Goals - 10/26/16 [id number]      PT SHORT TERM GOAL #1   Title pt will be I with inital HEP )   Time 4   Period Weeks   Status New     PT SHORT TERM GOAL #2   Title pt will be able to verbalize and demo techniques to prevent and reduce L shoulder pain and inflammation via RICE and HEP (08/25/2016)   Time 4   Period Weeks   Status On-going     PT SHORT TERM GOAL #3   Title he will increase L shoulder AROM  flexion/ abduction to >/= 90 degrees and ER to >/= 5 with </= 5/10 pain to promote  functional mobility (08/25/2016)   Baseline ER was -20 degrees from neutral (assessed in neutral with elbow at side)   Time 4   Period Weeks   Status On-going     PT SHORT TERM GOAL #4   Title he will increase his grip strength by >/= 6# in the L to demonstrate improvement in shoulder function (08/25/2016)   Time 4   Period Weeks   Status On-going     PT SHORT TERM GOAL #5   Title He will demonstrate reduce pain and tightness in the quad/ lateral hip with standing/ walking </= 2/10 and report no incidence of buckling or giving way within the last 2 weeks (08/25/2016)   Time 4   Period Weeks   Status New           PT Long Term Goals - 10/09/16 1450      PT LONG TERM GOAL #1   Title He will be I with all HEP given as of last visit    Time 8   Period Weeks   Status New     PT LONG TERM GOAL #2   Title pt will increase L shoulder AROM flexion/ abduction to >/= 110 degrees and ER to >/= 30 degrees with </= 1/10 pain for functional mobility required for ADLs (09/20/2016)   Time 8   Period Weeks   Status New     PT LONG TERM GOAL #3   Title he will increase L shoulder strength to >/= 4-/5 in all  planes for functional lifting and carrying activities required for work related activities and ADLs (09/20/2016)   Time 8   Period Weeks   Status New     PT LONG TERM GOAL #4   Title pt will be able to lift and lower >/= 8# to and from an over head shelf with </= 1/10 pain for functional mobility, cooking/ cleaning, and 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 (09/20/2016)   Time 8   Period Weeks   Status New     PT LONG TERM GOAL #5   Title he will increase his FOTO score to </=36% limited to demonstrate improvement in function at discharge)   Time 8   Period Weeks   Status New     PT LONG TERM GOAL #6   Title He will  increase L hip abductor/ extensor strength to >/= 4+/5 to promote walking/ standing endurance while promoting safety    Time 8   Period  Weeks   Status New     PT LONG TERM GOAL #7   Title he will be able walk/ stand for >/= 45 minutes with </= 1/10 pain for functional endurance required for ADLs and pt's personal goals or returning walking 2 miles a few times a week    Time 8   Period Weeks   Status New               Plan - 10/26/16 [id number]    Clinical Impression Statement Pt arriving to therapy today reporting no pain  at rest, but 6-7/10 pain with movements of the left shoulder. Pt tolerated treatment well still with limitations in L shoulder ROM. Skilled PT needed to progress pt toward his goals.    Rehab Potential Good   PT Frequency --   PT Duration --   PT Treatment/Interventions ADLs/Self Care Home Management;Cryotherapy;Electrical Stimulation;Iontophoresis 4mg /ml Dexamethasone;Moist Heat;Ultrasound;Therapeutic activities;Therapeutic exercise;Dry needling;Taping;Manual techniques;Patient/family education;Neuromuscular re-education   PT Next Visit Plan work on ROM of the shoulder, function and strength of the LE's   PT Home Exercise Plan table slides flexion/ scaption angles, scapular retraction, wand ER, sit to to stand, LAQ, standing hip abduction/ extension. Shoulder ER stretch at doorway, piriformis stretch   Consulted and Agree with Plan of Care --      Patient will benefit from skilled therapeutic intervention in order to improve the following deficits and impairments:  Abnormal gait, Pain, Improper body mechanics, Postural dysfunction, Hypomobility, Decreased range of motion, Increased fascial restricitons, Decreased endurance, Decreased activity tolerance, Decreased strength  Visit Diagnosis: Chronic left shoulder pain  Stiffness of left shoulder, not elsewhere classified  Muscle weakness (generalized)  Abnormal posture     Problem List Patient Active Problem List   Diagnosis Date Noted  . 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 History of arthroscopy of left shoulder 07/12/2016   . Port catheter in place 02/22/2016  . 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 Vitamin D deficiency 11/21/2012  . Lymphoma ([place]) 06/13/2012    Oretha Caprice, MPT 10/26/2016, 10:25 AM  [place] [id number] [facility] [place], [place], [address] Phone: [phone]   Fax:  [phone]  Name: [name] [name] MRN: [id number] Date of Birth: 29-Apr-1941

## 2016-10-30 ENCOUNTER — Ambulatory Visit: Payer: Medicare HMO | Admitting: Physical Therapy

## 2016-10-30 ENCOUNTER — Encounter: Payer: Self-pay | Admitting: Physical Therapy

## 2016-10-30 DIAGNOSIS — M25612 Stiffness of left shoulder, not elsewhere classified: Secondary | ICD-10-CM

## 2016-10-30 DIAGNOSIS — M6281 Muscle weakness (generalized): Secondary | ICD-10-CM

## 2016-10-30 DIAGNOSIS — M25512 Pain in left shoulder: Principal | ICD-10-CM

## 2016-10-30 DIAGNOSIS — G8929 Other chronic pain: Secondary | ICD-10-CM

## 2016-10-30 NOTE — Therapy (Signed)
[place], [place], [address] Phone: [phone]   Fax:  [phone]  Physical Therapy Treatment  Patient Details  Name: [name] [name] MRN: [id number] Date of Birth: 1941-07-24 Referring Provider: Meridee Score MD  Encounter Date: 10/30/2016      PT End of Session - 10/30/16 1016    Visit Number 7   Number of Visits 17   Date for PT Re-Evaluation 12/07/16   Authorization Type Medicare: Kx by 15th visit, Progress note by 10th visit   PT Start Time 0930   PT Stop Time 1030   PT Time Calculation (min) 60 min   Activity Tolerance Patient tolerated treatment well   Behavior During Therapy [facility] for tasks assessed/performed      Past Medical History:  Diagnosis Date  . Femur fracture ([place])   . Hip fracture ([place])   . Hypertension   . Malignant neoplasm of pancreas, part unspecified   . Other malignant lymphomas, unspecified site, extranodal and solid organ sites     Past Surgical History:  Procedure Laterality Date  . FEMUR SURGERY Bilateral   . LEG SURGERY Left   . SHOULDER SURGERY Left     There were no vitals filed for this visit.      Subjective Assessment - 10/30/16 0929    Subjective "Not too bad" "I don't think my shoulder has improved much" "Table exercises dose not hurt as much as going up wall"   Currently in Pain? Yes   Pain Score 8    Pain Location Shoulder  when he reaches out   Pain Orientation Left                         [facility] Adult PT Treatment/Exercise - 10/30/16 0001      Knee/Hip Exercises: Aerobic   Nustep level 5 x 5 minutes     Shoulder Exercises: Seated   Other Seated Exercises seated High to low rows 35lb 2x15      Shoulder Exercises: Standing   Other Standing Exercises LUE shoulder ext 10lb 2x10   Other Standing Exercises 2 level cabinet reaches 1lb 2x5 flexion      Shoulder Exercises: ROM/Strengthening   UBE (Upper Arm Bike) Level  5 x 5 minutew   Wall Pushups 20 reps   Other ROM/Strengthening Exercises Rows 15lb 2x10, Lat pull downs LUE 5lb some assist needed 2x5     Modalities   Modalities Electrical Stimulation     Moist Heat Therapy   Number Minutes Moist Heat 15 Minutes   Moist Heat Location Shoulder     Electrical Stimulation   Electrical Stimulation Location left anterior shoulder   Electrical Stimulation Action IFC   Electrical Stimulation Parameters sitting     Manual Therapy   Manual Therapy Passive ROM   Passive ROM L shoulder PROM                  PT Short Term Goals - 10/26/16 [id number]      PT SHORT TERM GOAL #1   Title pt will be I with inital HEP )   Time 4   Period Weeks   Status New     PT SHORT TERM GOAL #2   Title pt will be able to verbalize and demo techniques to prevent and reduce L shoulder pain and inflammation via RICE and HEP (08/25/2016)   Time 4   Period Weeks  Status On-going     PT SHORT TERM GOAL #3   Title he will increase L shoulder AROM  flexion/ abduction to >/= 90 degrees and ER to >/= 5 with </= 5/10 pain to promote functional mobility (08/25/2016)   Baseline ER was -20 degrees from neutral (assessed in neutral with elbow at side)   Time 4   Period Weeks   Status On-going     PT SHORT TERM GOAL #4   Title he will increase his grip strength by >/= 6# in the L to demonstrate improvement in shoulder function (08/25/2016)   Time 4   Period Weeks   Status On-going     PT SHORT TERM GOAL #5   Title He will demonstrate reduce pain and tightness in the quad/ lateral hip with standing/ walking </= 2/10 and report no incidence of buckling or giving way within the last 2 weeks (08/25/2016)   Time 4   Period Weeks   Status New           PT Long Term Goals - 10/09/16 1450      PT LONG TERM GOAL #1   Title He will be I with all HEP given as of last visit    Time 8   Period Weeks   Status New     PT LONG TERM GOAL #2   Title pt will increase L  shoulder AROM flexion/ abduction to >/= 110 degrees and ER to >/= 30 degrees with </= 1/10 pain for functional mobility required for ADLs (09/20/2016)   Time 8   Period Weeks   Status New     PT LONG TERM GOAL #3   Title he will increase L shoulder strength to >/= 4-/5 in all  planes for functional lifting and carrying activities required for work related activities and ADLs (09/20/2016)   Time 8   Period Weeks   Status New     PT LONG TERM GOAL #4   Title pt will be able to lift and lower >/= 8# to and from an over head shelf with </= 1/10 pain for functional mobility, cooking/ cleaning, and 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 (09/20/2016)   Time 8   Period Weeks   Status New     PT LONG TERM GOAL #5   Title he will increase his FOTO score to </=36% limited to demonstrate improvement in function at discharge)   Time 8   Period Weeks   Status New     PT LONG TERM GOAL #6   Title He will increase L hip abductor/ extensor strength to >/= 4+/5 to promote walking/ standing endurance while promoting safety    Time 8   Period Weeks   Status New     PT LONG TERM GOAL #7   Title he will be able walk/ stand for >/= 45 minutes with </= 1/10 pain for functional endurance required for ADLs and pt's personal goals or returning walking 2 miles a few times a week    Time 8   Period Weeks   Status New               Plan - 10/30/16 1017    Clinical Impression Statement Pt able to tolerated new exercise interventions but reports some pain. Shoulder elevation occurs with most active L shoulder movements. L shoulder motion remains limited.     Rehab Potential Good   PT Frequency 2x / week   PT  Duration 8 weeks   PT Treatment/Interventions ADLs/Self Care Home Management;Cryotherapy;Electrical Stimulation;Iontophoresis 4mg /ml Dexamethasone;Moist Heat;Ultrasound;Therapeutic activities;Therapeutic exercise;Dry needling;Taping;Manual techniques;Patient/family  education;Neuromuscular re-education   PT Next Visit Plan work on ROM of the shoulder, function and strength of the LE's      Patient will benefit from skilled therapeutic intervention in order to improve the following deficits and impairments:  Abnormal gait, Pain, Improper body mechanics, Postural dysfunction, Hypomobility, Decreased range of motion, Increased fascial restricitons, Decreased endurance, Decreased activity tolerance, Decreased strength  Visit Diagnosis: Chronic left shoulder pain  Stiffness of left shoulder, not elsewhere classified  Muscle weakness (generalized)     Problem List Patient Active Problem List   Diagnosis Date Noted  . 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 History of arthroscopy of left shoulder 07/12/2016  . Port catheter in place 02/22/2016  . 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 Vitamin D deficiency 11/21/2012  . Lymphoma ([place]) 06/13/2012    Scot Jun 10/30/2016, 10:21 AM  [place] [id number] [place], [place], [address] Phone: [phone]   Fax:  [phone]  Name: NOAH QUINCEY MRN: [id number] Date of Birth: 1940/11/24

## 2016-11-01 ENCOUNTER — Encounter: Payer: Self-pay | Admitting: Physical Therapy

## 2016-11-01 ENCOUNTER — Ambulatory Visit: Payer: Medicare HMO | Admitting: Physical Therapy

## 2016-11-01 DIAGNOSIS — M25512 Pain in left shoulder: Principal | ICD-10-CM

## 2016-11-01 DIAGNOSIS — G8929 Other chronic pain: Secondary | ICD-10-CM

## 2016-11-01 DIAGNOSIS — M25612 Stiffness of left shoulder, not elsewhere classified: Secondary | ICD-10-CM

## 2016-11-01 DIAGNOSIS — M6281 Muscle weakness (generalized): Secondary | ICD-10-CM

## 2016-11-01 NOTE — Therapy (Signed)
[place], [place], [address] Phone: [phone]   Fax:  [phone]  Physical Therapy Treatment  Patient Details  Name: [name] [name] MRN: [id number] Date of Birth: 1941/04/02 Referring Provider: Meridee Score MD  Encounter Date: 11/01/2016      PT End of Session - 11/01/16 1023    Visit Number 8   Number of Visits 17   Date for PT Re-Evaluation 12/07/16   PT Start Time 0930   PT Stop Time 1034   PT Time Calculation (min) 64 min   Activity Tolerance Patient tolerated treatment well   Behavior During Therapy [facility] for tasks assessed/performed      Past Medical History:  Diagnosis Date  . Femur fracture ([place])   . Hip fracture ([place])   . Hypertension   . Malignant neoplasm of pancreas, part unspecified   . Other malignant lymphomas, unspecified site, extranodal and solid organ sites     Past Surgical History:  Procedure Laterality Date  . FEMUR SURGERY Bilateral   . LEG SURGERY Left   . SHOULDER SURGERY Left     There were no vitals filed for this visit.      Subjective Assessment - 11/01/16 0927    Subjective "I think Im going to have to live with the pain"   Currently in Pain? Yes   Pain Score 8   when I pull back   Pain Location Shoulder   Pain Orientation Left            [facility] PT Assessment - 11/01/16 0001      AROM   Left Shoulder Flexion 86 Degrees   Left Shoulder ABduction 80 Degrees                     [facility] Adult PT Treatment/Exercise - 11/01/16 0001      Knee/Hip Exercises: Aerobic   Nustep level 5 x 5 minutes     Shoulder Exercises: Standing   Horizontal ABduction 15 reps;Theraband   Theraband Level (Shoulder Horizontal ABduction) Level 2 (Red)   External Rotation 20 reps;Theraband   Theraband Level (Shoulder External Rotation) Level 2 (Red)  some assist    Internal Rotation Theraband;20 reps   Theraband Level (Shoulder Internal  Rotation) Level 2 (Red)   Extension Strengthening;Theraband;15 reps  x2   Theraband Level (Shoulder Extension) Level 4 (Blue)   Other Standing Exercises 2 level cabinet reaches 1lb 2x5 flexion      Shoulder Exercises: ROM/Strengthening   UBE (Upper Arm Bike) Level 3.5 22frd/3rev   Other ROM/Strengthening Exercises chest press 10lb 3x10    Other ROM/Strengthening Exercises bicep curls 5lb 2x15      Modalities   Modalities Electrical Stimulation     Moist Heat Therapy   Number Minutes Moist Heat 15 Minutes   Moist Heat Location Shoulder     Electrical Stimulation   Electrical Stimulation Location left anterior shoulder   Electrical Stimulation Action IFC    Electrical Stimulation Parameters sitting   Electrical Stimulation Goals Pain     Manual Therapy   Manual Therapy Passive ROM   Passive ROM L shoulder PROM                  PT Short Term Goals - 11/01/16 0930      PT SHORT TERM GOAL #1   Title pt will be I with inital HEP )   Status Achieved  PT Long Term Goals - 11/01/16 0931      PT LONG TERM GOAL #1   Title He will be I with all HEP given as of last visit    Status Achieved     PT LONG TERM GOAL #3   Title he will increase L shoulder strength to >/= 4-/5 in all  planes for functional lifting and carrying activities required for work related activities and ADLs (09/20/2016)   Status On-going     PT LONG TERM GOAL #4   Title pt will be able to lift and lower >/= 8# to and from an over head shelf with </= 1/10 pain for functional mobility, cooking/ cleaning, and 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 (09/20/2016)   Status On-going     PT LONG TERM GOAL #5   Title he will increase his FOTO score to </=36% limited to demonstrate improvement in function at discharge)   Status On-going     PT LONG TERM GOAL #6   Title He will increase L hip abductor/ extensor strength to >/= 4+/5 to promote walking/ standing endurance  while promoting safety    Status On-going               Plan - 11/01/16 1023    Clinical Impression Statement Pt continues to have L shoulder elevation with all active movements. Little ROM achieved with resisted ER requiring some assist from therapist. Pt reports decrease pain with 2 level cabinet reaches compared to the first time.   Rehab Potential Fair   PT Frequency 2x / week   PT Duration 8 weeks   PT Treatment/Interventions ADLs/Self Care Home Management;Cryotherapy;Electrical Stimulation;Iontophoresis 4mg /ml Dexamethasone;Moist Heat;Ultrasound;Therapeutic activities;Therapeutic exercise;Dry needling;Taping;Manual techniques;Patient/family education;Neuromuscular re-education   PT Next Visit Plan work on ROM of the shoulder, function and strength of the LE's      Patient will benefit from skilled therapeutic intervention in order to improve the following deficits and impairments:     Visit Diagnosis: Chronic left shoulder pain  Stiffness of left shoulder, not elsewhere classified  Muscle weakness (generalized)     Problem List Patient Active Problem List   Diagnosis Date Noted  . 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 History of arthroscopy of left shoulder 07/12/2016  . Port catheter in place 02/22/2016  . 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 Vitamin D deficiency 11/21/2012  . Lymphoma ([place]) 06/13/2012    Scot Jun, PTA  11/01/2016, 10:29 AM  [place] [address] [place], [place], [address] Phone: [phone]   Fax:  [phone]  Name: KHALIK HAYLEY MRN: [id number] Date of Birth: 1941-04-20

## 2016-11-02 ENCOUNTER — Ambulatory Visit (INDEPENDENT_AMBULATORY_CARE_PROVIDER_SITE_OTHER): Payer: Medicare HMO | Admitting: Orthopedic Surgery

## 2016-11-02 ENCOUNTER — Encounter (INDEPENDENT_AMBULATORY_CARE_PROVIDER_SITE_OTHER): Payer: Self-pay | Admitting: Orthopedic Surgery

## 2016-11-02 VITALS — Ht 69.0 in | Wt 182.0 lb

## 2016-11-02 DIAGNOSIS — M19012 Primary osteoarthritis, left shoulder: Secondary | ICD-10-CM

## 2016-11-02 DIAGNOSIS — M7502 Adhesive capsulitis of left shoulder: Secondary | ICD-10-CM

## 2016-11-02 NOTE — Progress Notes (Signed)
   Office Visit Note   Patient: [name] [name]           Date of Birth: Feb 25, 1941           MRN: [id number] Visit Date: 11/02/2016              Requested by: [name], MD [place], [place] [id number] PCP: [name], MD  Chief Complaint  Patient presents with  . Left Shoulder - Follow-up    06/27/16 left shoulder scope and debridement     HPI: Patient is a 76 y.o male that presents for follow up of left shoulder. Patients states he is not significantly better. He has continued with physical therapy, he just had a visit yesterday. His range of motion has improved slightly. He cannot recall the injection he received on 09/20/16 with Erin. Maxcine Ham, RT    Assessment & Plan: Visit Diagnoses:  1. Adhesive capsulitis of left shoulder   2. Primary osteoarthritis of left shoulder     Plan: Continue physical therapy and try to push through the pain with his range of motion exercises patient is getting a little better and has no pain at rest. Discussed that his options he fails this conservative care would be a reverse total shoulder and would recommend following up with Dr. Marlou Sa.  Follow-Up Instructions: Return if symptoms worsen or fail to improve.   Ortho Exam Examination patient is alert oriented no adenopathy well-dressed normal affect normal respiratory effort he has a normal gait. Examination is full range of motion of right shoulder left shoulder has abduction and flexion to about 80. He has 0 of external rotation about 20 of internal rotation he injured his left hand up to his ear and into his back pocket but has no further range of motion.  Imaging: No results found.  Orders:  No orders of the defined types were placed in this encounter.  No orders of the defined types were placed in this encounter.    Procedures: No procedures performed  Clinical Data: No additional findings.  Subjective: Review of  Systems  Objective: Vital Signs: Ht 5\' 9"  (1.753 m)   Wt 182 lb (82.6 kg)   BMI 26.88 kg/m   Specialty Comments:  No specialty comments available.  PMFS History: Patient Active Problem List   Diagnosis Date Noted  . 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 History of arthroscopy of left shoulder 07/12/2016  . Port catheter in place 02/22/2016  . 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 Vitamin D deficiency 11/21/2012  . Lymphoma ([place]) 06/13/2012   Past Medical History:  Diagnosis Date  . Femur fracture ([place])   . Hip fracture ([place])   . Hypertension   . Malignant neoplasm of pancreas, part unspecified   . Other malignant lymphomas, unspecified site, extranodal and solid organ sites     History reviewed. No pertinent family history.  Past Surgical History:  Procedure Laterality Date  . FEMUR SURGERY Bilateral   . LEG SURGERY Left   . SHOULDER SURGERY Left    Social History   Occupational History  . Not on file.   Social History Main Topics  . Smoking status: Never Smoker  . Smokeless tobacco: Never Used  . Alcohol use No  . Drug use: No  . Sexual activity: Not on file

## 2016-11-06 ENCOUNTER — Encounter: Payer: Self-pay | Admitting: Physical Therapy

## 2016-11-06 ENCOUNTER — Ambulatory Visit: Payer: Medicare HMO | Admitting: Physical Therapy

## 2016-11-06 DIAGNOSIS — G8929 Other chronic pain: Secondary | ICD-10-CM

## 2016-11-06 DIAGNOSIS — M6281 Muscle weakness (generalized): Secondary | ICD-10-CM

## 2016-11-06 DIAGNOSIS — M25512 Pain in left shoulder: Secondary | ICD-10-CM | POA: Diagnosis not present

## 2016-11-06 DIAGNOSIS — M25562 Pain in left knee: Secondary | ICD-10-CM

## 2016-11-06 DIAGNOSIS — M25612 Stiffness of left shoulder, not elsewhere classified: Secondary | ICD-10-CM

## 2016-11-06 NOTE — Therapy (Signed)
[place], [place], [address] Phone: [phone]   Fax:  [phone]  Physical Therapy Treatment  Patient Details  Name: [name] [name] MRN: [id number] Date of Birth: 1940-12-10 Referring Provider: Meridee Score MD  Encounter Date: 11/06/2016      PT End of Session - 11/06/16 1011    Visit Number 9   Number of Visits 17   Date for PT Re-Evaluation 12/07/16   PT Start Time 0930   PT Stop Time 1025   PT Time Calculation (min) 55 min   Activity Tolerance Patient tolerated treatment well   Behavior During Therapy [facility] for tasks assessed/performed      Past Medical History:  Diagnosis Date  . Femur fracture ([place])   . Hip fracture ([place])   . Hypertension   . Malignant neoplasm of pancreas, part unspecified   . Other malignant lymphomas, unspecified site, extranodal and solid organ sites     Past Surgical History:  Procedure Laterality Date  . FEMUR SURGERY Bilateral   . LEG SURGERY Left   . SHOULDER SURGERY Left     There were no vitals filed for this visit.      Subjective Assessment - 11/06/16 0932    Subjective "Long as I don't move it its alright"   Currently in Pain? Yes   Pain Score 7    Pain Location Shoulder                         [facility] Adult PT Treatment/Exercise - 11/06/16 0001      Exercises   Exercises Knee/Hip     Knee/Hip Exercises: Aerobic   Nustep level 5 x 5 minutes     Knee/Hip Exercises: Machines for Strengthening   Total Gym Leg Press 20lb 2x15     Shoulder Exercises: Seated   Other Seated Exercises seated High to low rows 35lb 2x15      Shoulder Exercises: ROM/Strengthening   UBE (Upper Arm Bike) Level 3.5 84frd/3rev   Wall Pushups 20 reps   Other ROM/Strengthening Exercises Rows 20lb 2x10      Modalities   Modalities Electrical Stimulation     Moist Heat Therapy   Number Minutes Moist Heat 15 Minutes   Moist Heat Location  Shoulder     Electrical Stimulation   Electrical Stimulation Location left anterior shoulder   Electrical Stimulation Action IFC   Electrical Stimulation Parameters sitting    Electrical Stimulation Goals Pain     Manual Therapy   Manual Therapy Passive ROM   Passive ROM L shoulder PROM                  PT Short Term Goals - 11/01/16 0930      PT SHORT TERM GOAL #1   Title pt will be I with inital HEP )   Status Achieved           PT Long Term Goals - 11/01/16 0931      PT LONG TERM GOAL #1   Title He will be I with all HEP given as of last visit    Status Achieved     PT LONG TERM GOAL #3   Title he will increase L shoulder strength to >/= 4-/5 in all  planes for functional lifting and carrying activities required for work related activities and ADLs (09/20/2016)   Status On-going     PT LONG TERM  GOAL #4   Title pt will be able to lift and lower >/= 8# to and from an over head shelf with </= 1/10 pain for functional mobility, cooking/ cleaning, and 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 (09/20/2016)   Status On-going     PT LONG TERM GOAL #5   Title he will increase his FOTO score to </=36% limited to demonstrate improvement in function at discharge)   Status On-going     PT LONG TERM GOAL #6   Title He will increase L hip abductor/ extensor strength to >/= 4+/5 to promote walking/ standing endurance while promoting safety    Status On-going               Plan - 11/06/16 1012    Clinical Impression Statement Pt with more pain during MT than normal, Continues to have L shoulder elevation with most active movements. Pt with some elbow flaring with low to high rows.   Rehab Potential Fair   PT Frequency 2x / week   PT Duration 8 weeks   PT Treatment/Interventions ADLs/Self Care Home Management;Cryotherapy;Electrical Stimulation;Iontophoresis 4mg /ml Dexamethasone;Moist Heat;Ultrasound;Therapeutic activities;Therapeutic  exercise;Dry needling;Taping;Manual techniques;Patient/family education;Neuromuscular re-education   PT Next Visit Plan work on ROM of the shoulder, function and strength of the LE's      Patient will benefit from skilled therapeutic intervention in order to improve the following deficits and impairments:  Abnormal gait, Pain, Improper body mechanics, Postural dysfunction, Hypomobility, Decreased range of motion, Increased fascial restricitons, Decreased endurance, Decreased activity tolerance, Decreased strength  Visit Diagnosis: Chronic left shoulder pain  Stiffness of left shoulder, not elsewhere classified  Muscle weakness (generalized)  Chronic pain of left knee     Problem List Patient Active Problem List   Diagnosis Date Noted  . 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 History of arthroscopy of left shoulder 07/12/2016  . Port catheter in place 02/22/2016  . 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 Vitamin D deficiency 11/21/2012  . Lymphoma ([place]) 06/13/2012    Scot Jun, PTA 11/06/2016, 10:14 AM  [place], [place], [address] Phone: [phone]   Fax:  [phone]  Name: [name] [name] MRN: [id number] Date of Birth: 01-05-1941

## 2016-11-08 ENCOUNTER — Ambulatory Visit: Payer: Medicare HMO | Admitting: Physical Therapy

## 2016-11-08 ENCOUNTER — Encounter: Payer: Self-pay | Admitting: Physical Therapy

## 2016-11-08 DIAGNOSIS — G8929 Other chronic pain: Secondary | ICD-10-CM

## 2016-11-08 DIAGNOSIS — M6281 Muscle weakness (generalized): Secondary | ICD-10-CM

## 2016-11-08 DIAGNOSIS — M25512 Pain in left shoulder: Principal | ICD-10-CM

## 2016-11-08 DIAGNOSIS — M25612 Stiffness of left shoulder, not elsewhere classified: Secondary | ICD-10-CM

## 2016-11-08 NOTE — Therapy (Signed)
[place], [place], [address] Phone: [phone]   Fax:  [phone]  Physical Therapy Treatment  Patient Details  Name: [name] [name] MRN: [id number] Date of Birth: 30-Oct-1940 Referring Provider: Meridee Score MD  Encounter Date: 11/08/2016      PT End of Session - 11/08/16 1015    Visit Number 10   Number of Visits 17   Date for PT Re-Evaluation 12/07/16   PT Start Time 0930   PT Stop Time 1030   PT Time Calculation (min) 60 min   Activity Tolerance Patient tolerated treatment well   Behavior During Therapy [facility] for tasks assessed/performed      Past Medical History:  Diagnosis Date  . Femur fracture ([place])   . Hip fracture ([place])   . Hypertension   . Malignant neoplasm of pancreas, part unspecified   . Other malignant lymphomas, unspecified site, extranodal and solid organ sites     Past Surgical History:  Procedure Laterality Date  . FEMUR SURGERY Bilateral   . LEG SURGERY Left   . SHOULDER SURGERY Left     There were no vitals filed for this visit.      Subjective Assessment - 11/08/16 0931    Subjective "I cant tell much difference, I think my  legs are getting better"   Currently in Pain? Yes   Pain Score 3    Pain Location Shoulder                         [facility] Adult PT Treatment/Exercise - 11/08/16 0001      Knee/Hip Exercises: Aerobic   Nustep level 5 x 5 minutes   Stepper       Knee/Hip Exercises: Machines for Strengthening   Cybex Knee Extension 10lb 3x10   Cybex Knee Flexion 25lb 2x10    Total Gym Leg Press 30lb 3x15     Shoulder Exercises: Seated   Other Seated Exercises rev grip rows 35lb 2x10   Other Seated Exercises seated High to low rows 45lb 2x10     Shoulder Exercises: Standing   Other Standing Exercises 2 level cabinet reaches 1lb 2x5 flexion      Shoulder Exercises: ROM/Strengthening   UBE (Upper Arm Bike) Level 4.5 53frd/3rev      Moist Heat Therapy   Number Minutes Moist Heat 15 Minutes   Moist Heat Location Shoulder     Electrical Stimulation   Electrical Stimulation Location left anterior shoulder   Electrical Stimulation Action IFC   Electrical Stimulation Parameters sitting   Electrical Stimulation Goals Pain                  PT Short Term Goals - 11/01/16 0930      PT SHORT TERM GOAL #1   Title pt will be I with inital HEP )   Status Achieved           PT Long Term Goals - 11/01/16 0931      PT LONG TERM GOAL #1   Title He will be I with all HEP given as of last visit    Status Achieved     PT LONG TERM GOAL #3   Title he will increase L shoulder strength to >/= 4-/5 in all  planes for functional lifting and carrying activities required for work related activities and ADLs (09/20/2016)   Status On-going     PT LONG TERM  GOAL #4   Title pt will be able to lift and lower >/= 8# to and from an over head shelf with </= 1/10 pain for functional mobility, cooking/ cleaning, and 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 (09/20/2016)   Status On-going     PT LONG TERM GOAL #5   Title he will increase his FOTO score to </=36% limited to demonstrate improvement in function at discharge)   Status On-going     PT LONG TERM GOAL #6   Title He will increase L hip abductor/ extensor strength to >/= 4+/5 to promote walking/ standing endurance while promoting safety    Status On-going               Plan - 11/08/16 1019    Clinical Impression Statement Pt reports that his legs feel stronger. Attempted more LE interventions without issue. Difficulty remain performing active shoulder flexion and abduction.   Rehab Potential Fair   PT Frequency 2x / week   PT Duration 8 weeks   PT Treatment/Interventions ADLs/Self Care Home Management;Cryotherapy;Electrical Stimulation;Iontophoresis 4mg /ml Dexamethasone;Moist Heat;Ultrasound;Therapeutic activities;Therapeutic  exercise;Dry needling;Taping;Manual techniques;Patient/family education;Neuromuscular re-education   PT Next Visit Plan work on ROM of the shoulder, function and strength of the LE's, D/c next 2 visits   PT Home Exercise Plan Pt given green Tband to perform shoulder rows and extensions      Patient will benefit from skilled therapeutic intervention in order to improve the following deficits and impairments:  Abnormal gait, Pain, Improper body mechanics, Postural dysfunction, Hypomobility, Decreased range of motion, Increased fascial restricitons, Decreased endurance, Decreased activity tolerance, Decreased strength  Visit Diagnosis: Chronic left shoulder pain  Stiffness of left shoulder, not elsewhere classified  Muscle weakness (generalized)     Problem List Patient Active Problem List   Diagnosis Date Noted  . 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 History of arthroscopy of left shoulder 07/12/2016  . Port catheter in place 02/22/2016  . 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 Vitamin D deficiency 11/21/2012  . Lymphoma ([place]) 06/13/2012    Willey Blade 11/08/2016, 10:22 AM  [place] [id number] [place], [place], [address] Phone: [phone]   Fax:  [phone]  Name: [name] [name] MRN: [id number] Date of Birth: Jul 10, 1941

## 2016-11-13 ENCOUNTER — Ambulatory Visit: Payer: Medicare HMO | Admitting: Physical Therapy

## 2016-11-13 ENCOUNTER — Encounter: Payer: Self-pay | Admitting: Physical Therapy

## 2016-11-13 DIAGNOSIS — M25512 Pain in left shoulder: Secondary | ICD-10-CM | POA: Diagnosis not present

## 2016-11-13 DIAGNOSIS — G8929 Other chronic pain: Secondary | ICD-10-CM

## 2016-11-13 DIAGNOSIS — M6281 Muscle weakness (generalized): Secondary | ICD-10-CM

## 2016-11-13 DIAGNOSIS — M25612 Stiffness of left shoulder, not elsewhere classified: Secondary | ICD-10-CM

## 2016-11-13 NOTE — Therapy (Signed)
[place], [place], [address] Phone: [phone]   Fax:  [phone]  Physical Therapy Treatment  Patient Details  Name: [name] [name] MRN: [id number] Date of Birth: [birth year] Referring Provider: Meridee Score MD  Encounter Date: 11/13/2016      PT End of Session - 11/13/16 1012    Visit Number 11   Number of Visits 17   Date for PT Re-Evaluation 12/07/16   Authorization Type Medicare: Kx by 15th visit, Progress note by 10th visit   PT Start Time 0930   PT Stop Time 1027   PT Time Calculation (min) 57 min   Activity Tolerance Patient tolerated treatment well   Behavior During Therapy [facility] for tasks assessed/performed      Past Medical History:  Diagnosis Date  . Femur fracture ([place])   . Hip fracture ([place])   . Hypertension   . Malignant neoplasm of pancreas, part unspecified   . Other malignant lymphomas, unspecified site, extranodal and solid organ sites     Past Surgical History:  Procedure Laterality Date  . FEMUR SURGERY Bilateral   . LEG SURGERY Left   . SHOULDER SURGERY Left     There were no vitals filed for this visit.      Subjective Assessment - 11/13/16 0932    Subjective "Pretty much the same overall"   Currently in Pain? Yes   Pain Score 3                          [facility] Adult PT Treatment/Exercise - 11/13/16 0001      Knee/Hip Exercises: Aerobic   Nustep level 5 x 5 minutes     Knee/Hip Exercises: Machines for Strengthening   Cybex Knee Extension 10lb 3x10   Cybex Knee Flexion 25lb 2x10    Total Gym Leg Press 30lb 3x15     Shoulder Exercises: Seated   Other Seated Exercises Rows 25lb 2x10, lats 15lb 2x10     Shoulder Exercises: Standing   Other Standing Exercises 2 level cabinet reaches 2lb 2x5 flexion   assist need to reach 2nd level      Shoulder Exercises: ROM/Strengthening   UBE (Upper Arm Bike) Level 5 23frd/3rev     Moist Heat  Therapy   Number Minutes Moist Heat 15 Minutes   Moist Heat Location Shoulder     Electrical Stimulation   Electrical Stimulation Location left anterior shoulder   Electrical Stimulation Action IFC   Electrical Stimulation Parameters sitting    Electrical Stimulation Goals Pain                  PT Short Term Goals - 11/01/16 0930      PT SHORT TERM GOAL #1   Title pt will be I with inital HEP )   Status Achieved           PT Long Term Goals - 11/01/16 0931      PT LONG TERM GOAL #1   Title He will be I with all HEP given as of last visit    Status Achieved     PT LONG TERM GOAL #3   Title he will increase L shoulder strength to >/= 4-/5 in all  planes for functional lifting and carrying activities required for work related activities and ADLs (09/20/2016)   Status On-going     PT LONG TERM GOAL #4   Title pt  will be able to lift and lower >/= 8# to and from an over head shelf with </= 1/10 pain for functional mobility, cooking/ cleaning, and 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 (09/20/2016)   Status On-going     PT LONG TERM GOAL #5   Title he will increase his FOTO score to </=36% limited to demonstrate improvement in function at discharge)   Status On-going     PT LONG TERM GOAL #6   Title He will increase L hip abductor/ extensor strength to >/= 4+/5 to promote walking/ standing endurance while promoting safety    Status On-going               Plan - 11/13/16 1013    Clinical Impression Statement Pt able to complete all of today's exercises. Pt does show increase strength, with Rows and pull downs. Pt L shoulder does remain limited.   Rehab Potential Fair   PT Frequency 2x / week   PT Duration 8 weeks   PT Treatment/Interventions ADLs/Self Care Home Management;Cryotherapy;Electrical Stimulation;Iontophoresis 4mg /ml Dexamethasone;Moist Heat;Ultrasound;Therapeutic activities;Therapeutic exercise;Dry  needling;Taping;Manual techniques;Patient/family education;Neuromuscular re-education   PT Next Visit Plan work on ROM of the shoulder, function and strength of the LE's, Ind gym program.      Patient will benefit from skilled therapeutic intervention in order to improve the following deficits and impairments:  Abnormal gait, Pain, Improper body mechanics, Postural dysfunction, Hypomobility, Decreased range of motion, Increased fascial restricitons, Decreased endurance, Decreased activity tolerance, Decreased strength  Visit Diagnosis: Chronic left shoulder pain  Muscle weakness (generalized)  Stiffness of left shoulder, not elsewhere classified     Problem List Patient Active Problem List   Diagnosis Date Noted  . 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 History of arthroscopy of left shoulder 07/12/2016  . Port catheter in place 02/22/2016  . 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 Vitamin D deficiency 11/21/2012  . Lymphoma ([place]) 06/13/2012    Scot Jun, PTA 11/13/2016, 10:15 AM  [place] [address] [place], [place], [address] Phone: [phone]   Fax:  [phone]  Name: [name] [name] MRN: [id number] Date of Birth: [birth year]

## 2016-11-15 ENCOUNTER — Ambulatory Visit: Payer: Medicare HMO | Admitting: Physical Therapy

## 2016-11-15 ENCOUNTER — Encounter: Payer: Self-pay | Admitting: Physical Therapy

## 2016-11-15 DIAGNOSIS — M25562 Pain in left knee: Secondary | ICD-10-CM

## 2016-11-15 DIAGNOSIS — M25612 Stiffness of left shoulder, not elsewhere classified: Secondary | ICD-10-CM

## 2016-11-15 DIAGNOSIS — M25512 Pain in left shoulder: Principal | ICD-10-CM

## 2016-11-15 DIAGNOSIS — M6281 Muscle weakness (generalized): Secondary | ICD-10-CM

## 2016-11-15 DIAGNOSIS — G8929 Other chronic pain: Secondary | ICD-10-CM

## 2016-11-15 NOTE — Therapy (Signed)
[place], [place], [address] Phone: [phone]   Fax:  [phone]  Physical Therapy Treatment  Patient Details  Name: [name] [name] MRN: [id number] Date of Birth: January 01, 1941 Referring Provider: Meridee Score MD  Encounter Date: 11/15/2016      PT End of Session - 11/15/16 1016    Visit Number 12   Date for PT Re-Evaluation 12/07/16   PT Start Time 0930   PT Stop Time 1029   PT Time Calculation (min) 59 min   Activity Tolerance Patient tolerated treatment well   Behavior During Therapy [facility] for tasks assessed/performed      Past Medical History:  Diagnosis Date  . Femur fracture ([place])   . Hip fracture ([place])   . Hypertension   . Malignant neoplasm of pancreas, part unspecified   . Other malignant lymphomas, unspecified site, extranodal and solid organ sites     Past Surgical History:  Procedure Laterality Date  . FEMUR SURGERY Bilateral   . LEG SURGERY Left   . SHOULDER SURGERY Left     There were no vitals filed for this visit.      Subjective Assessment - 11/15/16 0932    Subjective "I worked my legs at home last night"   Currently in Pain? No/denies   Pain Score 0-No pain            [facility] PT Assessment - 11/15/16 0001      AROM   Left Shoulder Flexion 86 Degrees   Left Shoulder ABduction 80 Degrees                     [facility] Adult PT Treatment/Exercise - 11/15/16 0001      Knee/Hip Exercises: Aerobic   Nustep level 5 x 5 minutes     Knee/Hip Exercises: Machines for Strengthening   Total Gym Leg Press 30lb 3x15     Shoulder Exercises: Seated   Other Seated Exercises Rows 25lb 2x15, lats 15lb 2x10     Shoulder Exercises: Standing   Other Standing Exercises Straight arm pull downs 35lb 2x15     Shoulder Exercises: ROM/Strengthening   UBE (Upper Arm Bike) Level 6 24frd/3rev   Other ROM/Strengthening Exercises bicep curls 15lb 2x10      Moist  Heat Therapy   Number Minutes Moist Heat 15 Minutes   Moist Heat Location Shoulder     Electrical Stimulation   Electrical Stimulation Location left anterior shoulder   Electrical Stimulation Action IFC   Electrical Stimulation Parameters sitting   Electrical Stimulation Goals Pain                  PT Short Term Goals - 11/01/16 0930      PT SHORT TERM GOAL #1   Title pt will be I with inital HEP )   Status Achieved           PT Long Term Goals - 11/01/16 0931      PT LONG TERM GOAL #1   Title He will be I with all HEP given as of last visit    Status Achieved     PT LONG TERM GOAL #3   Title he will increase L shoulder strength to >/= 4-/5 in all  planes for functional lifting and carrying activities required for work related activities and ADLs (09/20/2016)   Status On-going     PT LONG TERM GOAL #4   Title pt  will be able to lift and lower >/= 8# to and from an over head shelf with </= 1/10 pain for functional mobility, cooking/ cleaning, and 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 (09/20/2016)   Status On-going     PT LONG TERM GOAL #5   Title he will increase his FOTO score to </=36% limited to demonstrate improvement in function at discharge)   Status On-going     PT LONG TERM GOAL #6   Title He will increase L hip abductor/ extensor strength to >/= 4+/5 to promote walking/ standing endurance while promoting safety    Status On-going               Plan - 11/15/16 1017    Clinical Impression Statement Pt appears to reach his a plateau with his L shoulder ROM. Does demonstrate good strength with seated rows and lat pull downs.     Rehab Potential Fair   PT Frequency 2x / week   PT Duration 8 weeks   PT Treatment/Interventions ADLs/Self Care Home Management;Cryotherapy;Electrical Stimulation;Iontophoresis 4mg /ml Dexamethasone;Moist Heat;Ultrasound;Therapeutic activities;Therapeutic exercise;Dry needling;Taping;Manual  techniques;Patient/family education;Neuromuscular re-education   PT Next Visit Plan work on ROM of the shoulder, function and strength of the LE's, Ind gym program.      Patient will benefit from skilled therapeutic intervention in order to improve the following deficits and impairments:  Abnormal gait, Pain, Improper body mechanics, Postural dysfunction, Hypomobility, Decreased range of motion, Increased fascial restricitons, Decreased endurance, Decreased activity tolerance, Decreased strength  Visit Diagnosis: Chronic left shoulder pain  Muscle weakness (generalized)  Stiffness of left shoulder, not elsewhere classified  Chronic pain of left knee     Problem List Patient Active Problem List   Diagnosis Date Noted  . 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 History of arthroscopy of left shoulder 07/12/2016  . Port catheter in place 02/22/2016  . 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 Vitamin D deficiency 11/21/2012  . Lymphoma ([place]) 06/13/2012    Scot Jun, PTA 11/15/2016, 10:20 AM  [place] [address] [place], [place], [address] Phone: [phone]   Fax:  [phone]  Name: [name] [name] MRN: [id number] Date of Birth: [birth year]

## 2016-11-20 ENCOUNTER — Ambulatory Visit: Payer: Medicare HMO | Attending: Orthopedic Surgery | Admitting: Physical Therapy

## 2016-11-20 ENCOUNTER — Encounter: Payer: Self-pay | Admitting: Physical Therapy

## 2016-11-20 DIAGNOSIS — M25512 Pain in left shoulder: Secondary | ICD-10-CM | POA: Diagnosis present

## 2016-11-20 DIAGNOSIS — M25562 Pain in left knee: Secondary | ICD-10-CM | POA: Diagnosis present

## 2016-11-20 DIAGNOSIS — M25612 Stiffness of left shoulder, not elsewhere classified: Secondary | ICD-10-CM | POA: Insufficient documentation

## 2016-11-20 DIAGNOSIS — M6281 Muscle weakness (generalized): Secondary | ICD-10-CM | POA: Diagnosis present

## 2016-11-20 DIAGNOSIS — G8929 Other chronic pain: Secondary | ICD-10-CM | POA: Diagnosis present

## 2016-11-20 DIAGNOSIS — R293 Abnormal posture: Secondary | ICD-10-CM | POA: Insufficient documentation

## 2016-11-20 NOTE — Therapy (Signed)
[name] [name], [place], [address] Phone: [phone]   Fax:  [phone]  Physical Therapy Treatment  Patient Details  Name: [name] [name] MRN: [id number] Date of Birth: Feb 08, 1941 Referring Provider: Meridee Score MD  Encounter Date: 11/20/2016      PT End of Session - 11/20/16 1058    Visit Number --      Past Medical History:  Diagnosis Date  . Femur fracture ([place])   . Hip fracture ([place])   . Hypertension   . Malignant neoplasm of pancreas, part unspecified   . Other malignant lymphomas, unspecified site, extranodal and solid organ sites     Past Surgical History:  Procedure Laterality Date  . FEMUR SURGERY Bilateral   . LEG SURGERY Left   . SHOULDER SURGERY Left     There were no vitals filed for this visit.      Subjective Assessment - 11/20/16 1027    Subjective "I was able to wash my back this morning with less pain"   Currently in Pain? No/denies   Pain Score 0-No pain                         [facility] Adult PT Treatment/Exercise - 11/20/16 0001      Knee/Hip Exercises: Machines for Strengthening   Cybex Knee Extension 15lb 3x10   Cybex Knee Flexion 25lb 2x10    Total Gym Leg Press 40lb 2x15     Shoulder Exercises: Seated   Other Seated Exercises Rows 25lb 2x15, lats 15lb 2x10     Shoulder Exercises: ROM/Strengthening   UBE (Upper Arm Bike) Level 6 24frd/3rev     Moist Heat Therapy   Number Minutes Moist Heat 15 Minutes   Moist Heat Location Shoulder     Electrical Stimulation   Electrical Stimulation Location left anterior shoulder   Electrical Stimulation Action IFC   Electrical Stimulation Parameters sitting    Electrical Stimulation Goals Pain                  PT Short Term Goals - 11/01/16 0930      PT SHORT TERM GOAL #1   Title pt will be I with inital HEP )   Status Achieved           PT Long Term Goals - 11/01/16 0931       PT LONG TERM GOAL #1   Title He will be I with all HEP given as of last visit    Status Achieved     PT LONG TERM GOAL #3   Title he will increase L shoulder strength to >/= 4-/5 in all  planes for functional lifting and carrying activities required for work related activities and ADLs (09/20/2016)   Status On-going     PT LONG TERM GOAL #4   Title pt will be able to lift and lower >/= 8# to and from an over head shelf with </= 1/10 pain for functional mobility, cooking/ cleaning, and 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 (09/20/2016)   Status On-going     PT LONG TERM GOAL #5   Title he will increase his FOTO score to </=36% limited to demonstrate improvement in function at discharge)   Status On-going     PT LONG TERM GOAL #6   Title He will increase L hip abductor/ extensor strength to >/= 4+/5 to promote walking/  standing endurance while promoting safety    Status On-going               Plan - 11/20/16 1059    Clinical Impression Statement Pt ~ 10 minutes late for today's treatment. Pt reports less pian when washing his back this morning. Able to complete all exercises, increase pain with shoulder exercises.   Rehab Potential Fair   PT Frequency 2x / week   PT Duration 8 weeks   PT Treatment/Interventions ADLs/Self Care Home Management;Cryotherapy;Electrical Stimulation;Iontophoresis 4mg /ml Dexamethasone;Moist Heat;Ultrasound;Therapeutic activities;Therapeutic exercise;Dry needling;Taping;Manual techniques;Patient/family education;Neuromuscular re-education   PT Next Visit Plan D/C next visit      Patient will benefit from skilled therapeutic intervention in order to improve the following deficits and impairments:  Abnormal gait, Pain, Improper body mechanics, Postural dysfunction, Hypomobility, Decreased range of motion, Increased fascial restricitons, Decreased endurance, Decreased activity tolerance, Decreased strength  Visit  Diagnosis: Chronic left shoulder pain  Muscle weakness (generalized)  Chronic pain of left knee  Stiffness of left shoulder, not elsewhere classified     Problem List Patient Active Problem List   Diagnosis Date Noted  . 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 History of arthroscopy of left shoulder 07/12/2016  . Port catheter in place 02/22/2016  . 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 Vitamin D deficiency 11/21/2012  . Lymphoma ([place]) 06/13/2012    Scot Jun, PTA 11/20/2016, 11:05 AM  [address] [place], [place], [address] Phone: [phone]   Fax:  [phone]  Name: [name] [name] MRN: [id number] Date of Birth: 10-20-1940

## 2016-11-22 ENCOUNTER — Encounter: Payer: Medicare HMO | Admitting: Physical Therapy

## 2016-11-23 ENCOUNTER — Encounter: Payer: Self-pay | Admitting: Physical Therapy

## 2016-11-23 ENCOUNTER — Ambulatory Visit: Payer: Medicare HMO | Admitting: Physical Therapy

## 2016-11-23 DIAGNOSIS — M6281 Muscle weakness (generalized): Secondary | ICD-10-CM

## 2016-11-23 DIAGNOSIS — G8929 Other chronic pain: Secondary | ICD-10-CM

## 2016-11-23 DIAGNOSIS — M25612 Stiffness of left shoulder, not elsewhere classified: Secondary | ICD-10-CM

## 2016-11-23 DIAGNOSIS — M25562 Pain in left knee: Secondary | ICD-10-CM

## 2016-11-23 DIAGNOSIS — M25512 Pain in left shoulder: Principal | ICD-10-CM

## 2016-11-23 DIAGNOSIS — R293 Abnormal posture: Secondary | ICD-10-CM

## 2016-11-23 NOTE — Therapy (Signed)
[place], [place], [address] Phone: [phone]   Fax:  [phone]  Physical Therapy Treatment  Patient Details  Name: [name] [name] MRN: [id number] Date of Birth: [birth year] Referring Provider: Meridee Score MD  Encounter Date: 11/23/2016      PT End of Session - 11/23/16 1058    Visit Number 15   Number of Visits 17   Date for PT Re-Evaluation 12/07/16   Authorization Type Medicare: Kx by 15th visit, Progress note by 10th visit   PT Start Time 1015   PT Stop Time 1112   PT Time Calculation (min) 57 min   Activity Tolerance Patient tolerated treatment well   Behavior During Therapy [facility] for tasks assessed/performed      Past Medical History:  Diagnosis Date  . Femur fracture ([place])   . Hip fracture ([place])   . Hypertension   . Malignant neoplasm of pancreas, part unspecified   . Other malignant lymphomas, unspecified site, extranodal and solid organ sites     Past Surgical History:  Procedure Laterality Date  . FEMUR SURGERY Bilateral   . LEG SURGERY Left   . SHOULDER SURGERY Left     There were no vitals filed for this visit.      Subjective Assessment - 11/23/16 1016    Subjective "Doing pretty good"   Currently in Pain? No/denies   Pain Score 0-No pain                         [facility] Adult PT Treatment/Exercise - 11/23/16 0001      Knee/Hip Exercises: Aerobic   Nustep level 5 x 5 minutes     Knee/Hip Exercises: Machines for Strengthening   Cybex Knee Extension 15lb 2x15   Cybex Knee Flexion 25lb 2x15   Total Gym Leg Press 40lb 2x15     Shoulder Exercises: Seated   Other Seated Exercises Rows 25lb 2x15, lats 15lb 2x10   Other Seated Exercises chest press 10lb 2x15     Shoulder Exercises: ROM/Strengthening   UBE (Upper Arm Bike) Level 6 3fd/3rev     Moist Heat Therapy   Number Minutes Moist Heat 15 Minutes   Moist Heat Location Shoulder     Electrical Stimulation   Electrical Stimulation Location left anterior shoulder   Electrical Stimulation Action IFC   Electrical Stimulation Parameters sitting   Electrical Stimulation Goals Pain                  PT Short Term Goals - 11/01/16 0930      PT SHORT TERM GOAL #1   Title pt will be I with inital HEP )   Status Achieved           PT Long Term Goals - 11/01/16 0931      PT LONG TERM GOAL #1   Title He will be I with all HEP given as of last visit    Status Achieved     PT LONG TERM GOAL #3   Title he will increase L shoulder strength to >/= 4-/5 in all  planes for functional lifting and carrying activities required for work related activities and ADLs (09/20/2016)   Status On-going     PT LONG TERM GOAL #4   Title pt will be able to lift and lower >/= 8# to and from an over head shelf with </= 1/10 pain for functional mobility, cooking/  cleaning, and 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 (09/20/2016)   Status On-going     PT LONG TERM GOAL #5   Title he will increase his FOTO score to </=36% limited to demonstrate improvement in function at discharge)   Status On-going     PT LONG TERM GOAL #6   Title He will increase L hip abductor/ extensor strength to >/= 4+/5 to promote walking/ standing endurance while promoting safety    Status On-going               Plan - 11/23/16 1059    Clinical Impression Statement Pt able to complete all of todays interventions. Limited motion with LUE.   Rehab Potential Fair   PT Frequency 2x / week   PT Duration 8 weeks   PT Treatment/Interventions ADLs/Self Care Home Management;Cryotherapy;Electrical Stimulation;Iontophoresis 70m/ml Dexamethasone;Moist Heat;Ultrasound;Therapeutic activities;Therapeutic exercise;Dry needling;Taping;Manual techniques;Patient/family education;Neuromuscular re-education   PT Next Visit Plan D/C       Patient will benefit from skilled therapeutic  intervention in order to improve the following deficits and impairments:  Abnormal gait, Pain, Improper body mechanics, Postural dysfunction, Hypomobility, Decreased range of motion, Increased fascial restricitons, Decreased endurance, Decreased activity tolerance, Decreased strength  Visit Diagnosis: Chronic left shoulder pain  Muscle weakness (generalized)  Stiffness of left shoulder, not elsewhere classified  Abnormal posture  Chronic pain of left knee     Problem List Patient Active Problem List   Diagnosis Date Noted  . 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 History of arthroscopy of left shoulder 07/12/2016  . Port catheter in place 02/22/2016  . 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 12/28[place]  . Vitamin D deficiency 11/21/2012  . [place] 06/13/2012   PHYSICAL THERAPY DISCHARGE SUMMARY  Visits from Start of Care: 15  Plan: Patient agrees to discharge.  Patient goals were not met. Patient is being discharged due to lack of progress.  ?????      RScot Jun PTA 11/23/2016, 11:00 AM  [place] [address]Phone: [phone]  Fax:  [phone] Name: WSALIM FOREROMRN: [id number] of Birth: 628-Oct-1942

## 2016-11-27 ENCOUNTER — Ambulatory Visit: Payer: Medicare HMO | Admitting: Physical Therapy

## 2016-11-28 ENCOUNTER — Telehealth: Payer: Self-pay | Admitting: Internal Medicine

## 2016-11-28 NOTE — Telephone Encounter (Signed)
Patient called to r/s [place] from 11/28/2016 to 11/29/2016 . Patient is aware of new date and time.

## 2016-11-29 ENCOUNTER — Ambulatory Visit (HOSPITAL_BASED_OUTPATIENT_CLINIC_OR_DEPARTMENT_OTHER): Payer: Medicare HMO

## 2016-11-29 ENCOUNTER — Ambulatory Visit: Payer: Medicare HMO | Admitting: Physical Therapy

## 2016-11-29 DIAGNOSIS — Z452 Encounter for adjustment and management of vascular access device: Secondary | ICD-10-CM

## 2016-11-29 DIAGNOSIS — C8330 Diffuse large B-cell lymphoma, unspecified site: Secondary | ICD-10-CM | POA: Diagnosis not present

## 2016-11-29 DIAGNOSIS — Z95828 Presence of other vascular implants and grafts: Secondary | ICD-10-CM

## 2016-11-29 DIAGNOSIS — C8251 Diffuse follicle center lymphoma, lymph nodes of head, face, and neck: Secondary | ICD-10-CM

## 2016-11-29 MED ORDER — SODIUM CHLORIDE 0.9 % IJ SOLN
10.0000 mL | INTRAMUSCULAR | Status: DC | PRN
  Administered 2016-11-29: 10 mL via INTRAVENOUS
  Filled 2016-11-29: qty 10

## 2016-11-29 MED ORDER — HEPARIN SOD (PORK) LOCK FLUSH 100 UNIT/ML IV SOLN
500.0000 [IU] | Freq: Once | INTRAVENOUS | Status: AC | PRN
  Administered 2016-11-29: 500 [IU] via INTRAVENOUS
  Filled 2016-11-29: qty 5

## 2016-12-06 DIAGNOSIS — H34813 Central retinal vein occlusion, bilateral, with macular edema: Secondary | ICD-10-CM | POA: Diagnosis not present

## 2016-12-06 DIAGNOSIS — H43813 Vitreous degeneration, bilateral: Secondary | ICD-10-CM | POA: Diagnosis not present

## 2016-12-06 DIAGNOSIS — H35372 Puckering of macula, left eye: Secondary | ICD-10-CM | POA: Diagnosis not present

## 2017-01-23 ENCOUNTER — Ambulatory Visit (HOSPITAL_BASED_OUTPATIENT_CLINIC_OR_DEPARTMENT_OTHER): Payer: Medicare HMO

## 2017-01-23 ENCOUNTER — Other Ambulatory Visit: Payer: Self-pay | Admitting: Medical Oncology

## 2017-01-23 ENCOUNTER — Telehealth: Payer: Self-pay | Admitting: Internal Medicine

## 2017-01-23 ENCOUNTER — Encounter: Payer: Self-pay | Admitting: Internal Medicine

## 2017-01-23 ENCOUNTER — Other Ambulatory Visit (HOSPITAL_BASED_OUTPATIENT_CLINIC_OR_DEPARTMENT_OTHER): Payer: Medicare HMO

## 2017-01-23 ENCOUNTER — Ambulatory Visit (HOSPITAL_BASED_OUTPATIENT_CLINIC_OR_DEPARTMENT_OTHER): Payer: Medicare HMO | Admitting: Internal Medicine

## 2017-01-23 VITALS — BP 139/92 | HR 85 | Temp 97.5°F | Resp 20 | Ht 69.0 in | Wt 184.7 lb

## 2017-01-23 DIAGNOSIS — I10 Essential (primary) hypertension: Secondary | ICD-10-CM | POA: Diagnosis not present

## 2017-01-23 DIAGNOSIS — Z95828 Presence of other vascular implants and grafts: Secondary | ICD-10-CM

## 2017-01-23 DIAGNOSIS — C8251 Diffuse follicle center lymphoma, lymph nodes of head, face, and neck: Secondary | ICD-10-CM

## 2017-01-23 DIAGNOSIS — E559 Vitamin D deficiency, unspecified: Secondary | ICD-10-CM

## 2017-01-23 DIAGNOSIS — C8330 Diffuse large B-cell lymphoma, unspecified site: Secondary | ICD-10-CM

## 2017-01-23 LAB — CBC WITH DIFFERENTIAL/PLATELET
BASO%: 1 % (ref 0.0–2.0)
Basophils Absolute: 0.1 10*3/uL (ref 0.0–0.1)
EOS ABS: 0.1 10*3/uL (ref 0.0–0.5)
EOS%: 1.8 % (ref 0.0–7.0)
HCT: 45.3 % (ref 38.4–49.9)
HGB: 14.8 g/dL (ref 13.0–17.1)
LYMPH%: 21 % (ref 14.0–49.0)
MCH: 28.4 pg (ref 27.2–33.4)
MCHC: 32.7 g/dL (ref 32.0–36.0)
MCV: 87.1 fL (ref 79.3–98.0)
MONO#: 0.4 10*3/uL (ref 0.1–0.9)
MONO%: 6.2 % (ref 0.0–14.0)
NEUT%: 70 % (ref 39.0–75.0)
NEUTROS ABS: 4.5 10*3/uL (ref 1.5–6.5)
Platelets: 143 10*3/uL (ref 140–400)
RBC: 5.2 10*6/uL (ref 4.20–5.82)
RDW: 14.5 % (ref 11.0–14.6)
WBC: 6.5 10*3/uL (ref 4.0–10.3)
lymph#: 1.4 10*3/uL (ref 0.9–3.3)

## 2017-01-23 LAB — COMPREHENSIVE METABOLIC PANEL
ALK PHOS: 61 U/L (ref 40–150)
ALT: 21 U/L (ref 0–55)
AST: 20 U/L (ref 5–34)
Albumin: 4.3 g/dL (ref 3.5–5.0)
Anion Gap: 9 mEq/L (ref 3–11)
BUN: 17 mg/dL (ref 7.0–26.0)
CO2: 28 mEq/L (ref 22–29)
Calcium: 10.2 mg/dL (ref 8.4–10.4)
Chloride: 103 mEq/L (ref 98–109)
Creatinine: 1.3 mg/dL (ref 0.7–1.3)
EGFR: 52 mL/min/{1.73_m2} — AB (ref >90)
Glucose: 155 mg/dl — ABNORMAL HIGH (ref 70–140)
POTASSIUM: 4 meq/L (ref 3.5–5.1)
SODIUM: 140 meq/L (ref 136–145)
TOTAL PROTEIN: 7.2 g/dL (ref 6.4–8.3)
Total Bilirubin: 1.14 mg/dL (ref 0.20–1.20)

## 2017-01-23 LAB — LACTATE DEHYDROGENASE: LDH: 199 U/L (ref 125–245)

## 2017-01-23 MED ORDER — SODIUM CHLORIDE 0.9 % IJ SOLN
10.0000 mL | INTRAMUSCULAR | Status: DC | PRN
  Administered 2017-01-23: 10 mL via INTRAVENOUS
  Filled 2017-01-23: qty 10

## 2017-01-23 MED ORDER — HEPARIN SOD (PORK) LOCK FLUSH 100 UNIT/ML IV SOLN
500.0000 [IU] | Freq: Once | INTRAVENOUS | Status: AC | PRN
  Administered 2017-01-23: 500 [IU] via INTRAVENOUS
  Filled 2017-01-23: qty 5

## 2017-01-23 NOTE — Telephone Encounter (Signed)
Appointments scheduled per 01/23/17 LOS. Patient given AVS report and calendars with future scheduled appointments.  °

## 2017-01-23 NOTE — Progress Notes (Signed)
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  CURRENT THERAPY: Observation.   INTERVAL HISTORY: [name] [name] 76 y.o. male returns to the clinic today for follow-up visit. The patient is doing fine today with no specific complaints. He denied having any weight loss or night sweats. He denied having any palpable lymphadenopathy. He has no chest pain, shortness of breath, cough or hemoptysis. He has no fever or chills. He denied having any nausea, vomiting, diarrhea or constipation. He is here today for evaluation with repeat blood work.   MEDICAL HISTORY: Past Medical History:  Diagnosis Date  . Femur fracture ([place])   . Hip fracture ([place])   . Hypertension   . Malignant neoplasm of pancreas, part unspecified   . Other malignant lymphomas, unspecified site, extranodal and solid organ sites     ALLERGIES:  is allergic to iohexol and sulfa antibiotics.  MEDICATIONS:  Current Outpatient Prescriptions  Medication Sig Dispense Refill  . Aflibercept (EYLEA) 2 MG/0.05ML SOLN Inject into the eye as directed. Take every 6 wks in eye doctor office.    . Ascorbic Acid (VITAMIN C) 1000 MG tablet Take 2,000 mg by mouth daily.      . Calcium-Magnesium (CALMAG THINS PO) Take by mouth.    . cholecalciferol (VITAMIN D) 1000 UNITS tablet Take 15,000 Units by mouth daily.     Marland Kitchen lisinopril-hydrochlorothiazide (PRINZIDE,ZESTORETIC) 20-12.5 MG per tablet Take 2 tablets by mouth daily.     . Multiple Vitamin (MULTIVITAMIN) tablet  Take 6 tablets by mouth daily.      Marland Kitchen neomycin-polymyxin b-dexamethasone (MAXITROL) 3.5-10000-0.1 OINT     . Probiotic Product (PROBIOTIC FORMULA PO) Take 1 tablet by mouth daily.      Marland Kitchen UNABLE TO FIND Take 1 capsule by mouth daily. Med Name:TUMERIC    . Zinc 30 MG TABS Take 1 tablet by mouth every morning.     No current facility-administered medications for this visit.    Facility-Administered Medications Ordered in Other Visits  Medication Dose Route Frequency Provider Last Rate Last Dose  . sodium chloride 0.9 % injection 10 mL  10 mL Intravenous PRN Curt Bears, MD   10 mL at 12/29/14 1026  . sodium chloride 0.9 % injection 10 mL  10 mL Intravenous PRN Curt Bears, MD   10 mL at 01/23/17 1012    SURGICAL HISTORY:  Past Surgical History:  Procedure Laterality Date  . FEMUR SURGERY Bilateral   . LEG SURGERY Left   . SHOULDER SURGERY Left     REVIEW OF SYSTEMS:  A comprehensive review of systems was negative.   PHYSICAL EXAMINATION: General appearance: alert, cooperative and no distress Head: Normocephalic, without obvious abnormality, atraumatic Neck: no adenopathy, no JVD, supple, symmetrical, trachea midline and thyroid not enlarged, symmetric, no tenderness/mass/nodules Lymph nodes: Cervical, supraclavicular, and axillary nodes normal. Resp: clear to auscultation bilaterally Back: symmetric, no curvature. ROM normal. No CVA tenderness. Cardio: regular rate and rhythm, S1, S2 normal, no murmur, click, rub or gallop GI: soft, non-tender; bowel sounds normal; no masses,  no organomegaly Extremities:  extremities normal, atraumatic, no cyanosis or edema  ECOG PERFORMANCE STATUS: 0 - Asymptomatic  Blood pressure (!) 139/92, pulse 85, temperature 97.5 F (36.4 C), temperature source Oral, resp. rate 20, height 5\' 9"  (1.753 m), weight 184 lb 11.2 oz (83.8 kg), SpO2 97 %.  LABORATORY DATA: Lab Results  Component Value Date   WBC 6.5 01/23/2017   HGB 14.8 01/23/2017     HCT 45.3 01/23/2017   MCV 87.1 01/23/2017   PLT 143 01/23/2017      Chemistry      Component Value Date/Time   NA 140 01/23/2017 0958   K 4.0 01/23/2017 0958   CL 103 11/21/2012 0930   CO2 28 01/23/2017 0958   BUN 17.0 01/23/2017 0958   CREATININE 1.3 01/23/2017 0958      Component Value Date/Time   CALCIUM 10.2 01/23/2017 0958   ALKPHOS 61 01/23/2017 0958   AST 20 01/23/2017 0958   ALT 21 01/23/2017 0958   BILITOT 1.14 01/23/2017 0958       RADIOGRAPHIC STUDIES: No results found.  ASSESSMENT AND PLAN:  This is a very pleasant 76 years old white male with history of large B-cell non-Hodgkin lymphoma presented with a large mass close to the head of the pancreas status post systemic chemotherapy with CHOP/Rituxan for 4 cycles in addition to hyperthermic therapy in [place] with almost complete response of his disease. The patient has been on observation now for several years and he has no concerning symptoms for disease recurrence. His lab work today is unremarkable. I recommended for the patient to continue on observation with repeat CBC, comprehensive metabolic panel and LDH in one year. He was advised to call immediately if he has any concerning symptoms in the interval. The patient voices understanding of current disease status and treatment options and is in agreement with the current care plan. All questions were answered. The patient knows to call the clinic with any problems, questions or concerns. We can certainly see the patient much sooner if necessary. I spent 10 minutes counseling the patient face to face. The total time spent in the appointment was 15 minutes.  Disclaimer: This note was dictated with voice recognition software. Similar sounding words can inadvertently be transcribed and may not be corrected upon review.

## 2017-01-24 LAB — VITAMIN D 25 HYDROXY (VIT D DEFICIENCY, FRACTURES): Vitamin D, 25-Hydroxy: 102 ng/mL — ABNORMAL HIGH (ref 30.0–100.0)

## 2017-02-20 DIAGNOSIS — H34813 Central retinal vein occlusion, bilateral, with macular edema: Secondary | ICD-10-CM | POA: Diagnosis not present

## 2017-02-20 DIAGNOSIS — H43813 Vitreous degeneration, bilateral: Secondary | ICD-10-CM | POA: Diagnosis not present

## 2017-02-20 DIAGNOSIS — H43391 Other vitreous opacities, right eye: Secondary | ICD-10-CM | POA: Diagnosis not present

## 2017-02-20 DIAGNOSIS — H35372 Puckering of macula, left eye: Secondary | ICD-10-CM | POA: Diagnosis not present

## 2017-03-28 DIAGNOSIS — I10 Essential (primary) hypertension: Secondary | ICD-10-CM | POA: Diagnosis not present

## 2017-03-28 DIAGNOSIS — H348132 Central retinal vein occlusion, bilateral, stable: Secondary | ICD-10-CM | POA: Diagnosis not present

## 2017-03-28 DIAGNOSIS — E559 Vitamin D deficiency, unspecified: Secondary | ICD-10-CM | POA: Diagnosis not present

## 2017-03-28 DIAGNOSIS — Z8572 Personal history of non-Hodgkin lymphomas: Secondary | ICD-10-CM | POA: Diagnosis not present

## 2017-03-28 DIAGNOSIS — Z0000 Encounter for general adult medical examination without abnormal findings: Secondary | ICD-10-CM | POA: Diagnosis not present

## 2017-03-28 DIAGNOSIS — D696 Thrombocytopenia, unspecified: Secondary | ICD-10-CM | POA: Diagnosis not present

## 2017-03-28 DIAGNOSIS — Z1389 Encounter for screening for other disorder: Secondary | ICD-10-CM | POA: Diagnosis not present

## 2017-05-22 DIAGNOSIS — H34813 Central retinal vein occlusion, bilateral, with macular edema: Secondary | ICD-10-CM | POA: Diagnosis not present

## 2017-05-22 DIAGNOSIS — H3582 Retinal ischemia: Secondary | ICD-10-CM | POA: Diagnosis not present

## 2017-05-22 DIAGNOSIS — H35372 Puckering of macula, left eye: Secondary | ICD-10-CM | POA: Diagnosis not present

## 2017-05-22 DIAGNOSIS — H43813 Vitreous degeneration, bilateral: Secondary | ICD-10-CM | POA: Diagnosis not present

## 2017-06-22 DIAGNOSIS — I10 Essential (primary) hypertension: Secondary | ICD-10-CM | POA: Diagnosis not present

## 2017-06-22 DIAGNOSIS — R42 Dizziness and giddiness: Secondary | ICD-10-CM | POA: Diagnosis not present

## 2017-07-03 DIAGNOSIS — H35372 Puckering of macula, left eye: Secondary | ICD-10-CM | POA: Diagnosis not present

## 2017-07-03 DIAGNOSIS — H34813 Central retinal vein occlusion, bilateral, with macular edema: Secondary | ICD-10-CM | POA: Diagnosis not present

## 2017-08-21 DIAGNOSIS — H3582 Retinal ischemia: Secondary | ICD-10-CM | POA: Diagnosis not present

## 2017-08-21 DIAGNOSIS — H43391 Other vitreous opacities, right eye: Secondary | ICD-10-CM | POA: Diagnosis not present

## 2017-08-21 DIAGNOSIS — H35372 Puckering of macula, left eye: Secondary | ICD-10-CM | POA: Diagnosis not present

## 2017-08-21 DIAGNOSIS — H34813 Central retinal vein occlusion, bilateral, with macular edema: Secondary | ICD-10-CM | POA: Diagnosis not present

## 2017-10-03 DIAGNOSIS — H34813 Central retinal vein occlusion, bilateral, with macular edema: Secondary | ICD-10-CM | POA: Diagnosis not present

## 2017-11-14 DIAGNOSIS — H43813 Vitreous degeneration, bilateral: Secondary | ICD-10-CM | POA: Diagnosis not present

## 2017-11-14 DIAGNOSIS — H35372 Puckering of macula, left eye: Secondary | ICD-10-CM | POA: Diagnosis not present

## 2017-11-14 DIAGNOSIS — H3582 Retinal ischemia: Secondary | ICD-10-CM | POA: Diagnosis not present

## 2017-11-14 DIAGNOSIS — H34813 Central retinal vein occlusion, bilateral, with macular edema: Secondary | ICD-10-CM | POA: Diagnosis not present

## 2017-11-20 ENCOUNTER — Telehealth: Payer: Self-pay | Admitting: Internal Medicine

## 2017-11-20 NOTE — Telephone Encounter (Signed)
Patient called in to schedule [name] [name] said is was ok

## 2017-11-22 ENCOUNTER — Inpatient Hospital Stay: Payer: Medicare HMO

## 2017-11-22 MED ORDER — SODIUM CHLORIDE 0.9 % IJ SOLN
10.0000 mL | INTRAMUSCULAR | Status: AC | PRN
  Filled 2017-11-22: qty 10

## 2017-11-22 MED ORDER — HEPARIN SOD (PORK) LOCK FLUSH 100 UNIT/ML IV SOLN
500.0000 [IU] | Freq: Once | INTRAVENOUS | Status: AC | PRN
  Filled 2017-11-22: qty 5

## 2017-11-29 ENCOUNTER — Telehealth: Payer: Self-pay | Admitting: Internal Medicine

## 2017-11-29 NOTE — Telephone Encounter (Signed)
Patient called in to reschedule appointment missed

## 2017-11-30 ENCOUNTER — Inpatient Hospital Stay: Payer: Medicare HMO | Attending: Internal Medicine

## 2017-11-30 DIAGNOSIS — C8330 Diffuse large B-cell lymphoma, unspecified site: Secondary | ICD-10-CM | POA: Diagnosis not present

## 2017-11-30 DIAGNOSIS — C8251 Diffuse follicle center lymphoma, lymph nodes of head, face, and neck: Secondary | ICD-10-CM

## 2017-11-30 DIAGNOSIS — Z452 Encounter for adjustment and management of vascular access device: Secondary | ICD-10-CM | POA: Diagnosis not present

## 2017-11-30 MED ORDER — HEPARIN SOD (PORK) LOCK FLUSH 100 UNIT/ML IV SOLN
500.0000 [IU] | Freq: Once | INTRAVENOUS | Status: AC
  Administered 2017-11-30: 500 [IU] via INTRAVENOUS
  Filled 2017-11-30: qty 5

## 2017-11-30 MED ORDER — SODIUM CHLORIDE 0.9% FLUSH
10.0000 mL | Freq: Once | INTRAVENOUS | Status: AC
  Administered 2017-11-30: 10 mL via INTRAVENOUS
  Filled 2017-11-30: qty 10

## 2017-11-30 NOTE — Patient Instructions (Signed)

## 2018-01-02 DIAGNOSIS — H34813 Central retinal vein occlusion, bilateral, with macular edema: Secondary | ICD-10-CM | POA: Diagnosis not present

## 2018-01-14 ENCOUNTER — Other Ambulatory Visit: Payer: Self-pay | Admitting: Dermatology

## 2018-01-14 DIAGNOSIS — L570 Actinic keratosis: Secondary | ICD-10-CM | POA: Diagnosis not present

## 2018-01-14 DIAGNOSIS — L820 Inflamed seborrheic keratosis: Secondary | ICD-10-CM | POA: Diagnosis not present

## 2018-01-14 DIAGNOSIS — D044 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: Secondary | ICD-10-CM | POA: Diagnosis not present

## 2018-01-14 DIAGNOSIS — D485 Neoplasm of uncertain behavior of skin: Secondary | ICD-10-CM | POA: Diagnosis not present

## 2018-01-14 DIAGNOSIS — D229 Melanocytic nevi, unspecified: Secondary | ICD-10-CM | POA: Diagnosis not present

## 2018-01-14 DIAGNOSIS — C44319 Basal cell carcinoma of skin of other parts of face: Secondary | ICD-10-CM | POA: Diagnosis not present

## 2018-01-22 ENCOUNTER — Encounter: Payer: Self-pay | Admitting: Internal Medicine

## 2018-01-22 ENCOUNTER — Telehealth: Payer: Self-pay | Admitting: Internal Medicine

## 2018-01-22 ENCOUNTER — Inpatient Hospital Stay: Payer: Medicare HMO

## 2018-01-22 ENCOUNTER — Telehealth: Payer: Self-pay | Admitting: *Deleted

## 2018-01-22 ENCOUNTER — Inpatient Hospital Stay: Payer: Medicare HMO | Attending: Internal Medicine | Admitting: Internal Medicine

## 2018-01-22 VITALS — BP 139/78 | HR 79 | Temp 97.6°F | Resp 18 | Ht 69.0 in | Wt 179.9 lb

## 2018-01-22 DIAGNOSIS — Z9221 Personal history of antineoplastic chemotherapy: Secondary | ICD-10-CM | POA: Insufficient documentation

## 2018-01-22 DIAGNOSIS — Z452 Encounter for adjustment and management of vascular access device: Secondary | ICD-10-CM | POA: Diagnosis not present

## 2018-01-22 DIAGNOSIS — I10 Essential (primary) hypertension: Secondary | ICD-10-CM

## 2018-01-22 DIAGNOSIS — D696 Thrombocytopenia, unspecified: Secondary | ICD-10-CM | POA: Insufficient documentation

## 2018-01-22 DIAGNOSIS — C884 Extranodal marginal zone B-cell lymphoma of mucosa-associated lymphoid tissue [MALT-lymphoma]: Secondary | ICD-10-CM | POA: Diagnosis not present

## 2018-01-22 DIAGNOSIS — Z79899 Other long term (current) drug therapy: Secondary | ICD-10-CM | POA: Insufficient documentation

## 2018-01-22 DIAGNOSIS — C8251 Diffuse follicle center lymphoma, lymph nodes of head, face, and neck: Secondary | ICD-10-CM

## 2018-01-22 DIAGNOSIS — Z95828 Presence of other vascular implants and grafts: Secondary | ICD-10-CM

## 2018-01-22 DIAGNOSIS — E559 Vitamin D deficiency, unspecified: Secondary | ICD-10-CM

## 2018-01-22 LAB — COMPREHENSIVE METABOLIC PANEL
ALT: 16 U/L (ref 0–55)
AST: 19 U/L (ref 5–34)
Albumin: 4.4 g/dL (ref 3.5–5.0)
Alkaline Phosphatase: 59 U/L (ref 40–150)
Anion gap: 6 (ref 3–11)
BILIRUBIN TOTAL: 0.8 mg/dL (ref 0.2–1.2)
BUN: 18 mg/dL (ref 7–26)
CHLORIDE: 105 mmol/L (ref 98–109)
CO2: 29 mmol/L (ref 22–29)
CREATININE: 1.2 mg/dL (ref 0.70–1.30)
Calcium: 10.2 mg/dL (ref 8.4–10.4)
GFR, EST NON AFRICAN AMERICAN: 57 mL/min — AB (ref >60)
Glucose, Bld: 147 mg/dL — ABNORMAL HIGH (ref 70–140)
POTASSIUM: 4 mmol/L (ref 3.5–5.1)
Sodium: 140 mmol/L (ref 136–145)
TOTAL PROTEIN: 7.3 g/dL (ref 6.4–8.3)

## 2018-01-22 LAB — CBC WITH DIFFERENTIAL/PLATELET
Basophils Absolute: 0.1 10*3/uL (ref 0.0–0.1)
Basophils Relative: 1 %
EOS PCT: 4 %
Eosinophils Absolute: 0.2 10*3/uL (ref 0.0–0.5)
HCT: 43.6 % (ref 38.4–49.9)
Hemoglobin: 14.4 g/dL (ref 13.0–17.1)
LYMPHS ABS: 1.3 10*3/uL (ref 0.9–3.3)
LYMPHS PCT: 22 %
MCH: 28.8 pg (ref 27.2–33.4)
MCHC: 32.9 g/dL (ref 32.0–36.0)
MCV: 87.5 fL (ref 79.3–98.0)
MONO ABS: 0.3 10*3/uL (ref 0.1–0.9)
MONOS PCT: 5 %
Neutro Abs: 3.9 10*3/uL (ref 1.5–6.5)
Neutrophils Relative %: 68 %
PLATELETS: 132 10*3/uL — AB (ref 140–400)
RBC: 4.99 MIL/uL (ref 4.20–5.82)
RDW: 14.3 % (ref 11.0–14.6)
WBC: 5.7 10*3/uL (ref 4.0–10.3)

## 2018-01-22 LAB — LACTATE DEHYDROGENASE: LDH: 205 U/L (ref 125–245)

## 2018-01-22 MED ORDER — HEPARIN SOD (PORK) LOCK FLUSH 100 UNIT/ML IV SOLN
500.0000 [IU] | Freq: Once | INTRAVENOUS | Status: AC | PRN
  Administered 2018-01-22: 500 [IU] via INTRAVENOUS
  Filled 2018-01-22: qty 5

## 2018-01-22 MED ORDER — SODIUM CHLORIDE 0.9 % IJ SOLN
10.0000 mL | INTRAMUSCULAR | Status: DC | PRN
  Administered 2018-01-22: 10 mL via INTRAVENOUS
  Filled 2018-01-22: qty 10

## 2018-01-22 NOTE — Telephone Encounter (Signed)
Returned patient's call after receipt of voicemail requesting if he needs to fast for today's lab work.  Lab appointment scheduled for 9:00 am.  Advised he may eat, does not have to fast for [facility] lab work.  Denies further questions or needs at this time.

## 2018-01-22 NOTE — Progress Notes (Signed)
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  CURRENT THERAPY: Observation.   INTERVAL HISTORY: [name] [name] 77 y.o. male returns to the clinic today for annual follow-up visit.  The patient is feeling fine with no specific complaints.  He denied having any chest pain, shortness of breath, cough or hemoptysis.  He denied having any fever or chills.  He has no nausea, vomiting, diarrhea or constipation.  He has no bleeding, bruises or ecchymosis.  He has no recent weight loss or night sweats.  The patient is here today for evaluation with repeat CBC, comprehensive metabolic panel and LDH.   MEDICAL HISTORY: Past Medical History:  Diagnosis Date  . Femur fracture ([place])   . Hip fracture ([place])   . Hypertension   . Malignant neoplasm of pancreas, part unspecified   . Other malignant lymphomas, unspecified site, extranodal and solid organ sites     ALLERGIES:  is allergic to iohexol and sulfa antibiotics.  MEDICATIONS:  Current Outpatient Medications  Medication Sig Dispense Refill  . Aflibercept (EYLEA) 2 MG/0.05ML SOLN Inject into the eye as directed. Take every 6 wks in eye doctor office.    . Ascorbic Acid (VITAMIN C) 1000 MG tablet Take 2,000 mg by mouth daily.      . Calcium-Magnesium (CALMAG THINS PO) Take by mouth.    . cholecalciferol (VITAMIN D) 1000 UNITS tablet Take 15,000 Units by mouth daily.     Marland Kitchen lisinopril-hydrochlorothiazide (PRINZIDE,ZESTORETIC) 20-12.5 MG per tablet Take 2 tablets by mouth  daily.     . Multiple Vitamin (MULTIVITAMIN) tablet Take 6 tablets by mouth daily.      Marland Kitchen neomycin-polymyxin b-dexamethasone (MAXITROL) 3.5-10000-0.1 OINT     . Probiotic Product (PROBIOTIC FORMULA PO) Take 1 tablet by mouth daily.      Marland Kitchen UNABLE TO FIND Take 1 capsule by mouth daily. Med Name:TUMERIC    . Zinc 30 MG TABS Take 1 tablet by mouth every morning.     No current facility-administered medications for this visit.    Facility-Administered Medications Ordered in Other Visits  Medication Dose Route Frequency Provider Last Rate Last Dose  . heparin lock flush 100 unit/mL  500 Units Intravenous Once PRN Curt Bears, MD      . sodium chloride 0.9 % injection 10 mL  10 mL Intravenous PRN Curt Bears, MD   10 mL at 12/29/14 1026  . sodium chloride 0.9 % injection 10 mL  10 mL Intravenous PRN Curt Bears, MD      . sodium chloride 0.9 % injection 10 mL  10 mL Intravenous PRN Curt Bears, MD   10 mL at 01/22/18 8182    SURGICAL HISTORY:  Past Surgical History:  Procedure Laterality Date  . FEMUR SURGERY Bilateral   . LEG SURGERY Left   . SHOULDER SURGERY Left     REVIEW OF SYSTEMS:  A comprehensive review of systems was negative.   PHYSICAL EXAMINATION: General appearance: alert, cooperative and no distress Head: Normocephalic, without obvious abnormality, atraumatic Neck: no adenopathy, no JVD, supple, symmetrical, trachea midline  and thyroid not enlarged, symmetric, no tenderness/mass/nodules Lymph nodes: Cervical, supraclavicular, and axillary nodes normal. Resp: clear to auscultation bilaterally Back: symmetric, no curvature. ROM normal. No CVA tenderness. Cardio: regular rate and rhythm, S1, S2 normal, no murmur, click, rub or gallop GI: soft, non-tender; bowel sounds normal; no masses,  no organomegaly Extremities: extremities normal, atraumatic, no cyanosis or edema  ECOG PERFORMANCE STATUS: 0 - Asymptomatic  Blood pressure 139/78, pulse 79,  temperature 97.6 F (36.4 C), temperature source Oral, resp. rate 18, height 5\' 9"  (1.753 m), weight 179 lb 14.4 oz (81.6 kg), SpO2 100 %.  LABORATORY DATA: Lab Results  Component Value Date   WBC 6.5 01/23/2017   HGB 14.8 01/23/2017   HCT 45.3 01/23/2017   MCV 87.1 01/23/2017   PLT 143 01/23/2017      Chemistry      Component Value Date/Time   NA 140 01/23/2017 0958   K 4.0 01/23/2017 0958   CL 103 11/21/2012 0930   CO2 28 01/23/2017 0958   BUN 17.0 01/23/2017 0958   CREATININE 1.3 01/23/2017 0958      Component Value Date/Time   CALCIUM 10.2 01/23/2017 0958   ALKPHOS 61 01/23/2017 0958   AST 20 01/23/2017 0958   ALT 21 01/23/2017 0958   BILITOT 1.14 01/23/2017 0958       RADIOGRAPHIC STUDIES: No results found.  ASSESSMENT AND PLAN:  This is a very pleasant 77 years old white male with history of large B-cell non-Hodgkin lymphoma presented with a large mass close to the head of the pancreas status post systemic chemotherapy with CHOP/Rituxan for 4 cycles in addition to hyperthermic therapy in [place] with almost complete response of his disease. The patient is currently on observation.  He is feeling fine with no concerning complaints. CBC today is unremarkable except for mild thrombocytopenia.  I discussed the lab results with the patient and recommended for him to continue in observation with repeat CBC, comprehensive metabolic panel and LDH in 1 year.  He was advised to call immediately if he has any concerning symptoms in the interval especially any bleeding issues. The patient voices understanding of current disease status and treatment options and is in agreement with the current care plan. All questions were answered. The patient knows to call the clinic with any problems, questions or concerns. We can certainly see the patient much sooner if necessary. I spent 10 minutes counseling the patient face to face. The total time spent in the appointment was 15  minutes.  Disclaimer: This note was dictated with voice recognition software. Similar sounding words can inadvertently be transcribed and may not be corrected upon review.

## 2018-01-22 NOTE — Patient Instructions (Signed)

## 2018-01-22 NOTE — Telephone Encounter (Signed)
Scheduled appt per 5/7 los -Gave patient AVS and calender per los.  

## 2018-01-31 ENCOUNTER — Other Ambulatory Visit: Payer: Self-pay | Admitting: Dermatology

## 2018-01-31 DIAGNOSIS — C44319 Basal cell carcinoma of skin of other parts of face: Secondary | ICD-10-CM | POA: Diagnosis not present

## 2018-02-20 DIAGNOSIS — H34813 Central retinal vein occlusion, bilateral, with macular edema: Secondary | ICD-10-CM | POA: Diagnosis not present

## 2018-02-20 DIAGNOSIS — H3582 Retinal ischemia: Secondary | ICD-10-CM | POA: Diagnosis not present

## 2018-02-20 DIAGNOSIS — H43813 Vitreous degeneration, bilateral: Secondary | ICD-10-CM | POA: Diagnosis not present

## 2018-02-20 DIAGNOSIS — H35372 Puckering of macula, left eye: Secondary | ICD-10-CM | POA: Diagnosis not present

## 2018-03-05 ENCOUNTER — Inpatient Hospital Stay: Payer: Medicare HMO | Attending: Internal Medicine

## 2018-03-05 DIAGNOSIS — Z9221 Personal history of antineoplastic chemotherapy: Secondary | ICD-10-CM | POA: Insufficient documentation

## 2018-03-05 DIAGNOSIS — Z95828 Presence of other vascular implants and grafts: Secondary | ICD-10-CM

## 2018-03-05 DIAGNOSIS — C884 Extranodal marginal zone B-cell lymphoma of mucosa-associated lymphoid tissue [MALT-lymphoma]: Secondary | ICD-10-CM | POA: Insufficient documentation

## 2018-03-05 DIAGNOSIS — C8251 Diffuse follicle center lymphoma, lymph nodes of head, face, and neck: Secondary | ICD-10-CM

## 2018-03-05 DIAGNOSIS — Z452 Encounter for adjustment and management of vascular access device: Secondary | ICD-10-CM | POA: Insufficient documentation

## 2018-03-05 MED ORDER — HEPARIN SOD (PORK) LOCK FLUSH 100 UNIT/ML IV SOLN
500.0000 [IU] | Freq: Once | INTRAVENOUS | Status: AC | PRN
Start: 2018-03-05 — End: 2018-03-05
  Administered 2018-03-05: 500 [IU] via INTRAVENOUS
  Filled 2018-03-05: qty 5

## 2018-03-05 MED ORDER — SODIUM CHLORIDE 0.9 % IJ SOLN
10.0000 mL | INTRAMUSCULAR | Status: DC | PRN
  Administered 2018-03-05: 10 mL via INTRAVENOUS
  Filled 2018-03-05: qty 10

## 2018-03-07 DIAGNOSIS — D044 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: Secondary | ICD-10-CM | POA: Diagnosis not present

## 2018-04-09 DIAGNOSIS — E559 Vitamin D deficiency, unspecified: Secondary | ICD-10-CM | POA: Diagnosis not present

## 2018-04-09 DIAGNOSIS — Z8572 Personal history of non-Hodgkin lymphomas: Secondary | ICD-10-CM | POA: Diagnosis not present

## 2018-04-09 DIAGNOSIS — H348132 Central retinal vein occlusion, bilateral, stable: Secondary | ICD-10-CM | POA: Diagnosis not present

## 2018-04-09 DIAGNOSIS — Z0000 Encounter for general adult medical examination without abnormal findings: Secondary | ICD-10-CM | POA: Diagnosis not present

## 2018-04-09 DIAGNOSIS — D696 Thrombocytopenia, unspecified: Secondary | ICD-10-CM | POA: Diagnosis not present

## 2018-04-09 DIAGNOSIS — N401 Enlarged prostate with lower urinary tract symptoms: Secondary | ICD-10-CM | POA: Diagnosis not present

## 2018-04-09 DIAGNOSIS — I10 Essential (primary) hypertension: Secondary | ICD-10-CM | POA: Diagnosis not present

## 2018-04-09 DIAGNOSIS — Z1389 Encounter for screening for other disorder: Secondary | ICD-10-CM | POA: Diagnosis not present

## 2018-04-09 DIAGNOSIS — Z6826 Body mass index (BMI) 26.0-26.9, adult: Secondary | ICD-10-CM | POA: Diagnosis not present

## 2018-04-16 ENCOUNTER — Inpatient Hospital Stay: Payer: Medicare HMO

## 2018-04-17 DIAGNOSIS — H3582 Retinal ischemia: Secondary | ICD-10-CM | POA: Diagnosis not present

## 2018-04-17 DIAGNOSIS — H35372 Puckering of macula, left eye: Secondary | ICD-10-CM | POA: Diagnosis not present

## 2018-04-17 DIAGNOSIS — H43813 Vitreous degeneration, bilateral: Secondary | ICD-10-CM | POA: Diagnosis not present

## 2018-04-17 DIAGNOSIS — H34813 Central retinal vein occlusion, bilateral, with macular edema: Secondary | ICD-10-CM | POA: Diagnosis not present

## 2018-04-18 ENCOUNTER — Inpatient Hospital Stay: Payer: Medicare HMO | Attending: Internal Medicine

## 2018-04-18 DIAGNOSIS — Z95828 Presence of other vascular implants and grafts: Secondary | ICD-10-CM

## 2018-04-18 DIAGNOSIS — Z9221 Personal history of antineoplastic chemotherapy: Secondary | ICD-10-CM | POA: Insufficient documentation

## 2018-04-18 DIAGNOSIS — Z452 Encounter for adjustment and management of vascular access device: Secondary | ICD-10-CM | POA: Diagnosis not present

## 2018-04-18 DIAGNOSIS — C8251 Diffuse follicle center lymphoma, lymph nodes of head, face, and neck: Secondary | ICD-10-CM

## 2018-04-18 DIAGNOSIS — C884 Extranodal marginal zone B-cell lymphoma of mucosa-associated lymphoid tissue [MALT-lymphoma]: Secondary | ICD-10-CM | POA: Insufficient documentation

## 2018-04-18 MED ORDER — HEPARIN SOD (PORK) LOCK FLUSH 100 UNIT/ML IV SOLN
500.0000 [IU] | Freq: Once | INTRAVENOUS | Status: AC | PRN
Start: 2018-04-18 — End: 2018-04-18
  Administered 2018-04-18: 500 [IU] via INTRAVENOUS
  Filled 2018-04-18: qty 5

## 2018-04-18 MED ORDER — SODIUM CHLORIDE 0.9 % IJ SOLN
10.0000 mL | INTRAMUSCULAR | Status: DC | PRN
  Administered 2018-04-18: 10 mL via INTRAVENOUS
  Filled 2018-04-18: qty 10

## 2018-05-28 ENCOUNTER — Inpatient Hospital Stay: Payer: Medicare HMO | Attending: Internal Medicine

## 2018-05-28 DIAGNOSIS — Z95828 Presence of other vascular implants and grafts: Secondary | ICD-10-CM

## 2018-05-28 DIAGNOSIS — Z9221 Personal history of antineoplastic chemotherapy: Secondary | ICD-10-CM | POA: Insufficient documentation

## 2018-05-28 DIAGNOSIS — C8251 Diffuse follicle center lymphoma, lymph nodes of head, face, and neck: Secondary | ICD-10-CM

## 2018-05-28 DIAGNOSIS — C884 Extranodal marginal zone B-cell lymphoma of mucosa-associated lymphoid tissue [MALT-lymphoma]: Secondary | ICD-10-CM | POA: Diagnosis not present

## 2018-05-28 DIAGNOSIS — Z452 Encounter for adjustment and management of vascular access device: Secondary | ICD-10-CM | POA: Diagnosis not present

## 2018-05-28 MED ORDER — HEPARIN SOD (PORK) LOCK FLUSH 100 UNIT/ML IV SOLN
500.0000 [IU] | Freq: Once | INTRAVENOUS | Status: AC | PRN
  Administered 2018-05-28: 500 [IU] via INTRAVENOUS
  Filled 2018-05-28: qty 5

## 2018-05-28 MED ORDER — SODIUM CHLORIDE 0.9 % IJ SOLN
10.0000 mL | INTRAMUSCULAR | Status: DC | PRN
  Administered 2018-05-28: 10 mL via INTRAVENOUS
  Filled 2018-05-28: qty 10

## 2018-06-03 DIAGNOSIS — Z13820 Encounter for screening for osteoporosis: Secondary | ICD-10-CM | POA: Diagnosis not present

## 2018-06-11 DIAGNOSIS — H35372 Puckering of macula, left eye: Secondary | ICD-10-CM | POA: Diagnosis not present

## 2018-06-11 DIAGNOSIS — H43813 Vitreous degeneration, bilateral: Secondary | ICD-10-CM | POA: Diagnosis not present

## 2018-06-11 DIAGNOSIS — H34813 Central retinal vein occlusion, bilateral, with macular edema: Secondary | ICD-10-CM | POA: Diagnosis not present

## 2018-06-11 DIAGNOSIS — H3582 Retinal ischemia: Secondary | ICD-10-CM | POA: Diagnosis not present

## 2018-07-09 ENCOUNTER — Inpatient Hospital Stay: Payer: Medicare HMO | Attending: Internal Medicine

## 2018-07-09 DIAGNOSIS — Z809 Family history of malignant neoplasm, unspecified: Secondary | ICD-10-CM | POA: Diagnosis not present

## 2018-07-09 DIAGNOSIS — C884 Extranodal marginal zone B-cell lymphoma of mucosa-associated lymphoid tissue [MALT-lymphoma]: Secondary | ICD-10-CM | POA: Insufficient documentation

## 2018-07-09 DIAGNOSIS — Z452 Encounter for adjustment and management of vascular access device: Secondary | ICD-10-CM | POA: Insufficient documentation

## 2018-07-09 DIAGNOSIS — Z9221 Personal history of antineoplastic chemotherapy: Secondary | ICD-10-CM | POA: Insufficient documentation

## 2018-07-09 DIAGNOSIS — Z85828 Personal history of other malignant neoplasm of skin: Secondary | ICD-10-CM | POA: Diagnosis not present

## 2018-07-09 DIAGNOSIS — Z95828 Presence of other vascular implants and grafts: Secondary | ICD-10-CM

## 2018-07-09 DIAGNOSIS — C8251 Diffuse follicle center lymphoma, lymph nodes of head, face, and neck: Secondary | ICD-10-CM

## 2018-07-09 DIAGNOSIS — Z8249 Family history of ischemic heart disease and other diseases of the circulatory system: Secondary | ICD-10-CM | POA: Diagnosis not present

## 2018-07-09 DIAGNOSIS — I10 Essential (primary) hypertension: Secondary | ICD-10-CM | POA: Diagnosis not present

## 2018-07-09 DIAGNOSIS — R32 Unspecified urinary incontinence: Secondary | ICD-10-CM | POA: Diagnosis not present

## 2018-07-09 DIAGNOSIS — C8590 Non-Hodgkin lymphoma, unspecified, unspecified site: Secondary | ICD-10-CM | POA: Diagnosis not present

## 2018-07-09 MED ORDER — SODIUM CHLORIDE 0.9 % IJ SOLN
10.0000 mL | INTRAMUSCULAR | Status: DC | PRN
  Administered 2018-07-09: 10 mL via INTRAVENOUS
  Filled 2018-07-09: qty 10

## 2018-07-09 MED ORDER — HEPARIN SOD (PORK) LOCK FLUSH 100 UNIT/ML IV SOLN
500.0000 [IU] | Freq: Once | INTRAVENOUS | Status: AC | PRN
  Administered 2018-07-09: 500 [IU] via INTRAVENOUS
  Filled 2018-07-09: qty 5

## 2018-08-07 DIAGNOSIS — L309 Dermatitis, unspecified: Secondary | ICD-10-CM | POA: Diagnosis not present

## 2018-08-07 DIAGNOSIS — L570 Actinic keratosis: Secondary | ICD-10-CM | POA: Diagnosis not present

## 2018-08-13 DIAGNOSIS — H43391 Other vitreous opacities, right eye: Secondary | ICD-10-CM | POA: Diagnosis not present

## 2018-08-13 DIAGNOSIS — H43813 Vitreous degeneration, bilateral: Secondary | ICD-10-CM | POA: Diagnosis not present

## 2018-08-13 DIAGNOSIS — H3582 Retinal ischemia: Secondary | ICD-10-CM | POA: Diagnosis not present

## 2018-08-13 DIAGNOSIS — H34813 Central retinal vein occlusion, bilateral, with macular edema: Secondary | ICD-10-CM | POA: Diagnosis not present

## 2018-08-20 ENCOUNTER — Inpatient Hospital Stay: Payer: Medicare HMO | Attending: Internal Medicine

## 2018-08-20 DIAGNOSIS — Z9221 Personal history of antineoplastic chemotherapy: Secondary | ICD-10-CM | POA: Insufficient documentation

## 2018-08-20 DIAGNOSIS — Z452 Encounter for adjustment and management of vascular access device: Secondary | ICD-10-CM | POA: Insufficient documentation

## 2018-08-20 DIAGNOSIS — C884 Extranodal marginal zone B-cell lymphoma of mucosa-associated lymphoid tissue [MALT-lymphoma]: Secondary | ICD-10-CM | POA: Diagnosis not present

## 2018-08-20 DIAGNOSIS — Z95828 Presence of other vascular implants and grafts: Secondary | ICD-10-CM

## 2018-08-20 DIAGNOSIS — C8251 Diffuse follicle center lymphoma, lymph nodes of head, face, and neck: Secondary | ICD-10-CM

## 2018-08-20 MED ORDER — SODIUM CHLORIDE 0.9% FLUSH
10.0000 mL | Freq: Once | INTRAVENOUS | Status: AC
  Administered 2018-08-20: 10 mL via INTRAVENOUS
  Filled 2018-08-20: qty 10

## 2018-08-20 MED ORDER — SODIUM CHLORIDE 0.9% FLUSH
10.0000 mL | Freq: Once | INTRAVENOUS | Status: DC
  Filled 2018-08-20: qty 10

## 2018-08-20 MED ORDER — HEPARIN SOD (PORK) LOCK FLUSH 100 UNIT/ML IV SOLN
500.0000 [IU] | Freq: Once | INTRAVENOUS | Status: AC | PRN
  Administered 2018-08-20: 500 [IU] via INTRAVENOUS
  Filled 2018-08-20: qty 5

## 2018-08-20 NOTE — Patient Instructions (Signed)
Implanted Port Home Guide An implanted port is a type of central line that is placed under the skin. Central lines are used to provide IV access when treatment or nutrition needs to be given through a person's veins. Implanted ports are used for long-term IV access. An implanted port may be placed because:  You need IV medicine that would be irritating to the small veins in your hands or arms.  You need long-term IV medicines, such as antibiotics.  You need IV nutrition for a long period.  You need frequent blood draws for lab tests.  You need dialysis.  Implanted ports are usually placed in the chest area, but they can also be placed in the upper arm, the abdomen, or the leg. An implanted port has two main parts:  Reservoir. The reservoir is round and will appear as a small, raised area under your skin. The reservoir is the part where a needle is inserted to give medicines or draw blood.  Catheter. The catheter is a thin, flexible tube that extends from the reservoir. The catheter is placed into a large vein. Medicine that is inserted into the reservoir goes into the catheter and then into the vein.  How will I care for my incision site? Do not get the incision site wet. Bathe or shower as directed by your health care provider. How is my port accessed? Special steps must be taken to access the port:  Before the port is accessed, a numbing cream can be placed on the skin. This helps numb the skin over the port site.  Your health care provider uses a sterile technique to access the port. ? Your health care provider must put on a mask and sterile gloves. ? The skin over your port is cleaned carefully with an antiseptic and allowed to dry. ? The port is gently pinched between sterile gloves, and a needle is inserted into the port.  Only "non-coring" port needles should be used to access the port. Once the port is accessed, a blood return should be checked. This helps ensure that the port  is in the vein and is not clogged.  If your port needs to remain accessed for a constant infusion, a clear (transparent) bandage will be placed over the needle site. The bandage and needle will need to be changed every week, or as directed by your health care provider.  Keep the bandage covering the needle clean and dry. Do not get it wet. Follow your health care provider's instructions on how to take a shower or bath while the port is accessed.  If your port does not need to stay accessed, no bandage is needed over the port.  What is flushing? Flushing helps keep the port from getting clogged. Follow your health care provider's instructions on how and when to flush the port. Ports are usually flushed with saline solution or a medicine called heparin. The need for flushing will depend on how the port is used.  If the port is used for intermittent medicines or blood draws, the port will need to be flushed: ? After medicines have been given. ? After blood has been drawn. ? As part of routine maintenance.  If a constant infusion is running, the port may not need to be flushed.  How long will my port stay implanted? The port can stay in for as long as your health care provider thinks it is needed. When it is time for the port to come out, surgery will be   done to remove it. The procedure is similar to the one performed when the port was put in. When should I seek immediate medical care? When you have an implanted port, you should seek immediate medical care if:  You notice a bad smell coming from the incision site.  You have swelling, redness, or drainage at the incision site.  You have more swelling or pain at the port site or the surrounding area.  You have a fever that is not controlled with medicine.  This information is not intended to replace advice given to you by your health care provider. Make sure you discuss any questions you have with your health care provider. Document  Released: 09/04/2005 Document Revised: 02/10/2016 Document Reviewed: 05/12/2013 Elsevier Interactive Patient Education  2017 Elsevier Inc.  

## 2018-10-01 ENCOUNTER — Inpatient Hospital Stay: Payer: Medicare HMO | Attending: Internal Medicine

## 2018-10-01 DIAGNOSIS — Z9221 Personal history of antineoplastic chemotherapy: Secondary | ICD-10-CM | POA: Diagnosis not present

## 2018-10-01 DIAGNOSIS — Z95828 Presence of other vascular implants and grafts: Secondary | ICD-10-CM

## 2018-10-01 DIAGNOSIS — C884 Extranodal marginal zone B-cell lymphoma of mucosa-associated lymphoid tissue [MALT-lymphoma]: Secondary | ICD-10-CM | POA: Diagnosis not present

## 2018-10-01 DIAGNOSIS — Z452 Encounter for adjustment and management of vascular access device: Secondary | ICD-10-CM | POA: Diagnosis not present

## 2018-10-01 MED ORDER — HEPARIN SOD (PORK) LOCK FLUSH 100 UNIT/ML IV SOLN
500.0000 [IU] | Freq: Once | INTRAVENOUS | Status: AC
  Administered 2018-10-01: 500 [IU] via INTRAVENOUS
  Filled 2018-10-01: qty 5

## 2018-10-01 MED ORDER — SODIUM CHLORIDE 0.9% FLUSH
10.0000 mL | INTRAVENOUS | Status: DC | PRN
  Administered 2018-10-01: 10 mL via INTRAVENOUS
  Filled 2018-10-01: qty 10

## 2018-10-22 DIAGNOSIS — H43813 Vitreous degeneration, bilateral: Secondary | ICD-10-CM | POA: Diagnosis not present

## 2018-10-22 DIAGNOSIS — H34813 Central retinal vein occlusion, bilateral, with macular edema: Secondary | ICD-10-CM | POA: Diagnosis not present

## 2018-10-22 DIAGNOSIS — H3582 Retinal ischemia: Secondary | ICD-10-CM | POA: Diagnosis not present

## 2018-10-22 DIAGNOSIS — H43391 Other vitreous opacities, right eye: Secondary | ICD-10-CM | POA: Diagnosis not present

## 2018-11-12 ENCOUNTER — Inpatient Hospital Stay: Payer: Medicare HMO | Attending: Internal Medicine

## 2018-11-12 DIAGNOSIS — Z452 Encounter for adjustment and management of vascular access device: Secondary | ICD-10-CM | POA: Insufficient documentation

## 2018-11-12 DIAGNOSIS — Z9221 Personal history of antineoplastic chemotherapy: Secondary | ICD-10-CM | POA: Diagnosis not present

## 2018-11-12 DIAGNOSIS — C884 Extranodal marginal zone B-cell lymphoma of mucosa-associated lymphoid tissue [MALT-lymphoma]: Secondary | ICD-10-CM | POA: Insufficient documentation

## 2018-11-12 DIAGNOSIS — Z95828 Presence of other vascular implants and grafts: Secondary | ICD-10-CM

## 2018-11-12 MED ORDER — HEPARIN SOD (PORK) LOCK FLUSH 100 UNIT/ML IV SOLN
500.0000 [IU] | Freq: Once | INTRAVENOUS | Status: AC
  Administered 2018-11-12: 500 [IU] via INTRAVENOUS
  Filled 2018-11-12: qty 5

## 2018-11-12 MED ORDER — SODIUM CHLORIDE 0.9% FLUSH
10.0000 mL | INTRAVENOUS | Status: DC | PRN
  Administered 2018-11-12: 10 mL via INTRAVENOUS
  Filled 2018-11-12: qty 10

## 2018-12-24 ENCOUNTER — Other Ambulatory Visit: Payer: Self-pay

## 2018-12-24 ENCOUNTER — Inpatient Hospital Stay: Payer: Medicare HMO | Attending: Internal Medicine

## 2018-12-24 DIAGNOSIS — C884 Extranodal marginal zone B-cell lymphoma of mucosa-associated lymphoid tissue [MALT-lymphoma]: Secondary | ICD-10-CM | POA: Insufficient documentation

## 2018-12-24 DIAGNOSIS — Z95828 Presence of other vascular implants and grafts: Secondary | ICD-10-CM

## 2018-12-24 DIAGNOSIS — Z9221 Personal history of antineoplastic chemotherapy: Secondary | ICD-10-CM | POA: Insufficient documentation

## 2018-12-24 DIAGNOSIS — Z452 Encounter for adjustment and management of vascular access device: Secondary | ICD-10-CM | POA: Diagnosis not present

## 2018-12-24 MED ORDER — HEPARIN SOD (PORK) LOCK FLUSH 100 UNIT/ML IV SOLN
500.0000 [IU] | Freq: Once | INTRAVENOUS | Status: AC
  Administered 2018-12-24: 500 [IU] via INTRAVENOUS
  Filled 2018-12-24: qty 5

## 2018-12-24 MED ORDER — SODIUM CHLORIDE 0.9% FLUSH
10.0000 mL | INTRAVENOUS | Status: DC | PRN
  Administered 2018-12-24: 10 mL via INTRAVENOUS
  Filled 2018-12-24: qty 10

## 2019-01-07 DIAGNOSIS — H34813 Central retinal vein occlusion, bilateral, with macular edema: Secondary | ICD-10-CM | POA: Diagnosis not present

## 2019-01-07 DIAGNOSIS — H43813 Vitreous degeneration, bilateral: Secondary | ICD-10-CM | POA: Diagnosis not present

## 2019-02-03 ENCOUNTER — Other Ambulatory Visit: Payer: Self-pay | Admitting: Medical Oncology

## 2019-02-03 DIAGNOSIS — C8251 Diffuse follicle center lymphoma, lymph nodes of head, face, and neck: Secondary | ICD-10-CM

## 2019-02-04 ENCOUNTER — Encounter: Payer: Self-pay | Admitting: Internal Medicine

## 2019-02-04 ENCOUNTER — Inpatient Hospital Stay: Payer: Medicare HMO | Attending: Internal Medicine | Admitting: Internal Medicine

## 2019-02-04 ENCOUNTER — Other Ambulatory Visit: Payer: Self-pay | Admitting: *Deleted

## 2019-02-04 ENCOUNTER — Inpatient Hospital Stay: Payer: Medicare HMO

## 2019-02-04 ENCOUNTER — Other Ambulatory Visit: Payer: Self-pay

## 2019-02-04 ENCOUNTER — Telehealth: Payer: Self-pay | Admitting: Internal Medicine

## 2019-02-04 VITALS — BP 144/79 | HR 88 | Temp 98.3°F | Resp 20 | Ht 69.0 in | Wt 178.7 lb

## 2019-02-04 DIAGNOSIS — I10 Essential (primary) hypertension: Secondary | ICD-10-CM | POA: Insufficient documentation

## 2019-02-04 DIAGNOSIS — C8251 Diffuse follicle center lymphoma, lymph nodes of head, face, and neck: Secondary | ICD-10-CM

## 2019-02-04 DIAGNOSIS — C884 Extranodal marginal zone B-cell lymphoma of mucosa-associated lymphoid tissue [MALT-lymphoma]: Secondary | ICD-10-CM | POA: Diagnosis not present

## 2019-02-04 DIAGNOSIS — Z452 Encounter for adjustment and management of vascular access device: Secondary | ICD-10-CM | POA: Insufficient documentation

## 2019-02-04 DIAGNOSIS — Z79899 Other long term (current) drug therapy: Secondary | ICD-10-CM | POA: Diagnosis not present

## 2019-02-04 DIAGNOSIS — Z9221 Personal history of antineoplastic chemotherapy: Secondary | ICD-10-CM | POA: Insufficient documentation

## 2019-02-04 DIAGNOSIS — Z95828 Presence of other vascular implants and grafts: Secondary | ICD-10-CM | POA: Insufficient documentation

## 2019-02-04 LAB — CBC WITH DIFFERENTIAL (CANCER CENTER ONLY)
Abs Immature Granulocytes: 0.02 10*3/uL (ref 0.00–0.07)
Basophils Absolute: 0 10*3/uL (ref 0.0–0.1)
Basophils Relative: 1 %
Eosinophils Absolute: 0.2 10*3/uL (ref 0.0–0.5)
Eosinophils Relative: 3 %
HCT: 44.5 % (ref 39.0–52.0)
Hemoglobin: 14.5 g/dL (ref 13.0–17.0)
Immature Granulocytes: 0 %
Lymphocytes Relative: 22 %
Lymphs Abs: 1.4 10*3/uL (ref 0.7–4.0)
MCH: 27.9 pg (ref 26.0–34.0)
MCHC: 32.6 g/dL (ref 30.0–36.0)
MCV: 85.7 fL (ref 80.0–100.0)
Monocytes Absolute: 0.4 10*3/uL (ref 0.1–1.0)
Monocytes Relative: 7 %
Neutro Abs: 4.4 10*3/uL (ref 1.7–7.7)
Neutrophils Relative %: 67 %
Platelet Count: 156 10*3/uL (ref 150–400)
RBC: 5.19 MIL/uL (ref 4.22–5.81)
RDW: 14.3 % (ref 11.5–15.5)
WBC Count: 6.4 10*3/uL (ref 4.0–10.5)
nRBC: 0 % (ref 0.0–0.2)

## 2019-02-04 LAB — CMP (CANCER CENTER ONLY)
ALT: 18 U/L (ref 0–44)
AST: 20 U/L (ref 15–41)
Albumin: 4.3 g/dL (ref 3.5–5.0)
Alkaline Phosphatase: 64 U/L (ref 38–126)
Anion gap: 9 (ref 5–15)
BUN: 15 mg/dL (ref 8–23)
CO2: 25 mmol/L (ref 22–32)
Calcium: 9.4 mg/dL (ref 8.9–10.3)
Chloride: 105 mmol/L (ref 98–111)
Creatinine: 1.28 mg/dL — ABNORMAL HIGH (ref 0.61–1.24)
GFR, Est AFR Am: >60 mL/min (ref >60)
GFR, Estimated: 53 mL/min — ABNORMAL LOW (ref >60)
Glucose, Bld: 118 mg/dL — ABNORMAL HIGH (ref 70–99)
Potassium: 4.2 mmol/L (ref 3.5–5.1)
Sodium: 139 mmol/L (ref 135–145)
Total Bilirubin: 0.9 mg/dL (ref 0.3–1.2)
Total Protein: 7.6 g/dL (ref 6.5–8.1)

## 2019-02-04 LAB — LACTATE DEHYDROGENASE: LDH: 191 U/L (ref 98–192)

## 2019-02-04 MED ORDER — HEPARIN SOD (PORK) LOCK FLUSH 100 UNIT/ML IV SOLN
500.0000 [IU] | Freq: Once | INTRAVENOUS | Status: AC
  Administered 2019-02-04: 11:00:00 500 [IU]
  Filled 2019-02-04: qty 5

## 2019-02-04 MED ORDER — SODIUM CHLORIDE 0.9% FLUSH
10.0000 mL | Freq: Once | INTRAVENOUS | Status: AC
  Administered 2019-02-04: 10 mL
  Filled 2019-02-04: qty 10

## 2019-02-04 NOTE — Progress Notes (Signed)
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  CURRENT THERAPY: Observation.   INTERVAL HISTORY: [name] [name] 78 y.o. adult returns to the clinic today for annual follow-up visit.  The patient is feeling fine today with no concerning complaints.  He denied having any chest pain, shortness of breath, cough or hemoptysis.  He denied having any fever or chills.  He has no nausea, vomiting, diarrhea or constipation.  He has no headache or visual changes.  He is here today for evaluation and repeat blood work.  MEDICAL HISTORY: Past Medical History:  Diagnosis Date  . Femur fracture ([place])   . Hip fracture ([place])   . Hypertension   . Malignant neoplasm of pancreas, part unspecified   . Other malignant lymphomas, unspecified site, extranodal and solid organ sites     ALLERGIES:  is allergic to iohexol and sulfa antibiotics.  MEDICATIONS:  Current Outpatient Medications  Medication Sig Dispense Refill  . Aflibercept (EYLEA) 2 MG/0.05ML SOLN Inject into the eye as directed. Take every 6 wks in eye doctor office.    . Ascorbic Acid (VITAMIN C) 1000 MG tablet Take 2,000 mg by mouth daily.      . Calcium-Magnesium (CALMAG THINS PO) Take by mouth.    . cholecalciferol (VITAMIN D) 1000 UNITS tablet Take 15,000 Units by mouth daily.     Marland Kitchen lisinopril-hydrochlorothiazide (PRINZIDE,ZESTORETIC) 20-12.5 MG per tablet Take 2 tablets by mouth daily.     . Multiple Vitamin (MULTIVITAMIN) tablet Take 6 tablets by mouth daily.       Marland Kitchen neomycin-polymyxin b-dexamethasone (MAXITROL) 3.5-10000-0.1 OINT     . Probiotic Product (PROBIOTIC FORMULA PO) Take 1 tablet by mouth daily.      Marland Kitchen UNABLE TO FIND Take 1 capsule by mouth daily. Med Name:TUMERIC    . Zinc 30 MG TABS Take 1 tablet by mouth every morning.     No current facility-administered medications for this visit.    Facility-Administered Medications Ordered in Other Visits  Medication Dose Route Frequency Provider Last Rate Last Dose  . heparin lock flush 100 unit/mL  500 Units Intravenous Once PRN Curt Bears, MD      . sodium chloride 0.9 % injection 10 mL  10 mL Intravenous PRN Curt Bears, MD   10 mL at 12/29/14 1026  . sodium chloride 0.9 % injection 10 mL  10 mL Intravenous PRN Curt Bears, MD        SURGICAL HISTORY:  Past Surgical History:  Procedure Laterality Date  . FEMUR SURGERY Bilateral   . LEG SURGERY Left   . SHOULDER SURGERY Left     REVIEW OF SYSTEMS:  A comprehensive review of systems was negative.   PHYSICAL EXAMINATION: General appearance: alert, cooperative and no distress Head: Normocephalic, without obvious abnormality, atraumatic Neck: no adenopathy, no JVD, supple, symmetrical, trachea midline and thyroid not enlarged, symmetric, no tenderness/mass/nodules Lymph nodes: Cervical, supraclavicular, and axillary nodes normal. Resp: clear to auscultation bilaterally Back: symmetric, no curvature. ROM normal. No CVA tenderness. Cardio: regular rate and rhythm, S1, S2 normal, no murmur,  click, rub or gallop GI: soft, non-tender; bowel sounds normal; no masses,  no organomegaly Extremities: extremities normal, atraumatic, no cyanosis or edema  ECOG PERFORMANCE STATUS: 0 - Asymptomatic  Blood pressure (!) 144/79, pulse 88, temperature 98.3 F (36.8 C), temperature source Oral, resp. rate 20, height 5\' 9"  (1.753 m), weight 178 lb 11.2 oz (81.1 kg), SpO2 98 %.  LABORATORY DATA: Lab Results  Component Value Date   WBC 6.4  02/04/2019   HGB 14.5 02/04/2019   HCT 44.5 02/04/2019   MCV 85.7 02/04/2019   PLT 156 02/04/2019      Chemistry      Component Value Date/Time   NA 140 01/22/2018 0931   NA 140 01/23/2017 0958   K 4.0 01/22/2018 0931   K 4.0 01/23/2017 0958   CL 105 01/22/2018 0931   CL 103 11/21/2012 0930   CO2 29 01/22/2018 0931   CO2 28 01/23/2017 0958   BUN 18 01/22/2018 0931   BUN 17.0 01/23/2017 0958   CREATININE 1.20 01/22/2018 0931   CREATININE 1.3 01/23/2017 0958      Component Value Date/Time   CALCIUM 10.2 01/22/2018 0931   CALCIUM 10.2 01/23/2017 0958   ALKPHOS 59 01/22/2018 0931   ALKPHOS 61 01/23/2017 0958   AST 19 01/22/2018 0931   AST 20 01/23/2017 0958   ALT 16 01/22/2018 0931   ALT 21 01/23/2017 0958   BILITOT 0.8 01/22/2018 0931   BILITOT 1.14 01/23/2017 0958       RADIOGRAPHIC STUDIES: No results found.  ASSESSMENT AND PLAN:  This is a very pleasant 78 years old white male with history of large B-cell non-Hodgkin lymphoma presented with a large mass close to the head of the pancreas status post systemic chemotherapy with CHOP/Rituxan for 4 cycles in addition to hyperthermic therapy in [place] with almost complete response of his disease. The patient is currently on observation and he is feeling fine. CBC today is unremarkable.  Comprehensive metabolic panel and LDH are still pending. I recommended for the patient to continue on observation with repeat blood work in 1 year. The patient voices understanding of current disease status and treatment options and is in agreement with the current care plan. All questions were answered. The patient knows to call the clinic with any problems, questions or concerns. We can certainly see the patient much sooner if necessary. I spent 10 minutes counseling the patient face to face. The total time spent in the appointment was 15 minutes.  Disclaimer: This note was dictated with voice recognition software. Similar sounding words  can inadvertently be transcribed and may not be corrected upon review.

## 2019-02-04 NOTE — Telephone Encounter (Signed)
Mailed letter for follow up in one year  - per 5/19 los.

## 2019-03-25 DIAGNOSIS — H43813 Vitreous degeneration, bilateral: Secondary | ICD-10-CM | POA: Diagnosis not present

## 2019-03-25 DIAGNOSIS — H43391 Other vitreous opacities, right eye: Secondary | ICD-10-CM | POA: Diagnosis not present

## 2019-03-25 DIAGNOSIS — H3582 Retinal ischemia: Secondary | ICD-10-CM | POA: Diagnosis not present

## 2019-03-25 DIAGNOSIS — H34813 Central retinal vein occlusion, bilateral, with macular edema: Secondary | ICD-10-CM | POA: Diagnosis not present

## 2019-04-15 DIAGNOSIS — E559 Vitamin D deficiency, unspecified: Secondary | ICD-10-CM | POA: Diagnosis not present

## 2019-04-15 DIAGNOSIS — N401 Enlarged prostate with lower urinary tract symptoms: Secondary | ICD-10-CM | POA: Diagnosis not present

## 2019-04-15 DIAGNOSIS — Z1389 Encounter for screening for other disorder: Secondary | ICD-10-CM | POA: Diagnosis not present

## 2019-04-15 DIAGNOSIS — D696 Thrombocytopenia, unspecified: Secondary | ICD-10-CM | POA: Diagnosis not present

## 2019-04-15 DIAGNOSIS — R7301 Impaired fasting glucose: Secondary | ICD-10-CM | POA: Diagnosis not present

## 2019-04-15 DIAGNOSIS — Z8572 Personal history of non-Hodgkin lymphomas: Secondary | ICD-10-CM | POA: Diagnosis not present

## 2019-04-15 DIAGNOSIS — Z0000 Encounter for general adult medical examination without abnormal findings: Secondary | ICD-10-CM | POA: Diagnosis not present

## 2019-04-15 DIAGNOSIS — H348132 Central retinal vein occlusion, bilateral, stable: Secondary | ICD-10-CM | POA: Diagnosis not present

## 2019-04-15 DIAGNOSIS — I10 Essential (primary) hypertension: Secondary | ICD-10-CM | POA: Diagnosis not present

## 2019-04-30 DIAGNOSIS — I10 Essential (primary) hypertension: Secondary | ICD-10-CM | POA: Diagnosis not present

## 2019-04-30 DIAGNOSIS — R7301 Impaired fasting glucose: Secondary | ICD-10-CM | POA: Diagnosis not present

## 2019-04-30 DIAGNOSIS — E559 Vitamin D deficiency, unspecified: Secondary | ICD-10-CM | POA: Diagnosis not present

## 2019-06-04 DIAGNOSIS — E860 Dehydration: Secondary | ICD-10-CM | POA: Diagnosis not present

## 2019-06-17 DIAGNOSIS — H34813 Central retinal vein occlusion, bilateral, with macular edema: Secondary | ICD-10-CM | POA: Diagnosis not present

## 2019-06-17 DIAGNOSIS — H43813 Vitreous degeneration, bilateral: Secondary | ICD-10-CM | POA: Diagnosis not present

## 2019-06-17 DIAGNOSIS — H3582 Retinal ischemia: Secondary | ICD-10-CM | POA: Diagnosis not present

## 2019-06-17 DIAGNOSIS — H43391 Other vitreous opacities, right eye: Secondary | ICD-10-CM | POA: Diagnosis not present

## 2019-07-16 DIAGNOSIS — M2061 Acquired deformities of toe(s), unspecified, right foot: Secondary | ICD-10-CM | POA: Diagnosis not present

## 2019-07-16 DIAGNOSIS — M21611 Bunion of right foot: Secondary | ICD-10-CM | POA: Diagnosis not present

## 2019-09-23 DIAGNOSIS — H43391 Other vitreous opacities, right eye: Secondary | ICD-10-CM | POA: Diagnosis not present

## 2019-09-23 DIAGNOSIS — H3582 Retinal ischemia: Secondary | ICD-10-CM | POA: Diagnosis not present

## 2019-09-23 DIAGNOSIS — H34813 Central retinal vein occlusion, bilateral, with macular edema: Secondary | ICD-10-CM | POA: Diagnosis not present

## 2019-09-23 DIAGNOSIS — H43813 Vitreous degeneration, bilateral: Secondary | ICD-10-CM | POA: Diagnosis not present

## 2019-10-04 ENCOUNTER — Emergency Department (HOSPITAL_COMMUNITY): Payer: Medicare HMO

## 2019-10-04 ENCOUNTER — Encounter (HOSPITAL_COMMUNITY): Payer: Self-pay

## 2019-10-04 ENCOUNTER — Inpatient Hospital Stay (HOSPITAL_COMMUNITY)
Admission: EM | Admit: 2019-10-04 | Discharge: 2019-10-11 | DRG: 481 | Disposition: A | Discharge status: Skilled Nursing Facility | Payer: Medicare HMO | Attending: Internal Medicine | Admitting: Internal Medicine

## 2019-10-04 ENCOUNTER — Other Ambulatory Visit: Payer: Self-pay

## 2019-10-04 DIAGNOSIS — Z8572 Personal history of non-Hodgkin lymphomas: Secondary | ICD-10-CM

## 2019-10-04 DIAGNOSIS — Y92009 Unspecified place in unspecified non-institutional (private) residence as the place of occurrence of the external cause: Secondary | ICD-10-CM

## 2019-10-04 DIAGNOSIS — Z91041 Radiographic dye allergy status: Secondary | ICD-10-CM | POA: Diagnosis not present

## 2019-10-04 DIAGNOSIS — S72009A Fracture of unspecified part of neck of unspecified femur, initial encounter for closed fracture: Secondary | ICD-10-CM

## 2019-10-04 DIAGNOSIS — W19XXXA Unspecified fall, initial encounter: Secondary | ICD-10-CM

## 2019-10-04 DIAGNOSIS — Z882 Allergy status to sulfonamides status: Secondary | ICD-10-CM | POA: Diagnosis not present

## 2019-10-04 DIAGNOSIS — M25462 Effusion, left knee: Secondary | ICD-10-CM | POA: Diagnosis not present

## 2019-10-04 DIAGNOSIS — C8251 Diffuse follicle center lymphoma, lymph nodes of head, face, and neck: Secondary | ICD-10-CM | POA: Diagnosis not present

## 2019-10-04 DIAGNOSIS — S72122A Displaced fracture of lesser trochanter of left femur, initial encounter for closed fracture: Secondary | ICD-10-CM | POA: Diagnosis present

## 2019-10-04 DIAGNOSIS — M19012 Primary osteoarthritis, left shoulder: Secondary | ICD-10-CM | POA: Diagnosis not present

## 2019-10-04 DIAGNOSIS — Z8781 Personal history of (healed) traumatic fracture: Secondary | ICD-10-CM

## 2019-10-04 DIAGNOSIS — S72122D Displaced fracture of lesser trochanter of left femur, subsequent encounter for closed fracture with routine healing: Secondary | ICD-10-CM | POA: Diagnosis not present

## 2019-10-04 DIAGNOSIS — W010XXA Fall on same level from slipping, tripping and stumbling without subsequent striking against object, initial encounter: Secondary | ICD-10-CM | POA: Diagnosis present

## 2019-10-04 DIAGNOSIS — S72002A Fracture of unspecified part of neck of left femur, initial encounter for closed fracture: Secondary | ICD-10-CM | POA: Diagnosis present

## 2019-10-04 DIAGNOSIS — I10 Essential (primary) hypertension: Secondary | ICD-10-CM | POA: Diagnosis present

## 2019-10-04 DIAGNOSIS — Z79899 Other long term (current) drug therapy: Secondary | ICD-10-CM

## 2019-10-04 DIAGNOSIS — C8590 Non-Hodgkin lymphoma, unspecified, unspecified site: Secondary | ICD-10-CM | POA: Diagnosis not present

## 2019-10-04 DIAGNOSIS — Z885 Allergy status to narcotic agent status: Secondary | ICD-10-CM

## 2019-10-04 DIAGNOSIS — D696 Thrombocytopenia, unspecified: Secondary | ICD-10-CM | POA: Diagnosis present

## 2019-10-04 DIAGNOSIS — Z20822 Contact with and (suspected) exposure to covid-19: Secondary | ICD-10-CM | POA: Diagnosis not present

## 2019-10-04 DIAGNOSIS — Z751 Person awaiting admission to adequate facility elsewhere: Secondary | ICD-10-CM

## 2019-10-04 DIAGNOSIS — S72142A Displaced intertrochanteric fracture of left femur, initial encounter for closed fracture: Secondary | ICD-10-CM | POA: Diagnosis not present

## 2019-10-04 DIAGNOSIS — M25552 Pain in left hip: Secondary | ICD-10-CM | POA: Diagnosis not present

## 2019-10-04 DIAGNOSIS — R5381 Other malaise: Secondary | ICD-10-CM | POA: Diagnosis not present

## 2019-10-04 DIAGNOSIS — Z03818 Encounter for observation for suspected exposure to other biological agents ruled out: Secondary | ICD-10-CM | POA: Diagnosis not present

## 2019-10-04 DIAGNOSIS — M6281 Muscle weakness (generalized): Secondary | ICD-10-CM | POA: Diagnosis not present

## 2019-10-04 DIAGNOSIS — E559 Vitamin D deficiency, unspecified: Secondary | ICD-10-CM | POA: Diagnosis not present

## 2019-10-04 DIAGNOSIS — I700 Atherosclerosis of aorta: Secondary | ICD-10-CM | POA: Diagnosis not present

## 2019-10-04 DIAGNOSIS — Z09 Encounter for follow-up examination after completed treatment for conditions other than malignant neoplasm: Secondary | ICD-10-CM | POA: Diagnosis not present

## 2019-10-04 DIAGNOSIS — I452 Bifascicular block: Secondary | ICD-10-CM | POA: Diagnosis present

## 2019-10-04 DIAGNOSIS — R52 Pain, unspecified: Secondary | ICD-10-CM | POA: Diagnosis not present

## 2019-10-04 DIAGNOSIS — K5900 Constipation, unspecified: Secondary | ICD-10-CM | POA: Diagnosis not present

## 2019-10-04 DIAGNOSIS — R2681 Unsteadiness on feet: Secondary | ICD-10-CM | POA: Diagnosis not present

## 2019-10-04 DIAGNOSIS — Z01818 Encounter for other preprocedural examination: Secondary | ICD-10-CM | POA: Diagnosis not present

## 2019-10-04 DIAGNOSIS — Z419 Encounter for procedure for purposes other than remedying health state, unspecified: Secondary | ICD-10-CM

## 2019-10-04 DIAGNOSIS — R2689 Other abnormalities of gait and mobility: Secondary | ICD-10-CM | POA: Diagnosis not present

## 2019-10-04 DIAGNOSIS — Z7401 Bed confinement status: Secondary | ICD-10-CM | POA: Diagnosis not present

## 2019-10-04 DIAGNOSIS — M2550 Pain in unspecified joint: Secondary | ICD-10-CM | POA: Diagnosis not present

## 2019-10-04 LAB — CBC WITH DIFFERENTIAL/PLATELET
Abs Immature Granulocytes: 0.02 10*3/uL (ref 0.00–0.07)
Basophils Absolute: 0 10*3/uL (ref 0.0–0.1)
Basophils Relative: 1 %
Eosinophils Absolute: 0.1 10*3/uL (ref 0.0–0.5)
Eosinophils Relative: 1 %
HCT: 48.4 % (ref 39.0–52.0)
Hemoglobin: 15.6 g/dL (ref 13.0–17.0)
Immature Granulocytes: 0 %
Lymphocytes Relative: 18 %
Lymphs Abs: 1.5 10*3/uL (ref 0.7–4.0)
MCH: 28.8 pg (ref 26.0–34.0)
MCHC: 32.2 g/dL (ref 30.0–36.0)
MCV: 89.3 fL (ref 80.0–100.0)
Monocytes Absolute: 0.4 10*3/uL (ref 0.1–1.0)
Monocytes Relative: 5 %
Neutro Abs: 6.3 10*3/uL (ref 1.7–7.7)
Neutrophils Relative %: 75 %
Platelets: 180 10*3/uL (ref 150–400)
RBC: 5.42 MIL/uL (ref 4.22–5.81)
RDW: 14 % (ref 11.5–15.5)
WBC: 8.3 10*3/uL (ref 4.0–10.5)
nRBC: 0 % (ref 0.0–0.2)

## 2019-10-04 LAB — COMPREHENSIVE METABOLIC PANEL
ALT: 20 U/L (ref 0–44)
AST: 22 U/L (ref 15–41)
Albumin: 4.1 g/dL (ref 3.5–5.0)
Alkaline Phosphatase: 57 U/L (ref 38–126)
Anion gap: 12 (ref 5–15)
BUN: 16 mg/dL (ref 8–23)
CO2: 20 mmol/L — ABNORMAL LOW (ref 22–32)
Calcium: 9.1 mg/dL (ref 8.9–10.3)
Chloride: 104 mmol/L (ref 98–111)
Creatinine, Ser: 1.23 mg/dL (ref 0.61–1.24)
GFR calc Af Amer: >60 mL/min (ref >60)
GFR calc non Af Amer: 55 mL/min — ABNORMAL LOW (ref >60)
Glucose, Bld: 156 mg/dL — ABNORMAL HIGH (ref 70–99)
Potassium: 3.8 mmol/L (ref 3.5–5.1)
Sodium: 136 mmol/L (ref 135–145)
Total Bilirubin: 1.4 mg/dL — ABNORMAL HIGH (ref 0.3–1.2)
Total Protein: 6.9 g/dL (ref 6.5–8.1)

## 2019-10-04 LAB — RESPIRATORY PANEL BY RT PCR (FLU A&B, COVID)
Influenza A by PCR: NEGATIVE
Influenza B by PCR: NEGATIVE
SARS Coronavirus 2 by RT PCR: NEGATIVE

## 2019-10-04 LAB — MRSA PCR SCREENING: MRSA by PCR: NEGATIVE

## 2019-10-04 MED ORDER — SODIUM CHLORIDE 0.9 % IV SOLN: INTRAVENOUS | Status: DC

## 2019-10-04 MED ORDER — FENTANYL CITRATE (PF) 100 MCG/2ML IJ SOLN
25.0000 ug | INTRAMUSCULAR | Status: DC | PRN
  Administered 2019-10-04 – 2019-10-05 (×4): 25 ug via INTRAVENOUS
  Filled 2019-10-04 (×4): qty 2

## 2019-10-04 MED ORDER — ENOXAPARIN SODIUM 40 MG/0.4ML ~~LOC~~ SOLN
40.0000 mg | SUBCUTANEOUS | Status: DC
  Administered 2019-10-04 – 2019-10-05 (×2): 40 mg via SUBCUTANEOUS
  Filled 2019-10-04 (×3): qty 0.4

## 2019-10-04 MED ORDER — ONDANSETRON HCL 4 MG/2ML IJ SOLN
4.0000 mg | Freq: Four times a day (QID) | INTRAMUSCULAR | Status: DC | PRN
  Administered 2019-10-04: 4 mg via INTRAVENOUS
  Filled 2019-10-04: qty 2

## 2019-10-04 MED ORDER — FENTANYL CITRATE (PF) 100 MCG/2ML IJ SOLN
50.0000 ug | Freq: Once | INTRAMUSCULAR | Status: AC
  Administered 2019-10-04: 50 ug via INTRAVENOUS
  Filled 2019-10-04: qty 2

## 2019-10-04 MED ORDER — MORPHINE SULFATE (PF) 2 MG/ML IV SOLN
1.0000 mg | INTRAVENOUS | Status: DC | PRN
  Filled 2019-10-04: qty 1

## 2019-10-04 MED ORDER — ACETAMINOPHEN 325 MG PO TABS
650.0000 mg | ORAL_TABLET | ORAL | Status: DC | PRN
  Administered 2019-10-04 – 2019-10-11 (×8): 650 mg via ORAL
  Filled 2019-10-04 (×11): qty 2

## 2019-10-04 NOTE — ED Notes (Signed)
ED TO INPATIENT HANDOFF REPORT  ED Nurse Name and Phone #: Phyllistine Domingos [phone]  S Name/Age/Gender [name] [name] 79 y.o. adult Room/Bed: 037C/037C  Code Status   Code Status: Not on file  Home/SNF/Other Home Patient oriented to: self, place, time and situation Is this baseline? Yes   Triage Complete: Triage complete  Chief Complaint Closed left hip fracture ([place]) [S72.002A]  Triage Note Per [facility]: Pt lost balance when standing, landed on left hip and dislocated it. Pt was given 100 mcg of fentanyl and 16.5 mg of ketamine IV with EMS. This helped with pts pain. Pt alert and oriented X 4 in triage. PMS is intact.     Allergies Allergies  Allergen Reactions  . Morphine And Related Anxiety    HALLUCINATIONS  . Iohexol      Desc: pt. broke out in red pimples 3 days after his ct scan w/contrast, ok with pre-meds   . Sulfa Antibiotics Hives    Level of Care/Admitting Diagnosis ED Disposition    ED Disposition Condition Comment   Admit  Hospital Area: [facility] [[id number]]  Level of Care: Med-Surg [16]  Covid Evaluation: Asymptomatic Screening Protocol (No Symptoms)  Diagnosis: Closed left hip fracture [facility]) [id number]  Admitting Physician: Hosie Poisson [[id number]]  Attending Physician: Hosie Poisson [[id number]]  Estimated length of stay: past midnight tomorrow  Certification:: I certify this patient will need inpatient services for at least 2 midnights       B Medical/Surgery History Past Medical History:  Diagnosis Date  . Femur fracture ([place])   . Hip fracture ([place])   . Hypertension   . Malignant neoplasm of pancreas, part unspecified   . Other malignant lymphomas, unspecified site, extranodal and solid organ sites    Past Surgical History:  Procedure Laterality Date  . FEMUR SURGERY Bilateral   . LEG SURGERY Left   . SHOULDER SURGERY Left      A IV Location/Drains/Wounds Patient Lines/Drains/Airways Status   Active Line/Drains/Airways    Name:    Placement date:   Placement time:   Site:   Days:   Implanted Port 12/11/07   12/11/07    --    --   4315   Peripheral IV 10/04/19 Right Antecubital   10/04/19    --    Antecubital   less than 1          Intake/Output Last 24 hours No intake or output data in the 24 hours ending 10/04/19 1940  Labs/Imaging Results for orders placed or performed during the hospital encounter of 10/04/19 (from the past 48 hour(s))  CBC with Differential     Status: None   Collection Time: 10/04/19  3:41 PM  Result Value Ref Range   WBC 8.3 4.0 - 10.5 K/uL   RBC 5.42 4.22 - 5.81 MIL/uL   Hemoglobin 15.6 13.0 - 17.0 g/dL   HCT 48.4 39.0 - 52.0 %   MCV 89.3 80.0 - 100.0 fL   MCH 28.8 26.0 - 34.0 pg   MCHC 32.2 30.0 - 36.0 g/dL   RDW 14.0 11.5 - 15.5 %   Platelets 180 150 - 400 K/uL   nRBC 0.0 0.0 - 0.2 %   Neutrophils Relative % 75 %   Neutro Abs 6.3 1.7 - 7.7 K/uL   Lymphocytes Relative 18 %   Lymphs Abs 1.5 0.7 - 4.0 K/uL   Monocytes Relative 5 %   Monocytes Absolute 0.4 0.1 - 1.0 K/uL   Eosinophils Relative 1 %  Eosinophils Absolute 0.1 0.0 - 0.5 K/uL   Basophils Relative 1 %   Basophils Absolute 0.0 0.0 - 0.1 K/uL   Immature Granulocytes 0 %   Abs Immature Granulocytes 0.02 0.00 - 0.07 K/uL    Comment: Performed at [facility], [place] [address] [place], [place] [address]  Comprehensive metabolic panel     Status: Abnormal   Collection Time: 10/04/19  3:41 PM  Result Value Ref Range   Sodium 136 135 - 145 mmol/L   Potassium 3.8 3.5 - 5.1 mmol/L   Chloride 104 98 - 111 mmol/L   CO2 20 (L) 22 - 32 mmol/L   Glucose, Bld 156 (H) 70 - 99 mg/dL   BUN 16 8 - 23 mg/dL   Creatinine, Ser 1.23 0.61 - 1.24 mg/dL   Calcium 9.1 8.9 - 10.3 mg/dL   Total Protein 6.9 6.5 - 8.1 g/dL   Albumin 4.1 3.5 - 5.0 g/dL   AST 22 15 - 41 U/L   ALT 20 0 - 44 U/L   Alkaline Phosphatase 57 38 - 126 U/L   Total Bilirubin 1.4 (H) 0.3 - 1.2 mg/dL   GFR calc non Af Amer 55 (L) >60 mL/min   GFR calc Af Amer  >60 >60 mL/min   Anion gap 12 5 - 15    Comment: Performed at [facility] [address] [place], [place] [address]  Respiratory Panel by RT PCR (Flu A&B, Covid) - Nasopharyngeal Swab     Status: None   Collection Time: 10/04/19  3:41 PM   Specimen: Nasopharyngeal Swab  Result Value Ref Range   SARS Coronavirus 2 by RT PCR NEGATIVE NEGATIVE    Comment: (NOTE) SARS-CoV-2 target nucleic acids are NOT DETECTED. The SARS-CoV-2 RNA is generally detectable in upper respiratoy specimens during the acute phase of infection. The lowest concentration of SARS-CoV-2 viral copies this assay can detect is 131 copies/mL. A negative result does not preclude SARS-Cov-2 infection and should not be used as the sole basis for treatment or other patient management decisions. A negative result may occur with  improper specimen collection/handling, submission of specimen other than nasopharyngeal swab, presence of viral mutation(s) within the areas targeted by this assay, and inadequate number of viral copies (<131 copies/mL). A negative result must be combined with clinical observations, patient history, and epidemiological information. The expected result is Negative. Fact Sheet for Patients:  PinkCheek.be Fact Sheet for Healthcare Providers:  GravelBags.it This test is not yet ap proved or cleared by the [place] FDA and  has been authorized for detection and/or diagnosis of SARS-CoV-2 by FDA under an Emergency Use Authorization (EUA). This EUA will remain  in effect (meaning this test can be used) for the duration of the COVID-19 declaration under Section 564(b)(1) of the Act, 21 U.S.C. section 360bbb-3(b)(1), unless the authorization is terminated or revoked sooner.    Influenza A by PCR NEGATIVE NEGATIVE   Influenza B by PCR NEGATIVE NEGATIVE    Comment: (NOTE) The Xpert Xpress SARS-CoV-2/FLU/RSV assay is intended as an aid in   the diagnosis of influenza from Nasopharyngeal swab specimens and  should not be used as a sole basis for treatment. Nasal washings and  aspirates are unacceptable for Xpert Xpress SARS-CoV-2/FLU/RSV  testing. Fact Sheet for Patients: PinkCheek.be Fact Sheet for Healthcare Providers: GravelBags.it This test is not yet approved or cleared by the [place] FDA and  has been authorized for detection and/or diagnosis of SARS-CoV-2 by  FDA under an Emergency Use Authorization (EUA). This EUA will remain  in effect (meaning this test can be used) for the duration of the  Covid-19 declaration under Section 564(b)(1) of the Act, 21  U.S.C. section 360bbb-3(b)(1), unless the authorization is  terminated or revoked. Performed at [facility], [place] [address] [place], [place] [address]    DG Chest Portable 1 View  Result Date: 10/04/2019 CLINICAL DATA:  Hip fracture, preoperative EXAM: PORTABLE CHEST 1 VIEW COMPARISON:  CT chest 05/31/2012, radiograph 12/08/2008 FINDINGS: Right subclavian approach Port-A-Cath tip terminates at the right atrium. The aorta is calcified. The remaining cardiomediastinal contours are unremarkable. Chronic elevation of the right hemidiaphragm is similar to multiple remote comparison is. There are diffusely coarsened interstitial changes which could reflect chronic and slightly worsening interstitial fibrosis seen in more early stages on comparison CT. Partially visualized hardware in the left humerus. Degenerative changes are present in the imaged spine and shoulders, left greater than right. Telemetry leads overlie the chest. IMPRESSION: No acute cardiopulmonary abnormality. Diffusely coarsened interstitial changes which could reflect chronic and slightly worsening interstitial fibrotic features seen in more early stages on comparison CT. Aortic Atherosclerosis (ICD10-I70.0). Electronically Signed   By: Lovena Le M.D.   On: 10/04/2019 16:40   DG Knee Left Port  Result Date: 10/04/2019 CLINICAL DATA:  Status post fall. EXAM: PORTABLE LEFT KNEE - 1-2 VIEW COMPARISON:  None. FINDINGS: No evidence of fracture, or dislocation. Small suprapatellar joint effusion. Intact distal tip of left femoral intramedullary rod. Lateral suprapatellar soft tissue swelling. IMPRESSION: 1. No acute fracture or dislocation identified about the left knee. 2. Small suprapatellar joint effusion. Electronically Signed   By: Fidela Salisbury M.D.   On: 10/04/2019 18:21   DG Hip Port Unilat W or Wo Pelvis 1 View Left  Result Date: 10/04/2019 CLINICAL DATA:  Fall, left hip deformity EXAM: DG HIP (WITH OR WITHOUT PELVIS) 1V PORT LEFT COMPARISON:  CT abdomen pelvis 01/06/2015 FINDINGS: There is a comminuted intertrochanteric fracture of the left femur with pronounced fragmentation and partial retraction of the lesser trochanter. Extensive adjacent soft tissue swelling. Femoral heads remain normally located. Prior bilateral femoral intramedullary nail placements are present. The proximal extent of a right intramedullary nail secured by fixation screws the left demonstrating screw tract lucency with absent fixation screws. Partially visualized remote posttraumatic deformity of the proximal right femur. Remaining bones of the pelvis are intact without abnormal diastatic widening of the symphysis or SI joints. Portions of the sacrum are obscured by overlying bowel gas. Likely remote posttraumatic deformity of the right inferior pubic ramus. The osseous structures appear diffusely demineralized which may limit detection of small or nondisplaced fractures. IMPRESSION: Comminuted intertrochanteric fracture of the left femur with pronounced fragmentation and partial retraction of the lesser trochanter. Extensive adjacent soft tissue swelling. Femoral heads remain normally located. Remaining bones of the pelvis remain intact and congruent. Prior  bilateral femoral intramedullary nail placement. Electronically Signed   By: Lovena Le M.D.   On: 10/04/2019 16:16    Pending Labs Unresulted Labs (From admission, onward)    Start     Ordered   10/05/19 0500  CBC daily once in am  Tomorrow morning,   R     10/04/19 1939   10/05/19 [id number]  Basic metabolic panel once in am  Tomorrow morning,   R     10/04/19 1939   10/05/19 0500  Magnesium  Tomorrow morning,   R  10/04/19 1939   Signed and Held  Creatinine, serum  (enoxaparin (LOVENOX)    CrCl >/= 30 ml/min)  Weekly,   R    Comments: while on enoxaparin therapy    Signed and Held          Vitals/Pain Today's Vitals   10/04/19 1700 10/04/19 1730 10/04/19 1830 10/04/19 1900  BP: 136/69 131/83 132/74 (!) 141/82  Pulse: 77 70 75 80  Resp: 17 15 14 15   Temp:      TempSrc:      SpO2: 93% 97% 97% 98%  PainSc:        Isolation Precautions No active isolations  Medications Medications  0.9 %  sodium chloride infusion (has no administration in time range)  acetaminophen (TYLENOL) tablet 650 mg (650 mg Oral Given 10/04/19 1859)  ondansetron (ZOFRAN) injection 4 mg (4 mg Intravenous Given 10/04/19 1854)  fentaNYL (SUBLIMAZE) injection 25 mcg (has no administration in time range)  fentaNYL (SUBLIMAZE) injection 50 mcg (50 mcg Intravenous Given 10/04/19 1542)    Mobility non-ambulatory High fall risk   Focused Assessments ..   R Recommendations: See Admitting Provider Note  Report given to:   Additional Notes:

## 2019-10-04 NOTE — H&P (Signed)
History and Physical    [name] [id number] DOB: [birth year] DOA: 10/04/2019  PCP: [name], MD  Patient coming from: Home.   I have personally briefly reviewed patient's old medical records in [place]  Chief Complaint: left hip pain from a mechanical fall.   HPI: [name] [name] is a 79 y.o. adult with medical history significant of  NHL in remission, hypertension, previous hip repair surgeries, reports was using foot warmer and got up to move , tripped and fell on the left side . He reports severe pain in the left hip and unable to ambulate. He was brought o ED by his daughters. Pt in general denies any complaints other than pain in the left hip. He denies chest pain, sob, cough, fever, chills, nausea, vomiting , abdominal pain, diarrhea, dysuria, denies exposure to COVID 19, No sick contacts.   ED Course: on arrival to ED, he was found to be afebrile, normotensive. Labs show sodium of 136, bicarb of 20, normal cbc,. Negative respiratory panel.  COVID 19 Screening test is negative.  CXR  Shows Diffusely coarsened interstitial changes which could reflect chronic and slightly worsening interstitial fibrotic features seen in more early stages on comparison CT.  X rays of the hip show Comminuted intertrochanteric fracture of the left femur with pronounced fragmentation and partial retraction of the lesser trochanter. Extensive adjacent soft tissue swelling. Remaining bones of the pelvis remain intact and congruent. Prior bilateral femoral intramedullary nail placement.  X rays of the knee No acute fracture or dislocation identified about the left knee. Small suprapatellar joint effusion.  He was referred to [facility] for admission for the evaluation and management of left femur fracture. Orthopedics consulted .    Review of Systems: As per HPI otherwise "All others reviewed and are negative," .  Past Medical History:  Diagnosis Date  . Femur fracture ([place])   . Hip  fracture ([place])   . Hypertension   . Malignant neoplasm of pancreas, part unspecified   . Other malignant lymphomas, unspecified site, extranodal and solid organ sites     Past Surgical History:  Procedure Laterality Date  . FEMUR SURGERY Bilateral   . LEG SURGERY Left   . SHOULDER SURGERY Left      reports that she has never smoked. She has never used smokeless tobacco. She reports that she does not drink alcohol or use drugs.  Allergies  Allergen Reactions  . Morphine And Related Anxiety    HALLUCINATIONS  . Iohexol      Desc: pt. broke out in red pimples 3 days after his ct scan w/contrast, ok with pre-meds   . Sulfa Antibiotics Hives    No family history of lymphoma.    Prior to Admission medications   Medication Sig Start Date End Date Taking? Authorizing Provider  Aflibercept (EYLEA) 2 MG/0.05ML SOLN Inject 0.5 mLs into the eye as directed. Take every 6 wks in eye doctor office.    Yes [provider]  Ascorbic Acid (VITAMIN C) 1000 MG tablet Take 2,000 mg by mouth daily.     Yes [provider]  cholecalciferol (VITAMIN D) 1000 UNITS tablet Take 15,000 Units by mouth daily.    Yes [provider]  lisinopril-hydrochlorothiazide (PRINZIDE,ZESTORETIC) 20-12.5 MG per tablet Take 2 tablets by mouth daily.    Yes [provider]  Multiple Vitamin (MULTIVITAMIN) tablet Take 1 tablet by mouth daily.    Yes [provider]  Probiotic Product (PROBIOTIC FORMULA PO) Take  1 tablet by mouth daily.     Yes [provider]  Turmeric (QC TUMERIC COMPLEX PO) Take 1 tablet by mouth daily.   Yes [provider]  Zinc 30 MG TABS Take 1 tablet by mouth every morning.   Yes [provider]    Physical Exam: Vitals:   10/04/19 1700 10/04/19 1730 10/04/19 1830 10/04/19 1900  BP: 136/69 131/83 132/74 (!) 141/82  Pulse: 77 70 75 80  Resp: 17 15 14 15   Temp:      TempSrc:      SpO2: 93% 97% 97% 98%     Constitutional: NAD, calm, comfortable, not in distress.  Vitals:   10/04/19 1700 10/04/19 1730 10/04/19 1830 10/04/19 1900  BP: 136/69 131/83 132/74 (!) 141/82  Pulse: 77 70 75 80  Resp: 17 15 14 15   Temp:      TempSrc:      SpO2: 93% 97% 97% 98%   Eyes: PERRL, lids and conjunctivae normal ENMT: Mucous membranes are dry.  Neck: normal, supple,  Respiratory: clear to auscultation bilaterally, no wheezing, no crackles. Normal respiratory effort. No accessory muscle use.  Cardiovascular: Regular rate and rhythm, no murmurs. No extremity edema. 2+ pedal pulses. Abdomen: no tenderness, no masses palpated.  Bowel sounds positive.  Musculoskeletal: no clubbing / cyanosis. LEFT LOWER extremity is  shortened.  Skin: no rashes, lesions, ulcers. No induration Neurologic: CN 2-12 grossly intact.  Psychiatric: Normal judgment and insight. Alert and oriented x 3. Normal mood.   ( Labs on Admission: I have personally reviewed following labs and imaging studies  CBC: Recent Labs  Lab 10/04/19 1541  WBC 8.3  NEUTROABS 6.3  HGB 15.6  HCT 48.4  MCV 89.3  PLT 99991111   Basic Metabolic Panel: Recent Labs  Lab 10/04/19 1541  NA 136  K 3.8  CL 104  CO2 20*  GLUCOSE 156*  BUN 16  CREATININE 1.23  CALCIUM 9.1   GFR: CrCl cannot be calculated (Unknown ideal weight.). Liver Function Tests: Recent Labs  Lab 10/04/19 1541  AST 22  ALT 20  ALKPHOS 57  BILITOT 1.4*  PROT 6.9  ALBUMIN 4.1   No results for input(s): LIPASE, AMYLASE in the last 168 hours. No results for input(s): AMMONIA in the last 168 hours. Coagulation Profile: No results for input(s): INR, PROTIME in the last 168 hours. Cardiac Enzymes: No results for input(s): CKTOTAL, CKMB, CKMBINDEX, TROPONINI in the last 168 hours. BNP (last 3 results) No results for input(s): PROBNP in the last 8760 hours. HbA1C: No results for input(s): HGBA1C in the last 72 hours. CBG: No results for input(s): GLUCAP in the last  168 hours. Lipid Profile: No results for input(s): CHOL, HDL, LDLCALC, TRIG, CHOLHDL, LDLDIRECT in the last 72 hours. Thyroid Function Tests: No results for input(s): TSH, T4TOTAL, FREET4, T3FREE, THYROIDAB in the last 72 hours. Anemia Panel: No results for input(s): VITAMINB12, FOLATE, FERRITIN, TIBC, IRON, RETICCTPCT in the last 72 hours. Urine analysis: No results found for: COLORURINE, APPEARANCEUR, [place], [place], [place], [place], [place], [place], [place], [place], NITRITE, LEUKOCYTESUR  Radiological Exams on Admission: DG Chest Portable 1 View  Result Date: 10/04/2019 CLINICAL DATA:  Hip fracture, preoperative EXAM: PORTABLE CHEST 1 VIEW COMPARISON:  CT chest 05/31/2012, radiograph 12/08/2008 FINDINGS: Right subclavian approach Port-A-Cath tip terminates at the right atrium. The aorta is calcified. The remaining cardiomediastinal contours are unremarkable. Chronic elevation of the right hemidiaphragm is similar to multiple remote comparison is. There are diffusely coarsened interstitial changes  which could reflect chronic and slightly worsening interstitial fibrosis seen in more early stages on comparison CT. Partially visualized hardware in the left humerus. Degenerative changes are present in the imaged spine and shoulders, left greater than right. Telemetry leads overlie the chest. IMPRESSION: No acute cardiopulmonary abnormality. Diffusely coarsened interstitial changes which could reflect chronic and slightly worsening interstitial fibrotic features seen in more early stages on comparison CT. Aortic Atherosclerosis (ICD10-I70.0). Electronically Signed   By: Lovena Le M.D.   On: 10/04/2019 16:40   DG Knee Left Port  Result Date: 10/04/2019 CLINICAL DATA:  Status post fall. EXAM: PORTABLE LEFT KNEE - 1-2 VIEW COMPARISON:  None. FINDINGS: No evidence of fracture, or dislocation. Small suprapatellar joint effusion. Intact distal tip of left femoral intramedullary rod.  Lateral suprapatellar soft tissue swelling. IMPRESSION: 1. No acute fracture or dislocation identified about the left knee. 2. Small suprapatellar joint effusion. Electronically Signed   By: Fidela Salisbury M.D.   On: 10/04/2019 18:21   DG Hip Port Unilat W or Wo Pelvis 1 View Left  Result Date: 10/04/2019 CLINICAL DATA:  Fall, left hip deformity EXAM: DG HIP (WITH OR WITHOUT PELVIS) 1V PORT LEFT COMPARISON:  CT abdomen pelvis 01/06/2015 FINDINGS: There is a comminuted intertrochanteric fracture of the left femur with pronounced fragmentation and partial retraction of the lesser trochanter. Extensive adjacent soft tissue swelling. Femoral heads remain normally located. Prior bilateral femoral intramedullary nail placements are present. The proximal extent of a right intramedullary nail secured by fixation screws the left demonstrating screw tract lucency with absent fixation screws. Partially visualized remote posttraumatic deformity of the proximal right femur. Remaining bones of the pelvis are intact without abnormal diastatic widening of the symphysis or SI joints. Portions of the sacrum are obscured by overlying bowel gas. Likely remote posttraumatic deformity of the right inferior pubic ramus. The osseous structures appear diffusely demineralized which may limit detection of small or nondisplaced fractures. IMPRESSION: Comminuted intertrochanteric fracture of the left femur with pronounced fragmentation and partial retraction of the lesser trochanter. Extensive adjacent soft tissue swelling. Femoral heads remain normally located. Remaining bones of the pelvis remain intact and congruent. Prior bilateral femoral intramedullary nail placement. Electronically Signed   By: Lovena Le M.D.   On: 10/04/2019 16:16    EKG: Independently reviewed. Sinus rhythm with RBBB.   Assessment/Plan Active Problems:   Closed left hip fracture (HCC)  Closed left hip fracture:  From mechanical fall.  Admit for  surgical repair.  X rays show Comminuted intertrochanteric fracture of the left femur with pronounced fragmentation and partial retraction of the lesser trochanter. Extensive adjacent soft tissue swelling. Prior bilateral femoral intramedullary nail placement. Orthopedics consulted by EDP, pending evaluation.   Pain control with IV fentanyl and tylenol.  Gentle hydration as pt is NPO .     Essential hypertension:  bp parameters are well controlled.  Resume home meds in am    H/o osteoarthritis  S/p Prior bilateral femoral intramedullary nail placements are present.   H/o NHL : Pt reports he is in remission.   Severity of Illness: The appropriate patient status for this patient is INPATIENT. Inpatient status is judged to be reasonable and necessary in order to provide the required intensity of service to ensure the patient's safety. [name] presenting symptoms, physical exam findings, and initial radiographic and laboratory data in the context of their chronic comorbidities is felt to place them at high risk for further clinical deterioration. Furthermore, it is not anticipated that  the patient will be medically stable for discharge from the hospital within 2 midnights of admission.   * I certify that at the point of admission it is my clinical judgment that the patient will require inpatient hospital care spanning beyond 2 midnights from the point of admission due to high intensity of service, high risk for further deterioration and high frequency of surveillance required.*     DVT prophylaxis: Lovenox.  Code Status: full code.  Family Communication: none at bedside.  Disposition Plan: pending surgical repair of th left hip fracture.  Consults called: Orthopedics Dr Lorin Mercy.  Admission status: inpatient, med surg.   Hosie Poisson MD [facility]   10/04/2019, 7:22 PM

## 2019-10-04 NOTE — ED Provider Notes (Signed)
[facility] EMERGENCY DEPARTMENT Provider Note   CSN: [id number] Arrival date & time: 10/04/19  1515     History No chief complaint on file.   RICQUAN ELZA is a 79 y.o. adult.  The history is provided by the patient and medical records.  Hip Pain This is a new problem. The current episode started less than 1 hour ago. The problem occurs constantly. The problem has not changed since onset.Pertinent negatives include no chest pain, no abdominal pain, no headaches and no shortness of breath. The symptoms are aggravated by standing and twisting. Nothing relieves the symptoms. She has tried nothing for the symptoms. The treatment provided no relief.       Past Medical History:  Diagnosis Date  . Femur fracture ([place])   . Hip fracture ([place])   . Hypertension   . Malignant neoplasm of pancreas, part unspecified   . Other malignant lymphomas, unspecified site, extranodal and solid organ sites     Patient Active Problem List   Diagnosis Date Noted  . Port-A-Cath in place 02/04/2019  . Thrombocytopenia ([place]) 01/22/2018  . 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 History of arthroscopy of left shoulder 07/12/2016  . Port catheter in place 02/22/2016  . 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 Vitamin D deficiency 11/21/2012  . Lymphoma ([place]) 06/13/2012    Past Surgical History:  Procedure Laterality Date  . FEMUR SURGERY Bilateral   . LEG SURGERY Left   . SHOULDER SURGERY Left        No family history on file.  Social History   Tobacco Use  . Smoking status: Never Smoker  . Smokeless tobacco: Never Used  Substance Use Topics  . Alcohol use: No  . Drug use: No    Home Medications Prior to Admission medications   Medication Sig Start Date End Date Taking? Authorizing Provider  Aflibercept (EYLEA) 2 MG/0.05ML SOLN Inject into the eye as directed.  Take every 6 wks in eye doctor office.    [provider]  Ascorbic Acid (VITAMIN C) 1000 MG tablet Take 2,000 mg by mouth daily.      [provider]  Calcium-Magnesium (CALMAG THINS PO) Take by mouth.    [provider]  cholecalciferol (VITAMIN D) 1000 UNITS tablet Take 15,000 Units by mouth daily.     [provider]  lisinopril-hydrochlorothiazide (PRINZIDE,ZESTORETIC) 20-12.5 MG per tablet Take 2 tablets by mouth daily.     [provider]  Multiple Vitamin (MULTIVITAMIN) tablet Take 6 tablets by mouth daily.      [provider]  neomycin-polymyxin b-dexamethasone (MAXITROL) 3.5-10000-0.1 OINT  10/10/16   [provider]  Probiotic Product (PROBIOTIC FORMULA PO) Take 1 tablet by mouth daily.      [provider]  UNABLE TO FIND Take 1 capsule by mouth daily. Med Name:TUMERIC    [provider]  Zinc 30 MG TABS Take 1 tablet by mouth every morning.    [provider]    Allergies    Iohexol and Sulfa antibiotics  Review of Systems   Review of Systems  Constitutional: Negative for chills, fatigue and fever.  HENT: Negative for congestion.   Respiratory: Negative for cough, chest tightness and shortness of breath.   Cardiovascular: Negative for chest pain.  Gastrointestinal: Negative for abdominal pain, diarrhea, nausea and vomiting.  Genitourinary: Negative for flank pain.  Musculoskeletal: Negative  for back pain.  Skin: Negative for wound.  Neurological: Negative for headaches.  Psychiatric/Behavioral: Negative for agitation.  All other systems reviewed and are negative.   Physical Exam Updated Vital Signs BP (!) 149/77 (BP Location: Left Arm)   Pulse 69   Temp 98 F (36.7 C) (Oral)   Resp 14   SpO2 100%   Physical Exam Vitals and nursing note reviewed.  Constitutional:      General: She is not in acute distress.    Appearance: She is not ill-appearing, toxic-appearing or  diaphoretic.  HENT:     Head: Normocephalic and atraumatic.  Eyes:     Conjunctiva/sclera: Conjunctivae normal.     Pupils: Pupils are equal, round, and reactive to light.  Cardiovascular:     Rate and Rhythm: Normal rate.     Pulses: Normal pulses.     Heart sounds: No murmur.  Pulmonary:     Effort: Pulmonary effort is normal.     Breath sounds: No wheezing, rhonchi or rales.  Chest:     Chest wall: No tenderness.  Abdominal:     Tenderness: There is no abdominal tenderness.  Musculoskeletal:        General: Tenderness and deformity present.     Cervical back: No tenderness.     Right lower leg: No edema.     Left lower leg: Deformity and tenderness present. No edema.       Legs:     Comments: Shortened left leg.  Scars seen on left leg.  Sensation at baseline and good pulse and strength in the feet.  Tenderness in the proximal left leg.  Skin:    Capillary Refill: Capillary refill takes less than 2 seconds.     Findings: No erythema or rash.  Neurological:     General: No focal deficit present.     Mental Status: She is alert and oriented to person, place, and time.     Sensory: No sensory deficit.     Motor: No weakness.  Psychiatric:        Mood and Affect: Mood normal.     ED Results / Procedures / Treatments   Labs (all labs ordered are listed, but only abnormal results are displayed) Labs Reviewed  COMPREHENSIVE METABOLIC PANEL - Abnormal; Notable for the following components:      Result Value   CO2 20 (*)    Glucose, Bld 156 (*)    Total Bilirubin 1.4 (*)    GFR calc non Af Amer 55 (*)    All other components within normal limits  RESPIRATORY PANEL BY RT PCR (FLU A&B, COVID)  CBC WITH DIFFERENTIAL/PLATELET    EKG None ED ECG REPORT   Date: 10/04/2019  Rate: 77  Rhythm: normal sinus rhythm  QRS Axis: normal  Intervals: normal  ST/T Wave abnormalities: normal  Conduction Disutrbances:right bundle branch block and left anterior fascicular block   Narrative Interpretation:   Old EKG Reviewed: none available  I have personally reviewed the EKG tracing and agree with the computerized printout as noted.    Radiology DG Chest Portable 1 View  Result Date: 10/04/2019 CLINICAL DATA:  Hip fracture, preoperative EXAM: PORTABLE CHEST 1 VIEW COMPARISON:  CT chest 05/31/2012, radiograph 12/08/2008 FINDINGS: Right subclavian approach Port-A-Cath tip terminates at the right atrium. The aorta is calcified. The remaining cardiomediastinal contours are unremarkable. Chronic elevation of the right hemidiaphragm is similar to multiple remote comparison is. There are diffusely coarsened interstitial changes which could reflect chronic  and slightly worsening interstitial fibrosis seen in more early stages on comparison CT. Partially visualized hardware in the left humerus. Degenerative changes are present in the imaged spine and shoulders, left greater than right. Telemetry leads overlie the chest. IMPRESSION: No acute cardiopulmonary abnormality. Diffusely coarsened interstitial changes which could reflect chronic and slightly worsening interstitial fibrotic features seen in more early stages on comparison CT. Aortic Atherosclerosis (ICD10-I70.0). Electronically Signed   By: Lovena Le M.D.   On: 10/04/2019 16:40   DG Knee Left Port  Result Date: 10/04/2019 CLINICAL DATA:  Status post fall. EXAM: PORTABLE LEFT KNEE - 1-2 VIEW COMPARISON:  None. FINDINGS: No evidence of fracture, or dislocation. Small suprapatellar joint effusion. Intact distal tip of left femoral intramedullary rod. Lateral suprapatellar soft tissue swelling. IMPRESSION: 1. No acute fracture or dislocation identified about the left knee. 2. Small suprapatellar joint effusion. Electronically Signed   By: Fidela Salisbury M.D.   On: 10/04/2019 18:21   DG Hip Port Unilat W or Wo Pelvis 1 View Left  Result Date: 10/04/2019 CLINICAL DATA:  Fall, left hip deformity EXAM: DG HIP (WITH OR WITHOUT  PELVIS) 1V PORT LEFT COMPARISON:  CT abdomen pelvis 01/06/2015 FINDINGS: There is a comminuted intertrochanteric fracture of the left femur with pronounced fragmentation and partial retraction of the lesser trochanter. Extensive adjacent soft tissue swelling. Femoral heads remain normally located. Prior bilateral femoral intramedullary nail placements are present. The proximal extent of a right intramedullary nail secured by fixation screws the left demonstrating screw tract lucency with absent fixation screws. Partially visualized remote posttraumatic deformity of the proximal right femur. Remaining bones of the pelvis are intact without abnormal diastatic widening of the symphysis or SI joints. Portions of the sacrum are obscured by overlying bowel gas. Likely remote posttraumatic deformity of the right inferior pubic ramus. The osseous structures appear diffusely demineralized which may limit detection of small or nondisplaced fractures. IMPRESSION: Comminuted intertrochanteric fracture of the left femur with pronounced fragmentation and partial retraction of the lesser trochanter. Extensive adjacent soft tissue swelling. Femoral heads remain normally located. Remaining bones of the pelvis remain intact and congruent. Prior bilateral femoral intramedullary nail placement. Electronically Signed   By: Lovena Le M.D.   On: 10/04/2019 16:16    Procedures Procedures (including critical care time)  Medications Ordered in ED Medications  0.9 %  sodium chloride infusion (has no administration in time range)  acetaminophen (TYLENOL) tablet 650 mg (650 mg Oral Given 10/04/19 1859)  morphine 2 MG/ML injection 1-2 mg (0 mg Intravenous Hold 10/04/19 1854)  ondansetron (ZOFRAN) injection 4 mg (4 mg Intravenous Given 10/04/19 1854)  fentaNYL (SUBLIMAZE) injection 50 mcg (50 mcg Intravenous Given 10/04/19 1542)    ED Course  I have reviewed the triage vital signs and the nursing notes.  Pertinent labs & imaging  results that were available during my care of the patient were reviewed by me and considered in my medical decision making (see chart for details).    MDM Rules/Calculators/A&P                      EIVEN SANGER is a 79 y.o. adult with a past medical history significant for prior femur fracture, cancer, and arthritis who presents with left hip deformity and pain after a fall.  Patient reports that he was standing up this morning after using a for warmer when he got tripped up and fell to the ground landing on his left hip.  He  reports sudden onset of pain and deformity and is concerned he may have broken his hip.  He reports the pain is greater than 10 out of 10 and he received both fentanyl and ketamine with EMS.  He reports his pain has improved.  He reports no new numbness, tingling, weakness in the leg compared to prior.  He does report chronic left leg numbness due to his previous surgeries on the leg.  He denies any other injuries including no headache, neck pain, or back pain.  No chest or abdominal pain.  He denies hitting his head or losing consciousness.  No other complaints.  On exam, patient does have a shortened left leg.  There is tenderness and deformity present in the left hip.  Abdomen and chest are nontender.  Lungs are clear.  No focal neurologic deficits.  Patient reports he has some numbness in his left leg which is at his complete baseline with no worsening.  Good pulse and sensation.  No laceration seen.  Clinical aspect patient either fractured his hip or dislocated his hip.  Will get imaging to determine the extent of his injuries.  At this time, he is not wanting any new pain medicine but if he needs it will provide it.  Will get Covid test as a preoperative screening or if he needs procedural sedation here in the emergency department for relocation of dislocated hip.  Anticipate reassessment after imaging.  4:54 PM Just spoke to Dr. Mindi Slicker with orthopedics who  recommended admission to medicine service and he will see the patient in the morning.  We will get screening labs, chest x-ray, EKG, and Covid test.  Patient will be admitted to medicine for further management of hip fracture.   Final Clinical Impression(s) / ED Diagnoses Final diagnoses:  Fall, initial encounter  Closed fracture of left hip, initial encounter ([place])     Clinical Impression: 1. Fall, initial encounter   2. Closed fracture of left hip, initial encounter [facility])     Disposition: Admit  This note was prepared with assistance of Dragon voice recognition software. Occasional wrong-word or sound-a-like substitutions may have occurred due to the inherent limitations of voice recognition software.     Virdie Penning, Gwenyth Allegra, MD 10/04/19 Einar Crow

## 2019-10-04 NOTE — ED Notes (Signed)
Vonzell Schlatter (Daughter#[phone]) called for an update.

## 2019-10-04 NOTE — Progress Notes (Signed)
Will see pt in AM. He will need old intramedullary nail removed from left femur from old insertion site at left knee and then fixation of left IT hip fracture. I have posted him for 11 AM Monday Ortho Trauma room . OK to feed pt Sunday.

## 2019-10-04 NOTE — ED Notes (Signed)
Dr. Tegler at bedside  

## 2019-10-04 NOTE — ED Triage Notes (Signed)
Per [facility]: Pt lost balance when standing, landed on left hip and dislocated it. Pt was given 100 mcg of fentanyl and 16.5 mg of ketamine IV with EMS. This helped with pts pain. Pt alert and oriented X 4 in triage. PMS is intact.

## 2019-10-05 ENCOUNTER — Other Ambulatory Visit: Payer: Self-pay

## 2019-10-05 DIAGNOSIS — R52 Pain, unspecified: Secondary | ICD-10-CM

## 2019-10-05 DIAGNOSIS — S72002A Fracture of unspecified part of neck of left femur, initial encounter for closed fracture: Secondary | ICD-10-CM

## 2019-10-05 LAB — BASIC METABOLIC PANEL
Anion gap: 9 (ref 5–15)
BUN: 17 mg/dL (ref 8–23)
CO2: 24 mmol/L (ref 22–32)
Calcium: 9 mg/dL (ref 8.9–10.3)
Chloride: 106 mmol/L (ref 98–111)
Creatinine, Ser: 1.14 mg/dL (ref 0.61–1.24)
GFR calc Af Amer: >60 mL/min (ref >60)
GFR calc non Af Amer: >60 mL/min (ref >60)
Glucose, Bld: 127 mg/dL — ABNORMAL HIGH (ref 70–99)
Potassium: 4 mmol/L (ref 3.5–5.1)
Sodium: 139 mmol/L (ref 135–145)

## 2019-10-05 LAB — CBC
HCT: 40.6 % (ref 39.0–52.0)
Hemoglobin: 13.2 g/dL (ref 13.0–17.0)
MCH: 28.8 pg (ref 26.0–34.0)
MCHC: 32.5 g/dL (ref 30.0–36.0)
MCV: 88.5 fL (ref 80.0–100.0)
Platelets: 167 10*3/uL (ref 150–400)
RBC: 4.59 MIL/uL (ref 4.22–5.81)
RDW: 14.1 % (ref 11.5–15.5)
WBC: 11.1 10*3/uL — ABNORMAL HIGH (ref 4.0–10.5)
nRBC: 0 % (ref 0.0–0.2)

## 2019-10-05 LAB — MAGNESIUM: Magnesium: 2 mg/dL (ref 1.7–2.4)

## 2019-10-05 MED ORDER — CEFAZOLIN SODIUM-DEXTROSE 2-4 GM/100ML-% IV SOLN
2.0000 g | INTRAVENOUS | Status: AC
  Administered 2019-10-06: 2 g via INTRAVENOUS
  Filled 2019-10-05: qty 100

## 2019-10-05 MED ORDER — ENSURE PRE-SURGERY PO LIQD
296.0000 mL | Freq: Once | ORAL | Status: AC
  Administered 2019-10-05: 296 mL via ORAL
  Filled 2019-10-05: qty 296

## 2019-10-05 MED ORDER — LISINOPRIL-HYDROCHLOROTHIAZIDE 20-12.5 MG PO TABS: 2.0000 | ORAL_TABLET | Freq: Every day | ORAL | Status: DC

## 2019-10-05 MED ORDER — LISINOPRIL 20 MG PO TABS
40.0000 mg | ORAL_TABLET | Freq: Every day | ORAL | Status: DC
  Administered 2019-10-05 – 2019-10-11 (×6): 40 mg via ORAL
  Filled 2019-10-05 (×6): qty 2

## 2019-10-05 MED ORDER — POVIDONE-IODINE 10 % EX SWAB: 2.0000 "application " | Freq: Once | CUTANEOUS | Status: DC

## 2019-10-05 MED ORDER — CHLORHEXIDINE GLUCONATE 4 % EX LIQD
60.0000 mL | Freq: Once | CUTANEOUS | Status: AC
  Administered 2019-10-06: 4 via TOPICAL

## 2019-10-05 MED ORDER — HYDROMORPHONE HCL 1 MG/ML IJ SOLN
1.0000 mg | INTRAMUSCULAR | Status: DC | PRN
  Administered 2019-10-05 – 2019-10-08 (×10): 1 mg via INTRAVENOUS
  Filled 2019-10-05 (×10): qty 1

## 2019-10-05 MED ORDER — HYDROCHLOROTHIAZIDE 25 MG PO TABS
25.0000 mg | ORAL_TABLET | Freq: Every day | ORAL | Status: DC
  Administered 2019-10-05 – 2019-10-11 (×6): 25 mg via ORAL
  Filled 2019-10-05 (×6): qty 1

## 2019-10-05 MED ORDER — OXYCODONE HCL 5 MG PO TABS
5.0000 mg | ORAL_TABLET | ORAL | Status: DC | PRN
  Administered 2019-10-05 – 2019-10-11 (×23): 5 mg via ORAL
  Filled 2019-10-05 (×24): qty 1

## 2019-10-05 NOTE — Plan of Care (Signed)

## 2019-10-05 NOTE — H&P (View-Only) (Signed)
Reason for Consult:left closed ,displaced ,comminuted intertrochanteric  hip fracture Referring Physician: Jamse Arn MD  [name] [name] is an 79 y.o. adult.  HPI: 79 year old male with history of lymphoma previous femur fractures treated with intramedullary rods and mechanical fall with left intertrochanteric hip fracture.  Previous femur fractures were fixed with retrograde rods and his left hip fracture is above the left intramedullary femoral nail that ends in the subtroches region.  Patient has history of hypertension.  Previous surgeries was done by Dr. Sharol Given.  Patient had pancreatic mass noted on PET scan biopsy showed evidence of acute and chronic inflammation negative for malignancy.  Past Medical History:  Diagnosis Date  . Femur fracture ([place])   . Hip fracture ([place])   . Hypertension   . Malignant neoplasm of pancreas, part unspecified   . Other malignant lymphomas, unspecified site, extranodal and solid organ sites     Past Surgical History:  Procedure Laterality Date  . FEMUR SURGERY Bilateral   . LEG SURGERY Left   . SHOULDER SURGERY Left     No family history on file.  Social History:  reports that she has never smoked. She has never used smokeless tobacco. She reports that she does not drink alcohol or use drugs.  Allergies:  Allergies  Allergen Reactions  . Morphine And Related Anxiety    HALLUCINATIONS  . Iohexol      Desc: pt. broke out in red pimples 3 days after his ct scan w/contrast, ok with pre-meds   . Sulfa Antibiotics Hives    Medications: I have reviewed the patient's current medications.  Results for orders placed or performed during the hospital encounter of 10/04/19 (from the past 48 hour(s))  CBC with Differential     Status: None   Collection Time: 10/04/19  3:41 PM  Result Value Ref Range   WBC 8.3 4.0 - 10.5 K/uL   RBC 5.42 4.22 - 5.81 MIL/uL   Hemoglobin 15.6 13.0 - 17.0 g/dL   HCT 48.4 39.0 - 52.0 %   MCV 89.3 80.0 - 100.0 fL    MCH 28.8 26.0 - 34.0 pg   MCHC 32.2 30.0 - 36.0 g/dL   RDW 14.0 11.5 - 15.5 %   Platelets 180 150 - 400 K/uL   nRBC 0.0 0.0 - 0.2 %   Neutrophils Relative % 75 %   Neutro Abs 6.3 1.7 - 7.7 K/uL   Lymphocytes Relative 18 %   Lymphs Abs 1.5 0.7 - 4.0 K/uL   Monocytes Relative 5 %   Monocytes Absolute 0.4 0.1 - 1.0 K/uL   Eosinophils Relative 1 %   Eosinophils Absolute 0.1 0.0 - 0.5 K/uL   Basophils Relative 1 %   Basophils Absolute 0.0 0.0 - 0.1 K/uL   Immature Granulocytes 0 %   Abs Immature Granulocytes 0.02 0.00 - 0.07 K/uL    Comment: Performed at [facility], [address] [place], [place] [address]  Comprehensive metabolic panel     Status: Abnormal   Collection Time: 10/04/19  3:41 PM  Result Value Ref Range   Sodium 136 135 - 145 mmol/L   Potassium 3.8 3.5 - 5.1 mmol/L   Chloride 104 98 - 111 mmol/L   CO2 20 (L) 22 - 32 mmol/L   Glucose, Bld 156 (H) 70 - 99 mg/dL   BUN 16 8 - 23 mg/dL   Creatinine, Ser 1.23 0.61 - 1.24 mg/dL   Calcium 9.1 8.9 - 10.3 mg/dL   Total  Protein 6.9 6.5 - 8.1 g/dL   Albumin 4.1 3.5 - 5.0 g/dL   AST 22 15 - 41 U/L   ALT 20 0 - 44 U/L   Alkaline Phosphatase 57 38 - 126 U/L   Total Bilirubin 1.4 (H) 0.3 - 1.2 mg/dL   GFR calc non Af Amer 55 (L) >60 mL/min   GFR calc Af Amer >60 >60 mL/min   Anion gap 12 5 - 15    Comment: Performed at [facility] [address] [place], [place] [address]  Respiratory Panel by RT PCR (Flu A&B, Covid) - Nasopharyngeal Swab     Status: None   Collection Time: 10/04/19  3:41 PM   Specimen: Nasopharyngeal Swab  Result Value Ref Range   SARS Coronavirus 2 by RT PCR NEGATIVE NEGATIVE    Comment: (NOTE) SARS-CoV-2 target nucleic acids are NOT DETECTED. The SARS-CoV-2 RNA is generally detectable in upper respiratoy specimens during the acute phase of infection. The lowest concentration of SARS-CoV-2 viral copies this assay can detect is 131 copies/mL. A negative result does not preclude  SARS-Cov-2 infection and should not be used as the sole basis for treatment or other patient management decisions. A negative result may occur with  improper specimen collection/handling, submission of specimen other than nasopharyngeal swab, presence of viral mutation(s) within the areas targeted by this assay, and inadequate number of viral copies (<131 copies/mL). A negative result must be combined with clinical observations, patient history, and epidemiological information. The expected result is Negative. Fact Sheet for Patients:  PinkCheek.be Fact Sheet for Healthcare Providers:  GravelBags.it This test is not yet ap proved or cleared by the [place] FDA and  has been authorized for detection and/or diagnosis of SARS-CoV-2 by FDA under an Emergency Use Authorization (EUA). This EUA will remain  in effect (meaning this test can be used) for the duration of the COVID-19 declaration under Section 564(b)(1) of the Act, 21 U.S.C. section 360bbb-3(b)(1), unless the authorization is terminated or revoked sooner.    Influenza A by PCR NEGATIVE NEGATIVE   Influenza B by PCR NEGATIVE NEGATIVE    Comment: (NOTE) The Xpert Xpress SARS-CoV-2/FLU/RSV assay is intended as an aid in  the diagnosis of influenza from Nasopharyngeal swab specimens and  should not be used as a sole basis for treatment. Nasal washings and  aspirates are unacceptable for Xpert Xpress SARS-CoV-2/FLU/RSV  testing. Fact Sheet for Patients: PinkCheek.be Fact Sheet for Healthcare Providers: GravelBags.it This test is not yet approved or cleared by the [place] FDA and  has been authorized for detection and/or diagnosis of SARS-CoV-2 by  FDA under an Emergency Use Authorization (EUA). This EUA will remain  in effect (meaning this test can be used) for the duration of the  Covid-19 declaration  under Section 564(b)(1) of the Act, 21  U.S.C. section 360bbb-3(b)(1), unless the authorization is  terminated or revoked. Performed at [facility], [place] [address] [place], [place] [address]   MRSA PCR Screening     Status: None   Collection Time: 10/04/19  9:50 PM   Specimen: Nasal Mucosa; Nasopharyngeal  Result Value Ref Range   MRSA by PCR NEGATIVE NEGATIVE    Comment:        The GeneXpert MRSA Assay (FDA approved for NASAL specimens only), is one component of a comprehensive MRSA colonization surveillance program. It is not intended to diagnose MRSA infection nor to guide or monitor treatment for MRSA infections. Performed at [facility], [place] [address], [place], [place] [address]   CBC daily once in am     Status: Abnormal   Collection Time: 10/05/19  6:06 AM  Result Value Ref Range   WBC 11.1 (H) 4.0 - 10.5 K/uL   RBC 4.59 4.22 - 5.81 MIL/uL   Hemoglobin 13.2 13.0 - 17.0 g/dL   HCT 40.6 39.0 - 52.0 %   MCV 88.5 80.0 - 100.0 fL   MCH 28.8 26.0 - 34.0 pg   MCHC 32.5 30.0 - 36.0 g/dL   RDW 14.1 11.5 - 15.5 %   Platelets 167 150 - 400 K/uL   nRBC 0.0 0.0 - 0.2 %    Comment: Performed at [facility], [place] [address] [place], [place] [id number]  Basic metabolic panel once in am     Status: Abnormal   Collection Time: 10/05/19  6:06 AM  Result Value Ref Range   Sodium 139 135 - 145 mmol/L   Potassium 4.0 3.5 - 5.1 mmol/L   Chloride 106 98 - 111 mmol/L   CO2 24 22 - 32 mmol/L   Glucose, Bld 127 (H) 70 - 99 mg/dL   BUN 17 8 - 23 mg/dL   Creatinine, Ser 1.14 0.61 - 1.24 mg/dL   Calcium 9.0 8.9 - 10.3 mg/dL   GFR calc non Af Amer >60 >60 mL/min   GFR calc Af Amer >60 >60 mL/min   Anion gap 9 5 - 15    Comment: Performed at [facility] [address], [place], [place] [address]  Magnesium     Status: None   Collection Time: 10/05/19  6:06 AM  Result Value Ref Range   Magnesium 2.0 1.7 - 2.4 mg/dL    Comment: Performed at [facility] [address], [place],  [address]    DG Chest Portable 1 View  Result Date: 10/04/2019 CLINICAL DATA:  Hip fracture, preoperative EXAM: PORTABLE CHEST 1 VIEW COMPARISON:  CT chest 05/31/2012, radiograph 12/08/2008 FINDINGS: Right subclavian approach Port-A-Cath tip terminates at the right atrium. The aorta is calcified. The remaining cardiomediastinal contours are unremarkable. Chronic elevation of the right hemidiaphragm is similar to multiple remote comparison is. There are diffusely coarsened interstitial changes which could reflect chronic and slightly worsening interstitial fibrosis seen in more early stages on comparison CT. Partially visualized hardware in the left humerus. Degenerative changes are present in the imaged spine and shoulders, left greater than right. Telemetry leads overlie the chest. IMPRESSION: No acute cardiopulmonary abnormality. Diffusely coarsened interstitial changes which could reflect chronic and slightly worsening interstitial fibrotic features seen in more early stages on comparison CT. Aortic Atherosclerosis (ICD10-I70.0). Electronically Signed   By: Lovena Le M.D.   On: 10/04/2019 16:40   DG Knee Left Port  Result Date: 10/04/2019 CLINICAL DATA:  Status post fall. EXAM: PORTABLE LEFT KNEE - 1-2 VIEW COMPARISON:  None. FINDINGS: No evidence of fracture, or dislocation. Small suprapatellar joint effusion. Intact distal tip of left femoral intramedullary rod. Lateral suprapatellar soft tissue swelling. IMPRESSION: 1. No acute fracture or dislocation identified about the left knee. 2. Small suprapatellar joint effusion. Electronically Signed   By: Fidela Salisbury M.D.   On: 10/04/2019 18:21   DG Hip Port [place] Pelvis 1 View Left  Result Date: 10/04/2019 CLINICAL DATA:  Fall, left hip deformity EXAM: DG HIP (WITH OR WITHOUT PELVIS) 1V PORT LEFT COMPARISON:  CT abdomen pelvis 01/06/2015 FINDINGS: There is a comminuted  intertrochanteric  fracture of the left femur with pronounced fragmentation and partial retraction of the lesser trochanter. Extensive adjacent soft tissue swelling. Femoral heads remain normally located. Prior bilateral femoral intramedullary nail placements are present. The proximal extent of a right intramedullary nail secured by fixation screws the left demonstrating screw tract lucency with absent fixation screws. Partially visualized remote posttraumatic deformity of the proximal right femur. Remaining bones of the pelvis are intact without abnormal diastatic widening of the symphysis or SI joints. Portions of the sacrum are obscured by overlying bowel gas. Likely remote posttraumatic deformity of the right inferior pubic ramus. The osseous structures appear diffusely demineralized which may limit detection of small or nondisplaced fractures. IMPRESSION: Comminuted intertrochanteric fracture of the left femur with pronounced fragmentation and partial retraction of the lesser trochanter. Extensive adjacent soft tissue swelling. Femoral heads remain normally located. Remaining bones of the pelvis remain intact and congruent. Prior bilateral femoral intramedullary nail placement. Electronically Signed   By: Lovena Le M.D.   On: 10/04/2019 16:16    ROS patient has past history of he is capsulitis.  Lymphoma.  Hypertension fall pancreatic mass with inflammation.  Positive osteoarthritis of the shoulder, thrombocytopenia.  Negative for angina.  Negative for CVA.  Otherwise noncontributory as pertains HPI. Blood pressure 137/81, pulse 81, temperature 98.4 F (36.9 C), temperature source Oral, resp. rate 16, SpO2 98 %. Physical Exam  Constitutional: She is oriented to person, place, and time. She appears well-developed and well-nourished.  HENT:  Head: Normocephalic and atraumatic.  Eyes: Pupils are equal, round, and reactive to light. Conjunctivae and EOM are normal.  Neck: No tracheal deviation present. No thyromegaly  present.  Cardiovascular: Normal rate and regular rhythm.  Respiratory: Effort normal and breath sounds normal.  GI: Soft. Bowel sounds are normal.  Musculoskeletal:        General: Deformity present. No edema.     Cervical back: Normal range of motion.     Comments: Short and ER LE with intact pulses.   Neurological: She is alert and oriented to person, place, and time.  Skin: Skin is warm and dry.  Psychiatric: She has a normal mood and affect. Her behavior is normal. Thought content normal.    Assessment/Plan: Patient with left intertrochanteric hip fracture above previous retrograde femoral nail with distal interlock just above the knee.  He will require removal of previous intramedullary nail with incision at the knee, removal of the distal interlock, nail removal and then standard trochanteric hip nail for his comminuted intertrochanteric hip fracture.  Plan procedure discussed with patient risk discussed with patient as well as his daughter over the phone.  Questions were elicited and answered.  Risk surgery discussed they understand and agreed to proceed.  [name] [name] 10/05/2019, 4:20 PM

## 2019-10-05 NOTE — Progress Notes (Signed)
PROGRESS NOTE    [name] [name]  [id number]  DOB: 29-Feb-1941  DOA: 10/04/2019 PCP: [name], MD Outpatient Specialists:   Hospital course: [name] [name] is a 79 y.o. adult with medical history significant of  NHL in remission, hypertension, previous hip repair surgeries was admitted 10/04/2019 with left hip fracture.  He has a comminuted intertrochanteric fracture of the left femur with pronounced fragmentation and partial retraction of the lesser trochanter.  Orthopedics was consulted and they will see him today.  Subjective:  Patient's main concern is pain control.  Notes he was not able to sleep well at night secondary to both hip and back pain.  Notes he does not normally take medications for back pain however lying in 1 position has been difficult for him.  He notes Percocet he was given is great for back pain but does not help his hip pain very much.  Notes the IV pain medication he is getting for hip pain does not last very long.  Also states that he is allergic to morphine because "I see little green men".   Objective: Vitals:   10/04/19 2012 10/05/19 0301 10/05/19 0819 10/05/19 1431  BP: (!) 167/83 117/72 (!) 146/97 137/81  Pulse: 75 95 82 81  Resp:   16 16  Temp: (!) 97.3 F (36.3 C)  98.3 F (36.8 C) 98.4 F (36.9 C)  TempSrc: Oral  Oral Oral  SpO2: 98% 97% 99% 98%    Intake/Output Summary (Last 24 hours) at 10/05/2019 1615 Last data filed at 10/05/2019 1300 Gross per 24 hour  Intake 767.88 ml  Output 1050 ml  Net -282.12 ml   There were no vitals filed for this visit.   Assessment & Plan:   Left hip fracture Orthopedics consultation is pending.  Pain management Change fentanyl to IV Dilaudid 1 mg every 4 hours for hip pain. Continue Percocet 1 tablet every 4 hours for back pain.  HTN Will hold lisinopril/HCTZ tonight in case patient goes for surgery in the morning. This can be restarted as warranted after surgery.  Non-Hodgkin's  lymphoma In remission   DVT prophylaxis: On enoxaparin Code Status: Full Family Communication: Patient's daughter was on her way in, will speak with her Disposition Plan: Rehab   Consultants:  Orthopedics  Procedures:  None so far  Antimicrobials:  None   Exam:  General: Friendly pleasant man lying in bed in some discomfort. Eyes: sclera anicteric, conjuctiva mild injection bilaterally CVS: S1-S2 no murmur rubs or gallops Respiratory:  normal effort, symmetrical excursion, CTA without adventitious sounds.  GI: NABS, soft, NT, ND, no palpable masses.  LE: Patient holding his left lower extremity still without movement.  DP pulses are intact and feet are warm. Neuro: A/O x 3,  grossly nonfocal.  Psych: patient is logical and coherent, judgement and insight appear normal, mood and affect appropriate to situation.   Data Reviewed: Basic Metabolic Panel: Recent Labs  Lab 10/04/19 1541 10/05/19 0606  NA 136 139  K 3.8 4.0  CL 104 106  CO2 20* 24  GLUCOSE 156* 127*  BUN 16 17  CREATININE 1.23 1.14  CALCIUM 9.1 9.0  MG  --  2.0   Liver Function Tests: Recent Labs  Lab 10/04/19 1541  AST 22  ALT 20  ALKPHOS 57  BILITOT 1.4*  PROT 6.9  ALBUMIN 4.1   No results for input(s): LIPASE, AMYLASE in the last 168 hours. No results for input(s): AMMONIA in the last 168 hours. CBC:  Recent Labs  Lab 10/04/19 1541 10/05/19 0606  WBC 8.3 11.1*  NEUTROABS 6.3  --   HGB 15.6 13.2  HCT 48.4 40.6  MCV 89.3 88.5  PLT 180 167   Cardiac Enzymes: No results for input(s): CKTOTAL, CKMB, CKMBINDEX, TROPONINI in the last 168 hours. BNP (last 3 results) No results for input(s): PROBNP in the last 8760 hours. CBG: No results for input(s): GLUCAP in the last 168 hours.  Recent Results (from the past 240 hour(s))  Respiratory Panel by RT PCR (Flu A&B, Covid) - Nasopharyngeal Swab     Status: None   Collection Time: 10/04/19  3:41 PM   Specimen: Nasopharyngeal Swab   Result Value Ref Range Status   SARS Coronavirus 2 by RT PCR NEGATIVE NEGATIVE Final    Comment: (NOTE) SARS-CoV-2 target nucleic acids are NOT DETECTED. The SARS-CoV-2 RNA is generally detectable in upper respiratoy specimens during the acute phase of infection. The lowest concentration of SARS-CoV-2 viral copies this assay can detect is 131 copies/mL. A negative result does not preclude SARS-Cov-2 infection and should not be used as the sole basis for treatment or other patient management decisions. A negative result may occur with  improper specimen collection/handling, submission of specimen other than nasopharyngeal swab, presence of viral mutation(s) within the areas targeted by this assay, and inadequate number of viral copies (<131 copies/mL). A negative result must be combined with clinical observations, patient history, and epidemiological information. The expected result is Negative. Fact Sheet for Patients:  PinkCheek.be Fact Sheet for Healthcare Providers:  GravelBags.it This test is not yet ap proved or cleared by the [place] FDA and  has been authorized for detection and/or diagnosis of SARS-CoV-2 by FDA under an Emergency Use Authorization (EUA). This EUA will remain  in effect (meaning this test can be used) for the duration of the COVID-19 declaration under Section 564(b)(1) of the Act, 21 U.S.C. section 360bbb-3(b)(1), unless the authorization is terminated or revoked sooner.    Influenza A by PCR NEGATIVE NEGATIVE Final   Influenza B by PCR NEGATIVE NEGATIVE Final    Comment: (NOTE) The Xpert Xpress SARS-CoV-2/FLU/RSV assay is intended as an aid in  the diagnosis of influenza from Nasopharyngeal swab specimens and  should not be used as a sole basis for treatment. Nasal washings and  aspirates are unacceptable for Xpert Xpress SARS-CoV-2/FLU/RSV  testing. Fact Sheet for  Patients: PinkCheek.be Fact Sheet for Healthcare Providers: GravelBags.it This test is not yet approved or cleared by the [place] FDA and  has been authorized for detection and/or diagnosis of SARS-CoV-2 by  FDA under an Emergency Use Authorization (EUA). This EUA will remain  in effect (meaning this test can be used) for the duration of the  Covid-19 declaration under Section 564(b)(1) of the Act, 21  U.S.C. section 360bbb-3(b)(1), unless the authorization is  terminated or revoked. Performed at [facility], [place] [address], [place], [place] [address]   MRSA PCR Screening     Status: None   Collection Time: 10/04/19  9:50 PM   Specimen: Nasal Mucosa; Nasopharyngeal  Result Value Ref Range Status   MRSA by PCR NEGATIVE NEGATIVE Final    Comment:        The GeneXpert MRSA Assay (FDA approved for NASAL specimens only), is one component of a comprehensive MRSA colonization surveillance program. It is not intended to diagnose MRSA infection nor to guide or monitor treatment for MRSA infections. Performed at [facility], [place]  [address] [place], [place] [address]       Studies: DG Chest Portable 1 View  Result Date: 10/04/2019 CLINICAL DATA:  Hip fracture, preoperative EXAM: PORTABLE CHEST 1 VIEW COMPARISON:  CT chest 05/31/2012, radiograph 12/08/2008 FINDINGS: Right subclavian approach Port-A-Cath tip terminates at the right atrium. The aorta is calcified. The remaining cardiomediastinal contours are unremarkable. Chronic elevation of the right hemidiaphragm is similar to multiple remote comparison is. There are diffusely coarsened interstitial changes which could reflect chronic and slightly worsening interstitial fibrosis seen in more early stages on comparison CT. Partially visualized hardware in the left humerus. Degenerative changes are present in the imaged spine and shoulders, left greater than right.  Telemetry leads overlie the chest. IMPRESSION: No acute cardiopulmonary abnormality. Diffusely coarsened interstitial changes which could reflect chronic and slightly worsening interstitial fibrotic features seen in more early stages on comparison CT. Aortic Atherosclerosis (ICD10-I70.0). Electronically Signed   By: Lovena Le M.D.   On: 10/04/2019 16:40   DG Knee Left Port  Result Date: 10/04/2019 CLINICAL DATA:  Status post fall. EXAM: PORTABLE LEFT KNEE - 1-2 VIEW COMPARISON:  None. FINDINGS: No evidence of fracture, or dislocation. Small suprapatellar joint effusion. Intact distal tip of left femoral intramedullary rod. Lateral suprapatellar soft tissue swelling. IMPRESSION: 1. No acute fracture or dislocation identified about the left knee. 2. Small suprapatellar joint effusion. Electronically Signed   By: Fidela Salisbury M.D.   On: 10/04/2019 18:21   DG Hip Port [place] or [place] Pelvis 1 View Left  Result Date: 10/04/2019 CLINICAL DATA:  Fall, left hip deformity EXAM: DG HIP (WITH OR WITHOUT PELVIS) 1V PORT LEFT COMPARISON:  CT abdomen pelvis 01/06/2015 FINDINGS: There is a comminuted intertrochanteric fracture of the left femur with pronounced fragmentation and partial retraction of the lesser trochanter. Extensive adjacent soft tissue swelling. Femoral heads remain normally located. Prior bilateral femoral intramedullary nail placements are present. The proximal extent of a right intramedullary nail secured by fixation screws the left demonstrating screw tract lucency with absent fixation screws. Partially visualized remote posttraumatic deformity of the proximal right femur. Remaining bones of the pelvis are intact without abnormal diastatic widening of the symphysis or SI joints. Portions of the sacrum are obscured by overlying bowel gas. Likely remote posttraumatic deformity of the right inferior pubic ramus. The osseous structures appear diffusely demineralized which may limit detection of  small or nondisplaced fractures. IMPRESSION: Comminuted intertrochanteric fracture of the left femur with pronounced fragmentation and partial retraction of the lesser trochanter. Extensive adjacent soft tissue swelling. Femoral heads remain normally located. Remaining bones of the pelvis remain intact and congruent. Prior bilateral femoral intramedullary nail placement. Electronically Signed   By: Lovena Le M.D.   On: 10/04/2019 16:16     Scheduled Meds: . enoxaparin (LOVENOX) injection  40 mg Subcutaneous Q24H  . hydrochlorothiazide  25 mg Oral Daily   And  . lisinopril  40 mg Oral Daily   Continuous Infusions: . sodium chloride 75 mL/hr at 10/04/19 2034    Active Problems:   Lymphoma ([place])   Closed left hip fracture ([facility])   Essential hypertension     Vashti Hey, MD, FACP, [facility]. [facility]  If 7PM-7AM, please contact night-coverage [email or web address] Password [email or web address] 10/05/2019, 4:15 PM    LOS: 1 day

## 2019-10-05 NOTE — Consult Note (Signed)
Reason for Consult:left closed ,displaced ,comminuted intertrochanteric  hip fracture Referring Physician: Jamse Arn MD  [name] [name] is an 79 y.o. adult.  HPI: 79 year old male with history of lymphoma previous femur fractures treated with intramedullary rods and mechanical fall with left intertrochanteric hip fracture.  Previous femur fractures were fixed with retrograde rods and his left hip fracture is above the left intramedullary femoral nail that ends in the subtroches region.  Patient has history of hypertension.  Previous surgeries was done by Dr. Sharol Given.  Patient had pancreatic mass noted on PET scan biopsy showed evidence of acute and chronic inflammation negative for malignancy.  Past Medical History:  Diagnosis Date  . Femur fracture ([place])   . Hip fracture ([place])   . Hypertension   . Malignant neoplasm of pancreas, part unspecified   . Other malignant lymphomas, unspecified site, extranodal and solid organ sites     Past Surgical History:  Procedure Laterality Date  . FEMUR SURGERY Bilateral   . LEG SURGERY Left   . SHOULDER SURGERY Left     No family history on file.  Social History:  reports that she has never smoked. She has never used smokeless tobacco. She reports that she does not drink alcohol or use drugs.  Allergies:  Allergies  Allergen Reactions  . Morphine And Related Anxiety    HALLUCINATIONS  . Iohexol      Desc: pt. broke out in red pimples 3 days after his ct scan w/contrast, ok with pre-meds   . Sulfa Antibiotics Hives    Medications: I have reviewed the patient's current medications.  Results for orders placed or performed during the hospital encounter of 10/04/19 (from the past 48 hour(s))  CBC with Differential     Status: None   Collection Time: 10/04/19  3:41 PM  Result Value Ref Range   WBC 8.3 4.0 - 10.5 K/uL   RBC 5.42 4.22 - 5.81 MIL/uL   Hemoglobin 15.6 13.0 - 17.0 g/dL   HCT 48.4 39.0 - 52.0 %   MCV 89.3 80.0 - 100.0 fL   MCH 28.8 26.0 - 34.0 pg   MCHC 32.2 30.0 - 36.0 g/dL   RDW 14.0 11.5 - 15.5 %   Platelets 180 150 - 400 K/uL   nRBC 0.0 0.0 - 0.2 %   Neutrophils Relative % 75 %   Neutro Abs 6.3 1.7 - 7.7 K/uL   Lymphocytes Relative 18 %   Lymphs Abs 1.5 0.7 - 4.0 K/uL   Monocytes Relative 5 %   Monocytes Absolute 0.4 0.1 - 1.0 K/uL   Eosinophils Relative 1 %   Eosinophils Absolute 0.1 0.0 - 0.5 K/uL   Basophils Relative 1 %   Basophils Absolute 0.0 0.0 - 0.1 K/uL   Immature Granulocytes 0 %   Abs Immature Granulocytes 0.02 0.00 - 0.07 K/uL    Comment: Performed at [facility], [address], [place], [place] [address]  Comprehensive metabolic panel     Status: Abnormal   Collection Time: 10/04/19  3:41 PM  Result Value Ref Range   Sodium 136 135 - 145 mmol/L   Potassium 3.8 3.5 - 5.1 mmol/L   Chloride 104 98 - 111 mmol/L   CO2 20 (L) 22 - 32 mmol/L   Glucose, Bld 156 (H) 70 - 99 mg/dL   BUN 16 8 - 23 mg/dL   Creatinine, Ser 1.23 0.61 - 1.24 mg/dL   Calcium 9.1 8.9 - 10.3 mg/dL   Total Protein  6.9 6.5 - 8.1 g/dL   Albumin 4.1 3.5 - 5.0 g/dL   AST 22 15 - 41 U/L   ALT 20 0 - 44 U/L   Alkaline Phosphatase 57 38 - 126 U/L   Total Bilirubin 1.4 (H) 0.3 - 1.2 mg/dL   GFR calc non Af Amer 55 (L) >60 mL/min   GFR calc Af Amer >60 >60 mL/min   Anion gap 12 5 - 15    Comment: Performed at [facility] [address] [place], [place] [address]  Respiratory Panel by RT PCR (Flu A&B, Covid) - Nasopharyngeal Swab     Status: None   Collection Time: 10/04/19  3:41 PM   Specimen: Nasopharyngeal Swab  Result Value Ref Range   SARS Coronavirus 2 by RT PCR NEGATIVE NEGATIVE    Comment: (NOTE) SARS-CoV-2 target nucleic acids are NOT DETECTED. The SARS-CoV-2 RNA is generally detectable in upper respiratoy specimens during the acute phase of infection. The lowest concentration of SARS-CoV-2 viral copies this assay can detect is 131 copies/mL. A negative result does not preclude  SARS-Cov-2 infection and should not be used as the sole basis for treatment or other patient management decisions. A negative result may occur with  improper specimen collection/handling, submission of specimen other than nasopharyngeal swab, presence of viral mutation(s) within the areas targeted by this assay, and inadequate number of viral copies (<131 copies/mL). A negative result must be combined with clinical observations, patient history, and epidemiological information. The expected result is Negative. Fact Sheet for Patients:  PinkCheek.be Fact Sheet for Healthcare Providers:  GravelBags.it This test is not yet ap proved or cleared by the [place] FDA and  has been authorized for detection and/or diagnosis of SARS-CoV-2 by FDA under an Emergency Use Authorization (EUA). This EUA will remain  in effect (meaning this test can be used) for the duration of the COVID-19 declaration under Section 564(b)(1) of the Act, 21 U.S.C. section 360bbb-3(b)(1), unless the authorization is terminated or revoked sooner.    Influenza A by PCR NEGATIVE NEGATIVE   Influenza B by PCR NEGATIVE NEGATIVE    Comment: (NOTE) The Xpert Xpress SARS-CoV-2/FLU/RSV assay is intended as an aid in  the diagnosis of influenza from Nasopharyngeal swab specimens and  should not be used as a sole basis for treatment. Nasal washings and  aspirates are unacceptable for Xpert Xpress SARS-CoV-2/FLU/RSV  testing. Fact Sheet for Patients: PinkCheek.be Fact Sheet for Healthcare Providers: GravelBags.it This test is not yet approved or cleared by the [place] FDA and  has been authorized for detection and/or diagnosis of SARS-CoV-2 by  FDA under an Emergency Use Authorization (EUA). This EUA will remain  in effect (meaning this test can be used) for the duration of the  Covid-19 declaration  under Section 564(b)(1) of the Act, 21  U.S.C. section 360bbb-3(b)(1), unless the authorization is  terminated or revoked. Performed at [facility], [place] [address], [place], [place] [address]   MRSA PCR Screening     Status: None   Collection Time: 10/04/19  9:50 PM   Specimen: Nasal Mucosa; Nasopharyngeal  Result Value Ref Range   MRSA by PCR NEGATIVE NEGATIVE    Comment:        The GeneXpert MRSA Assay (FDA approved for NASAL specimens only), is one component of a comprehensive MRSA colonization surveillance program. It is not intended to diagnose MRSA infection nor to guide or monitor treatment for MRSA infections. Performed at [facility], [address] [place], [place] [address]   CBC daily once in am     Status: Abnormal   Collection Time: 10/05/19  6:06 AM  Result Value Ref Range   WBC 11.1 (H) 4.0 - 10.5 K/uL   RBC 4.59 4.22 - 5.81 MIL/uL   Hemoglobin 13.2 13.0 - 17.0 g/dL   HCT 40.6 39.0 - 52.0 %   MCV 88.5 80.0 - 100.0 fL   MCH 28.8 26.0 - 34.0 pg   MCHC 32.5 30.0 - 36.0 g/dL   RDW 14.1 11.5 - 15.5 %   Platelets 167 150 - 400 K/uL   nRBC 0.0 0.0 - 0.2 %    Comment: Performed at [facility], [place] [address], [place], [place] [id number]  Basic metabolic panel once in am     Status: Abnormal   Collection Time: 10/05/19  6:06 AM  Result Value Ref Range   Sodium 139 135 - 145 mmol/L   Potassium 4.0 3.5 - 5.1 mmol/L   Chloride 106 98 - 111 mmol/L   CO2 24 22 - 32 mmol/L   Glucose, Bld 127 (H) 70 - 99 mg/dL   BUN 17 8 - 23 mg/dL   Creatinine, Ser 1.14 0.61 - 1.24 mg/dL   Calcium 9.0 8.9 - 10.3 mg/dL   GFR calc non Af Amer >60 >60 mL/min   GFR calc Af Amer >60 >60 mL/min   Anion gap 9 5 - 15    Comment: Performed at [facility] [address], [place], [place] [address]  Magnesium     Status: None   Collection Time: 10/05/19  6:06 AM  Result Value Ref Range   Magnesium 2.0 1.7 - 2.4 mg/dL    Comment: Performed at [facility] [address], [place],  [address]    DG Chest Portable 1 View  Result Date: 10/04/2019 CLINICAL DATA:  Hip fracture, preoperative EXAM: PORTABLE CHEST 1 VIEW COMPARISON:  CT chest 05/31/2012, radiograph 12/08/2008 FINDINGS: Right subclavian approach Port-A-Cath tip terminates at the right atrium. The aorta is calcified. The remaining cardiomediastinal contours are unremarkable. Chronic elevation of the right hemidiaphragm is similar to multiple remote comparison is. There are diffusely coarsened interstitial changes which could reflect chronic and slightly worsening interstitial fibrosis seen in more early stages on comparison CT. Partially visualized hardware in the left humerus. Degenerative changes are present in the imaged spine and shoulders, left greater than right. Telemetry leads overlie the chest. IMPRESSION: No acute cardiopulmonary abnormality. Diffusely coarsened interstitial changes which could reflect chronic and slightly worsening interstitial fibrotic features seen in more early stages on comparison CT. Aortic Atherosclerosis (ICD10-I70.0). Electronically Signed   By: Lovena Le M.D.   On: 10/04/2019 16:40   DG Knee Left Port  Result Date: 10/04/2019 CLINICAL DATA:  Status post fall. EXAM: PORTABLE LEFT KNEE - 1-2 VIEW COMPARISON:  None. FINDINGS: No evidence of fracture, or dislocation. Small suprapatellar joint effusion. Intact distal tip of left femoral intramedullary rod. Lateral suprapatellar soft tissue swelling. IMPRESSION: 1. No acute fracture or dislocation identified about the left knee. 2. Small suprapatellar joint effusion. Electronically Signed   By: Fidela Salisbury M.D.   On: 10/04/2019 18:21   DG Hip [place] or [place] Pelvis 1 View Left  Result Date: 10/04/2019 CLINICAL DATA:  Fall, left hip deformity EXAM: DG HIP (WITH OR WITHOUT PELVIS) 1V PORT LEFT COMPARISON:  CT abdomen pelvis 01/06/2015 FINDINGS: There is a comminuted intertrochanteric  fracture of the left femur with pronounced fragmentation and partial retraction of the lesser trochanter. Extensive adjacent soft tissue swelling. Femoral heads remain normally located. Prior bilateral femoral intramedullary nail placements are present. The proximal extent of a right intramedullary nail secured by fixation screws the left demonstrating screw tract lucency with absent fixation screws. Partially visualized remote posttraumatic deformity of the proximal right femur. Remaining bones of the pelvis are intact without abnormal diastatic widening of the symphysis or SI joints. Portions of the sacrum are obscured by overlying bowel gas. Likely remote posttraumatic deformity of the right inferior pubic ramus. The osseous structures appear diffusely demineralized which may limit detection of small or nondisplaced fractures. IMPRESSION: Comminuted intertrochanteric fracture of the left femur with pronounced fragmentation and partial retraction of the lesser trochanter. Extensive adjacent soft tissue swelling. Femoral heads remain normally located. Remaining bones of the pelvis remain intact and congruent. Prior bilateral femoral intramedullary nail placement. Electronically Signed   By: Lovena Le M.D.   On: 10/04/2019 16:16    ROS patient has past history of he is capsulitis.  Lymphoma.  Hypertension fall pancreatic mass with inflammation.  Positive osteoarthritis of the shoulder, thrombocytopenia.  Negative for angina.  Negative for CVA.  Otherwise noncontributory as pertains HPI. Blood pressure 137/81, pulse 81, temperature 98.4 F (36.9 C), temperature source Oral, resp. rate 16, SpO2 98 %. Physical Exam  Constitutional: She is oriented to person, place, and time. She appears well-developed and well-nourished.  HENT:  Head: Normocephalic and atraumatic.  Eyes: Pupils are equal, round, and reactive to light. Conjunctivae and EOM are normal.  Neck: No tracheal deviation present. No thyromegaly  present.  Cardiovascular: Normal rate and regular rhythm.  Respiratory: Effort normal and breath sounds normal.  GI: Soft. Bowel sounds are normal.  Musculoskeletal:        General: Deformity present. No edema.     Cervical back: Normal range of motion.     Comments: Short and ER LE with intact pulses.   Neurological: She is alert and oriented to person, place, and time.  Skin: Skin is warm and dry.  Psychiatric: She has a normal mood and affect. Her behavior is normal. Thought content normal.    Assessment/Plan: Patient with left intertrochanteric hip fracture above previous retrograde femoral nail with distal interlock just above the knee.  He will require removal of previous intramedullary nail with incision at the knee, removal of the distal interlock, nail removal and then standard trochanteric hip nail for his comminuted intertrochanteric hip fracture.  Plan procedure discussed with patient risk discussed with patient as well as his daughter over the phone.  Questions were elicited and answered.  Risk surgery discussed they understand and agreed to proceed.  [name] [name] 10/05/2019, 4:20 PM

## 2019-10-06 ENCOUNTER — Inpatient Hospital Stay (HOSPITAL_COMMUNITY): Payer: Medicare HMO

## 2019-10-06 ENCOUNTER — Encounter (HOSPITAL_COMMUNITY)
Admission: EM | Disposition: A | Discharge status: Skilled Nursing Facility | Payer: Self-pay | Source: Home / Self Care | Attending: Internal Medicine

## 2019-10-06 ENCOUNTER — Inpatient Hospital Stay (HOSPITAL_COMMUNITY): Payer: Medicare HMO | Admitting: Anesthesiology

## 2019-10-06 DIAGNOSIS — Z09 Encounter for follow-up examination after completed treatment for conditions other than malignant neoplasm: Secondary | ICD-10-CM

## 2019-10-06 HISTORY — PX: FEMUR IM NAIL: SHX1597

## 2019-10-06 HISTORY — PX: INTRAMEDULLARY (IM) NAIL INTERTROCHANTERIC: SHX5875

## 2019-10-06 LAB — BASIC METABOLIC PANEL
Anion gap: 8 (ref 5–15)
BUN: 12 mg/dL (ref 8–23)
CO2: 27 mmol/L (ref 22–32)
Calcium: 8.7 mg/dL — ABNORMAL LOW (ref 8.9–10.3)
Chloride: 102 mmol/L (ref 98–111)
Creatinine, Ser: 1.16 mg/dL (ref 0.61–1.24)
GFR calc Af Amer: >60 mL/min (ref >60)
GFR calc non Af Amer: 60 mL/min — ABNORMAL LOW (ref >60)
Glucose, Bld: 161 mg/dL — ABNORMAL HIGH (ref 70–99)
Potassium: 4 mmol/L (ref 3.5–5.1)
Sodium: 137 mmol/L (ref 135–145)

## 2019-10-06 LAB — CBC
HCT: 38.2 % — ABNORMAL LOW (ref 39.0–52.0)
Hemoglobin: 12.3 g/dL — ABNORMAL LOW (ref 13.0–17.0)
MCH: 28.7 pg (ref 26.0–34.0)
MCHC: 32.2 g/dL (ref 30.0–36.0)
MCV: 89.3 fL (ref 80.0–100.0)
Platelets: 138 10*3/uL — ABNORMAL LOW (ref 150–400)
RBC: 4.28 MIL/uL (ref 4.22–5.81)
RDW: 14.1 % (ref 11.5–15.5)
WBC: 9.4 10*3/uL (ref 4.0–10.5)
nRBC: 0 % (ref 0.0–0.2)

## 2019-10-06 SURGERY — INSERTION, INTRAMEDULLARY ROD, FEMUR, RETROGRADE
Anesthesia: General | Laterality: Left

## 2019-10-06 MED ORDER — ONDANSETRON HCL 4 MG/2ML IJ SOLN: 4.0000 mg | Freq: Four times a day (QID) | INTRAMUSCULAR | Status: DC | PRN

## 2019-10-06 MED ORDER — EPHEDRINE SULFATE 50 MG/ML IJ SOLN
INTRAMUSCULAR | Status: DC | PRN
  Administered 2019-10-06 (×2): 10 mg via INTRAVENOUS

## 2019-10-06 MED ORDER — 0.9 % SODIUM CHLORIDE (POUR BTL) OPTIME
TOPICAL | Status: DC | PRN
  Administered 2019-10-06: 12:00:00 1000 mL

## 2019-10-06 MED ORDER — PROPOFOL 10 MG/ML IV BOLUS
INTRAVENOUS | Status: DC | PRN
  Administered 2019-10-06: 20 mg via INTRAVENOUS
  Administered 2019-10-06: 130 mg via INTRAVENOUS

## 2019-10-06 MED ORDER — PHENYLEPHRINE HCL-NACL 10-0.9 MG/250ML-% IV SOLN
INTRAVENOUS | Status: DC | PRN
  Administered 2019-10-06: 50 ug/min via INTRAVENOUS

## 2019-10-06 MED ORDER — PHENOL 1.4 % MT LIQD: 1.0000 | OROMUCOSAL | Status: DC | PRN

## 2019-10-06 MED ORDER — FENTANYL CITRATE (PF) 100 MCG/2ML IJ SOLN
25.0000 ug | INTRAMUSCULAR | Status: DC | PRN
  Administered 2019-10-06: 25 ug via INTRAVENOUS

## 2019-10-06 MED ORDER — DOCUSATE SODIUM 100 MG PO CAPS
100.0000 mg | ORAL_CAPSULE | Freq: Two times a day (BID) | ORAL | Status: DC
  Administered 2019-10-06 – 2019-10-08 (×4): 100 mg via ORAL
  Filled 2019-10-06 (×4): qty 1

## 2019-10-06 MED ORDER — ONDANSETRON HCL 4 MG/2ML IJ SOLN
INTRAMUSCULAR | Status: AC
  Filled 2019-10-06: qty 2

## 2019-10-06 MED ORDER — METOCLOPRAMIDE HCL 5 MG/ML IJ SOLN: 5.0000 mg | Freq: Three times a day (TID) | INTRAMUSCULAR | Status: DC | PRN

## 2019-10-06 MED ORDER — LIDOCAINE 2% (20 MG/ML) 5 ML SYRINGE
INTRAMUSCULAR | Status: AC
  Filled 2019-10-06: qty 5

## 2019-10-06 MED ORDER — FENTANYL CITRATE (PF) 100 MCG/2ML IJ SOLN
INTRAMUSCULAR | Status: DC | PRN
  Administered 2019-10-06 (×4): 50 ug via INTRAVENOUS

## 2019-10-06 MED ORDER — LACTATED RINGERS IV SOLN: INTRAVENOUS | Status: DC | PRN

## 2019-10-06 MED ORDER — ALBUMIN HUMAN 5 % IV SOLN: INTRAVENOUS | Status: DC | PRN

## 2019-10-06 MED ORDER — ONDANSETRON HCL 4 MG/2ML IJ SOLN: 4.0000 mg | Freq: Once | INTRAMUSCULAR | Status: DC | PRN

## 2019-10-06 MED ORDER — LACTATED RINGERS IV SOLN: INTRAVENOUS | Status: DC

## 2019-10-06 MED ORDER — FENTANYL CITRATE (PF) 100 MCG/2ML IJ SOLN
INTRAMUSCULAR | Status: AC
  Administered 2019-10-06: 25 ug via INTRAVENOUS
  Filled 2019-10-06: qty 2

## 2019-10-06 MED ORDER — PHENYLEPHRINE 40 MCG/ML (10ML) SYRINGE FOR IV PUSH (FOR BLOOD PRESSURE SUPPORT)
PREFILLED_SYRINGE | INTRAVENOUS | Status: AC
  Filled 2019-10-06: qty 10

## 2019-10-06 MED ORDER — PHENYLEPHRINE HCL (PRESSORS) 10 MG/ML IV SOLN
INTRAVENOUS | Status: DC | PRN
  Administered 2019-10-06 (×4): 80 ug via INTRAVENOUS

## 2019-10-06 MED ORDER — ONDANSETRON HCL 4 MG PO TABS: 4.0000 mg | ORAL_TABLET | Freq: Four times a day (QID) | ORAL | Status: DC | PRN

## 2019-10-06 MED ORDER — BUPIVACAINE-EPINEPHRINE 0.5% -1:200000 IJ SOLN
INTRAMUSCULAR | Status: DC | PRN
  Administered 2019-10-06: 10 mL

## 2019-10-06 MED ORDER — BUPIVACAINE HCL (PF) 0.5 % IJ SOLN
INTRAMUSCULAR | Status: AC
  Filled 2019-10-06: qty 30

## 2019-10-06 MED ORDER — PROPOFOL 10 MG/ML IV BOLUS
INTRAVENOUS | Status: AC
  Filled 2019-10-06: qty 20

## 2019-10-06 MED ORDER — LIDOCAINE 2% (20 MG/ML) 5 ML SYRINGE
INTRAMUSCULAR | Status: DC | PRN
  Administered 2019-10-06: 60 mg via INTRAVENOUS

## 2019-10-06 MED ORDER — EPHEDRINE 5 MG/ML INJ
INTRAVENOUS | Status: AC
  Filled 2019-10-06: qty 10

## 2019-10-06 MED ORDER — FENTANYL CITRATE (PF) 250 MCG/5ML IJ SOLN
INTRAMUSCULAR | Status: AC
  Filled 2019-10-06: qty 5

## 2019-10-06 MED ORDER — MENTHOL 3 MG MT LOZG
1.0000 | LOZENGE | OROMUCOSAL | Status: DC | PRN
  Filled 2019-10-06: qty 9

## 2019-10-06 MED ORDER — METOCLOPRAMIDE HCL 5 MG PO TABS: 5.0000 mg | ORAL_TABLET | Freq: Three times a day (TID) | ORAL | Status: DC | PRN

## 2019-10-06 MED ORDER — CHLORHEXIDINE GLUCONATE CLOTH 2 % EX PADS
6.0000 | MEDICATED_PAD | Freq: Every day | CUTANEOUS | Status: DC
  Administered 2019-10-06: 6 via TOPICAL

## 2019-10-06 SURGICAL SUPPLY — 79 items
BANDAGE ESMARK 6X9 LF (GAUZE/BANDAGES/DRESSINGS) IMPLANT
BIT DRILL CANN LG 4.3MM (BIT) IMPLANT
BLADE CLIPPER SURG (BLADE) IMPLANT
BLADE SURG 15 STRL LF DISP TIS (BLADE) ×1 IMPLANT
BLADE SURG 15 STRL SS (BLADE) ×1
BNDG COHESIVE 4X5 TAN STRL (GAUZE/BANDAGES/DRESSINGS) ×2 IMPLANT
BNDG COHESIVE 6X5 TAN STRL LF (GAUZE/BANDAGES/DRESSINGS) ×2 IMPLANT
BNDG ELASTIC 4X5.8 VLCR STR LF (GAUZE/BANDAGES/DRESSINGS) ×2 IMPLANT
BNDG ELASTIC 6X5.8 VLCR STR LF (GAUZE/BANDAGES/DRESSINGS) ×2 IMPLANT
BNDG ESMARK 6X9 LF (GAUZE/BANDAGES/DRESSINGS)
BNDG GAUZE ELAST 4 BULKY (GAUZE/BANDAGES/DRESSINGS) ×2 IMPLANT
CANISTER SUCT 3000ML PPV (MISCELLANEOUS) ×2 IMPLANT
COVER MAYO STAND STRL (DRAPES) ×3 IMPLANT
COVER PERINEAL POST (MISCELLANEOUS) ×2 IMPLANT
COVER SURGICAL LIGHT HANDLE (MISCELLANEOUS) ×3 IMPLANT
COVER WAND RF STERILE (DRAPES) ×1 IMPLANT
CUFF TOURN SGL QUICK 34 (TOURNIQUET CUFF)
CUFF TOURN SGL QUICK 42 (TOURNIQUET CUFF) IMPLANT
CUFF TRNQT CYL 34X4.125X (TOURNIQUET CUFF) IMPLANT
DRAPE C-ARM 42X72 X-RAY (DRAPES) ×2 IMPLANT
DRAPE HALF SHEET 40X57 (DRAPES) ×4 IMPLANT
DRAPE IMP U-DRAPE 54X76 (DRAPES) ×2 IMPLANT
DRAPE ORTHO SPLIT 77X108 STRL (DRAPES) ×2
DRAPE STERI IOBAN 125X83 (DRAPES) ×2 IMPLANT
DRAPE SURG ORHT 6 SPLT 77X108 (DRAPES) ×2 IMPLANT
DRAPE U-SHAPE 47X51 STRL (DRAPES) ×2 IMPLANT
DRILL BIT CANN LG 4.3MM (BIT) ×2
DRSG PAD ABDOMINAL 8X10 ST (GAUZE/BANDAGES/DRESSINGS) ×2 IMPLANT
DURAPREP 26ML APPLICATOR (WOUND CARE) ×2 IMPLANT
ELECT REM PT RETURN 9FT ADLT (ELECTROSURGICAL) ×2
ELECTRODE REM PT RTRN 9FT ADLT (ELECTROSURGICAL) ×1 IMPLANT
EVACUATOR 1/8 PVC DRAIN (DRAIN) IMPLANT
FACESHIELD WRAPAROUND (MASK) IMPLANT
FACESHIELD WRAPAROUND OR TEAM (MASK) IMPLANT
GAUZE SPONGE 4X4 12PLY STRL (GAUZE/BANDAGES/DRESSINGS) ×4 IMPLANT
GAUZE SPONGE 4X4 16PLY XRAY LF (GAUZE/BANDAGES/DRESSINGS) ×1 IMPLANT
GAUZE XEROFORM 1X8 LF (GAUZE/BANDAGES/DRESSINGS) ×2 IMPLANT
GAUZE XEROFORM 5X9 LF (GAUZE/BANDAGES/DRESSINGS) ×1 IMPLANT
GLOVE BIOGEL PI IND STRL 8 (GLOVE) ×2 IMPLANT
GLOVE BIOGEL PI INDICATOR 8 (GLOVE) ×2
GLOVE ORTHO TXT STRL SZ7.5 (GLOVE) ×4 IMPLANT
GOWN STRL REUS W/ TWL LRG LVL3 (GOWN DISPOSABLE) ×2 IMPLANT
GOWN STRL REUS W/ TWL XL LVL3 (GOWN DISPOSABLE) ×1 IMPLANT
GOWN STRL REUS W/TWL 2XL LVL3 (GOWN DISPOSABLE) ×2 IMPLANT
GOWN STRL REUS W/TWL LRG LVL3 (GOWN DISPOSABLE) ×2
GOWN STRL REUS W/TWL XL LVL3 (GOWN DISPOSABLE) ×1
GUIDE PIN 3.2X343 (PIN) ×1
GUIDE PIN 3.2X343MM (PIN) ×1
GUIDEPIN 3.2X17.5 THRD DISP (PIN) ×1 IMPLANT
KIT BASIN OR (CUSTOM PROCEDURE TRAY) ×2 IMPLANT
KIT TURNOVER KIT B (KITS) ×2 IMPLANT
MANIFOLD NEPTUNE II (INSTRUMENTS) ×2 IMPLANT
NAIL HIP FRACT 130D 11X180 (Screw) ×1 IMPLANT
NDL FILTER BLUNT 18X1 1/2 (NEEDLE) IMPLANT
NEEDLE FILTER BLUNT 18X 1/2SAF (NEEDLE) ×1
NEEDLE FILTER BLUNT 18X1 1/2 (NEEDLE) ×1 IMPLANT
NS IRRIG 1000ML POUR BTL (IV SOLUTION) ×2 IMPLANT
PACK GENERAL/GYN (CUSTOM PROCEDURE TRAY) ×2 IMPLANT
PACK UNIVERSAL I (CUSTOM PROCEDURE TRAY) ×2 IMPLANT
PAD ABD 8X10 STRL (GAUZE/BANDAGES/DRESSINGS) ×1 IMPLANT
PAD ARMBOARD 7.5X6 YLW CONV (MISCELLANEOUS) ×4 IMPLANT
PIN GUIDE 3.2X343MM (PIN) IMPLANT
SCREW ANTI ROTATION 95MM (Screw) ×1 IMPLANT
SCREW BONE CORTICAL 5.0X40 (Screw) ×1 IMPLANT
SCREW DRILL BIT ANIT ROTATION (BIT) ×1 IMPLANT
SCREW LAG 10.5X120MM (Screw) ×1 IMPLANT
STAPLER VISISTAT 35W (STAPLE) ×2 IMPLANT
STOCKINETTE IMPERVIOUS LG (DRAPES) ×1 IMPLANT
SUCTION FRAZIER TIP 8 FR DISP (SUCTIONS) ×1
SUCTION TUBE FRAZIER 8FR DISP (SUCTIONS) IMPLANT
SUT VIC AB 0 CT1 27 (SUTURE) ×1
SUT VIC AB 0 CT1 27XBRD ANBCTR (SUTURE) ×1 IMPLANT
SUT VIC AB 2-0 CT1 27 (SUTURE) ×2
SUT VIC AB 2-0 CT1 TAPERPNT 27 (SUTURE) ×2 IMPLANT
TAPE CLOTH SURG 4X10 WHT LF (GAUZE/BANDAGES/DRESSINGS) ×1 IMPLANT
TOWEL GREEN STERILE (TOWEL DISPOSABLE) ×2 IMPLANT
TOWEL GREEN STERILE FF (TOWEL DISPOSABLE) ×2 IMPLANT
TRAY FOLEY MTR SLVR 16FR STAT (SET/KITS/TRAYS/PACK) ×1 IMPLANT
WATER STERILE IRR 1000ML POUR (IV SOLUTION) ×1 IMPLANT

## 2019-10-06 NOTE — Anesthesia Procedure Notes (Signed)
Procedure Name: LMA Insertion Date/Time: 10/06/2019 11:46 AM Performed by: Scheryl Darter, CRNA Pre-anesthesia Checklist: Patient identified, Emergency Drugs available, Suction available and Patient being monitored Patient Re-evaluated:Patient Re-evaluated prior to induction Oxygen Delivery Method: Circle System Utilized Preoxygenation: Pre-oxygenation with 100% oxygen Induction Type: IV induction Ventilation: Mask ventilation without difficulty LMA: LMA inserted LMA Size: 5.0 Number of attempts: 1 Airway Equipment and Method: Bite block Placement Confirmation: positive ETCO2 Tube secured with: Tape Dental Injury: Teeth and Oropharynx as per pre-operative assessment

## 2019-10-06 NOTE — Plan of Care (Signed)

## 2019-10-06 NOTE — Anesthesia Preprocedure Evaluation (Addendum)
Anesthesia Evaluation  Patient identified by MRN, date of birth, ID band Patient awake    Reviewed: Allergy & Precautions, NPO status , Patient's Chart, lab work & pertinent test results  History of Anesthesia Complications Negative for: history of anesthetic complications  Airway Mallampati: II  TM Distance: >3 FB Neck ROM: Full    Dental  (+) Dental Advisory Given   Pulmonary neg pulmonary ROS,    Pulmonary exam normal        Cardiovascular hypertension, Pt. on medications Normal cardiovascular exam     Neuro/Psych negative neurological ROS  negative psych ROS   GI/Hepatic Neg liver ROS,  Pancreatic neoplasm    Endo/Other  negative endocrine ROS  Renal/GU negative Renal ROS     Musculoskeletal  (+) Arthritis ,   Abdominal   Peds  Hematology  (+) anemia ,  Thrombocytopenia    Anesthesia Other Findings Covid neg 1/16   Reproductive/Obstetrics                            Anesthesia Physical Anesthesia Plan  ASA: III  Anesthesia Plan: General   Post-op Pain Management:    Induction: Intravenous  PONV Risk Score and Plan: 2 and Treatment may vary due to age or medical condition and Ondansetron  Airway Management Planned: LMA  Additional Equipment: None  Intra-op Plan:   Post-operative Plan: Extubation in OR  Informed Consent: I have reviewed the patients History and Physical, chart, labs and discussed the procedure including the risks, benefits and alternatives for the proposed anesthesia with the patient or authorized representative who has indicated his/her understanding and acceptance.     Dental advisory given  Plan Discussed with: CRNA and Anesthesiologist  Anesthesia Plan Comments:        Anesthesia Quick Evaluation

## 2019-10-06 NOTE — Interval H&P Note (Signed)
History and Physical Interval Note:  10/06/2019 11:09 AM  [name] [name]  has presented today for surgery, with the diagnosis of LEFT INTERTROCHANTERIC FRACTURE/ LEFT RETROGRADE FEMORAL NAIL.  The various methods of treatment have been discussed with the patient and family. After consideration of risks, benefits and other options for treatment, the patient has consented to  Procedure(s): INTRAMEDULLARY (IM) RETROGRADE FEMORAL NAIL REMOVAL ( [employer]) (Left) BIOMET AFFIXUS SHORT NAIL,  INTERTROCHANTRIC (Left) as a surgical intervention.  [name] history has been reviewed, patient examined, no change in status, stable for surgery.  I have reviewed the patient's chart and labs.  Questions were answered to the patient's satisfaction.     Marybelle Killings

## 2019-10-06 NOTE — Transfer of Care (Signed)
Immediate Anesthesia Transfer of Care Note  Patient: [name] [name]  Procedure(s) Performed: INTRAMEDULLARY (IM) RETROGRADE FEMORAL NAIL REMOVAL [facility] [employer] NEPHEW) (Left ) BIOMET AFFIXUS SHORT NAIL,  INTERTROCHANTRIC (Left )  Patient Location: PACU  Anesthesia Type:General  Level 222of Consciousness: awake, alert , oriented and sedated  Airway & Oxygen Therapy: Patient Spontanous Breathing and Patient connected to face mask oxygen  Post-op Assessment: stable  Post vital signs: Reviewed and stable  Last Vitals:  Vitals Value Taken Time  BP 163/79 10/06/19 1357  Temp 36.5 C 10/06/19 1357  Pulse 98 10/06/19 1400  Resp 16 10/06/19 1400  SpO2 97 % 10/06/19 1400  Vitals shown include unvalidated device data.  Last Pain:  Vitals:   10/06/19 0843  TempSrc:   PainSc: 5       Patients Stated Pain Goal: 2 ([id number])  Complications: No apparent anesthesia complications

## 2019-10-06 NOTE — Progress Notes (Signed)
PROGRESS NOTE  [name] [name] [id number] DOB: 1940/11/18 DOA: 10/04/2019 PCP: [name], MD   LOS: 2 days   Brief Narrative / Interim history: 79 year old male with NHL in remission, HTN, previous hip repair surgeries, admitted 1/16 with left hip fracture. This was following ground-level fall, no syncope but tripped and fell on the left side. He reported immediate severe pain in the left hip and inability to ambulate. X-ray on admission showed comminuted intertrochanteric fracture of the distal femur with pronounced fragmentation and partial retraction of the lesser trochanter. Orthopedic surgery was consulted  Subjective / 24h Interval events: He is doing well this morning, pain is better and usually not a problem unless he tries to move. He is awaiting surgery this morning  Assessment & Plan: Principal Problem Left hip fracture -Dr. Lorin Mercy with orthopedic surgery consulted, patient will be taken to the OR this morning -Keep n.p.o. -PT postop -Monitor for blood loss anemia  Active Problems Essential hypertension -Hold lisinopril, resume as tolerated  Non-Hodgkin's lymphoma -In remission  Scheduled Meds: . [MAR Hold] Chlorhexidine Gluconate Cloth  6 each Topical Daily  . [MAR Hold] enoxaparin (LOVENOX) injection  40 mg Subcutaneous Q24H  . [MAR Hold] hydrochlorothiazide  25 mg Oral Daily   And  . [MAR Hold] lisinopril  40 mg Oral Daily  . povidone-iodine  2 application Topical Once   Continuous Infusions: . sodium chloride 75 mL/hr at 10/05/19 2103  . lactated ringers     PRN Meds:.[MAR Hold] acetaminophen, fentaNYL (SUBLIMAZE) injection, [MAR Hold]  HYDROmorphone (DILAUDID) injection, ondansetron (ZOFRAN) IV, [MAR Hold] oxyCODONE  DVT prophylaxis: Lovenox Code Status: Full code Family Communication: d/w patient, no family at bedside  Patient admitted from: home Anticipated d/c place: home vs SNF Barriers to d/c: surgery   Consultants:  Orthopedic surgery    Procedures:  Hip fracture repair - pending  Microbiology  SARS-CoV-2 1/16-negative MRSA PCR 1/16-negative  Antimicrobials: None   Objective: Vitals:   10/05/19 1941 10/06/19 0301 10/06/19 0842 10/06/19 1357  BP: (!) 144/71 111/80 123/65 (!) 163/79  Pulse: 85 91 90 95  Resp:   18 16  Temp: 98 F (36.7 C) 97.9 F (36.6 C) 98 F (36.7 C) 97.7 F (36.5 C)  TempSrc: Oral Oral Oral   SpO2: 95% 96% 96% 100%  Weight:      Height:        Intake/Output Summary (Last 24 hours) at 10/06/2019 1406 Last data filed at 10/06/2019 1400 Gross per 24 hour  Intake 1490 ml  Output 5200 ml  Net -3710 ml   Filed Weights   10/05/19 0800  Weight: 86.5 kg    Examination:  Constitutional: NAD Eyes: no scleral icterus ENMT: Mucous membranes are moist.  Neck: normal, supple Respiratory: clear to auscultation bilaterally, no wheezing, no crackles. Normal respiratory effort. Cardiovascular: Regular rate and rhythm, no murmurs / rubs / gallops. No LE edema.  Abdomen: non distended, no tenderness.  Musculoskeletal: no clubbing / cyanosis.  Skin: no rashes Neurologic: Nonfocal   Data Reviewed: I have independently reviewed following labs and imaging studies   CBC: Recent Labs  Lab 10/04/19 1541 10/05/19 0606 10/06/19 0437  WBC 8.3 11.1* 9.4  NEUTROABS 6.3  --   --   HGB 15.6 13.2 12.3*  HCT 48.4 40.6 38.2*  MCV 89.3 88.5 89.3  PLT 180 167 0000000*   Basic Metabolic Panel: Recent Labs  Lab 10/04/19 1541 10/05/19 0606 10/06/19 0437  NA 136 139 137  K 3.8 4.0 4.0  CL 104 106 102  CO2 20* 24 27  GLUCOSE 156* 127* 161*  BUN 16 17 12   CREATININE 1.23 1.14 1.16  CALCIUM 9.1 9.0 8.7*  MG  --  2.0  --    Liver Function Tests: Recent Labs  Lab 10/04/19 1541  AST 22  ALT 20  ALKPHOS 57  BILITOT 1.4*  PROT 6.9  ALBUMIN 4.1   Coagulation Profile: No results for input(s): INR, PROTIME in the last 168 hours. HbA1C: No results for input(s): HGBA1C in the last 72  hours. CBG: No results for input(s): GLUCAP in the last 168 hours.  Recent Results (from the past 240 hour(s))  Respiratory Panel by RT PCR (Flu A&B, Covid) - Nasopharyngeal Swab     Status: None   Collection Time: 10/04/19  3:41 PM   Specimen: Nasopharyngeal Swab  Result Value Ref Range Status   SARS Coronavirus 2 by RT PCR NEGATIVE NEGATIVE Final    Comment: (NOTE) SARS-CoV-2 target nucleic acids are NOT DETECTED. The SARS-CoV-2 RNA is generally detectable in upper respiratoy specimens during the acute phase of infection. The lowest concentration of SARS-CoV-2 viral copies this assay can detect is 131 copies/mL. A negative result does not preclude SARS-Cov-2 infection and should not be used as the sole basis for treatment or other patient management decisions. A negative result may occur with  improper specimen collection/handling, submission of specimen other than nasopharyngeal swab, presence of viral mutation(s) within the areas targeted by this assay, and inadequate number of viral copies (<131 copies/mL). A negative result must be combined with clinical observations, patient history, and epidemiological information. The expected result is Negative. Fact Sheet for Patients:  PinkCheek.be Fact Sheet for Healthcare Providers:  GravelBags.it This test is not yet ap proved or cleared by the [place] FDA and  has been authorized for detection and/or diagnosis of SARS-CoV-2 by FDA under an Emergency Use Authorization (EUA). This EUA will remain  in effect (meaning this test can be used) for the duration of the COVID-19 declaration under Section 564(b)(1) of the Act, 21 U.S.C. section 360bbb-3(b)(1), unless the authorization is terminated or revoked sooner.    Influenza A by PCR NEGATIVE NEGATIVE Final   Influenza B by PCR NEGATIVE NEGATIVE Final    Comment: (NOTE) The Xpert Xpress SARS-CoV-2/FLU/RSV assay is intended  as an aid in  the diagnosis of influenza from Nasopharyngeal swab specimens and  should not be used as a sole basis for treatment. Nasal washings and  aspirates are unacceptable for Xpert Xpress SARS-CoV-2/FLU/RSV  testing. Fact Sheet for Patients: PinkCheek.be Fact Sheet for Healthcare Providers: GravelBags.it This test is not yet approved or cleared by the [place] FDA and  has been authorized for detection and/or diagnosis of SARS-CoV-2 by  FDA under an Emergency Use Authorization (EUA). This EUA will remain  in effect (meaning this test can be used) for the duration of the  Covid-19 declaration under Section 564(b)(1) of the Act, 21  U.S.C. section 360bbb-3(b)(1), unless the authorization is  terminated or revoked. Performed at [facility], [place] [address] [place],  [address]   MRSA PCR Screening     Status: None   Collection Time: 10/04/19  9:50 PM   Specimen: Nasal Mucosa; Nasopharyngeal  Result Value Ref Range Status   MRSA by PCR NEGATIVE NEGATIVE Final    Comment:        The GeneXpert MRSA Assay (FDA approved for NASAL specimens only), is one component of a comprehensive MRSA  colonization surveillance program. It is not intended to diagnose MRSA infection nor to guide or monitor treatment for MRSA infections. Performed at [facility], [place] [address] [place], [place] [address]      Radiology Studies: DG C-Arm 1-60 Min  Result Date: 10/06/2019 CLINICAL DATA:  Left hip intramedullary nail placement FLUOROSCOPY TIME:  1 minutes 34 seconds. Images: 4 EXAM: DG HIP (WITH OR WITHOUT PELVIS) 2-3V LEFT; DG C-ARM 1-60 MIN COMPARISON:  October 04, 2019 FINDINGS: An intramedullary rod has been placed in the proximal left femur. A distal interlocking screw has been placed. Two gamma nails are in place. IMPRESSION: Left hip fracture repair as above. Electronically Signed   By: Dorise Bullion III  M.D   On: 10/06/2019 13:43   DG HIP UNILAT WITH PELVIS 2-3 VIEWS LEFT  Result Date: 10/06/2019 CLINICAL DATA:  Left hip intramedullary nail placement FLUOROSCOPY TIME:  1 minutes 34 seconds. Images: 4 EXAM: DG HIP (WITH OR WITHOUT PELVIS) 2-3V LEFT; DG C-ARM 1-60 MIN COMPARISON:  October 04, 2019 FINDINGS: An intramedullary rod has been placed in the proximal left femur. A distal interlocking screw has been placed. Two gamma nails are in place. IMPRESSION: Left hip fracture repair as above. Electronically Signed   By: Dorise Bullion III M.D   On: 10/06/2019 13:43   Marzetta Board, MD, PhD [facility]  Between 7 am - 7 pm I am available, please contact me via Amion or Securechat  Between 7 pm - 7 am I am not available, please contact night coverage MD/APP via Amion

## 2019-10-06 NOTE — Anesthesia Postprocedure Evaluation (Signed)
Anesthesia Post Note  Patient: Chapman Fitch  Procedure(s) Performed: INTRAMEDULLARY (IM) RETROGRADE FEMORAL NAIL REMOVAL ( [employer]) (Left ) BIOMET AFFIXUS SHORT NAIL,  INTERTROCHANTRIC (Left )     Patient location during evaluation: PACU Anesthesia Type: General Level of consciousness: awake and alert Pain management: pain level controlled Vital Signs Assessment: post-procedure vital signs reviewed and stable Respiratory status: spontaneous breathing, nonlabored ventilation, respiratory function stable and patient connected to nasal cannula oxygen Cardiovascular status: blood pressure returned to baseline and stable Postop Assessment: no apparent nausea or vomiting Anesthetic complications: no    Last Vitals:  Vitals:   10/06/19 0842 10/06/19 1357  BP: 123/65 (!) 163/79  Pulse: 90 95  Resp: 18 16  Temp: 36.7 C 36.5 C  SpO2: 96% 100%    Last Pain:  Vitals:   10/06/19 1357  TempSrc:   PainSc: Asleep                 Dory Verdun S

## 2019-10-06 NOTE — Op Note (Signed)
Preop diagnosis: Left intertrochanteric hip fracture with previous retrograde femoral interlocking nail.  Postop diagnosis: Same  Procedure: Removal of retrograde locked left femoral nail.  [place].  Placement of left [employer] affixes trochanteric nail 11 mm short with 120 lag screw and 40 mm interlock.  Surgeon: Rodell Perna, MD  Assistant: Benjiman Core, PA-C medically necessary and present for the entire procedure.  EBL 200 cc  Anesthesia: General LMA.  Procedure after induction of general anesthesia airway management preoperative Ancef prophylaxis timeout procedure Unna boot application on the Hana table patient was placed with the Hana boot with his leg in a flexed position.  Leg was prepped and well leg holder was used the opposite leg.  Prepping with DuraPrep from above the iliac crest down to the mid calf was performed.  Split sheets drapes were applied.  Timeout procedure completed.  Patient had an old retrograde femoral nail which was distally locked but not proximally locked.  This extended to the subcortical region and was in the way for any type of device to secure his intertrochanteric hip fracture.  Small incision was made laterally and using the C arm we continued until the interlock nail was identified and it was removed.  Tensor fascia was repaired 2-0 Vicryl ~subtendinous tissue skin staple closure.  While this is being closed by Benjiman Core, PA incision was made using the old scar midline splitting patellar tendon removing some retropatellar fat and then using a small pin to pass up the middle of the rod.  Over reaming was attempted and osteotome had to be used to remove some bone so that nail remover could finally be screwed in.  This took probably 45 minutes of work just to get the nail and gauge so it could be extracted and then with difficulty after 30 minutes of pounding following the nail was removed.  There was some bone that had grown through the proximal screw  holes.  Area was irrigated patella tendon repaired ~subtendinous tissue skin staple closure of the knee 4 x 4's and an Ace wrap was applied.  Leg was then brought out to full extension I broke scrub and performed reduction of the intertrochanteric hip fracture with distraction internal rotation brought out to length checked AP and lateral and then large shower curtain Betadine Steri-Drape was applied.  Standard approach was then used making incision proximal trochanter splitting the gluteus medius and on the tip of the trochanter placing the pin drilling it over reaming it and then passing the 11 mm short nail down rotating it as it progressed from 90 degrees internal rotation.  Yellow guide was screwed in small incision was made placed against the cortex.  It was drilled up the center center in the head measured 125 mm 120 nail length selected.  It was drilled nail placed and then the derotational screw 20 mm less in length at 100 mm was then placed.  Final spot pictures were taken AP and lateral good position alignment patient tolerated procedure well irrigation standard layered closure with gluteus medius closed with #1 Vicryl 2 on the subtenons tissue skin staple closure and skin staples in the suture 2 small incisions for the transverse locking screw and also for the lag screw entry point.  Final spot pictures AP and lateral were taken.  Patient was then transferred care room in stable condition.

## 2019-10-07 ENCOUNTER — Encounter: Payer: Self-pay | Admitting: *Deleted

## 2019-10-07 DIAGNOSIS — C8251 Diffuse follicle center lymphoma, lymph nodes of head, face, and neck: Secondary | ICD-10-CM

## 2019-10-07 LAB — BASIC METABOLIC PANEL
Anion gap: 7 (ref 5–15)
BUN: 11 mg/dL (ref 8–23)
CO2: 26 mmol/L (ref 22–32)
Calcium: 8.2 mg/dL — ABNORMAL LOW (ref 8.9–10.3)
Chloride: 103 mmol/L (ref 98–111)
Creatinine, Ser: 1.18 mg/dL (ref 0.61–1.24)
GFR calc Af Amer: >60 mL/min (ref >60)
GFR calc non Af Amer: 58 mL/min — ABNORMAL LOW (ref >60)
Glucose, Bld: 113 mg/dL — ABNORMAL HIGH (ref 70–99)
Potassium: 3.6 mmol/L (ref 3.5–5.1)
Sodium: 136 mmol/L (ref 135–145)

## 2019-10-07 LAB — CBC
HCT: 31.3 % — ABNORMAL LOW (ref 39.0–52.0)
Hemoglobin: 10.1 g/dL — ABNORMAL LOW (ref 13.0–17.0)
MCH: 28.8 pg (ref 26.0–34.0)
MCHC: 32.3 g/dL (ref 30.0–36.0)
MCV: 89.2 fL (ref 80.0–100.0)
Platelets: 136 10*3/uL — ABNORMAL LOW (ref 150–400)
RBC: 3.51 MIL/uL — ABNORMAL LOW (ref 4.22–5.81)
RDW: 14 % (ref 11.5–15.5)
WBC: 8.4 10*3/uL (ref 4.0–10.5)
nRBC: 0 % (ref 0.0–0.2)

## 2019-10-07 MED ORDER — ASPIRIN 325 MG PO TABS
325.0000 mg | ORAL_TABLET | Freq: Every day | ORAL | Status: DC
  Administered 2019-10-07 – 2019-10-11 (×5): 325 mg via ORAL
  Filled 2019-10-07 (×5): qty 1

## 2019-10-07 NOTE — Evaluation (Signed)
Occupational Therapy Evaluation Patient Details Name: [name] [name] MRN: [id number] DOB: 1941-03-31 Today's Date: 10/07/2019    History of Present Illness 79 year old male with history of lymphoma previous femur fractures treated with intramedullary rods and mechanical fall with left intertrochanteric hip fracture.  Previous femur fractures were fixed with retrograde rods and his left hip fracture is above the left intramedullary femoral nail that ends in the subtroches region.  Patient has history of hypertension.Pt now s/p IM nail 10/06/19   Clinical Impression   This 79 yo male admitted and underwent above is normally lives a lone and is totally independent with basic and IADLs as well as driving. Currently pt is setup for UB ADLs, but Mod A-total A for LB ADLs, bed mobility, and transfers. He will benefit from acute OT with follow up on CIR to get to an intermittent S or better to return home.     Follow Up Recommendations  CIR;Supervision - Intermittent(if he went home from acute care he would need 24 hour S/prn A)    Equipment Recommendations  3 in 1 bedside commode       Precautions / Restrictions Precautions Precautions: Fall Restrictions Weight Bearing Restrictions: Yes LLE Weight Bearing: Partial weight bearing LLE Partial Weight Bearing Percentage or Pounds: 50      Mobility Bed Mobility Overal bed mobility: Needs Assistance Bed Mobility: Supine to Sit     Supine to sit: Mod assist     General bed mobility comments: LLE and to swing around to sit EOB as well as VCs for sequencing with HOB up  Transfers Overall transfer level: Needs assistance Equipment used: Rolling walker (2 wheeled) Transfers: Sit to/from [facility] Sit to Stand: Mod assist;From elevated surface Stand pivot transfers: Mod assist;From elevated surface       General transfer comment: VCs for safe hand placement and to sequence steps, pt not wanting to put any weight on LLE  (even though I kept cuing him that he could put up to 50% of weight on it)    Balance Overall balance assessment: Needs assistance Sitting-balance support: Single extremity supported;Feet supported Sitting balance-Leahy Scale: Poor Sitting balance - Comments: Pt wanting to sit more on his right hip and foreward, than symmetrical at EOB, used on hand to support self   Standing balance support: Bilateral upper extremity supported Standing balance-Leahy Scale: Poor Standing balance comment: Pt with tendency for anterior lean once up on his feet                           ADL either performed or assessed with clinical judgement   ADL Overall ADL's : Needs assistance/impaired Eating/Feeding: Independent;Sitting Eating/Feeding Details (indicate cue type and reason): in recliner Grooming: Set up;Sitting Grooming Details (indicate cue type and reason): in recliner Upper Body Bathing: Set up;Sitting Upper Body Bathing Details (indicate cue type and reason): recliner Lower Body Bathing: Total assistance Lower Body Bathing Details (indicate cue type and reason): Mod A sit<>stand from raised bed Upper Body Dressing : Set up;Standing Upper Body Dressing Details (indicate cue type and reason): in recliner Lower Body Dressing: Total assistance Lower Body Dressing Details (indicate cue type and reason): Mod A sit<>stand from raised bed Toilet Transfer: Moderate assistance;Stand-pivot;RW Toilet Transfer Details (indicate cue type and reason): bed (raised)>recliner going to pt's right Toileting- Clothing Manipulation and Hygiene: Total assistance Toileting - Clothing Manipulation Details (indicate cue type and reason): Mod A sit<>stand from raised bed  Vision Patient Visual Report: No change from baseline              Pertinent Vitals/Pain Pain Assessment: 0-10 Pain Score: 6  Pain Location: LLE Pain Descriptors / Indicators: Aching;Sore Pain Intervention(s):  Limited activity within patient's tolerance;Monitored during session;Premedicated before session(RN gave IV pain meds at beginning of session)     Hand Dominance Right   Extremity/Trunk Assessment Upper Extremity Assessment Upper Extremity Assessment: Overall WFL for tasks assessed;LUE deficits/detail LUE Deficits / Details: Does have limited shoulder AROM from previous injury           Communication Communication Communication: No difficulties   Cognition Arousal/Alertness: Awake/alert Behavior During Therapy: WFL for tasks assessed/performed Overall Cognitive Status: Within Functional Limits for tasks assessed                                        Exercises Other Exercises Other Exercises: Pt reporting he felt like his LLE was stuck to bed covers--so I had him work on leg exercises to get it loosened (hip adduction/abduction, straight leg raise, and knee flexion/extension)--5 reps of each        Home Living Family/patient expects to be discharged to:: Inpatient rehab Living Arrangements: Alone Available Help at Discharge: Family;Available PRN/intermittently Type of Home: House Home Access: Stairs to enter [employer] of Steps: 2 (he has 3 4" solid concrete blocks not concreted in, then step up on to porch); but he says he can stay in the basement (level entry) where he has a hospital bed and will get a convection oven and refrigerator put down there. Entrance Stairs-Rails: None Home Layout: Two level Alternate Level Stairs-Number of Steps: basement (can enter basement on level ground at car port)   Bathroom Shower/Tub: Walk-in shower;Door   Bathroom Toilet: Handicapped height         Additional Comments: unsure if he still has RW      Prior Functioning/Environment Level of Independence: Independent                 OT Problem List: Decreased strength;Decreased range of motion;Impaired balance (sitting and/or standing);Pain       OT Treatment/Interventions: Self-care/ADL training;DME and/or AE instruction;Patient/family education;Balance training    OT Goals(Current goals can be found in the care plan section) Acute Rehab OT Goals Patient Stated Goal: to go to inpatient rehab then home (he [facility] at [facility]) OT Goal Formulation: With patient Time For Goal Achievement: 10/21/19 Potential to Achieve Goals: Good  OT Frequency: Min 2X/week   Barriers to D/C: Decreased caregiver support             AM-PAC OT "6 Clicks" Daily Activity     Outcome Measure Help from another person eating meals?: None Help from another person taking care of personal grooming?: A Little Help from another person toileting, which includes using toliet, bedpan, or urinal?: A Lot Help from another person bathing (including washing, rinsing, drying)?: A Lot Help from another person to put on and taking off regular upper body clothing?: A Little Help from another person to put on and taking off regular lower body clothing?: Total 6 Click Score: 15   End of Session Equipment Utilized During Treatment: Gait belt;Rolling walker Nurse Communication: Mobility status;Weight bearing status(NT: mod A +1 to strong side stand pivot and pt not wanting to put weight on LLE even though he is  50% weightbearing)  Activity Tolerance: Patient tolerated treatment well Patient left: in chair;with call bell/phone within reach;with chair alarm set  OT Visit Diagnosis: Unsteadiness on feet (R26.81);Other abnormalities of gait and mobility (R26.89);Muscle weakness (generalized) (M62.81);Pain Pain - Right/Left: Left Pain - part of body: Leg                Time: [id number] OT Time Calculation (min): 46 min Charges:  OT General Charges $OT Visit: 1 Visit OT Evaluation $OT Eval Moderate Complexity: 1 Mod OT Treatments $Self Care/Home Management : 23-37 mins  Cathy OTR/L Acute [employer] Pager [phone] Office  [phone]    10/07/2019, 9:03 AM

## 2019-10-07 NOTE — Progress Notes (Signed)
Dressing changed per order. Pt tolerated well.

## 2019-10-07 NOTE — Evaluation (Signed)
Physical Therapy Evaluation Patient Details Name: [name] [name] MRN: [id number] DOB: [birth year] Today's Date: 10/07/2019   History of Present Illness  79 year old male with history of lymphoma previous femur fractures treated with intramedullary rods and mechanical fall with left intertrochanteric hip fracture.  Previous femur fractures were fixed with retrograde rods and his left hip fracture is above the left intramedullary femoral nail that ends in the subtroches region.  Patient has history of hypertension.Pt now s/p IM nail 10/06/19  Clinical Impression  Pt is a 79 yo male admitted for above. Pt received in bed and agreeable to PT. Pt lives alone and previously independent with ADLs/IADLs. Pt required min-mod A to get EOB and mod A to achieve upright standing. Pt educated on weight bearing precautions and pt able to maintain during mobility. Min A to ambulate a few feet within the room. Pt ambulating with heavy reliance on BUE support from RW. Pt instructed on supine LE therex to perform on his own as HEP in order to continue progressing L LE strength and ROM. Pt limited this session by increased pain. Pt reporting he was able to sit up in recliner for several hours this morning after OT eval. Pt presents with decreased strength, ROM, balance, power and activity tolerance consistent with recent surgery. Pt will benefit from continued acute PT to improve deficits and recommendation for CIR following acute discharge in order to maximize functional mobility and allow for return to PLOF.     Follow Up Recommendations CIR;Other (comment)(if not able to get CIR then SNF)    Equipment Recommendations  Rolling walker with 5" wheels    Recommendations for Other Services       Precautions / Restrictions Precautions Precautions: Fall Restrictions Weight Bearing Restrictions: Yes LLE Weight Bearing: Partial weight bearing LLE Partial Weight Bearing Percentage or Pounds: 50      Mobility  Bed  Mobility Overal bed mobility: Needs Assistance Bed Mobility: Supine to Sit;Sit to Supine     Supine to sit: HOB elevated;Mod assist;Min assist Sit to supine: HOB elevated;Mod assist;Min assist   General bed mobility comments: min-mod A to get EOB and return to supine, assist for LE advancement, pt requiring increased time and effort, utilized bed rails, limited by pain, able to scoot up in bed with use of RLE and pulling up using top bed rail  Transfers Overall transfer level: Needs assistance Equipment used: Rolling walker (2 wheeled) Transfers: Sit to/from Stand Sit to Stand: Mod assist;From elevated surface         General transfer comment: mod A to power to full standing, cuing for hand placement and technique, pt WBing through LLE and appearing to maintain precaution throughout session  Ambulation/Gait Ambulation/Gait assistance: Min assist Gait Distance (Feet): 8 Feet Assistive device: Rolling walker (2 wheeled) Gait Pattern/deviations: Step-to pattern;Decreased stride length;Decreased weight shift to left;Decreased stance time - left;Antalgic Gait velocity: decreased   General Gait Details: pt ambulated a few feet and turned around and back to bed, very heavy reliance on RW for UE support and to assist with maintaining WBing precautions, pt reporting increased pain with ambulation, pt educated on maintaining WBing during ambulation with RW, required standing rest due to UE fatigue  Stairs            Wheelchair Mobility    Modified Rankin (Stroke Patients Only)       Balance Overall balance assessment: Needs assistance Sitting-balance support: Single extremity supported;Feet supported Sitting balance-Leahy Scale: Fair Sitting balance - Comments: steady  EOB, pt wanting to sit more on right hip due to pain on Left     Standing balance-Leahy Scale: Fair Standing balance comment: pt reliant on UE support from RW                              Pertinent Vitals/Pain Pain Assessment: Faces Faces Pain Scale: Hurts even more Pain Location: LLE Pain Descriptors / Indicators: Aching;Sore;Grimacing Pain Intervention(s): Limited activity within patient's tolerance;Monitored during session;Premedicated before session;Repositioned    Home Living Family/patient expects to be discharged to:: Inpatient rehab Living Arrangements: Alone Available Help at Discharge: Family;Available PRN/intermittently Type of Home: House Home Access: Stairs to enter Entrance Stairs-Rails: None Entrance Stairs-Number of Steps: 2 (he has 3 4" solid concrete blocks not concreted in, then step up on to porch); but he says he can stay in the basement (level entry) where he has a hospital bed and will get a convection oven and refrigerator put down there. Home Layout: Two level   Additional Comments: unsure if he still has RW    Prior Function Level of Independence: Independent               Hand Dominance   Dominant Hand: Right    Extremity/Trunk Assessment   Upper Extremity Assessment Upper Extremity Assessment: Defer to OT evaluation;Overall [facility] for tasks assessed    Lower Extremity Assessment Lower Extremity Assessment: Overall WFL for tasks assessed;LLE deficits/detail LLE Deficits / Details: dec strength, ROM and balance consistent with current injury LLE: Unable to fully assess due to pain    Cervical / Trunk Assessment Cervical / Trunk Assessment: Normal  Communication   Communication: No difficulties  Cognition Arousal/Alertness: Awake/alert Behavior During Therapy: WFL for tasks assessed/performed Overall Cognitive Status: Within Functional Limits for tasks assessed                                        General Comments General comments (skin integrity, edema, etc.): VSS    Exercises Total Joint Exercises Ankle Circles/Pumps: AROM;Both;10 reps Quad Sets: AROM;Left;10 reps Heel Slides: AAROM;Left;10  reps   Assessment/Plan    PT Assessment Patient needs continued PT services  PT Problem List Decreased strength;Decreased mobility;Decreased range of motion;Decreased safety awareness;Decreased activity tolerance;Decreased balance;Decreased knowledge of use of DME;Pain       PT Treatment Interventions DME instruction;Therapeutic exercise;Gait training;Balance training;Stair training;Neuromuscular re-education;Functional mobility training;Therapeutic activities;Patient/family education    PT Goals (Current goals can be found in the Care Plan section)  Acute Rehab PT Goals Patient Stated Goal: go to rehab PT Goal Formulation: With patient Time For Goal Achievement: 10/21/19 Potential to Achieve Goals: Good    Frequency Min 5X/week   Barriers to discharge Decreased caregiver support;Inaccessible home environment      Co-evaluation               AM-PAC PT "6 Clicks" Mobility  Outcome Measure Help needed turning from your back to your side while in a flat bed without using bedrails?: A Little Help needed moving from lying on your back to sitting on the side of a flat bed without using bedrails?: A Little Help needed moving to and from a bed to a chair (including a wheelchair)?: A Lot Help needed standing up from a chair using your arms (e.g., wheelchair or bedside chair)?: A Lot Help needed to walk in hospital  room?: A Little Help needed climbing 3-5 steps with a railing? : Total 6 Click Score: 14    End of Session Equipment Utilized During Treatment: Gait belt Activity Tolerance: Patient tolerated treatment well;Patient limited by pain Patient left: in bed;with call bell/phone within reach;with bed alarm set Nurse Communication: Mobility status PT Visit Diagnosis: Difficulty in walking, not elsewhere classified (R26.2);Other abnormalities of gait and mobility (R26.89);Pain Pain - Right/Left: Left Pain - part of body: Hip    Time: [id number] PT Time Calculation (min)  (ACUTE ONLY): 31 min   Charges:   PT Evaluation $PT Eval Moderate Complexity: 1 Mod PT Treatments $Therapeutic Activity: 23-37 mins        Adrienne Trombetta PT, DPT 4:08 PM,10/07/19   Rhona Fusilier Drucilla Chalet 10/07/2019, 4:03 PM

## 2019-10-07 NOTE — Progress Notes (Signed)
PROGRESS NOTE  [name] [name] [id number] DOB: [birth year] DOA: 10/04/2019 PCP: [name], MD   LOS: 3 days   Brief Narrative / Interim history: [name] [name] 79 year old male with PMHx NHL in remission, HTN, previous hip repair surgeries, admitted 1/16 with left hip fracture. This was following ground-level fall, no syncope but tripped and fell on the left side. He reported immediate severe pain in the left hip and inability to ambulate. X-ray on admission showed comminuted intertrochanteric fracture of the distal femur with pronounced fragmentation and partial retraction of the lesser trochanter. Orthopedic surgery was consulted  Subjective / 24h Interval events: Patient seen and examined at bedside, resting comfortably.  Pain well controlled.  Underwent ORIF with IMN by orthopedics yesterday.  Surgical site with some oozing overnight, controlled with fourth month with ABD pads.  Denies headache, no fever/chills/night sweats, no nausea/vomiting/diarrhea, no chest pain, no palpitations, no shortness of breath, no abdominal pain, no weakness, no fatigue, no paresthesias.  No other acute events overnight per nursing site.  Assessment & Plan:  Left hip fracture Patient presenting from home following mechanical fall.  X-ray left hip/pelvis with comminuted intratrochanteric fracture left femur with pronounced fragmentation and partial retraction of the lesser trochanter.  Underwent ORIF with IM nail by Dr. Lorin Mercy on 10/06/2019. --Continue PT/OT efforts; Partial weightbearing 50% left lower extremity --Aspirin 325 mg p.o. daily for DVT prophylaxis per orthopedics --Pain control with Tylenol, oxycodone and Dilaudid IV prn  Essential hypertension --Continue home HCTZ and lisinopril  Non-Hodgkin's lymphoma Currently in remission.  Continue outpatient follow-up with oncology.  Scheduled Meds: . aspirin  325 mg Oral Daily  . docusate sodium  100 mg Oral BID  . hydrochlorothiazide  25 mg  Oral Daily   And  . lisinopril  40 mg Oral Daily   Continuous Infusions: . lactated ringers     PRN Meds:.acetaminophen, HYDROmorphone (DILAUDID) injection, menthol-cetylpyridinium **OR** phenol, metoCLOPramide **OR** metoCLOPramide (REGLAN) injection, ondansetron **OR** ondansetron (ZOFRAN) IV, oxyCODONE  DVT prophylaxis: Aspirin 325 mg p.o. daily Code Status: Full code Family Communication: d/w patient, no family at bedside  Patient admitted from: home Anticipated d/c place: home vs SNF  Consultants:  Orthopedic surgery - Dr. Lorin Mercy  Procedures:  ORIF/IMN - Dr. Lorin Mercy - 10/06/2019  Microbiology  SARS-CoV-2 1/16-negative MRSA PCR 1/16-negative  Antimicrobials: None   Objective: Vitals:   10/07/19 0025 10/07/19 0351 10/07/19 0804 10/07/19 1350  BP: 129/63 107/63 127/68 (!) 123/54  Pulse: (!) 102 99 96 90  Resp: 18 14 16 15   Temp: 98.7 F (37.1 C) 97.8 F (36.6 C) 98.2 F (36.8 C) 97.6 F (36.4 C)  TempSrc: Oral Oral Oral Oral  SpO2: 99% 96% 97% 99%  Weight:      Height:        Intake/Output Summary (Last 24 hours) at 10/07/2019 1540 Last data filed at 10/07/2019 0351 Gross per 24 hour  Intake 2937.42 ml  Output 1000 ml  Net 1937.42 ml   Filed Weights   10/05/19 0800  Weight: 86.5 kg    Examination:  Constitutional: NAD Eyes: no scleral icterus ENMT: Mucous membranes are moist.  Neck: normal, supple Respiratory: clear to auscultation bilaterally, no wheezing, no crackles. Normal respiratory effort. Cardiovascular: Regular rate and rhythm, no murmurs / rubs / gallops. No LE edema.  Abdomen: non distended, no tenderness.  Musculoskeletal: no clubbing / cyanosis.  Skin: Left surgical site noted with dressing in place with old dried blood. Neurologic: Nonfocal   Data Reviewed: I have independently  reviewed following labs and imaging studies   CBC: Recent Labs  Lab 10/04/19 1541 10/05/19 0606 10/06/19 0437 10/07/19 0758  WBC 8.3 11.1* 9.4 8.4   NEUTROABS 6.3  --   --   --   HGB 15.6 13.2 12.3* 10.1*  HCT 48.4 40.6 38.2* 31.3*  MCV 89.3 88.5 89.3 89.2  PLT 180 167 138* [id number]*   Basic Metabolic Panel: Recent Labs  Lab 10/04/19 1541 10/05/19 0606 10/06/19 0437 10/07/19 0758  NA 136 139 137 136  K 3.8 4.0 4.0 3.6  CL 104 106 102 103  CO2 20* 24 27 26   GLUCOSE 156* 127* 161* 113*  BUN 16 17 12 11   CREATININE 1.23 1.14 1.16 1.18  CALCIUM 9.1 9.0 8.7* 8.2*  MG  --  2.0  --   --    Liver Function Tests: Recent Labs  Lab 10/04/19 1541  AST 22  ALT 20  ALKPHOS 57  BILITOT 1.4*  PROT 6.9  ALBUMIN 4.1   Coagulation Profile: No results for input(s): INR, PROTIME in the last 168 hours. HbA1C: No results for input(s): HGBA1C in the last 72 hours. CBG: No results for input(s): GLUCAP in the last 168 hours.  Recent Results (from the past 240 hour(s))  Respiratory Panel by RT PCR (Flu A&B, Covid) - Nasopharyngeal Swab     Status: None   Collection Time: 10/04/19  3:41 PM   Specimen: Nasopharyngeal Swab  Result Value Ref Range Status   SARS Coronavirus 2 by RT PCR NEGATIVE NEGATIVE Final    Comment: (NOTE) SARS-CoV-2 target nucleic acids are NOT DETECTED. The SARS-CoV-2 RNA is generally detectable in upper respiratoy specimens during the acute phase of infection. The lowest concentration of SARS-CoV-2 viral copies this assay can detect is 131 copies/mL. A negative result does not preclude SARS-Cov-2 infection and should not be used as the sole basis for treatment or other patient management decisions. A negative result may occur with  improper specimen collection/handling, submission of specimen other than nasopharyngeal swab, presence of viral mutation(s) within the areas targeted by this assay, and inadequate number of viral copies (<131 copies/mL). A negative result must be combined with clinical observations, patient history, and epidemiological information. The expected result is Negative. Fact Sheet for  Patients:  PinkCheek.be Fact Sheet for Healthcare Providers:  GravelBags.it This test is not yet ap proved or cleared by the [place] FDA and  has been authorized for detection and/or diagnosis of SARS-CoV-2 by FDA under an Emergency Use Authorization (EUA). This EUA will remain  in effect (meaning this test can be used) for the duration of the COVID-19 declaration under Section 564(b)(1) of the Act, 21 U.S.C. section 360bbb-3(b)(1), unless the authorization is terminated or revoked sooner.    Influenza A by PCR NEGATIVE NEGATIVE Final   Influenza B by PCR NEGATIVE NEGATIVE Final    Comment: (NOTE) The Xpert Xpress SARS-CoV-2/FLU/RSV assay is intended as an aid in  the diagnosis of influenza from Nasopharyngeal swab specimens and  should not be used as a sole basis for treatment. Nasal washings and  aspirates are unacceptable for Xpert Xpress SARS-CoV-2/FLU/RSV  testing. Fact Sheet for Patients: PinkCheek.be Fact Sheet for Healthcare Providers: GravelBags.it This test is not yet approved or cleared by the [place] FDA and  has been authorized for detection and/or diagnosis of SARS-CoV-2 by  FDA under an Emergency Use Authorization (EUA). This EUA will remain  in effect (meaning this test can be used) for the duration of the  Covid-19 declaration under Section 564(b)(1) of the Act, 21  U.S.C. section 360bbb-3(b)(1), unless the authorization is  terminated or revoked. Performed at [facility], [place] [address], [place], [place] [address]   MRSA PCR Screening     Status: None   Collection Time: 10/04/19  9:50 PM   Specimen: Nasal Mucosa; Nasopharyngeal  Result Value Ref Range Status   MRSA by PCR NEGATIVE NEGATIVE Final    Comment:        The GeneXpert MRSA Assay (FDA approved for NASAL specimens only), is one component of a comprehensive MRSA  colonization surveillance program. It is not intended to diagnose MRSA infection nor to guide or monitor treatment for MRSA infections. Performed at [facility], [place] [address], [place], [place] [address]      Radiology Studies: No results found.   Time spent: 34 minutes spent on chart review, discussion with nursing staff, consultants, updating family and interview/physical exam; more than 50% of that time was spent in counseling and/or coordination of care.  Shenique Childers [place] DO [facility]  Between 7 am - 7 pm I am available, please contact me via Amion or Securechat  Between 7 pm - 7 am I am not available, please contact night coverage MD/APP via [employer]

## 2019-10-07 NOTE — Progress Notes (Signed)
Subjective: Doing well.  Pain controlled.    Objective: Vital signs in last 24 hours: Temp:  [97.6 F (36.4 C)-98.7 F (37.1 C)] 98.2 F (36.8 C) (01/19 0804) Pulse Rate:  [92-102] 96 (01/19 0804) Resp:  [10-18] 16 (01/19 0804) BP: (105-163)/(63-79) 127/68 (01/19 0804) SpO2:  [89 %-100 %] 97 % (01/19 0804) FiO2 (%):  [21 %] 21 % (01/18 1524)  Intake/Output from previous day: 01/18 0701 - 01/19 0700 In: 4187.4 [P.O.:480; I.V.:3457.4; IV Piggyback:250] Out: [id number] [Urine:3475; Blood:200] Intake/Output this shift: No intake/output data recorded.  Recent Labs    10/04/19 1541 10/05/19 0606 10/06/19 0437 10/07/19 0758  HGB 15.6 13.2 12.3* 10.1*   Recent Labs    10/06/19 0437 10/07/19 0758  WBC 9.4 8.4  RBC 4.28 3.51*  HCT 38.2* 31.3*  PLT 138* 136*   Recent Labs    10/06/19 0437 10/07/19 0758  NA 137 136  K 4.0 3.6  CL 102 103  CO2 27 26  BUN 12 11  CREATININE 1.16 1.18  GLUCOSE 161* 113*  CALCIUM 8.7* 8.2*   No results for input(s): LABPT, INR in the last 72 hours.  Exam: Very pleasant male. Alert and oriented, NAD.  Some bleeding through dressing.  bilat calves nontender, NVI.     Assessment/Plan: Dressing change. Patient lives alone. Hospitalist putting in a consult for inpt rehab.  D/c lovenox and will start ASA 325mg  daily for DVT prophylaxis. Start PT 50% PWB lett LE.      Benjiman Core 10/07/2019, 9:30 AM

## 2019-10-07 NOTE — Plan of Care (Signed)
  Problem: Education: Goal: Knowledge of General Education information will improve Description: Including pain rating scale, medication(s)/side effects and non-pharmacologic comfort measures Outcome: Progressing   Problem: Activity: Goal: Risk for activity intolerance will decrease Outcome: Progressing   Problem: Nutrition: Goal: Adequate nutrition will be maintained Outcome: Progressing   

## 2019-10-07 NOTE — Progress Notes (Signed)
Pt's dressing was reinforced with 2  ABD pads. Pt has moderate bleeding from the incision site. Ice applied. Lovenox is hold tonight per [place].

## 2019-10-07 NOTE — Progress Notes (Signed)
Inpatient Rehab Admissions:  Inpatient Rehab Consult received.  Pt does not have the medical necessity to warrant a CIR admission at this time.  Will need to seek f/u in a lower level of care.  Will sign off.   Signed: Shann Medal, PT, DPT Admissions Coordinator [phone] 10/07/19  11:39 AM

## 2019-10-08 DIAGNOSIS — W19XXXA Unspecified fall, initial encounter: Secondary | ICD-10-CM

## 2019-10-08 DIAGNOSIS — K5900 Constipation, unspecified: Secondary | ICD-10-CM

## 2019-10-08 DIAGNOSIS — Y92009 Unspecified place in unspecified non-institutional (private) residence as the place of occurrence of the external cause: Secondary | ICD-10-CM

## 2019-10-08 LAB — BASIC METABOLIC PANEL
Anion gap: 7 (ref 5–15)
BUN: 14 mg/dL (ref 8–23)
CO2: 27 mmol/L (ref 22–32)
Calcium: 8.4 mg/dL — ABNORMAL LOW (ref 8.9–10.3)
Chloride: 101 mmol/L (ref 98–111)
Creatinine, Ser: 1.22 mg/dL (ref 0.61–1.24)
GFR calc Af Amer: >60 mL/min (ref >60)
GFR calc non Af Amer: 56 mL/min — ABNORMAL LOW (ref >60)
Glucose, Bld: 113 mg/dL — ABNORMAL HIGH (ref 70–99)
Potassium: 4 mmol/L (ref 3.5–5.1)
Sodium: 135 mmol/L (ref 135–145)

## 2019-10-08 LAB — CBC
HCT: 31 % — ABNORMAL LOW (ref 39.0–52.0)
Hemoglobin: 9.9 g/dL — ABNORMAL LOW (ref 13.0–17.0)
MCH: 28.6 pg (ref 26.0–34.0)
MCHC: 31.9 g/dL (ref 30.0–36.0)
MCV: 89.6 fL (ref 80.0–100.0)
Platelets: 165 10*3/uL (ref 150–400)
RBC: 3.46 MIL/uL — ABNORMAL LOW (ref 4.22–5.81)
RDW: 14 % (ref 11.5–15.5)
WBC: 9.3 10*3/uL (ref 4.0–10.5)
nRBC: 0 % (ref 0.0–0.2)

## 2019-10-08 LAB — SARS CORONAVIRUS 2 (TAT 6-24 HRS): SARS Coronavirus 2: NEGATIVE

## 2019-10-08 MED ORDER — OXYCODONE-ACETAMINOPHEN 5-325 MG PO TABS: 1.0000 | ORAL_TABLET | Freq: Four times a day (QID) | ORAL | 0 refills | Status: DC | PRN

## 2019-10-08 MED ORDER — POLYETHYLENE GLYCOL 3350 17 G PO PACK
17.0000 g | PACK | Freq: Every day | ORAL | Status: DC
  Administered 2019-10-08 – 2019-10-09 (×2): 17 g via ORAL
  Filled 2019-10-08 (×4): qty 1

## 2019-10-08 MED ORDER — SENNOSIDES-DOCUSATE SODIUM 8.6-50 MG PO TABS
1.0000 | ORAL_TABLET | Freq: Two times a day (BID) | ORAL | Status: DC
  Administered 2019-10-08 – 2019-10-09 (×3): 1 via ORAL
  Filled 2019-10-08 (×6): qty 1

## 2019-10-08 MED ORDER — ASPIRIN 325 MG PO TABS: 325.0000 mg | ORAL_TABLET | Freq: Every day | ORAL | 0 refills | Status: DC

## 2019-10-08 NOTE — Progress Notes (Addendum)
PROGRESS NOTE  [name] [name] [id number] DOB: [birth year] DOA: 10/04/2019 PCP: [name], MD  Brief Narrative / Interim history: ZABIAN TRUCKS 79 year old male with PMHx NHL in remission, HTN, previous hip repair surgeries, admitted 1/16 with left hip fracture. This was following ground-level fall, no syncope but tripped and fell on the left side. ( he reports "reports was using foot warmer and got up to move , tripped and fell on the left side . ) He reported immediate severe pain in the left hip and inability to ambulate. X-ray on admission showed comminuted intertrochanteric fracture of the distal femur with pronounced fragmentation and partial retraction of the lesser trochanter. Orthopedic surgery was consulted  Underwent ORIF with IMN by orthopedics, currently awaiting SNF placement  HPI/Recap of past 24 hours:  Sitting in bed, reports pain is better today C/o being constipated  Assessment/Plan: Active Problems:   Lymphoma ([place])   Closed left hip fracture ([place])   Essential hypertension  Left hip fracture Patient presenting from home following mechanical fall.  X-ray left hip/pelvis with comminuted intratrochanteric fracture left femur with pronounced fragmentation and partial retraction of the lesser trochanter.  Underwent ORIF with IM nail by Dr. Lorin Mercy on 10/06/2019. --Continue PT/OT efforts; Partial weightbearing 50% left lower extremity --Aspirin 325 mg p.o. daily for DVT prophylaxis per orthopedics --Follow-up with Dr. Lorin Mercy 2 weeks postop   Essential hypertension --Continue home HCTZ and lisinopril  Non-Hodgkin's lymphoma Report received hyperthermia treatment in [place] 8 years ago Report no prior history of traditional treatment Report he is in remission  Constipation: start stool softener  DVT Prophylaxis:asa 325 per ortho recommendation  Code Status: full  Family Communication: patient   Disposition Plan: SNF, repeat covid screening order placed,  discussed with Education officer, museum and RN   Consultants:  ortho  Procedures:  Removal of retrograde locked left femoral nail.  [place].  Placement of left [employer] affixes trochanteric nail 11 mm short with 120 lag screw and 40 mm interlock.  Antibiotics:  Perioperative Ancef on January 18   Objective: BP 132/64 (BP Location: Left Arm)   Pulse 92   Temp 97.9 F (36.6 C) (Oral)   Resp 16   Ht 5\' 9"  (1.753 m)   Wt 86.5 kg   SpO2 96%   BMI 28.16 kg/m   Intake/Output Summary (Last 24 hours) at 10/08/2019 1237 Last data filed at 10/08/2019 1042 Gross per 24 hour  Intake 880 ml  Output 1150 ml  Net -270 ml   Filed Weights   10/05/19 0800  Weight: 86.5 kg    Exam: Patient is examined daily including today on 10/08/2019, exams remain the same as of yesterday except that has changed    General:  NAD, pleasant  Cardiovascular: RRR  Respiratory: CTABL  Abdomen: Soft/ND/NT, positive BS  Musculoskeletal: No Edema, left hip post op changes, neurovascular intact distally  Neuro: alert, oriented   Data Reviewed: Basic Metabolic Panel: Recent Labs  Lab 10/04/19 1541 10/05/19 0606 10/06/19 0437 10/07/19 0758 10/08/19 0240  NA 136 139 137 136 135  K 3.8 4.0 4.0 3.6 4.0  CL 104 106 102 103 101  CO2 20* 24 27 26 27   GLUCOSE 156* 127* 161* 113* 113*  BUN 16 17 12 11 14   CREATININE 1.23 1.14 1.16 1.18 1.22  CALCIUM 9.1 9.0 8.7* 8.2* 8.4*  MG  --  2.0  --   --   --    Liver Function Tests: Recent Labs  Lab  10/04/19 1541  AST 22  ALT 20  ALKPHOS 57  BILITOT 1.4*  PROT 6.9  ALBUMIN 4.1   No results for input(s): LIPASE, AMYLASE in the last 168 hours. No results for input(s): AMMONIA in the last 168 hours. CBC: Recent Labs  Lab 10/04/19 1541 10/05/19 0606 10/06/19 0437 10/07/19 0758 10/08/19 0240  WBC 8.3 11.1* 9.4 8.4 9.3  NEUTROABS 6.3  --   --   --   --   HGB 15.6 13.2 12.3* 10.1* 9.9*  HCT 48.4 40.6 38.2* 31.3* 31.0*  MCV 89.3 88.5  89.3 89.2 89.6  PLT 180 167 138* 136* 165   Cardiac Enzymes:   No results for input(s): CKTOTAL, CKMB, CKMBINDEX, TROPONINI in the last 168 hours. BNP (last 3 results) No results for input(s): BNP in the last 8760 hours.  ProBNP (last 3 results) No results for input(s): PROBNP in the last 8760 hours.  CBG: No results for input(s): GLUCAP in the last 168 hours.  Recent Results (from the past 240 hour(s))  Respiratory Panel by RT PCR (Flu A&B, Covid) - Nasopharyngeal Swab     Status: None   Collection Time: 10/04/19  3:41 PM   Specimen: Nasopharyngeal Swab  Result Value Ref Range Status   SARS Coronavirus 2 by RT PCR NEGATIVE NEGATIVE Final    Comment: (NOTE) SARS-CoV-2 target nucleic acids are NOT DETECTED. The SARS-CoV-2 RNA is generally detectable in upper respiratoy specimens during the acute phase of infection. The lowest concentration of SARS-CoV-2 viral copies this assay can detect is 131 copies/mL. A negative result does not preclude SARS-Cov-2 infection and should not be used as the sole basis for treatment or other patient management decisions. A negative result may occur with  improper specimen collection/handling, submission of specimen other than nasopharyngeal swab, presence of viral mutation(s) within the areas targeted by this assay, and inadequate number of viral copies (<131 copies/mL). A negative result must be combined with clinical observations, patient history, and epidemiological information. The expected result is Negative. Fact Sheet for Patients:  PinkCheek.be Fact Sheet for Healthcare Providers:  GravelBags.it This test is not yet ap proved or cleared by the [place] FDA and  has been authorized for detection and/or diagnosis of SARS-CoV-2 by FDA under an Emergency Use Authorization (EUA). This EUA will remain  in effect (meaning this test can be used) for the duration of the COVID-19  declaration under Section 564(b)(1) of the Act, 21 U.S.C. section 360bbb-3(b)(1), unless the authorization is terminated or revoked sooner.    Influenza A by PCR NEGATIVE NEGATIVE Final   Influenza B by PCR NEGATIVE NEGATIVE Final    Comment: (NOTE) The Xpert Xpress SARS-CoV-2/FLU/RSV assay is intended as an aid in  the diagnosis of influenza from Nasopharyngeal swab specimens and  should not be used as a sole basis for treatment. Nasal washings and  aspirates are unacceptable for Xpert Xpress SARS-CoV-2/FLU/RSV  testing. Fact Sheet for Patients: PinkCheek.be Fact Sheet for Healthcare Providers: GravelBags.it This test is not yet approved or cleared by the [place] FDA and  has been authorized for detection and/or diagnosis of SARS-CoV-2 by  FDA under an Emergency Use Authorization (EUA). This EUA will remain  in effect (meaning this test can be used) for the duration of the  Covid-19 declaration under Section 564(b)(1) of the Act, 21  U.S.C. section 360bbb-3(b)(1), unless the authorization is  terminated or revoked. Performed at [facility], [place] [address] [place], [place] [address]   MRSA  PCR Screening     Status: None   Collection Time: 10/04/19  9:50 PM   Specimen: Nasal Mucosa; Nasopharyngeal  Result Value Ref Range Status   MRSA by PCR NEGATIVE NEGATIVE Final    Comment:        The GeneXpert MRSA Assay (FDA approved for NASAL specimens only), is one component of a comprehensive MRSA colonization surveillance program. It is not intended to diagnose MRSA infection nor to guide or monitor treatment for MRSA infections. Performed at [facility], [place] [address] [place], [place] [address]      Studies: No results found.  Scheduled Meds: . aspirin  325 mg Oral Daily  . hydrochlorothiazide  25 mg Oral Daily   And  . lisinopril  40 mg Oral Daily  . senna-docusate  1 tablet Oral BID     Continuous Infusions: . lactated ringers       Time spent: 35mins I have personally reviewed and interpreted on  10/08/2019 daily labs, imagings as discussed above under date review session and assessment and plans.  I reviewed all nursing notes, pharmacy notes, consultant notes,  vitals, pertinent old records  I have discussed plan of care as described above with RN , patient  on 10/08/2019   Florencia Reasons MD, PhD, FACP  [facility]  Available via Epic secure chat 7am-7pm for nonurgent issues Please page for urgent issues, pager number available through [email or web address] .   10/08/2019, 12:37 PM  LOS: 4 days

## 2019-10-08 NOTE — TOC Initial Note (Signed)
Transition of Care [facility]) - Initial/Assessment Note    Patient Details  Name: [name] [name] MRN: [id number] Date of Birth: March 06, 1941  [facility]) CM/SW Contact:    Atilano Median, LCSW Phone Number: 10/08/2019, 4:02 PM  Clinical Narrative:                 Admitted with medical history significant of  NHL in remission, hypertension, previous hip repair surgeries, reports was using foot warmer and got up to move , tripped and fell on the left side .   PT recommending SNF. CSW spoke with patient and his daughter Lattie Haw to discuss dispo plans.   Both are agreeable to this plan. CSW given permission to fax out patient's information for review.   Plan is SNF pending bed offers and negative COVID test.   COVID will be collected today. TOC will continue to follow.   Expected Discharge Plan: Skilled Nursing Facility Barriers to Discharge: Continued Medical Work up   Patient Goals and CMS Choice Patient states their goals for this hospitalization and ongoing recovery are:: get back to how I was before CMS Medicare.gov Compare Post Acute Care list provided to:: Patient Represenative (must comment)(Lisa Noberto [place]) Choice offered to / list presented to : Adult Children  Expected Discharge Plan and Services Expected Discharge Plan: [place] In-house Referral: Clinical Social Work   Post Acute Care Choice: [place] Living arrangements for the past 2 months: [place]                                      Prior Living Arrangements/Services Living arrangements for the past 2 months: Single Family Home Lives with:: Self Patient language and need for interpreter reviewed:: Yes Do you feel safe going back to the place where you live?: No   need supervision for mobility  Need for Family Participation in Patient Care: Yes (Comment) Care giver support system in place?: Yes (comment)      Activities of Daily Living Home Assistive  Devices/Equipment: None ADL Screening (condition at time of admission) Patient's cognitive ability adequate to safely complete daily activities?: Yes Is the patient deaf or have difficulty hearing?: No Does the patient have difficulty seeing, even when wearing glasses/contacts?: No Does the patient have difficulty concentrating, remembering, or making decisions?: No Patient able to express need for assistance with ADLs?: Yes Does the patient have difficulty dressing or bathing?: Yes Independently performs ADLs?: No Does the patient have difficulty walking or climbing stairs?: Yes Weakness of Legs: Both Weakness of Arms/Hands: None  Permission Sought/Granted Permission sought to share information with : Family Supports Permission granted to share information with : Yes, Verbal Permission Granted  Share Information with NAME: Vonzell Schlatter     Permission granted to share info w Relationship: daughter  Permission granted to share info w Contact Information: [phone]  Emotional Assessment       Orientation: : Oriented to Self, Oriented to Place, Oriented to  Time, Oriented to Situation      Admission diagnosis:  Hip fx ([place]) [S72.009A] Closed left hip fracture ([place]) [S72.002A] Closed fracture of left hip, initial encounter ([place]) [S72.002A] Fall, initial encounter [W19.XXXA] Patient Active Problem List   Diagnosis Date Noted  . Closed left hip fracture ([place]) 10/04/2019  . Essential hypertension 10/04/2019  . Fall   . Port-A-Cath in place 02/04/2019  . Thrombocytopenia ([place]) 01/22/2018  .  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 History of arthroscopy of left shoulder 07/12/2016  . Port catheter in place 02/22/2016  . 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 Vitamin D deficiency 11/21/2012  . Lymphoma ([place]) 06/13/2012   PCP:  [name], MD Pharmacy:   [id number]  - [place], [address] [place] [address] Phone: [phone] Fax: [phone]     Social Determinants of Health (SDOH) Interventions    Readmission Risk Interventions No flowsheet data found.

## 2019-10-08 NOTE — Discharge Instructions (Signed)
ORTHO DISCHARGE INSTRUCTIONS AFTER ORIF HIP  -  RN to perform daily dressing changes and wound checks at facility.     -ok to shower but no tub soaking  -do not apply any creams or ointments to incision  -50% partial weightbearing per Dr Lorin Mercy until further notice  -physical therapy per protocol  -no aggressive activity  -no squatting, lifting, driving, climbing until further notice

## 2019-10-08 NOTE — NC FL2 (Signed)
[place] LEVEL OF CARE SCREENING TOOL     IDENTIFICATION  Patient Name: [name] [name] Birthdate: 16-Aug-1941 Sex: male Admission Date (Current Location): 10/04/2019  [facility] and [place] Number:  Herbalist and Address:  The [place]. [facility] [place] [address], [place], [place] [address]      Provider Number: [id number]  Attending Physician Name and Address:  Florencia Reasons, MD  Relative Name and Phone Number:  Vonzell Schlatter [phone]    Current Level of Care: Hospital Recommended Level of Care: [place] Prior Approval Number:    Date Approved/Denied:   PASRR Number: [id number]  Discharge Plan: SNF    Current Diagnoses: Patient Active Problem List   Diagnosis Date Noted  . Closed left hip fracture ([place]) 10/04/2019  . Essential hypertension 10/04/2019  . Fall   . Port-A-Cath in place 02/04/2019  . Thrombocytopenia ([place]) 01/22/2018  . 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 History of arthroscopy of left shoulder 07/12/2016  . Port catheter in place 02/22/2016  . 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 Vitamin D deficiency 11/21/2012  . Lymphoma ([place]) 06/13/2012    Orientation RESPIRATION BLADDER Height & Weight     Self, Time, Situation, Place  Normal Continent Weight: 190 lb 11.2 oz (86.5 kg) Height:  5\' 9"  (175.3 cm)  BEHAVIORAL SYMPTOMS/MOOD NEUROLOGICAL BOWEL NUTRITION STATUS      Continent Diet(see discharge summary)  AMBULATORY STATUS COMMUNICATION OF NEEDS Skin   Extensive Assist   Normal, Surgical wounds                       Personal Care Assistance Level of Assistance  Bathing, Feeding, Dressing Bathing Assistance: Maximum assistance Feeding assistance: Maximum assistance Dressing Assistance: Maximum assistance     Functional Limitations Info  Sight, Hearing, Speech Sight Info:  Adequate Hearing Info: Adequate Speech Info: Adequate    SPECIAL CARE FACTORS FREQUENCY  PT (By licensed PT), OT (By licensed OT)     PT Frequency: 5 times a week OT Frequency: 5 times a week            Contractures      Additional Factors Info  Code Status Code Status Info: Full             Current Medications (10/08/2019):  This is the current hospital active medication list Current Facility-Administered Medications  Medication Dose Route Frequency Provider Last Rate Last Admin  . acetaminophen (TYLENOL) tablet 650 mg  650 mg Oral Q4H PRN Hosie Poisson, MD   650 mg at 10/07/19 0528  . aspirin tablet 325 mg  325 mg Oral Daily Lanae Crumbly, PA-C   325 mg at 10/08/19 [id number]  . hydrochlorothiazide (HYDRODIURIL) tablet 25 mg  25 mg Oral Daily Bonnell Public Tublu, MD   25 mg at 10/08/19 [id number]   And  . lisinopril (ZESTRIL) tablet 40 mg  40 mg Oral Daily Bonnell Public Tublu, MD   40 mg at 10/08/19 [id number]  . lactated ringers infusion   Intravenous Continuous Audry Pili, MD      . menthol-cetylpyridinium (CEPACOL) lozenge 3 mg  1 lozenge Oral PRN Lanae Crumbly, PA-C       Or  . phenol (CHLORASEPTIC) mouth spray 1 spray  1 spray Mouth/Throat PRN Lanae Crumbly, PA-C      . metoCLOPramide (REGLAN) tablet 5-10  mg  5-10 mg Oral Q8H PRN Lanae Crumbly, PA-C       Or  . metoCLOPramide (REGLAN) injection 5-10 mg  5-10 mg Intravenous Q8H PRN Benjiman Core M, PA-C      . ondansetron [facility] tablet 4 mg  4 mg Oral Q6H PRN Lanae Crumbly, PA-C       Or  . ondansetron [facility]) injection 4 mg  4 mg Intravenous Q6H PRN Lanae Crumbly, PA-C      . oxyCODONE (Oxy IR/ROXICODONE) immediate release tablet 5 mg  5 mg Oral Q4H PRN Bodenheimer, Charles A, NP   5 mg at 10/08/19 1422  . polyethylene glycol (MIRALAX / GLYCOLAX) packet 17 g  17 g Oral Daily Florencia Reasons, MD   17 g at 10/08/19 1423  . senna-docusate (Senokot-S) tablet 1 tablet  1 tablet Oral BID Florencia Reasons, MD   1 tablet at  10/08/19 1422   Facility-Administered Medications Ordered in Other Encounters  Medication Dose Route Frequency Provider Last Rate Last Admin  . heparin lock flush 100 unit/mL  500 Units Intravenous Once PRN Curt Bears, MD      . sodium chloride 0.9 % injection 10 mL  10 mL Intravenous PRN Curt Bears, MD   10 mL at 12/29/14 1026  . sodium chloride 0.9 % injection 10 mL  10 mL Intravenous PRN Curt Bears, MD         Discharge Medications: Please see discharge summary for a list of discharge medications.  Relevant Imaging Results:  Relevant Lab Results:   Additional Information ss # [id number] LCSW

## 2019-10-08 NOTE — Progress Notes (Addendum)
Physical Therapy Treatment Patient Details Name: [name] [name] MRN: [id number] DOB: Mar 03, 1941 Today's Date: 10/08/2019    History of Present Illness Pt is a 79 y/o male with PMH of lymphoma previous femur fractures treated with intramedullary rods and mechanical fall with left intertrochanteric hip fracture.  Previous femur fractures were fixed with retrograde rods and his left hip fracture is above the left intramedullary femoral nail that ends in the subtroches region.  Patient has history of hypertension.Pt now s/p IM nail 10/06/19    PT Comments    Pt making slow progress with mobility. He remains limited secondary to L LE pain and weakness. Pt's daughter present throughout session as well. He will require post-acute rehab at a SNF prior to returning home (CIR denied, "pt does not have the medical necessity to warrant CIR admission").  Pt's daughter reporting that pt's house is unfit for him to return home and that he would not be able to come stay with her as she lives in a third level apartment with no elevator.   Pt would continue to benefit from skilled physical therapy services at this time while admitted and after d/c to address the below listed limitations in order to improve overall safety and independence with functional mobility.   Follow Up Recommendations  SNF     Equipment Recommendations  Rolling walker with 5" wheels    Recommendations for Other Services       Precautions / Restrictions Precautions Precautions: Fall Restrictions Weight Bearing Restrictions: Yes LLE Weight Bearing: Partial weight bearing LLE Partial Weight Bearing Percentage or Pounds: 50%    Mobility  Bed Mobility Overal bed mobility: Needs Assistance Bed Mobility: Supine to Sit     Supine to sit: HOB elevated;Min assist     General bed mobility comments: min A for L LE movement off of bed to achieve upright sitting towards pt's R side  Transfers Overall transfer level: Needs  assistance Equipment used: Rolling walker (2 wheeled) Transfers: Sit to/from [facility] Sit to Stand: Mod assist;From elevated surface Stand pivot transfers: Min assist       General transfer comment: good technique and hand placement utilized; assistance needed to power into standing from elevated bed position, assistance to pivot to [facility] towards pt's R side  Ambulation/Gait             General Gait Details: deferred as pt requesting to sit on BSC to attempt to have a BM   Stairs             Wheelchair Mobility    Modified Rankin (Stroke Patients Only)       Balance Overall balance assessment: Needs assistance Sitting-balance support: Feet supported Sitting balance-Leahy Scale: Fair     Standing balance support: Bilateral upper extremity supported Standing balance-Leahy Scale: Poor Standing balance comment: pt reliant on UE support from RW                            Cognition Arousal/Alertness: Awake/alert Behavior During Therapy: WFL for tasks assessed/performed Overall Cognitive Status: Within Functional Limits for tasks assessed                                        Exercises      General Comments        Pertinent Vitals/Pain Pain Assessment: Faces Faces Pain Scale:  Hurts even more Pain Location: LLE Pain Descriptors / Indicators: Aching;Sore;Grimacing Pain Intervention(s): Monitored during session;Repositioned    Home Living                      Prior Function            PT Goals (current goals can now be found in the care plan section) Acute Rehab PT Goals PT Goal Formulation: With patient Time For Goal Achievement: 10/21/19 Potential to Achieve Goals: Good Progress towards PT goals: Progressing toward goals    Frequency    Min 3X/week      PT Plan Current plan remains appropriate    Co-evaluation              AM-PAC PT "6 Clicks" Mobility   Outcome Measure   Help needed turning from your back to your side while in a flat bed without using bedrails?: A Little Help needed moving from lying on your back to sitting on the side of a flat bed without using bedrails?: A Little Help needed moving to and from a bed to a chair (including a wheelchair)?: A Little Help needed standing up from a chair using your arms (e.g., wheelchair or bedside chair)?: A Lot Help needed to walk in hospital room?: A Lot Help needed climbing 3-5 steps with a railing? : Total 6 Click Score: 14    End of Session Equipment Utilized During Treatment: Gait belt Activity Tolerance: Patient tolerated treatment well Patient left: with call bell/phone within reach;with family/visitor present;Other (comment)(seated on BSC to attempt to have a BM) Nurse Communication: Mobility status PT Visit Diagnosis: Difficulty in walking, not elsewhere classified (R26.2);Other abnormalities of gait and mobility (R26.89);Pain Pain - Right/Left: Left Pain - part of body: Hip     Time: [id number] PT Time Calculation (min) (ACUTE ONLY): 26 min  Charges:  $Therapeutic Activity: 23-37 mins                     Anastasio Champion, DPT  Acute Rehabilitation Services Pager [phone] Office [place] 10/08/2019, 4:59 PM

## 2019-10-08 NOTE — Progress Notes (Signed)
Subjective: Left hip doing ok.  Pain controlled.     Objective: Vital signs in last 24 hours: Temp:  [97.6 F (36.4 C)-98.3 F (36.8 C)] 97.9 F (36.6 C) (01/20 0740) Pulse Rate:  [90-105] 92 (01/20 0740) Resp:  [15-20] 16 (01/20 0740) BP: (116-132)/(54-73) 132/64 (01/20 0740) SpO2:  [93 %-100 %] 96 % (01/20 0740)  Intake/Output from previous day: 01/19 0701 - 01/20 0700 In: 520 [P.O.:520] Out: 950 [Urine:950] Intake/Output this shift: Total I/O In: 600 [P.O.:600] Out: 200 [Urine:200]  Recent Labs    10/06/19 0437 10/07/19 0758 10/08/19 0240  HGB 12.3* 10.1* 9.9*   Recent Labs    10/07/19 0758 10/08/19 0240  WBC 8.4 9.3  RBC 3.51* 3.46*  HCT 31.3* 31.0*  PLT 136* 165   Recent Labs    10/07/19 0758 10/08/19 0240  NA 136 135  K 3.6 4.0  CL 103 101  CO2 26 27  BUN 11 14  CREATININE 1.18 1.22  GLUCOSE 113* 113*  CALCIUM 8.2* 8.4*   No results for input(s): LABPT, INR in the last 72 hours.  Exam: Very pleasant male, alert and oriented, NAD.  Hip and knee wounds looks good.  No signs of infection.  Calf nontender.     Assessment/Plan: Waiting for social work to see patient for d/c planning.  CIR will not take patient.  Stable from ortho standpoint.      Benjiman Core 10/08/2019, 1:17 PM

## 2019-10-08 NOTE — Plan of Care (Signed)
  Problem: Education: Goal: Knowledge of General Education information will improve Description: Including pain rating scale, medication(s)/side effects and non-pharmacologic comfort measures Outcome: Progressing   Problem: Clinical Measurements: Goal: Will remain free from infection Outcome: Progressing   Problem: Coping: Goal: Level of anxiety will decrease Outcome: Progressing   Problem: Elimination: Goal: Will not experience complications related to bowel motility Outcome: Progressing   Problem: Pain Managment: Goal: General experience of comfort will improve Outcome: Progressing   Problem: Safety: Goal: Ability to remain free from injury will improve Outcome: Progressing   Problem: Skin Integrity: Goal: Risk for impaired skin integrity will decrease Outcome: Progressing

## 2019-10-09 LAB — CBC WITH DIFFERENTIAL/PLATELET
Abs Immature Granulocytes: 0.02 10*3/uL (ref 0.00–0.07)
Basophils Absolute: 0 10*3/uL (ref 0.0–0.1)
Basophils Relative: 1 %
Eosinophils Absolute: 0.3 10*3/uL (ref 0.0–0.5)
Eosinophils Relative: 4 %
HCT: 30.5 % — ABNORMAL LOW (ref 39.0–52.0)
Hemoglobin: 10 g/dL — ABNORMAL LOW (ref 13.0–17.0)
Immature Granulocytes: 0 %
Lymphocytes Relative: 21 %
Lymphs Abs: 1.6 10*3/uL (ref 0.7–4.0)
MCH: 28.6 pg (ref 26.0–34.0)
MCHC: 32.8 g/dL (ref 30.0–36.0)
MCV: 87.1 fL (ref 80.0–100.0)
Monocytes Absolute: 0.7 10*3/uL (ref 0.1–1.0)
Monocytes Relative: 9 %
Neutro Abs: 4.8 10*3/uL (ref 1.7–7.7)
Neutrophils Relative %: 65 %
Platelets: 177 10*3/uL (ref 150–400)
RBC: 3.5 MIL/uL — ABNORMAL LOW (ref 4.22–5.81)
RDW: 14 % (ref 11.5–15.5)
WBC: 7.3 10*3/uL (ref 4.0–10.5)
nRBC: 0 % (ref 0.0–0.2)

## 2019-10-09 LAB — COMPREHENSIVE METABOLIC PANEL
ALT: 21 U/L (ref 0–44)
AST: 27 U/L (ref 15–41)
Albumin: 3 g/dL — ABNORMAL LOW (ref 3.5–5.0)
Alkaline Phosphatase: 56 U/L (ref 38–126)
Anion gap: 11 (ref 5–15)
BUN: 20 mg/dL (ref 8–23)
CO2: 25 mmol/L (ref 22–32)
Calcium: 8.7 mg/dL — ABNORMAL LOW (ref 8.9–10.3)
Chloride: 102 mmol/L (ref 98–111)
Creatinine, Ser: 1.14 mg/dL (ref 0.61–1.24)
GFR calc Af Amer: >60 mL/min (ref >60)
GFR calc non Af Amer: >60 mL/min (ref >60)
Glucose, Bld: 106 mg/dL — ABNORMAL HIGH (ref 70–99)
Potassium: 3.5 mmol/L (ref 3.5–5.1)
Sodium: 138 mmol/L (ref 135–145)
Total Bilirubin: 1.3 mg/dL — ABNORMAL HIGH (ref 0.3–1.2)
Total Protein: 5.8 g/dL — ABNORMAL LOW (ref 6.5–8.1)

## 2019-10-09 MED ORDER — OXYCODONE-ACETAMINOPHEN 5-325 MG PO TABS: 1.0000 | ORAL_TABLET | Freq: Four times a day (QID) | ORAL | 0 refills | Status: DC | PRN

## 2019-10-09 MED ORDER — POLYETHYLENE GLYCOL 3350 17 G PO PACK: 17.0000 g | PACK | Freq: Every day | ORAL | 0 refills | Status: DC

## 2019-10-09 MED ORDER — SENNOSIDES-DOCUSATE SODIUM 8.6-50 MG PO TABS: 1.0000 | ORAL_TABLET | Freq: Every day | ORAL | 0 refills | Status: DC

## 2019-10-09 MED ORDER — ASPIRIN 325 MG PO TABS: 325.0000 mg | ORAL_TABLET | Freq: Every day | ORAL | 0 refills | Status: DC

## 2019-10-09 MED ORDER — BISACODYL 10 MG RE SUPP
10.0000 mg | Freq: Once | RECTAL | Status: AC
  Administered 2019-10-09: 10 mg via RECTAL
  Filled 2019-10-09: qty 1

## 2019-10-09 NOTE — Progress Notes (Signed)
Physical Therapy Treatment Patient Details Name: [name] [name] MRN: [id number] DOB: 06/15/1941 Today's Date: 10/09/2019    History of Present Illness Pt is a 79 y/o male with PMH of lymphoma previous femur fractures treated with intramedullary rods and mechanical fall with left intertrochanteric hip fracture.  Previous femur fractures were fixed with retrograde rods and his left hip fracture is above the left intramedullary femoral nail that ends in the subtroches region.  Patient has history of hypertension.Pt now s/p IM nail 10/06/19    PT Comments    Pt seated on BSC attempting to finish a BM upon arrival. Pt's daughter outside of room throughout session. Pt having great difficulty with BM. He described in detail how he normally performs self digital stimulation to complete BM and requesting assistance with that. RN was notified and entered room, offering him a warm prune juice drink. Pt straining and performing valsalva maneuver during attempts at having BM and therapist explaining importance of not straining. However, he continued to do so. Pt then reporting dizziness and appeared very pale and diaphoretic, at which time therapist and RN assisted pt back to bed. Plan is for pt to d/c to SNF today or tomorrow.   Pt would continue to benefit from skilled physical therapy services at this time while admitted and after d/c to address the below listed limitations in order to improve overall safety and independence with functional mobility.   Follow Up Recommendations  SNF     Equipment Recommendations  Rolling walker with 5" wheels    Recommendations for Other Services       Precautions / Restrictions Precautions Precautions: Fall Restrictions Weight Bearing Restrictions: Yes LLE Weight Bearing: Partial weight bearing LLE Partial Weight Bearing Percentage or Pounds: 50%    Mobility  Bed Mobility Overal bed mobility: Needs Assistance Bed Mobility: Sit to Supine       Sit to  supine: Mod assist   General bed mobility comments: assistance needed to return bilateral LEs to bed  Transfers Overall transfer level: Needs assistance Equipment used: Rolling walker (2 wheeled) Transfers: Sit to/from [facility] Sit to Stand: Mod assist Stand pivot transfers: Mod assist       General transfer comment: good technique and hand placement utilized; cueing for safety as pt reporting increasing dizziness, "fuzzy feeling", pale and diaphoretic  Ambulation/Gait             General Gait Details: deferred secondary to above   Stairs             Wheelchair Mobility    Modified Rankin (Stroke Patients Only)       Balance Overall balance assessment: Needs assistance Sitting-balance support: Feet supported Sitting balance-Leahy Scale: Fair     Standing balance support: Bilateral upper extremity supported Standing balance-Leahy Scale: Poor Standing balance comment: pt reliant on UE support from RW                            Cognition Arousal/Alertness: Awake/alert Behavior During Therapy: WFL for tasks assessed/performed Overall Cognitive Status: Within Functional Limits for tasks assessed                                        Exercises      General Comments        Pertinent Vitals/Pain Pain Assessment: Faces Faces Pain Scale: Hurts  whole lot Pain Location: LLE Pain Descriptors / Indicators: Aching;Sore;Grimacing Pain Intervention(s): Monitored during session;Repositioned    Home Living                      Prior Function            PT Goals (current goals can now be found in the care plan section) Acute Rehab PT Goals PT Goal Formulation: With patient Time For Goal Achievement: 10/21/19 Potential to Achieve Goals: Good Progress towards PT goals: Progressing toward goals    Frequency    Min 3X/week      PT Plan Current plan remains appropriate    Co-evaluation               AM-PAC PT "6 Clicks" Mobility   Outcome Measure  Help needed turning from your back to your side while in a flat bed without using bedrails?: A Little Help needed moving from lying on your back to sitting on the side of a flat bed without using bedrails?: A Little Help needed moving to and from a bed to a chair (including a wheelchair)?: A Little Help needed standing up from a chair using your arms (e.g., wheelchair or bedside chair)?: A Lot Help needed to walk in hospital room?: A Lot Help needed climbing 3-5 steps with a railing? : Total 6 Click Score: 14    End of Session Equipment Utilized During Treatment: Gait belt Activity Tolerance: Patient limited by pain;Patient limited by fatigue Patient left: in bed;with call bell/phone within reach;with bed alarm set Nurse Communication: Mobility status;Other (comment)(pt requesting digital stimulation to assist with BM) PT Visit Diagnosis: Difficulty in walking, not elsewhere classified (R26.2);Other abnormalities of gait and mobility (R26.89);Pain Pain - Right/Left: Left Pain - part of body: Hip     Time: [id number] PT Time Calculation (min) (ACUTE ONLY): 45 min  Charges:  $Therapeutic Activity: 38-52 mins                     Anastasio Champion, DPT  Acute Rehabilitation Services Pager [phone] Office [place] 10/09/2019, 4:16 PM

## 2019-10-09 NOTE — Discharge Summary (Addendum)
Discharge Summary  RONDEL [name] [id number] DOB: 02-14-1941  PCP: [name], MD  Admit date: 10/04/2019 Discharge date: 10/11/2019   Planned discharge on January 21 cancelled due to staffing issue at skilled nursing facility, patient has been stable, no interval changes, he is medically stable to discharge to skilled nursing facility today on 1/23.  Time spent: 23mins, more than 50% time spent on coordination of care.  Recommendations for Outpatient Follow-up:  1. F/u with SNF MD for hospital discharge follow up, repeat cbc/bmp at follow up 2. F/u with orthopedics Dr Lorin Mercy in two weeks  Discharge Diagnoses:  Active Hospital Problems   Diagnosis Date Noted  . Closed left hip fracture ([place]) 10/04/2019  . Essential hypertension 10/04/2019  . Lymphoma ([place]) 06/13/2012    Resolved Hospital Problems  No resolved problems to display.    Discharge Condition: stable  Diet recommendation: heart healthy  Filed Weights   10/05/19 0800  Weight: 86.5 kg    History of present illness: (Per admitting MD Dr. Karleen Hampshire) Chief Complaint: left hip pain from a mechanical fall.   HPI: [name] [name] is a 79 y.o. adult with medical history significant of  NHL in remission, hypertension, previous hip repair surgeries, reports was using foot warmer and got up to move , tripped and fell on the left side . He reports severe pain in the left hip and unable to ambulate. He was brought o ED by his daughters. Pt in general denies any complaints other than pain in the left hip. He denies chest pain, sob, cough, fever, chills, nausea, vomiting , abdominal pain, diarrhea, dysuria, denies exposure to COVID 19, No sick contacts.   ED Course: on arrival to ED, he was found to be afebrile, normotensive. Labs show sodium of 136, bicarb of 20, normal cbc,. Negative respiratory panel.  COVID 19 Screening test is negative.  CXR  Shows Diffusely coarsened interstitial changes which could reflect  chronic and slightly worsening interstitial fibrotic features seen in more early stages on comparison CT.  X rays of the hip show Comminuted intertrochanteric fracture of the left femur with pronounced fragmentation and partial retraction of the lesser trochanter. Extensive adjacent soft tissue swelling. Remaining bones of the pelvis remain intact and congruent. Prior bilateral femoral intramedullary nail placement.  X rays of the knee No acute fracture or dislocation identified about the left knee. Small suprapatellar joint effusion.  He was referred to [facility] for admission for the evaluation and management of left femur fracture. Orthopedics consulted .    Hospital Course:  Active Problems:   Lymphoma ([place])   Closed left hip fracture [facility])   Essential hypertension   Left hip fracture Patient presenting from home following mechanical fall.  X-ray left hip/pelvis with comminuted intratrochanteric fracture left femur with pronounced fragmentation and partial retraction of the lesser trochanter.  Underwent ORIF with IM nail by Dr. Lorin Mercy on 10/06/2019. --Continue PT/OT ;Partial weightbearing 50% left lower extremity --Aspirin 325 mg p.o. daily for DVT prophylaxis per orthopedics --Follow-up with Dr. Lorin Mercy 2 weeks postop -snf placement   Essential hypertension --Continue home HCTZ and lisinopril  Non-Hodgkin's lymphoma Report received hyperthermia treatment in [place] 8 years ago Report no prior history of traditional treatment Report he is in remission  Constipation: start stool softener  DVT Prophylaxis:asa 325 per ortho recommendation  Code Status: full  Family Communication: patient , daughter Lattie Haw over the phone on 1/21  Disposition Plan: SNF   Consultants:  ortho  Procedures:  Removal of retrograde  locked left femoral nail. [employer]. Placement of left [employer] affixes trochanteric nail 11 mm short with 120 lag screw and 40 mm  interlock.  Antibiotics:  Perioperative Ancef on January 18   Discharge Exam: BP (!) 111/52 (BP Location: Right Arm)   Pulse 85   Temp 98.3 F (36.8 C) (Oral)   Resp 17   Ht 5\' 9"  (1.753 m)   Wt 86.5 kg   SpO2 97%   BMI 28.16 kg/m    General:  NAD, pleasant  Cardiovascular: RRR  Respiratory: CTABL  Abdomen: Soft/ND/NT, positive BS  Musculoskeletal: No Edema, left hip post op changes, neurovascular intact distally  Neuro: alert, oriented     Discharge Instructions You were cared for by a hospitalist during your hospital stay. If you have any questions about your discharge medications or the care you received while you were in the hospital after you are discharged, you can call the unit and asked to speak with the hospitalist on call if the hospitalist that took care of you is not available. Once you are discharged, your primary care physician will handle any further medical issues. Please note that NO REFILLS for any discharge medications will be authorized once you are discharged, as it is imperative that you return to your primary care physician (or establish a relationship with a primary care physician if you do not have one) for your aftercare needs so that they can reassess your need for medications and monitor your lab values.  Discharge Instructions    Diet - low sodium heart healthy   Complete by: As directed    Increase activity slowly   Complete by: As directed      Allergies as of 10/11/2019      Reactions   Morphine And Related Anxiety   HALLUCINATIONS   Iohexol     Desc: pt. broke out in red pimples 3 days after his ct scan w/contrast, ok with pre-meds   Sulfa Antibiotics Hives      Medication List    TAKE these medications   aspirin 325 MG tablet Take 1 tablet (325 mg total) by mouth daily.   cholecalciferol 1000 units tablet Commonly known as: VITAMIN D Take 15,000 Units by mouth daily.   Eylea 2 MG/0.05ML Soln Generic drug:  Aflibercept Inject 0.5 mLs into the eye as directed. Take every 6 wks in eye doctor office.   lisinopril-hydrochlorothiazide 20-12.5 MG tablet Commonly known as: ZESTORETIC Take 2 tablets by mouth daily.   multivitamin tablet Take 1 tablet by mouth daily.   oxyCODONE-acetaminophen 5-325 MG tablet Commonly known as: PERCOCET/ROXICET Take 1 tablet by mouth every 6 (six) hours as needed for severe pain.   polyethylene glycol 17 g packet Commonly known as: MIRALAX / GLYCOLAX Take 17 g by mouth daily.   PROBIOTIC FORMULA PO Take 1 tablet by mouth daily.   QC TUMERIC COMPLEX PO Take 1 tablet by mouth daily.   senna-docusate 8.6-50 MG tablet Commonly known as: Senokot-S Take 1 tablet by mouth at bedtime.   vitamin C 1000 MG tablet Take 2,000 mg by mouth daily.   Zinc 30 MG Tabs Take 1 tablet by mouth every morning.      Allergies  Allergen Reactions  . Morphine And Related Anxiety    HALLUCINATIONS  . Iohexol      Desc: pt. broke out in red pimples 3 days after his ct scan w/contrast, ok with pre-meds   . Sulfa Antibiotics Hives   Follow-up Information  Marybelle Killings, MD. Schedule an appointment as soon as possible for a visit today.   Specialty: Orthopedic Surgery Why: needs return office visit with Dr Lorin Mercy 2 weeks postop.  call to schedule appointment.  Contact information: [place] [address] [phone]        [name], MD Follow up.   Specialty: Family Medicine Contact information: [place] [address] [phone]            The results of significant diagnostics from this hospitalization (including imaging, microbiology, ancillary and laboratory) are listed below for reference.    Significant Diagnostic Studies: Pelvis Portable  Result Date: 10/06/2019 CLINICAL DATA:  The patient suffered a left intertrochanteric fracture 10/04/2019 in a fall. Initial encounter. EXAM: PORTABLE PELVIS  1-2 VIEWS COMPARISON:  Plain films left hip 10/04/2019. FINDINGS: Intramedullary nail in the left femur seen on the prior exam has been removed. Two hip screws with a short intramedullary nail and a single distal screw are now in place. Position and alignment of the patient's left intertrochanteric fracture are improved. The lesser trochanter is a separate fragment. IMPRESSION: Status post fixation of a left intertrochanteric fracture. No acute finding. Electronically Signed   By: Inge Rise M.D.   On: 10/06/2019 14:38   DG Chest Portable 1 View  Result Date: 10/04/2019 CLINICAL DATA:  Hip fracture, preoperative EXAM: PORTABLE CHEST 1 VIEW COMPARISON:  CT chest 05/31/2012, radiograph 12/08/2008 FINDINGS: Right subclavian approach Port-A-Cath tip terminates at the right atrium. The aorta is calcified. The remaining cardiomediastinal contours are unremarkable. Chronic elevation of the right hemidiaphragm is similar to multiple remote comparison is. There are diffusely coarsened interstitial changes which could reflect chronic and slightly worsening interstitial fibrosis seen in more early stages on comparison CT. Partially visualized hardware in the left humerus. Degenerative changes are present in the imaged spine and shoulders, left greater than right. Telemetry leads overlie the chest. IMPRESSION: No acute cardiopulmonary abnormality. Diffusely coarsened interstitial changes which could reflect chronic and slightly worsening interstitial fibrotic features seen in more early stages on comparison CT. Aortic Atherosclerosis (ICD10-I70.0). Electronically Signed   By: Lovena Le M.D.   On: 10/04/2019 16:40   DG Knee Left Port  Result Date: 10/04/2019 CLINICAL DATA:  Status post fall. EXAM: PORTABLE LEFT KNEE - 1-2 VIEW COMPARISON:  None. FINDINGS: No evidence of fracture, or dislocation. Small suprapatellar joint effusion. Intact distal tip of left femoral intramedullary rod. Lateral suprapatellar soft  tissue swelling. IMPRESSION: 1. No acute fracture or dislocation identified about the left knee. 2. Small suprapatellar joint effusion. Electronically Signed   By: Fidela Salisbury M.D.   On: 10/04/2019 18:21   DG C-Arm 1-60 Min  Result Date: 10/06/2019 CLINICAL DATA:  Left hip intramedullary nail placement FLUOROSCOPY TIME:  1 minutes 34 seconds. Images: 4 EXAM: DG HIP (WITH OR WITHOUT PELVIS) 2-3V LEFT; DG C-ARM 1-60 MIN COMPARISON:  October 04, 2019 FINDINGS: An intramedullary rod has been placed in the proximal left femur. A distal interlocking screw has been placed. Two gamma nails are in place. IMPRESSION: Left hip fracture repair as above. Electronically Signed   By: Dorise Bullion III M.D   On: 10/06/2019 13:43   DG Hip Port [place] Pelvis 1 View Left  Result Date: 10/04/2019 CLINICAL DATA:  Fall, left hip deformity EXAM: DG HIP (WITH OR WITHOUT PELVIS) 1V PORT LEFT COMPARISON:  CT abdomen pelvis 01/06/2015 FINDINGS: There is a comminuted intertrochanteric fracture of  the left femur with pronounced fragmentation and partial retraction of the lesser trochanter. Extensive adjacent soft tissue swelling. Femoral heads remain normally located. Prior bilateral femoral intramedullary nail placements are present. The proximal extent of a right intramedullary nail secured by fixation screws the left demonstrating screw tract lucency with absent fixation screws. Partially visualized remote posttraumatic deformity of the proximal right femur. Remaining bones of the pelvis are intact without abnormal diastatic widening of the symphysis or SI joints. Portions of the sacrum are obscured by overlying bowel gas. Likely remote posttraumatic deformity of the right inferior pubic ramus. The osseous structures appear diffusely demineralized which may limit detection of small or nondisplaced fractures. IMPRESSION: Comminuted intertrochanteric fracture of the left femur with pronounced fragmentation and partial  retraction of the lesser trochanter. Extensive adjacent soft tissue swelling. Femoral heads remain normally located. Remaining bones of the pelvis remain intact and congruent. Prior bilateral femoral intramedullary nail placement. Electronically Signed   By: Lovena Le M.D.   On: 10/04/2019 16:16   DG HIP UNILAT WITH PELVIS 2-3 VIEWS LEFT  Result Date: 10/06/2019 CLINICAL DATA:  Left hip intramedullary nail placement FLUOROSCOPY TIME:  1 minutes 34 seconds. Images: 4 EXAM: DG HIP (WITH OR WITHOUT PELVIS) 2-3V LEFT; DG C-ARM 1-60 MIN COMPARISON:  October 04, 2019 FINDINGS: An intramedullary rod has been placed in the proximal left femur. A distal interlocking screw has been placed. Two gamma nails are in place. IMPRESSION: Left hip fracture repair as above. Electronically Signed   By: Dorise Bullion III M.D   On: 10/06/2019 13:43    Microbiology: Recent Results (from the past 240 hour(s))  Respiratory Panel by RT PCR (Flu A&B, Covid) - Nasopharyngeal Swab     Status: None   Collection Time: 10/04/19  3:41 PM   Specimen: Nasopharyngeal Swab  Result Value Ref Range Status   SARS Coronavirus 2 by RT PCR NEGATIVE NEGATIVE Final    Comment: (NOTE) SARS-CoV-2 target nucleic acids are NOT DETECTED. The SARS-CoV-2 RNA is generally detectable in upper respiratoy specimens during the acute phase of infection. The lowest concentration of SARS-CoV-2 viral copies this assay can detect is 131 copies/mL. A negative result does not preclude SARS-Cov-2 infection and should not be used as the sole basis for treatment or other patient management decisions. A negative result may occur with  improper specimen collection/handling, submission of specimen other than nasopharyngeal swab, presence of viral mutation(s) within the areas targeted by this assay, and inadequate number of viral copies (<131 copies/mL). A negative result must be combined with clinical observations, patient history, and epidemiological  information. The expected result is Negative. Fact Sheet for Patients:  PinkCheek.be Fact Sheet for Healthcare Providers:  GravelBags.it This test is not yet ap proved or cleared by the [place] FDA and  has been authorized for detection and/or diagnosis of SARS-CoV-2 by FDA under an Emergency Use Authorization (EUA). This EUA will remain  in effect (meaning this test can be used) for the duration of the COVID-19 declaration under Section 564(b)(1) of the Act, 21 U.S.C. section 360bbb-3(b)(1), unless the authorization is terminated or revoked sooner.    Influenza A by PCR NEGATIVE NEGATIVE Final   Influenza B by PCR NEGATIVE NEGATIVE Final    Comment: (NOTE) The Xpert Xpress SARS-CoV-2/FLU/RSV assay is intended as an aid in  the diagnosis of influenza from Nasopharyngeal swab specimens and  should not be used as a sole basis for treatment. Nasal washings and  aspirates are unacceptable for Xpert Xpress  SARS-CoV-2/FLU/RSV  testing. Fact Sheet for Patients: PinkCheek.be Fact Sheet for Healthcare Providers: GravelBags.it This test is not yet approved or cleared by the [place] FDA and  has been authorized for detection and/or diagnosis of SARS-CoV-2 by  FDA under an Emergency Use Authorization (EUA). This EUA will remain  in effect (meaning this test can be used) for the duration of the  Covid-19 declaration under Section 564(b)(1) of the Act, 21  U.S.C. section 360bbb-3(b)(1), unless the authorization is  terminated or revoked. Performed at [facility], [place] [address] [place], [place] [address]   MRSA PCR Screening     Status: None   Collection Time: 10/04/19  9:50 PM   Specimen: Nasal Mucosa; Nasopharyngeal  Result Value Ref Range Status   MRSA by PCR NEGATIVE NEGATIVE Final    Comment:        The GeneXpert MRSA Assay (FDA approved for NASAL  specimens only), is one component of a comprehensive MRSA colonization surveillance program. It is not intended to diagnose MRSA infection nor to guide or monitor treatment for MRSA infections. Performed at [facility], [place] [address], [place], [place] [address]   SARS CORONAVIRUS 2 (TAT 6-24 HRS) Nasopharyngeal Nasopharyngeal Swab     Status: None   Collection Time: 10/08/19  1:24 PM   Specimen: Nasopharyngeal Swab  Result Value Ref Range Status   SARS Coronavirus 2 NEGATIVE NEGATIVE Final    Comment: (NOTE) SARS-CoV-2 target nucleic acids are NOT DETECTED. The SARS-CoV-2 RNA is generally detectable in upper and lower respiratory specimens during the acute phase of infection. Negative results do not preclude SARS-CoV-2 infection, do not rule out co-infections with other pathogens, and should not be used as the sole basis for treatment or other patient management decisions. Negative results must be combined with clinical observations, patient history, and epidemiological information. The expected result is Negative. Fact Sheet for Patients: SugarRoll.be Fact Sheet for Healthcare Providers: https://www.woods-mathews.com/ This test is not yet approved or cleared by the [place] FDA and  has been authorized for detection and/or diagnosis of SARS-CoV-2 by FDA under an Emergency Use Authorization (EUA). This EUA will remain  in effect (meaning this test can be used) for the duration of the COVID-19 declaration under Section 56 4(b)(1) of the Act, 21 U.S.C. section 360bbb-3(b)(1), unless the authorization is terminated or revoked sooner. Performed at [facility], [place] [address], [place], [place] [address]      Labs: Basic Metabolic Panel: Recent Labs  Lab 10/05/19 0606 10/06/19 0437 10/07/19 0758 10/08/19 0240 10/09/19 0242  NA 139 137 136 135 138  K 4.0 4.0 3.6 4.0 3.5  CL 106 102 103 101 102  CO2 24 27 26 27  25   GLUCOSE 127* 161* 113* 113* 106*  BUN 17 12 11 14 20   CREATININE 1.14 1.16 1.18 1.22 1.14  CALCIUM 9.0 8.7* 8.2* 8.4* 8.7*  MG 2.0  --   --   --   --    Liver Function Tests: Recent Labs  Lab 10/04/19 1541 10/09/19 0242  AST 22 27  ALT 20 21  ALKPHOS 57 56  BILITOT 1.4* 1.3*  PROT 6.9 5.8*  ALBUMIN 4.1 3.0*   No results for input(s): LIPASE, AMYLASE in the last 168 hours. No results for input(s): AMMONIA in the last 168 hours. CBC: Recent Labs  Lab 10/04/19 1541 10/04/19 1541 10/05/19 0606 10/06/19 0437 10/07/19 0758 10/08/19 0240 10/09/19 0242  WBC 8.3   < >  11.1* 9.4 8.4 9.3 7.3  NEUTROABS 6.3  --   --   --   --   --  4.8  HGB 15.6   < > 13.2 12.3* 10.1* 9.9* 10.0*  HCT 48.4   < > 40.6 38.2* 31.3* 31.0* 30.5*  MCV 89.3   < > 88.5 89.3 89.2 89.6 87.1  PLT 180   < > 167 138* 136* 165 177   < > = values in this interval not displayed.   Cardiac Enzymes: No results for input(s): CKTOTAL, CKMB, CKMBINDEX, TROPONINI in the last 168 hours. BNP: BNP (last 3 results) No results for input(s): BNP in the last 8760 hours.  ProBNP (last 3 results) No results for input(s): PROBNP in the last 8760 hours.  CBG: No results for input(s): GLUCAP in the last 168 hours.     Signed:  Florencia Reasons MD, PhD, FACP  [facility] 10/11/2019, 3:05 PM

## 2019-10-09 NOTE — Clinical Social Work Note (Signed)
Family has chosen [facility]. CSW has alerted [facility]. Waiting on response as patient is medically ready to discharge.

## 2019-10-09 NOTE — Plan of Care (Signed)

## 2019-10-10 MED ORDER — LOPERAMIDE HCL 2 MG PO CAPS
2.0000 mg | ORAL_CAPSULE | Freq: Once | ORAL | Status: AC
  Administered 2019-10-10: 2 mg via ORAL
  Filled 2019-10-10: qty 1

## 2019-10-10 NOTE — Plan of Care (Signed)

## 2019-10-10 NOTE — Progress Notes (Signed)
Occupational Therapy Treatment Patient Details Name: [name] [name] MRN: [id number] DOB: 1941/03/24 Today's Date: 10/10/2019    History of present illness Pt is a 79 y/o male with PMH of lymphoma previous femur fractures treated with intramedullary rods and mechanical fall with left intertrochanteric hip fracture.  Previous femur fractures were fixed with retrograde rods and his left hip fracture is above the left intramedullary femoral nail that ends in the subtroches region.  Patient has history of hypertension.Pt now s/p IM nail 10/06/19   OT comments  Pt. Seen for skilled OT treatment session.  Pt. Completed bed mobility and standing pericare.  Also able to amb. To b.room for additional toileting needs.    Follow Up Recommendations  CIR;Supervision - Intermittent    Equipment Recommendations  3 in 1 bedside commode    Recommendations for Other Services      Precautions / Restrictions Precautions Precautions: Fall Restrictions Weight Bearing Restrictions: Yes LLE Weight Bearing: Partial weight bearing LLE Partial Weight Bearing Percentage or Pounds: 50%       Mobility Bed Mobility Overal bed mobility: Needs Assistance Bed Mobility: Sit to Supine     Supine to sit: HOB elevated;Min assist        Transfers Overall transfer level: Needs assistance Equipment used: Rolling walker (2 wheeled) Transfers: Sit to/from [facility] Sit to Stand: Min assist Stand pivot transfers: Min assist       General transfer comment: good technique and hand placement utilized    Balance                                           ADL either performed or assessed with clinical judgement   ADL Overall ADL's : Needs assistance/impaired                         Toilet Transfer: Minimal assistance;Ambulation;RW Toilet Transfer Details (indicate cue type and reason): initially on bed pan upon arrival.  agrees to roll R for bed pan removal.   for cleaning after bm and applying a special cream he had, pt. agreed to perform this in standing but then had to use b.room again and was assisted to b.room, difficulty determining if he was maintaining wbs during ambulation Toileting- Clothing Manipulation and Hygiene: Minimal assistance;Sit to/from stand Toileting - Clothing Manipulation Details (indicate cue type and reason): initially in standing to perform care after bm on bed pan.  pt. very particular about this process.  requested a glove instructed on how he folds the toilet paper, and the cream he needed to apply.  reviewed he needed to attempt to perform as able as that is the focus to see how he does with his home care/self care.  pt. also requesting to look inside the bedpan and see "how much he produced and what it looked like".  provided bedpan for pt. to examine.  pt. left in b.room for 2nd bm with instuctions to pull string for cna assistance once finished     Functional mobility during ADLs: Minimal assistance General ADL Comments: initially in standing to perform care after bm on bed pan.  pt. very particular about this process.  requested a glove instructed on how he folds the toilet paper, and the cream he needed to apply.  reviewed he needed to attempt to perform as able as that is the focus to  see how he does with his home care/self care.  pt. also requesting to look inside the bedpan and see "how much he produced and what it looked like".  provided bedpan for pt. to examine.  pt. left in b.room for 2nd bm with instuctions to pull string for cna assistance once finished     Vision       Perception     Praxis      Cognition Arousal/Alertness: Awake/alert Behavior During Therapy: WFL for tasks assessed/performed Overall Cognitive Status: Within Functional Limits for tasks assessed                                          Exercises     Shoulder Instructions       General Comments      Pertinent  Vitals/ Pain       Pain Assessment: No/denies pain  Home Living                                          Prior Functioning/Environment              Frequency  Min 2X/week        Progress Toward Goals  OT Goals(current goals can now be found in the care plan section)  Progress towards OT goals: Progressing toward goals     Plan      Co-evaluation                 AM-PAC OT "6 Clicks" Daily Activity     Outcome Measure   Help from another person eating meals?: None Help from another person taking care of personal grooming?: A Little Help from another person toileting, which includes using toliet, bedpan, or urinal?: A Lot Help from another person bathing (including washing, rinsing, drying)?: A Lot Help from another person to put on and taking off regular upper body clothing?: A Little Help from another person to put on and taking off regular lower body clothing?: Total 6 Click Score: 15    End of Session Equipment Utilized During Treatment: Rolling walker  OT Visit Diagnosis: Unsteadiness on feet (R26.81);Other abnormalities of gait and mobility (R26.89);Muscle weakness (generalized) (M62.81);Pain Pain - Right/Left: Left Pain - part of body: Leg   Activity Tolerance Patient tolerated treatment well   Patient Left Other (comment)(in b.room with inst. to pull string for assistance, he requested his foot be placed on a trash can)   Nurse Communication Other (comment)(spoked to CNA to alert her pt. in b.room. she said he had already called her and she was assisting him)        Time: [id number] OT Time Calculation (min): 18 min  Charges: OT General Charges $OT Visit: 1 Visit OT Treatments $Self Care/Home Management : 8-22 mins  Sonia Baller, [place]   Janice Coffin 10/10/2019, 2:19 PM

## 2019-10-10 NOTE — Clinical Social Work Note (Signed)
Patient continues to be medically ready for discharge. Family has chosen [facility]. CSW has been unable to get in contact with admissions coordinator Sheila. TOC leadership aware and actively working on this situation. 

## 2019-10-10 NOTE — Care Management Important Message (Signed)
Important Message  Patient Details  Name: [name] [name] MRN: [id number] Date of Birth: 19-Apr-1941   Medicare Important Message Given:  Yes     Memory Argue 10/10/2019, 1:19 PM

## 2019-10-11 DIAGNOSIS — M1990 Unspecified osteoarthritis, unspecified site: Secondary | ICD-10-CM | POA: Diagnosis not present

## 2019-10-11 DIAGNOSIS — H3410 Central retinal artery occlusion, unspecified eye: Secondary | ICD-10-CM | POA: Diagnosis not present

## 2019-10-11 DIAGNOSIS — R2689 Other abnormalities of gait and mobility: Secondary | ICD-10-CM | POA: Diagnosis not present

## 2019-10-11 DIAGNOSIS — I700 Atherosclerosis of aorta: Secondary | ICD-10-CM | POA: Diagnosis not present

## 2019-10-11 DIAGNOSIS — R2681 Unsteadiness on feet: Secondary | ICD-10-CM | POA: Diagnosis not present

## 2019-10-11 DIAGNOSIS — M2550 Pain in unspecified joint: Secondary | ICD-10-CM | POA: Diagnosis not present

## 2019-10-11 DIAGNOSIS — Z7401 Bed confinement status: Secondary | ICD-10-CM | POA: Diagnosis not present

## 2019-10-11 DIAGNOSIS — R5381 Other malaise: Secondary | ICD-10-CM | POA: Diagnosis not present

## 2019-10-11 DIAGNOSIS — K5900 Constipation, unspecified: Secondary | ICD-10-CM | POA: Diagnosis not present

## 2019-10-11 DIAGNOSIS — Z20828 Contact with and (suspected) exposure to other viral communicable diseases: Secondary | ICD-10-CM | POA: Diagnosis not present

## 2019-10-11 DIAGNOSIS — S72122D Displaced fracture of lesser trochanter of left femur, subsequent encounter for closed fracture with routine healing: Secondary | ICD-10-CM | POA: Diagnosis not present

## 2019-10-11 DIAGNOSIS — W19XXXA Unspecified fall, initial encounter: Secondary | ICD-10-CM | POA: Diagnosis not present

## 2019-10-11 DIAGNOSIS — I10 Essential (primary) hypertension: Secondary | ICD-10-CM | POA: Diagnosis not present

## 2019-10-11 DIAGNOSIS — S72002A Fracture of unspecified part of neck of left femur, initial encounter for closed fracture: Secondary | ICD-10-CM | POA: Diagnosis not present

## 2019-10-11 DIAGNOSIS — M6281 Muscle weakness (generalized): Secondary | ICD-10-CM | POA: Diagnosis not present

## 2019-10-11 DIAGNOSIS — M79605 Pain in left leg: Secondary | ICD-10-CM | POA: Diagnosis not present

## 2019-10-11 DIAGNOSIS — Y92009 Unspecified place in unspecified non-institutional (private) residence as the place of occurrence of the external cause: Secondary | ICD-10-CM | POA: Diagnosis not present

## 2019-10-11 DIAGNOSIS — C8590 Non-Hodgkin lymphoma, unspecified, unspecified site: Secondary | ICD-10-CM | POA: Diagnosis not present

## 2019-10-11 NOTE — Progress Notes (Signed)
     Subjective: 5 Days Post-Op Procedure(s) (LRB): INTRAMEDULLARY (IM) RETROGRADE FEMORAL NAIL REMOVAL ( [employer]) (Left) BIOMET AFFIXUS SHORT NAIL,  INTERTROCHANTRIC (Left)Awake, alert, oriented x4 Does not want COVID-19 Vaccination. I discussed the risks and he is Reluctant to try the new vaccination.  Patient reports pain as moderate.    Objective:   VITALS:  Temp:  [97.7 F (36.5 C)-98.2 F (36.8 C)] 97.7 F (36.5 C) (01/23 0803) Pulse Rate:  [71-91] 71 (01/23 0803) Resp:  [18] 18 (01/23 0803) BP: (105-124)/(53-69) 124/69 (01/23 0803) SpO2:  [95 %-100 %] 100 % (01/23 0803)  Neurologically intact ABD soft Neurovascular intact Sensation intact distally Intact pulses distally Dorsiflexion/Plantar flexion intact Incision: dressing C/D/I and no drainage No cellulitis present Compartment soft   LABS Recent Labs    10/09/19 0242  HGB 10.0*  WBC 7.3  PLT 177   Recent Labs    10/09/19 0242  NA 138  K 3.5  CL 102  CO2 25  BUN 20  CREATININE 1.14  GLUCOSE 106*   No results for input(s): LABPT, INR in the last 72 hours.   Assessment/Plan: 5 Days Post-Op Procedure(s) (LRB): INTRAMEDULLARY (IM) RETROGRADE FEMORAL NAIL REMOVAL ( [employer]) (Left) BIOMET AFFIXUS SHORT NAIL,  INTERTROCHANTRIC (Left)  Advance diet Up with therapy Discharge to SNF  Basil Dess 10/11/2019, 12:57 PM

## 2019-10-11 NOTE — Plan of Care (Signed)
  Problem: Education: Goal: Knowledge of General Education information will improve Description: Including pain rating scale, medication(s)/side effects and non-pharmacologic comfort measures Outcome: Progressing   Problem: Health Behavior/Discharge Planning: Goal: Ability to manage health-related needs will improve Outcome: Progressing   Problem: Coping: Goal: Level of anxiety will decrease Outcome: Progressing   Problem: Elimination: Goal: Will not experience complications related to bowel motility Outcome: Progressing   Problem: Pain Managment: Goal: General experience of comfort will improve Outcome: Progressing   Problem: Safety: Goal: Ability to remain free from injury will improve Outcome: Progressing

## 2019-10-11 NOTE — Progress Notes (Addendum)
Pt belongings gathered and report given to [facility] at the receiving facility. PTAR called by CSW and pt/family aware of d/c orders.  1911- pt received by ptar and is being transported to facilty

## 2019-10-11 NOTE — Progress Notes (Signed)
Uneventful night, medically ready to discharge to rehab facility, per social worker awaiting for insurance authorization.

## 2019-10-11 NOTE — TOC Transition Note (Signed)
Transition of Care [facility]) - CM/SW Discharge Note   Patient Details  Name: [name] [name] MRN: [id number] Date of Birth: 23-May-1941  [facility]) CM/SW Contact:  Bary Castilla, LCSW Phone Number: [phone] 10/11/2019, 3:11 PM   Clinical Narrative:    Patient will DC to: [facility]? Anticipated DC date: 10/11/19? Family notified: Lattie Haw daughter? Transport [id number]   Per MD patient ready for DC to [facility]?. RN, patient, patient's family, and facility notified of DC. Discharge Summary sent to facility. RN given number for report [place] packet on chart. Ambulance transport requested for patient.  CSW signing off.   [place], [place] [phone]    Final next level of care: Skilled Nursing Facility Barriers to Discharge: No Barriers Identified   Patient Goals and CMS Choice Patient states their goals for this hospitalization and ongoing recovery are:: get back to how I was before CMS Medicare.gov Compare Post Acute Care list provided to:: Patient Represenative (must comment)(Apphia Cropley Noberto Retort) Choice offered to / list presented to : Adult Children  Discharge Placement              Patient chooses bed at: [facility] Patient to be transferred to facility by: [place] Name of family member notified: Daughter Patient and family notified of of transfer: 10/11/19  Discharge Plan and Services In-house Referral: Clinical Social Work   Post Acute Care Choice: [facility]                               Social Determinants of Health (SDOH) Interventions     Readmission Risk Interventions No flowsheet data found.

## 2019-10-11 NOTE — Plan of Care (Signed)

## 2019-10-15 DIAGNOSIS — S72002A Fracture of unspecified part of neck of left femur, initial encounter for closed fracture: Secondary | ICD-10-CM | POA: Diagnosis not present

## 2019-10-15 DIAGNOSIS — I10 Essential (primary) hypertension: Secondary | ICD-10-CM | POA: Diagnosis not present

## 2019-10-15 DIAGNOSIS — C8590 Non-Hodgkin lymphoma, unspecified, unspecified site: Secondary | ICD-10-CM | POA: Diagnosis not present

## 2019-10-15 DIAGNOSIS — M1990 Unspecified osteoarthritis, unspecified site: Secondary | ICD-10-CM | POA: Diagnosis not present

## 2019-10-21 ENCOUNTER — Other Ambulatory Visit: Payer: Self-pay

## 2019-10-21 ENCOUNTER — Ambulatory Visit (INDEPENDENT_AMBULATORY_CARE_PROVIDER_SITE_OTHER): Payer: Medicare HMO | Admitting: Orthopaedic Surgery

## 2019-10-21 ENCOUNTER — Encounter: Payer: Self-pay | Admitting: Orthopaedic Surgery

## 2019-10-21 ENCOUNTER — Ambulatory Visit (INDEPENDENT_AMBULATORY_CARE_PROVIDER_SITE_OTHER): Payer: Medicare HMO

## 2019-10-21 VITALS — Ht 69.0 in | Wt 190.0 lb

## 2019-10-21 DIAGNOSIS — M79605 Pain in left leg: Secondary | ICD-10-CM

## 2019-10-21 NOTE — Progress Notes (Signed)
   Post-Op Visit Note   Patient: [name] [name]           Date of Birth: Aug 14, 1941           MRN: [id number] Visit Date: 10/21/2019 PCP: [name], MD   Assessment & Plan: Jodell Cipro removed continue 50% weightbearing.  Return 4 weeks he should be able to begin weightbearing as tolerated after x-rays at that time.  Chief Complaint:  Chief Complaint  Patient presents with  . Left Leg - Routine Post Op    10/06/2019 Removal Left Femoral Nail, Placement Left Affixus Troch Nail   Visit Diagnoses:  1. Pain in left leg     Plan: Right 50 sent weightbearing for 4 weeks return 4 weeks repeat x-rays in 4 weeks.  Follow-Up Instructions: No follow-ups on file.   Orders:  Orders Placed This Encounter  Procedures  . XR FEMUR MIN 2 VIEWS LEFT   No orders of the defined types were placed in this encounter.   Imaging: No results found.  PMFS History: Patient Active Problem List   Diagnosis Date Noted  . Closed left hip fracture ([place]) 10/04/2019  . Essential hypertension 10/04/2019  . Fall   . Port-A-Cath in place 02/04/2019  . Thrombocytopenia ([place]) 01/22/2018  . 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 History of arthroscopy of left shoulder 07/12/2016  . Port catheter in place 02/22/2016  . 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 Vitamin D deficiency 11/21/2012  . Lymphoma ([place]) 06/13/2012   Past Medical History:  Diagnosis Date  . Femur fracture ([place])   . Hip fracture ([place])   . Hypertension   . Malignant neoplasm of pancreas, part unspecified   . Other malignant lymphomas, unspecified site, extranodal and solid organ sites     No family history on file.  Past Surgical History:  Procedure Laterality Date  . FEMUR IM NAIL Left 10/06/2019   Procedure: INTRAMEDULLARY (IM) RETROGRADE FEMORAL NAIL REMOVAL ( [employer]);  Surgeon: Marybelle Killings, MD;  Location: [facility];   Service: Orthopedics;  Laterality: Left;  . FEMUR SURGERY Bilateral   . INTRAMEDULLARY (IM) NAIL INTERTROCHANTERIC Left 10/06/2019   Procedure: BIOMET AFFIXUS SHORT NAIL,  INTERTROCHANTRIC;  Surgeon: Marybelle Killings, MD;  Location: [facility];  Service: Orthopedics;  Laterality: Left;  . LEG SURGERY Left   . SHOULDER SURGERY Left    Social History   Occupational History  . Not on file  Tobacco Use  . Smoking status: Never Smoker  . Smokeless tobacco: Never Used  Substance and Sexual Activity  . Alcohol use: No  . Drug use: No  . Sexual activity: Not on file

## 2019-10-22 ENCOUNTER — Telehealth: Payer: Self-pay | Admitting: Orthopaedic Surgery

## 2019-10-22 NOTE — Telephone Encounter (Signed)
Received vm from Suzi Roots daughter. Stating her father is at [facility] rehab facility and would like his records to be sent to [facility] as it is a Catering manager facility. She states she has called [employer] multiple times to request a transfer, but not unable to reach anyone. IC her back [phone] advised unfortunatley cannot send records without signed release from the patient.

## 2019-10-23 DIAGNOSIS — M1990 Unspecified osteoarthritis, unspecified site: Secondary | ICD-10-CM | POA: Diagnosis not present

## 2019-10-23 DIAGNOSIS — H3410 Central retinal artery occlusion, unspecified eye: Secondary | ICD-10-CM | POA: Diagnosis not present

## 2019-10-27 DIAGNOSIS — S72122D Displaced fracture of lesser trochanter of left femur, subsequent encounter for closed fracture with routine healing: Secondary | ICD-10-CM | POA: Diagnosis not present

## 2019-10-27 DIAGNOSIS — R2681 Unsteadiness on feet: Secondary | ICD-10-CM | POA: Diagnosis not present

## 2019-10-27 DIAGNOSIS — M6281 Muscle weakness (generalized): Secondary | ICD-10-CM | POA: Diagnosis not present

## 2019-10-27 DIAGNOSIS — R2689 Other abnormalities of gait and mobility: Secondary | ICD-10-CM | POA: Diagnosis not present

## 2019-10-28 DIAGNOSIS — S72122D Displaced fracture of lesser trochanter of left femur, subsequent encounter for closed fracture with routine healing: Secondary | ICD-10-CM | POA: Diagnosis not present

## 2019-10-28 DIAGNOSIS — R2689 Other abnormalities of gait and mobility: Secondary | ICD-10-CM | POA: Diagnosis not present

## 2019-10-28 DIAGNOSIS — M6281 Muscle weakness (generalized): Secondary | ICD-10-CM | POA: Diagnosis not present

## 2019-10-28 DIAGNOSIS — R2681 Unsteadiness on feet: Secondary | ICD-10-CM | POA: Diagnosis not present

## 2019-10-29 DIAGNOSIS — R2689 Other abnormalities of gait and mobility: Secondary | ICD-10-CM | POA: Diagnosis not present

## 2019-10-29 DIAGNOSIS — S72122D Displaced fracture of lesser trochanter of left femur, subsequent encounter for closed fracture with routine healing: Secondary | ICD-10-CM | POA: Diagnosis not present

## 2019-10-29 DIAGNOSIS — M6281 Muscle weakness (generalized): Secondary | ICD-10-CM | POA: Diagnosis not present

## 2019-10-29 DIAGNOSIS — R2681 Unsteadiness on feet: Secondary | ICD-10-CM | POA: Diagnosis not present

## 2019-10-30 ENCOUNTER — Telehealth: Payer: Self-pay | Admitting: Orthopaedic Surgery

## 2019-10-30 DIAGNOSIS — S72002A Fracture of unspecified part of neck of left femur, initial encounter for closed fracture: Secondary | ICD-10-CM | POA: Diagnosis not present

## 2019-10-30 DIAGNOSIS — H3410 Central retinal artery occlusion, unspecified eye: Secondary | ICD-10-CM | POA: Diagnosis not present

## 2019-10-30 DIAGNOSIS — I10 Essential (primary) hypertension: Secondary | ICD-10-CM | POA: Diagnosis not present

## 2019-10-30 DIAGNOSIS — C8590 Non-Hodgkin lymphoma, unspecified, unspecified site: Secondary | ICD-10-CM | POA: Diagnosis not present

## 2019-10-30 NOTE — Telephone Encounter (Signed)
Please advise 

## 2019-10-30 NOTE — Telephone Encounter (Signed)
Patient's daughter called.   She is requesting a call back regarding the patient's care. He is currently at a nursing facility and they want to discuss getting him moved to the house  Call back number: [phone]

## 2019-10-30 NOTE — Telephone Encounter (Signed)
I called and discussed in detail. Daughter lives in [place] and his house is in [place]. She is looking in to sitters for him.

## 2019-10-30 NOTE — Telephone Encounter (Signed)
Patient requesting a handicap -sticker.  Please call patient [phone]

## 2019-10-31 NOTE — Telephone Encounter (Signed)
Please advise.  OK for placard?  For how long? 

## 2019-10-31 NOTE — Telephone Encounter (Signed)
Handicap placard at front desk for pick up.  Patient voicemail is full and unable to leave message.

## 2019-10-31 NOTE — Telephone Encounter (Signed)
4 months thanks

## 2019-11-03 NOTE — Telephone Encounter (Signed)
I called patient and advised. He will pick up at appointment next week.

## 2019-11-04 DIAGNOSIS — Z20828 Contact with and (suspected) exposure to other viral communicable diseases: Secondary | ICD-10-CM | POA: Diagnosis not present

## 2019-11-04 DIAGNOSIS — Z1383 Encounter for screening for respiratory disorder NEC: Secondary | ICD-10-CM | POA: Diagnosis not present

## 2019-11-06 DIAGNOSIS — R2681 Unsteadiness on feet: Secondary | ICD-10-CM | POA: Diagnosis not present

## 2019-11-06 DIAGNOSIS — R2689 Other abnormalities of gait and mobility: Secondary | ICD-10-CM | POA: Diagnosis not present

## 2019-11-06 DIAGNOSIS — M6281 Muscle weakness (generalized): Secondary | ICD-10-CM | POA: Diagnosis not present

## 2019-11-06 DIAGNOSIS — S72122D Displaced fracture of lesser trochanter of left femur, subsequent encounter for closed fracture with routine healing: Secondary | ICD-10-CM | POA: Diagnosis not present

## 2019-11-07 DIAGNOSIS — M6281 Muscle weakness (generalized): Secondary | ICD-10-CM | POA: Diagnosis not present

## 2019-11-07 DIAGNOSIS — R2689 Other abnormalities of gait and mobility: Secondary | ICD-10-CM | POA: Diagnosis not present

## 2019-11-07 DIAGNOSIS — R2681 Unsteadiness on feet: Secondary | ICD-10-CM | POA: Diagnosis not present

## 2019-11-07 DIAGNOSIS — S72122D Displaced fracture of lesser trochanter of left femur, subsequent encounter for closed fracture with routine healing: Secondary | ICD-10-CM | POA: Diagnosis not present

## 2019-11-08 DIAGNOSIS — M6281 Muscle weakness (generalized): Secondary | ICD-10-CM | POA: Diagnosis not present

## 2019-11-08 DIAGNOSIS — R2689 Other abnormalities of gait and mobility: Secondary | ICD-10-CM | POA: Diagnosis not present

## 2019-11-08 DIAGNOSIS — S72122D Displaced fracture of lesser trochanter of left femur, subsequent encounter for closed fracture with routine healing: Secondary | ICD-10-CM | POA: Diagnosis not present

## 2019-11-08 DIAGNOSIS — R2681 Unsteadiness on feet: Secondary | ICD-10-CM | POA: Diagnosis not present

## 2019-11-09 DIAGNOSIS — Z1383 Encounter for screening for respiratory disorder NEC: Secondary | ICD-10-CM | POA: Diagnosis not present

## 2019-11-09 DIAGNOSIS — Z20828 Contact with and (suspected) exposure to other viral communicable diseases: Secondary | ICD-10-CM | POA: Diagnosis not present

## 2019-11-10 DIAGNOSIS — M6281 Muscle weakness (generalized): Secondary | ICD-10-CM | POA: Diagnosis not present

## 2019-11-10 DIAGNOSIS — S72122D Displaced fracture of lesser trochanter of left femur, subsequent encounter for closed fracture with routine healing: Secondary | ICD-10-CM | POA: Diagnosis not present

## 2019-11-10 DIAGNOSIS — R2689 Other abnormalities of gait and mobility: Secondary | ICD-10-CM | POA: Diagnosis not present

## 2019-11-10 DIAGNOSIS — R2681 Unsteadiness on feet: Secondary | ICD-10-CM | POA: Diagnosis not present

## 2019-11-11 DIAGNOSIS — R2689 Other abnormalities of gait and mobility: Secondary | ICD-10-CM | POA: Diagnosis not present

## 2019-11-11 DIAGNOSIS — W19XXXA Unspecified fall, initial encounter: Secondary | ICD-10-CM | POA: Diagnosis not present

## 2019-11-11 DIAGNOSIS — R2681 Unsteadiness on feet: Secondary | ICD-10-CM | POA: Diagnosis not present

## 2019-11-11 DIAGNOSIS — S72122D Displaced fracture of lesser trochanter of left femur, subsequent encounter for closed fracture with routine healing: Secondary | ICD-10-CM | POA: Diagnosis not present

## 2019-11-11 DIAGNOSIS — M6281 Muscle weakness (generalized): Secondary | ICD-10-CM | POA: Diagnosis not present

## 2019-11-11 DIAGNOSIS — S72002A Fracture of unspecified part of neck of left femur, initial encounter for closed fracture: Secondary | ICD-10-CM | POA: Diagnosis not present

## 2019-11-12 DIAGNOSIS — M6281 Muscle weakness (generalized): Secondary | ICD-10-CM | POA: Diagnosis not present

## 2019-11-12 DIAGNOSIS — S72122D Displaced fracture of lesser trochanter of left femur, subsequent encounter for closed fracture with routine healing: Secondary | ICD-10-CM | POA: Diagnosis not present

## 2019-11-12 DIAGNOSIS — R2689 Other abnormalities of gait and mobility: Secondary | ICD-10-CM | POA: Diagnosis not present

## 2019-11-12 DIAGNOSIS — R2681 Unsteadiness on feet: Secondary | ICD-10-CM | POA: Diagnosis not present

## 2019-11-13 DIAGNOSIS — R2681 Unsteadiness on feet: Secondary | ICD-10-CM | POA: Diagnosis not present

## 2019-11-13 DIAGNOSIS — M6281 Muscle weakness (generalized): Secondary | ICD-10-CM | POA: Diagnosis not present

## 2019-11-13 DIAGNOSIS — R2689 Other abnormalities of gait and mobility: Secondary | ICD-10-CM | POA: Diagnosis not present

## 2019-11-13 DIAGNOSIS — S72122D Displaced fracture of lesser trochanter of left femur, subsequent encounter for closed fracture with routine healing: Secondary | ICD-10-CM | POA: Diagnosis not present

## 2019-11-14 DIAGNOSIS — M6281 Muscle weakness (generalized): Secondary | ICD-10-CM | POA: Diagnosis not present

## 2019-11-14 DIAGNOSIS — R2689 Other abnormalities of gait and mobility: Secondary | ICD-10-CM | POA: Diagnosis not present

## 2019-11-14 DIAGNOSIS — R2681 Unsteadiness on feet: Secondary | ICD-10-CM | POA: Diagnosis not present

## 2019-11-14 DIAGNOSIS — S72122D Displaced fracture of lesser trochanter of left femur, subsequent encounter for closed fracture with routine healing: Secondary | ICD-10-CM | POA: Diagnosis not present

## 2019-11-17 DIAGNOSIS — S72122D Displaced fracture of lesser trochanter of left femur, subsequent encounter for closed fracture with routine healing: Secondary | ICD-10-CM | POA: Diagnosis not present

## 2019-11-17 DIAGNOSIS — M6281 Muscle weakness (generalized): Secondary | ICD-10-CM | POA: Diagnosis not present

## 2019-11-17 DIAGNOSIS — R2681 Unsteadiness on feet: Secondary | ICD-10-CM | POA: Diagnosis not present

## 2019-11-17 DIAGNOSIS — R2689 Other abnormalities of gait and mobility: Secondary | ICD-10-CM | POA: Diagnosis not present

## 2019-11-18 ENCOUNTER — Ambulatory Visit (INDEPENDENT_AMBULATORY_CARE_PROVIDER_SITE_OTHER): Payer: Medicare HMO | Admitting: Orthopaedic Surgery

## 2019-11-18 ENCOUNTER — Encounter: Payer: Self-pay | Admitting: Orthopaedic Surgery

## 2019-11-18 ENCOUNTER — Other Ambulatory Visit: Payer: Self-pay

## 2019-11-18 ENCOUNTER — Ambulatory Visit (INDEPENDENT_AMBULATORY_CARE_PROVIDER_SITE_OTHER): Payer: Medicare HMO

## 2019-11-18 VITALS — Ht 69.0 in | Wt 190.0 lb

## 2019-11-18 DIAGNOSIS — S72122D Displaced fracture of lesser trochanter of left femur, subsequent encounter for closed fracture with routine healing: Secondary | ICD-10-CM | POA: Diagnosis not present

## 2019-11-18 DIAGNOSIS — M6281 Muscle weakness (generalized): Secondary | ICD-10-CM | POA: Diagnosis not present

## 2019-11-18 DIAGNOSIS — M79605 Pain in left leg: Secondary | ICD-10-CM | POA: Diagnosis not present

## 2019-11-18 DIAGNOSIS — R2689 Other abnormalities of gait and mobility: Secondary | ICD-10-CM | POA: Diagnosis not present

## 2019-11-18 DIAGNOSIS — Z1383 Encounter for screening for respiratory disorder NEC: Secondary | ICD-10-CM | POA: Diagnosis not present

## 2019-11-18 DIAGNOSIS — Z20828 Contact with and (suspected) exposure to other viral communicable diseases: Secondary | ICD-10-CM | POA: Diagnosis not present

## 2019-11-18 DIAGNOSIS — R2681 Unsteadiness on feet: Secondary | ICD-10-CM | POA: Diagnosis not present

## 2019-11-18 NOTE — Progress Notes (Signed)
   Post-Op Visit Note   Patient: [name] [name]           Date of Birth: December 26, 1940           MRN: [id number] Visit Date: 11/18/2019 PCP: [name], MD   Assessment & Plan: Postop TURP now 10/06/2019 left short nail with interlock.  He has had slight sliding of the barrel less than a centimeter.  Some interval healing and he had medial comminution.  He has been 50% weightbearing with pain may be 1 out of 10 at maximum 2 out of 10.  He can progress physical therapy weightbearing as tolerated with his walker and is getting a ramp finished transitioning back home.  His daughter lives 3 stories above and he can stay with her for several days and can work on getting a Actuary for when he goes back into his own home.  Return 1 month repeat x-rays on return.  Chief Complaint:  Chief Complaint  Patient presents with  . Left Leg - Follow-up    10/06/2019 Removal left femoral nail, placement let affixus troch nail   Visit Diagnoses:  1. Pain in left leg     Plan: Return 1 month repeat x-rays left hip on return.  Incision looks good.  Follow-Up Instructions: No follow-ups on file.   Orders:  Orders Placed This Encounter  Procedures  . XR FEMUR MIN 2 VIEWS LEFT   No orders of the defined types were placed in this encounter.   Imaging: No results found.  PMFS History: Patient Active Problem List   Diagnosis Date Noted  . Closed left hip fracture ([place]) 10/04/2019  . Essential hypertension 10/04/2019  . Fall   . Port-A-Cath in place 02/04/2019  . Thrombocytopenia ([place]) 01/22/2018  . 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 History of arthroscopy of left shoulder 07/12/2016  . Port catheter in place 02/22/2016  . 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 Vitamin D deficiency 11/21/2012  . Lymphoma ([place]) 06/13/2012   Past Medical History:  Diagnosis Date  . Femur fracture ([place])   .  Hip fracture ([place])   . Hypertension   . Malignant neoplasm of pancreas, part unspecified   . Other malignant lymphomas, unspecified site, extranodal and solid organ sites     No family history on file.  Past Surgical History:  Procedure Laterality Date  . FEMUR IM NAIL Left 10/06/2019   Procedure: INTRAMEDULLARY (IM) RETROGRADE FEMORAL NAIL REMOVAL ( [employer]);  Surgeon: Marybelle Killings, MD;  Location: [facility];  Service: Orthopedics;  Laterality: Left;  . FEMUR SURGERY Bilateral   . INTRAMEDULLARY (IM) NAIL INTERTROCHANTERIC Left 10/06/2019   Procedure: BIOMET AFFIXUS SHORT NAIL,  INTERTROCHANTRIC;  Surgeon: Marybelle Killings, MD;  Location: [facility];  Service: Orthopedics;  Laterality: Left;  . LEG SURGERY Left   . SHOULDER SURGERY Left    Social History   Occupational History  . Not on file  Tobacco Use  . Smoking status: Never Smoker  . Smokeless tobacco: Never Used  Substance and Sexual Activity  . Alcohol use: No  . Drug use: No  . Sexual activity: Not on file

## 2019-11-19 DIAGNOSIS — I10 Essential (primary) hypertension: Secondary | ICD-10-CM | POA: Diagnosis not present

## 2019-11-19 DIAGNOSIS — S72002A Fracture of unspecified part of neck of left femur, initial encounter for closed fracture: Secondary | ICD-10-CM | POA: Diagnosis not present

## 2019-11-19 DIAGNOSIS — R0681 Apnea, not elsewhere classified: Secondary | ICD-10-CM | POA: Diagnosis not present

## 2019-11-19 DIAGNOSIS — N401 Enlarged prostate with lower urinary tract symptoms: Secondary | ICD-10-CM | POA: Diagnosis not present

## 2019-11-27 ENCOUNTER — Telehealth: Payer: Self-pay | Admitting: Orthopaedic Surgery

## 2019-11-27 NOTE — Telephone Encounter (Signed)
Patient called stated he had a hip surgery 10/06/19 and has to have some dental work done. Was told by dentist he needed an antibiotic.  Please call patient to advise.  [phone]

## 2019-11-28 ENCOUNTER — Telehealth: Payer: Self-pay | Admitting: Orthopaedic Surgery

## 2019-11-28 NOTE — Telephone Encounter (Signed)
Duplicate message in chart.  

## 2019-11-28 NOTE — Telephone Encounter (Signed)
I called and spoke with patient. Dr. Lorin Mercy does not require premed prior to dental work. Patient will need something faxed to [facility] with this information.

## 2019-11-28 NOTE — Telephone Encounter (Signed)
Note faxed per patient request

## 2019-11-28 NOTE — Telephone Encounter (Signed)
Patient called and stated that the fax [phone] to [facility]

## 2019-11-28 NOTE — Telephone Encounter (Signed)
I called patient and advised, note has been faxed.

## 2019-11-28 NOTE — Telephone Encounter (Signed)
Patient called asking for an update about antibiotic for dental appointment. Explained to patient Dr. Lorin Mercy will reply to message as soon as time will provide. Patient states to be in severe pain and would like a call back and medication sent to pharmacy on file. Please send medication to pharmacy on file. Patient phone number is [phone].

## 2019-11-28 NOTE — Telephone Encounter (Signed)
Pt called in to check on the status of the paperwork being faxed to [facility].  [phone]

## 2019-11-29 DIAGNOSIS — R69 Illness, unspecified: Secondary | ICD-10-CM | POA: Diagnosis not present

## 2019-12-02 DIAGNOSIS — H43813 Vitreous degeneration, bilateral: Secondary | ICD-10-CM | POA: Diagnosis not present

## 2019-12-02 DIAGNOSIS — H43391 Other vitreous opacities, right eye: Secondary | ICD-10-CM | POA: Diagnosis not present

## 2019-12-02 DIAGNOSIS — H34813 Central retinal vein occlusion, bilateral, with macular edema: Secondary | ICD-10-CM | POA: Diagnosis not present

## 2019-12-02 DIAGNOSIS — H3582 Retinal ischemia: Secondary | ICD-10-CM | POA: Diagnosis not present

## 2019-12-09 DIAGNOSIS — S72122D Displaced fracture of lesser trochanter of left femur, subsequent encounter for closed fracture with routine healing: Secondary | ICD-10-CM | POA: Diagnosis not present

## 2019-12-09 DIAGNOSIS — N401 Enlarged prostate with lower urinary tract symptoms: Secondary | ICD-10-CM | POA: Diagnosis not present

## 2019-12-09 DIAGNOSIS — I10 Essential (primary) hypertension: Secondary | ICD-10-CM | POA: Diagnosis not present

## 2019-12-09 DIAGNOSIS — B356 Tinea cruris: Secondary | ICD-10-CM | POA: Diagnosis not present

## 2019-12-20 DIAGNOSIS — M7502 Adhesive capsulitis of left shoulder: Secondary | ICD-10-CM | POA: Diagnosis not present

## 2019-12-20 DIAGNOSIS — H348132 Central retinal vein occlusion, bilateral, stable: Secondary | ICD-10-CM | POA: Diagnosis not present

## 2019-12-20 DIAGNOSIS — D696 Thrombocytopenia, unspecified: Secondary | ICD-10-CM | POA: Diagnosis not present

## 2019-12-20 DIAGNOSIS — R262 Difficulty in walking, not elsewhere classified: Secondary | ICD-10-CM | POA: Diagnosis not present

## 2019-12-20 DIAGNOSIS — Z9181 History of falling: Secondary | ICD-10-CM | POA: Diagnosis not present

## 2019-12-20 DIAGNOSIS — G4730 Sleep apnea, unspecified: Secondary | ICD-10-CM | POA: Diagnosis not present

## 2019-12-20 DIAGNOSIS — C8510 Unspecified B-cell lymphoma, unspecified site: Secondary | ICD-10-CM | POA: Diagnosis not present

## 2019-12-20 DIAGNOSIS — S72002D Fracture of unspecified part of neck of left femur, subsequent encounter for closed fracture with routine healing: Secondary | ICD-10-CM | POA: Diagnosis not present

## 2019-12-20 DIAGNOSIS — N401 Enlarged prostate with lower urinary tract symptoms: Secondary | ICD-10-CM | POA: Diagnosis not present

## 2019-12-20 DIAGNOSIS — N138 Other obstructive and reflux uropathy: Secondary | ICD-10-CM | POA: Diagnosis not present

## 2019-12-20 DIAGNOSIS — I10 Essential (primary) hypertension: Secondary | ICD-10-CM | POA: Diagnosis not present

## 2019-12-20 DIAGNOSIS — E559 Vitamin D deficiency, unspecified: Secondary | ICD-10-CM | POA: Diagnosis not present

## 2019-12-23 ENCOUNTER — Ambulatory Visit (INDEPENDENT_AMBULATORY_CARE_PROVIDER_SITE_OTHER): Payer: Medicare HMO

## 2019-12-23 ENCOUNTER — Other Ambulatory Visit: Payer: Self-pay

## 2019-12-23 ENCOUNTER — Ambulatory Visit (INDEPENDENT_AMBULATORY_CARE_PROVIDER_SITE_OTHER): Payer: Medicare HMO | Admitting: Orthopaedic Surgery

## 2019-12-23 ENCOUNTER — Encounter: Payer: Self-pay | Admitting: Orthopaedic Surgery

## 2019-12-23 VITALS — BP 123/78 | HR 100 | Ht 69.0 in | Wt 190.0 lb

## 2019-12-23 DIAGNOSIS — M79605 Pain in left leg: Secondary | ICD-10-CM | POA: Diagnosis not present

## 2019-12-23 NOTE — Progress Notes (Signed)
   Post-Op Visit Note   Patient: [name] [name]           Date of Birth: [birth year]           MRN: [id number] Visit Date: 12/23/2019 PCP: [name], MD   Assessment & Plan:continue WBAT   Chief Complaint:  Chief Complaint  Patient presents with  . Left Leg - Follow-up    09/26/2019 Removal Left Femoral Nail, Placement of Left Affixus Troch Nail   Visit Diagnoses:  1. Pain in left leg     Plan: X-rays show satisfactory position.  He will progress to a cane.  He has a home therapist coming out to his house once he is using the cane appropriately he can begin walking more in the neighborhood once he walks a block or 2 can start driving and then can be progressed to working out walking in stores or progressing his distance in the neighborhood.  Follow-Up Instructions: No follow-ups on file.   Orders:  Orders Placed This Encounter  Procedures  . XR FEMUR MIN 2 VIEWS LEFT   No orders of the defined types were placed in this encounter.   Imaging: XR FEMUR MIN 2 VIEWS LEFT  Result Date: 12/23/2019 AP frog-leg x-ray left femur obtained and reviewed.  There is a healed distal third femoral shaft fracture unchanged.  Comminuted intertrochanteric hip fracture is fixed with comparison to postop images shows about 8 to 15 mm of compression screw slide.  There is interval healing medially.  No cut out of the screw head. Impression: Postop intertrochanteric nail, short fixation with interval healing.   PMFS History: Patient Active Problem List   Diagnosis Date Noted  . Closed left hip fracture ([place]) 10/04/2019  . Essential hypertension 10/04/2019  . Fall   . Port-A-Cath in place 02/04/2019  . Thrombocytopenia ([place]) 01/22/2018  . 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 History of arthroscopy of left shoulder 07/12/2016  . Port catheter in place 02/22/2016  . 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 Vitamin D deficiency 11/21/2012  . Lymphoma ([place]) 06/13/2012   Past Medical History:  Diagnosis Date  . Femur fracture ([place])   . Hip fracture ([place])   . Hypertension   . Malignant neoplasm of pancreas, part unspecified   . Other malignant lymphomas, unspecified site, extranodal and solid organ sites     No family history on file.  Past Surgical History:  Procedure Laterality Date  . FEMUR IM NAIL Left 10/06/2019   Procedure: INTRAMEDULLARY (IM) RETROGRADE FEMORAL NAIL REMOVAL ( [employer]);  Surgeon: Marybelle Killings, MD;  Location: [facility];  Service: Orthopedics;  Laterality: Left;  . FEMUR SURGERY Bilateral   . INTRAMEDULLARY (IM) NAIL INTERTROCHANTERIC Left 10/06/2019   Procedure: BIOMET AFFIXUS SHORT NAIL,  INTERTROCHANTRIC;  Surgeon: Marybelle Killings, MD;  Location: [facility];  Service: Orthopedics;  Laterality: Left;  . LEG SURGERY Left   . SHOULDER SURGERY Left    Social History   Occupational History  . Not on file  Tobacco Use  . Smoking status: Never Smoker  . Smokeless tobacco: Never Used  Substance and Sexual Activity  . Alcohol use: No  . Drug use: No  . Sexual activity: Not on file

## 2019-12-24 DIAGNOSIS — H348132 Central retinal vein occlusion, bilateral, stable: Secondary | ICD-10-CM | POA: Diagnosis not present

## 2019-12-24 DIAGNOSIS — I10 Essential (primary) hypertension: Secondary | ICD-10-CM | POA: Diagnosis not present

## 2019-12-24 DIAGNOSIS — M7502 Adhesive capsulitis of left shoulder: Secondary | ICD-10-CM | POA: Diagnosis not present

## 2019-12-24 DIAGNOSIS — N401 Enlarged prostate with lower urinary tract symptoms: Secondary | ICD-10-CM | POA: Diagnosis not present

## 2019-12-24 DIAGNOSIS — S72002D Fracture of unspecified part of neck of left femur, subsequent encounter for closed fracture with routine healing: Secondary | ICD-10-CM | POA: Diagnosis not present

## 2019-12-24 DIAGNOSIS — R262 Difficulty in walking, not elsewhere classified: Secondary | ICD-10-CM | POA: Diagnosis not present

## 2019-12-24 DIAGNOSIS — E559 Vitamin D deficiency, unspecified: Secondary | ICD-10-CM | POA: Diagnosis not present

## 2019-12-24 DIAGNOSIS — D696 Thrombocytopenia, unspecified: Secondary | ICD-10-CM | POA: Diagnosis not present

## 2019-12-24 DIAGNOSIS — N138 Other obstructive and reflux uropathy: Secondary | ICD-10-CM | POA: Diagnosis not present

## 2019-12-24 DIAGNOSIS — C8510 Unspecified B-cell lymphoma, unspecified site: Secondary | ICD-10-CM | POA: Diagnosis not present

## 2019-12-26 DIAGNOSIS — N401 Enlarged prostate with lower urinary tract symptoms: Secondary | ICD-10-CM | POA: Diagnosis not present

## 2019-12-26 DIAGNOSIS — D696 Thrombocytopenia, unspecified: Secondary | ICD-10-CM | POA: Diagnosis not present

## 2019-12-26 DIAGNOSIS — S72002D Fracture of unspecified part of neck of left femur, subsequent encounter for closed fracture with routine healing: Secondary | ICD-10-CM | POA: Diagnosis not present

## 2019-12-26 DIAGNOSIS — M7502 Adhesive capsulitis of left shoulder: Secondary | ICD-10-CM | POA: Diagnosis not present

## 2019-12-26 DIAGNOSIS — N138 Other obstructive and reflux uropathy: Secondary | ICD-10-CM | POA: Diagnosis not present

## 2019-12-26 DIAGNOSIS — I10 Essential (primary) hypertension: Secondary | ICD-10-CM | POA: Diagnosis not present

## 2019-12-26 DIAGNOSIS — E559 Vitamin D deficiency, unspecified: Secondary | ICD-10-CM | POA: Diagnosis not present

## 2019-12-26 DIAGNOSIS — H348132 Central retinal vein occlusion, bilateral, stable: Secondary | ICD-10-CM | POA: Diagnosis not present

## 2019-12-26 DIAGNOSIS — C8510 Unspecified B-cell lymphoma, unspecified site: Secondary | ICD-10-CM | POA: Diagnosis not present

## 2019-12-26 DIAGNOSIS — R262 Difficulty in walking, not elsewhere classified: Secondary | ICD-10-CM | POA: Diagnosis not present

## 2019-12-29 DIAGNOSIS — H348132 Central retinal vein occlusion, bilateral, stable: Secondary | ICD-10-CM | POA: Diagnosis not present

## 2019-12-29 DIAGNOSIS — S72002D Fracture of unspecified part of neck of left femur, subsequent encounter for closed fracture with routine healing: Secondary | ICD-10-CM | POA: Diagnosis not present

## 2019-12-29 DIAGNOSIS — D696 Thrombocytopenia, unspecified: Secondary | ICD-10-CM | POA: Diagnosis not present

## 2019-12-29 DIAGNOSIS — I10 Essential (primary) hypertension: Secondary | ICD-10-CM | POA: Diagnosis not present

## 2019-12-29 DIAGNOSIS — E559 Vitamin D deficiency, unspecified: Secondary | ICD-10-CM | POA: Diagnosis not present

## 2019-12-29 DIAGNOSIS — N401 Enlarged prostate with lower urinary tract symptoms: Secondary | ICD-10-CM | POA: Diagnosis not present

## 2019-12-29 DIAGNOSIS — R262 Difficulty in walking, not elsewhere classified: Secondary | ICD-10-CM | POA: Diagnosis not present

## 2019-12-29 DIAGNOSIS — M7502 Adhesive capsulitis of left shoulder: Secondary | ICD-10-CM | POA: Diagnosis not present

## 2019-12-29 DIAGNOSIS — N138 Other obstructive and reflux uropathy: Secondary | ICD-10-CM | POA: Diagnosis not present

## 2019-12-29 DIAGNOSIS — C8510 Unspecified B-cell lymphoma, unspecified site: Secondary | ICD-10-CM | POA: Diagnosis not present

## 2019-12-30 DIAGNOSIS — R262 Difficulty in walking, not elsewhere classified: Secondary | ICD-10-CM | POA: Diagnosis not present

## 2019-12-30 DIAGNOSIS — E559 Vitamin D deficiency, unspecified: Secondary | ICD-10-CM | POA: Diagnosis not present

## 2019-12-30 DIAGNOSIS — I10 Essential (primary) hypertension: Secondary | ICD-10-CM | POA: Diagnosis not present

## 2019-12-30 DIAGNOSIS — N138 Other obstructive and reflux uropathy: Secondary | ICD-10-CM | POA: Diagnosis not present

## 2019-12-30 DIAGNOSIS — H348132 Central retinal vein occlusion, bilateral, stable: Secondary | ICD-10-CM | POA: Diagnosis not present

## 2019-12-30 DIAGNOSIS — C8510 Unspecified B-cell lymphoma, unspecified site: Secondary | ICD-10-CM | POA: Diagnosis not present

## 2019-12-30 DIAGNOSIS — S72002D Fracture of unspecified part of neck of left femur, subsequent encounter for closed fracture with routine healing: Secondary | ICD-10-CM | POA: Diagnosis not present

## 2019-12-30 DIAGNOSIS — D696 Thrombocytopenia, unspecified: Secondary | ICD-10-CM | POA: Diagnosis not present

## 2019-12-30 DIAGNOSIS — M7502 Adhesive capsulitis of left shoulder: Secondary | ICD-10-CM | POA: Diagnosis not present

## 2019-12-30 DIAGNOSIS — N401 Enlarged prostate with lower urinary tract symptoms: Secondary | ICD-10-CM | POA: Diagnosis not present

## 2019-12-31 DIAGNOSIS — H524 Presbyopia: Secondary | ICD-10-CM | POA: Diagnosis not present

## 2019-12-31 DIAGNOSIS — N401 Enlarged prostate with lower urinary tract symptoms: Secondary | ICD-10-CM | POA: Diagnosis not present

## 2019-12-31 DIAGNOSIS — D696 Thrombocytopenia, unspecified: Secondary | ICD-10-CM | POA: Diagnosis not present

## 2019-12-31 DIAGNOSIS — H18593 Other hereditary corneal dystrophies, bilateral: Secondary | ICD-10-CM | POA: Diagnosis not present

## 2019-12-31 DIAGNOSIS — Z961 Presence of intraocular lens: Secondary | ICD-10-CM | POA: Diagnosis not present

## 2019-12-31 DIAGNOSIS — C8510 Unspecified B-cell lymphoma, unspecified site: Secondary | ICD-10-CM | POA: Diagnosis not present

## 2019-12-31 DIAGNOSIS — I10 Essential (primary) hypertension: Secondary | ICD-10-CM | POA: Diagnosis not present

## 2019-12-31 DIAGNOSIS — S72002D Fracture of unspecified part of neck of left femur, subsequent encounter for closed fracture with routine healing: Secondary | ICD-10-CM | POA: Diagnosis not present

## 2019-12-31 DIAGNOSIS — N138 Other obstructive and reflux uropathy: Secondary | ICD-10-CM | POA: Diagnosis not present

## 2019-12-31 DIAGNOSIS — R262 Difficulty in walking, not elsewhere classified: Secondary | ICD-10-CM | POA: Diagnosis not present

## 2019-12-31 DIAGNOSIS — E559 Vitamin D deficiency, unspecified: Secondary | ICD-10-CM | POA: Diagnosis not present

## 2019-12-31 DIAGNOSIS — H348132 Central retinal vein occlusion, bilateral, stable: Secondary | ICD-10-CM | POA: Diagnosis not present

## 2019-12-31 DIAGNOSIS — H26493 Other secondary cataract, bilateral: Secondary | ICD-10-CM | POA: Diagnosis not present

## 2019-12-31 DIAGNOSIS — M7502 Adhesive capsulitis of left shoulder: Secondary | ICD-10-CM | POA: Diagnosis not present

## 2020-01-05 DIAGNOSIS — R262 Difficulty in walking, not elsewhere classified: Secondary | ICD-10-CM | POA: Diagnosis not present

## 2020-01-05 DIAGNOSIS — N401 Enlarged prostate with lower urinary tract symptoms: Secondary | ICD-10-CM | POA: Diagnosis not present

## 2020-01-05 DIAGNOSIS — S72002D Fracture of unspecified part of neck of left femur, subsequent encounter for closed fracture with routine healing: Secondary | ICD-10-CM | POA: Diagnosis not present

## 2020-01-05 DIAGNOSIS — C8510 Unspecified B-cell lymphoma, unspecified site: Secondary | ICD-10-CM | POA: Diagnosis not present

## 2020-01-05 DIAGNOSIS — M7502 Adhesive capsulitis of left shoulder: Secondary | ICD-10-CM | POA: Diagnosis not present

## 2020-01-05 DIAGNOSIS — E559 Vitamin D deficiency, unspecified: Secondary | ICD-10-CM | POA: Diagnosis not present

## 2020-01-05 DIAGNOSIS — I10 Essential (primary) hypertension: Secondary | ICD-10-CM | POA: Diagnosis not present

## 2020-01-05 DIAGNOSIS — D696 Thrombocytopenia, unspecified: Secondary | ICD-10-CM | POA: Diagnosis not present

## 2020-01-05 DIAGNOSIS — H348132 Central retinal vein occlusion, bilateral, stable: Secondary | ICD-10-CM | POA: Diagnosis not present

## 2020-01-05 DIAGNOSIS — N138 Other obstructive and reflux uropathy: Secondary | ICD-10-CM | POA: Diagnosis not present

## 2020-01-06 ENCOUNTER — Encounter: Payer: Self-pay | Admitting: Dermatology

## 2020-01-06 ENCOUNTER — Ambulatory Visit: Payer: Medicare HMO | Admitting: Dermatology

## 2020-01-06 ENCOUNTER — Other Ambulatory Visit: Payer: Self-pay

## 2020-01-06 DIAGNOSIS — L570 Actinic keratosis: Secondary | ICD-10-CM | POA: Diagnosis not present

## 2020-01-06 DIAGNOSIS — R21 Rash and other nonspecific skin eruption: Secondary | ICD-10-CM

## 2020-01-06 MED ORDER — HYDROCORTISONE 2.5 % EX CREA: TOPICAL_CREAM | Freq: Every day | CUTANEOUS | 2 refills | Status: DC

## 2020-01-06 NOTE — Patient Instructions (Addendum)
Gave the patient a Good Rx card to take [facility] to [facility] just in case the pharmacy said the Hydrocortisone cream is not covered. Routine follow-up for [name] [name] with a new problem.  Breaking out for several months mostly on the sides of the face.  No prescription treatment.  ExaminGave the patient a Good Rx card to take [facility] to [facility] just in case the pharmacy said the Hydrocortisone cream is not covered. Routine follow-up for [name] [name] with a new problem.  Breaking out for several months mostly on the sides of the face.  No prescription treatment.  Examination showed a mixture of patchy dermatitis plus diffuse sun damage.  He will initially try 2-1/2% HC ointment or cream nightly for 1 month; recheck at that time.  I did explain to him that this type breaking out is fairly common in adult white man and is typically multifactorial, related to sun damage and microorganisms in the hair follicle and sebaceous gland, and sometimes adult eczema.  Additionally there is an ulcerated 1.5 cm pearly nodule on the back of his left ear.  Schedule enough time at follow-up so I can obtain a biopsy.ation showed a mixture of patchy dermatitis plus diffuse sun damage.  He will initially try 2-1/2% HC ointment or cream nightly for1 month; recheck at that time.  I did explain to him that this type breaking out is fairly common in adult white man and is typically multifactorial, related to sun damage and microorganisms in the hair follicle and sebaceous gland, and sometimes adult eczema.  Additionally there is an ulcerated 1.5 cm pearly nodule on the back of his left ear.  Schedule enough time at follow-up so I can obtain a biopsy.

## 2020-01-07 DIAGNOSIS — I10 Essential (primary) hypertension: Secondary | ICD-10-CM | POA: Diagnosis not present

## 2020-01-07 DIAGNOSIS — C8510 Unspecified B-cell lymphoma, unspecified site: Secondary | ICD-10-CM | POA: Diagnosis not present

## 2020-01-07 DIAGNOSIS — M7502 Adhesive capsulitis of left shoulder: Secondary | ICD-10-CM | POA: Diagnosis not present

## 2020-01-07 DIAGNOSIS — N401 Enlarged prostate with lower urinary tract symptoms: Secondary | ICD-10-CM | POA: Diagnosis not present

## 2020-01-07 DIAGNOSIS — N138 Other obstructive and reflux uropathy: Secondary | ICD-10-CM | POA: Diagnosis not present

## 2020-01-07 DIAGNOSIS — D696 Thrombocytopenia, unspecified: Secondary | ICD-10-CM | POA: Diagnosis not present

## 2020-01-07 DIAGNOSIS — H348132 Central retinal vein occlusion, bilateral, stable: Secondary | ICD-10-CM | POA: Diagnosis not present

## 2020-01-07 DIAGNOSIS — R262 Difficulty in walking, not elsewhere classified: Secondary | ICD-10-CM | POA: Diagnosis not present

## 2020-01-07 DIAGNOSIS — S72002D Fracture of unspecified part of neck of left femur, subsequent encounter for closed fracture with routine healing: Secondary | ICD-10-CM | POA: Diagnosis not present

## 2020-01-07 DIAGNOSIS — E559 Vitamin D deficiency, unspecified: Secondary | ICD-10-CM | POA: Diagnosis not present

## 2020-01-09 DIAGNOSIS — E559 Vitamin D deficiency, unspecified: Secondary | ICD-10-CM | POA: Diagnosis not present

## 2020-01-09 DIAGNOSIS — N401 Enlarged prostate with lower urinary tract symptoms: Secondary | ICD-10-CM | POA: Diagnosis not present

## 2020-01-09 DIAGNOSIS — R262 Difficulty in walking, not elsewhere classified: Secondary | ICD-10-CM | POA: Diagnosis not present

## 2020-01-09 DIAGNOSIS — N138 Other obstructive and reflux uropathy: Secondary | ICD-10-CM | POA: Diagnosis not present

## 2020-01-09 DIAGNOSIS — C8510 Unspecified B-cell lymphoma, unspecified site: Secondary | ICD-10-CM | POA: Diagnosis not present

## 2020-01-09 DIAGNOSIS — I10 Essential (primary) hypertension: Secondary | ICD-10-CM | POA: Diagnosis not present

## 2020-01-09 DIAGNOSIS — D696 Thrombocytopenia, unspecified: Secondary | ICD-10-CM | POA: Diagnosis not present

## 2020-01-09 DIAGNOSIS — M7502 Adhesive capsulitis of left shoulder: Secondary | ICD-10-CM | POA: Diagnosis not present

## 2020-01-09 DIAGNOSIS — S72002D Fracture of unspecified part of neck of left femur, subsequent encounter for closed fracture with routine healing: Secondary | ICD-10-CM | POA: Diagnosis not present

## 2020-01-09 DIAGNOSIS — H348132 Central retinal vein occlusion, bilateral, stable: Secondary | ICD-10-CM | POA: Diagnosis not present

## 2020-01-10 ENCOUNTER — Encounter: Payer: Self-pay | Admitting: Dermatology

## 2020-01-10 NOTE — Progress Notes (Signed)
   Follow-Up Visit   Subjective  [name] [name] is a 79 y.o. male who presents for the following: Skin Problem (Having some irritation along his right sideburn.  Been a couple of months.  Also, a couple of rough places across forehead and scalp.).  Rash  Location: Face Duration: Months Quality: Slowly spreading Associated Signs/Symptoms: Itch, sting Modifying Factors:  Severity:  Timing: Context:   The following portions of the chart were reviewed this encounter and updated as appropriate:     Objective  Well appearing patient in no apparent distress; mood and affect are within normal limits.  A focused examination was performed including Scalp, face, eyelids, lips, ears, neck . Relevant physical exam findings are noted in the Assessment and Plan. Routine follow-up for [name] [name] with a new problem.  Breaking out for several months mostly on the sides of the face.  No prescription treatment.  ExaminGave the patient a Good Rx card to take [facility] just in case the pharmacy said the Hydrocortisone cream is not covered. Routine follow-up for [name] [name] with a new problem.  Breaking out for several months mostly on the sides of the face.  No prescription treatment.  Examination showed a mixture of patchy dermatitis plus diffuse sun damage.  He will initially try 2-1/2% HC ointment or cream nightly for 1 month; recheck at that time.  I did explain to him that this type breaking out is fairly common in adult white man and is typically multifactorial, related to sun damage and microorganisms in the hair follicle and sebaceous gland, and sometimes adult eczema.  Additionally there is an ulcerated 1.5 cm pearly nodule on the back of his left ear.  Schedule enough time at follow-up so I can obtain a biopsy.ation showed a mixture of patchy dermatitis plus diffuse sun damage.  He will initially try 2-1/2% HC ointment or cream nightly for1 month; recheck at that time.  I did explain to him that this  type breaking out is fairly common in adult white man and is typically multifactorial, related to sun damage and microorganisms in the hair follicle and sebaceous gland, and sometimes adult eczema.  Additionally there is an ulcerated 1.5 cm pearly nodule on the back of his left ear.  Schedule enough time at follow-up so I can obtain a biopsy. Additionally, this is first visit that [name] [name] is using a walker.  Assessment & Plan  AK (actinic keratosis) Left Postauricular Sulcus  Rash (3) Right Temple; Left Parotid Area; Right Parotid Area  2.5% HC cream or ointment.  hydrocortisone 2.5 % cream - Right [name] [name]

## 2020-01-13 DIAGNOSIS — Z8572 Personal history of non-Hodgkin lymphomas: Secondary | ICD-10-CM | POA: Diagnosis not present

## 2020-01-13 DIAGNOSIS — S72002A Fracture of unspecified part of neck of left femur, initial encounter for closed fracture: Secondary | ICD-10-CM | POA: Diagnosis not present

## 2020-01-13 DIAGNOSIS — R269 Unspecified abnormalities of gait and mobility: Secondary | ICD-10-CM | POA: Diagnosis not present

## 2020-01-14 DIAGNOSIS — H348132 Central retinal vein occlusion, bilateral, stable: Secondary | ICD-10-CM | POA: Diagnosis not present

## 2020-01-14 DIAGNOSIS — N401 Enlarged prostate with lower urinary tract symptoms: Secondary | ICD-10-CM | POA: Diagnosis not present

## 2020-01-14 DIAGNOSIS — D696 Thrombocytopenia, unspecified: Secondary | ICD-10-CM | POA: Diagnosis not present

## 2020-01-14 DIAGNOSIS — E559 Vitamin D deficiency, unspecified: Secondary | ICD-10-CM | POA: Diagnosis not present

## 2020-01-14 DIAGNOSIS — C8510 Unspecified B-cell lymphoma, unspecified site: Secondary | ICD-10-CM | POA: Diagnosis not present

## 2020-01-14 DIAGNOSIS — R262 Difficulty in walking, not elsewhere classified: Secondary | ICD-10-CM | POA: Diagnosis not present

## 2020-01-14 DIAGNOSIS — S72002D Fracture of unspecified part of neck of left femur, subsequent encounter for closed fracture with routine healing: Secondary | ICD-10-CM | POA: Diagnosis not present

## 2020-01-14 DIAGNOSIS — I10 Essential (primary) hypertension: Secondary | ICD-10-CM | POA: Diagnosis not present

## 2020-01-14 DIAGNOSIS — M7502 Adhesive capsulitis of left shoulder: Secondary | ICD-10-CM | POA: Diagnosis not present

## 2020-01-14 DIAGNOSIS — N138 Other obstructive and reflux uropathy: Secondary | ICD-10-CM | POA: Diagnosis not present

## 2020-01-15 DIAGNOSIS — E559 Vitamin D deficiency, unspecified: Secondary | ICD-10-CM | POA: Diagnosis not present

## 2020-01-15 DIAGNOSIS — M7502 Adhesive capsulitis of left shoulder: Secondary | ICD-10-CM | POA: Diagnosis not present

## 2020-01-15 DIAGNOSIS — I10 Essential (primary) hypertension: Secondary | ICD-10-CM | POA: Diagnosis not present

## 2020-01-15 DIAGNOSIS — N401 Enlarged prostate with lower urinary tract symptoms: Secondary | ICD-10-CM | POA: Diagnosis not present

## 2020-01-15 DIAGNOSIS — D696 Thrombocytopenia, unspecified: Secondary | ICD-10-CM | POA: Diagnosis not present

## 2020-01-15 DIAGNOSIS — H348132 Central retinal vein occlusion, bilateral, stable: Secondary | ICD-10-CM | POA: Diagnosis not present

## 2020-01-15 DIAGNOSIS — N138 Other obstructive and reflux uropathy: Secondary | ICD-10-CM | POA: Diagnosis not present

## 2020-01-15 DIAGNOSIS — R262 Difficulty in walking, not elsewhere classified: Secondary | ICD-10-CM | POA: Diagnosis not present

## 2020-01-15 DIAGNOSIS — C8510 Unspecified B-cell lymphoma, unspecified site: Secondary | ICD-10-CM | POA: Diagnosis not present

## 2020-01-15 DIAGNOSIS — S72002D Fracture of unspecified part of neck of left femur, subsequent encounter for closed fracture with routine healing: Secondary | ICD-10-CM | POA: Diagnosis not present

## 2020-01-16 DIAGNOSIS — E559 Vitamin D deficiency, unspecified: Secondary | ICD-10-CM | POA: Diagnosis not present

## 2020-01-16 DIAGNOSIS — M7502 Adhesive capsulitis of left shoulder: Secondary | ICD-10-CM | POA: Diagnosis not present

## 2020-01-16 DIAGNOSIS — S72002D Fracture of unspecified part of neck of left femur, subsequent encounter for closed fracture with routine healing: Secondary | ICD-10-CM | POA: Diagnosis not present

## 2020-01-16 DIAGNOSIS — D696 Thrombocytopenia, unspecified: Secondary | ICD-10-CM | POA: Diagnosis not present

## 2020-01-16 DIAGNOSIS — R262 Difficulty in walking, not elsewhere classified: Secondary | ICD-10-CM | POA: Diagnosis not present

## 2020-01-16 DIAGNOSIS — I10 Essential (primary) hypertension: Secondary | ICD-10-CM | POA: Diagnosis not present

## 2020-01-16 DIAGNOSIS — N401 Enlarged prostate with lower urinary tract symptoms: Secondary | ICD-10-CM | POA: Diagnosis not present

## 2020-01-16 DIAGNOSIS — C8510 Unspecified B-cell lymphoma, unspecified site: Secondary | ICD-10-CM | POA: Diagnosis not present

## 2020-01-16 DIAGNOSIS — N138 Other obstructive and reflux uropathy: Secondary | ICD-10-CM | POA: Diagnosis not present

## 2020-01-16 DIAGNOSIS — H348132 Central retinal vein occlusion, bilateral, stable: Secondary | ICD-10-CM | POA: Diagnosis not present

## 2020-01-19 DIAGNOSIS — E559 Vitamin D deficiency, unspecified: Secondary | ICD-10-CM | POA: Diagnosis not present

## 2020-01-19 DIAGNOSIS — R262 Difficulty in walking, not elsewhere classified: Secondary | ICD-10-CM | POA: Diagnosis not present

## 2020-01-19 DIAGNOSIS — N138 Other obstructive and reflux uropathy: Secondary | ICD-10-CM | POA: Diagnosis not present

## 2020-01-19 DIAGNOSIS — C8510 Unspecified B-cell lymphoma, unspecified site: Secondary | ICD-10-CM | POA: Diagnosis not present

## 2020-01-19 DIAGNOSIS — I10 Essential (primary) hypertension: Secondary | ICD-10-CM | POA: Diagnosis not present

## 2020-01-19 DIAGNOSIS — M7502 Adhesive capsulitis of left shoulder: Secondary | ICD-10-CM | POA: Diagnosis not present

## 2020-01-19 DIAGNOSIS — N401 Enlarged prostate with lower urinary tract symptoms: Secondary | ICD-10-CM | POA: Diagnosis not present

## 2020-01-19 DIAGNOSIS — H348132 Central retinal vein occlusion, bilateral, stable: Secondary | ICD-10-CM | POA: Diagnosis not present

## 2020-01-19 DIAGNOSIS — D696 Thrombocytopenia, unspecified: Secondary | ICD-10-CM | POA: Diagnosis not present

## 2020-01-19 DIAGNOSIS — S72002D Fracture of unspecified part of neck of left femur, subsequent encounter for closed fracture with routine healing: Secondary | ICD-10-CM | POA: Diagnosis not present

## 2020-01-22 DIAGNOSIS — N401 Enlarged prostate with lower urinary tract symptoms: Secondary | ICD-10-CM | POA: Diagnosis not present

## 2020-01-22 DIAGNOSIS — H348132 Central retinal vein occlusion, bilateral, stable: Secondary | ICD-10-CM | POA: Diagnosis not present

## 2020-01-22 DIAGNOSIS — I10 Essential (primary) hypertension: Secondary | ICD-10-CM | POA: Diagnosis not present

## 2020-01-22 DIAGNOSIS — E559 Vitamin D deficiency, unspecified: Secondary | ICD-10-CM | POA: Diagnosis not present

## 2020-01-22 DIAGNOSIS — N138 Other obstructive and reflux uropathy: Secondary | ICD-10-CM | POA: Diagnosis not present

## 2020-01-22 DIAGNOSIS — S72002D Fracture of unspecified part of neck of left femur, subsequent encounter for closed fracture with routine healing: Secondary | ICD-10-CM | POA: Diagnosis not present

## 2020-01-22 DIAGNOSIS — C8510 Unspecified B-cell lymphoma, unspecified site: Secondary | ICD-10-CM | POA: Diagnosis not present

## 2020-01-22 DIAGNOSIS — M7502 Adhesive capsulitis of left shoulder: Secondary | ICD-10-CM | POA: Diagnosis not present

## 2020-01-22 DIAGNOSIS — R262 Difficulty in walking, not elsewhere classified: Secondary | ICD-10-CM | POA: Diagnosis not present

## 2020-01-22 DIAGNOSIS — D696 Thrombocytopenia, unspecified: Secondary | ICD-10-CM | POA: Diagnosis not present

## 2020-01-27 DIAGNOSIS — I10 Essential (primary) hypertension: Secondary | ICD-10-CM | POA: Diagnosis not present

## 2020-01-27 DIAGNOSIS — C8510 Unspecified B-cell lymphoma, unspecified site: Secondary | ICD-10-CM | POA: Diagnosis not present

## 2020-01-27 DIAGNOSIS — E559 Vitamin D deficiency, unspecified: Secondary | ICD-10-CM | POA: Diagnosis not present

## 2020-01-27 DIAGNOSIS — D696 Thrombocytopenia, unspecified: Secondary | ICD-10-CM | POA: Diagnosis not present

## 2020-01-27 DIAGNOSIS — N138 Other obstructive and reflux uropathy: Secondary | ICD-10-CM | POA: Diagnosis not present

## 2020-01-27 DIAGNOSIS — R262 Difficulty in walking, not elsewhere classified: Secondary | ICD-10-CM | POA: Diagnosis not present

## 2020-01-27 DIAGNOSIS — H348132 Central retinal vein occlusion, bilateral, stable: Secondary | ICD-10-CM | POA: Diagnosis not present

## 2020-01-27 DIAGNOSIS — S72002D Fracture of unspecified part of neck of left femur, subsequent encounter for closed fracture with routine healing: Secondary | ICD-10-CM | POA: Diagnosis not present

## 2020-01-27 DIAGNOSIS — M7502 Adhesive capsulitis of left shoulder: Secondary | ICD-10-CM | POA: Diagnosis not present

## 2020-01-27 DIAGNOSIS — N401 Enlarged prostate with lower urinary tract symptoms: Secondary | ICD-10-CM | POA: Diagnosis not present

## 2020-01-29 DIAGNOSIS — H26491 Other secondary cataract, right eye: Secondary | ICD-10-CM | POA: Diagnosis not present

## 2020-02-02 DIAGNOSIS — C8510 Unspecified B-cell lymphoma, unspecified site: Secondary | ICD-10-CM | POA: Diagnosis not present

## 2020-02-02 DIAGNOSIS — I10 Essential (primary) hypertension: Secondary | ICD-10-CM | POA: Diagnosis not present

## 2020-02-02 DIAGNOSIS — S72002D Fracture of unspecified part of neck of left femur, subsequent encounter for closed fracture with routine healing: Secondary | ICD-10-CM | POA: Diagnosis not present

## 2020-02-02 DIAGNOSIS — R262 Difficulty in walking, not elsewhere classified: Secondary | ICD-10-CM | POA: Diagnosis not present

## 2020-02-02 DIAGNOSIS — D696 Thrombocytopenia, unspecified: Secondary | ICD-10-CM | POA: Diagnosis not present

## 2020-02-02 DIAGNOSIS — M7502 Adhesive capsulitis of left shoulder: Secondary | ICD-10-CM | POA: Diagnosis not present

## 2020-02-02 DIAGNOSIS — N138 Other obstructive and reflux uropathy: Secondary | ICD-10-CM | POA: Diagnosis not present

## 2020-02-02 DIAGNOSIS — N401 Enlarged prostate with lower urinary tract symptoms: Secondary | ICD-10-CM | POA: Diagnosis not present

## 2020-02-02 DIAGNOSIS — E559 Vitamin D deficiency, unspecified: Secondary | ICD-10-CM | POA: Diagnosis not present

## 2020-02-02 DIAGNOSIS — H348132 Central retinal vein occlusion, bilateral, stable: Secondary | ICD-10-CM | POA: Diagnosis not present

## 2020-02-04 ENCOUNTER — Ambulatory Visit: Payer: Medicare HMO | Admitting: Dermatology

## 2020-02-04 ENCOUNTER — Encounter: Payer: Self-pay | Admitting: Dermatology

## 2020-02-04 ENCOUNTER — Inpatient Hospital Stay: Payer: Medicare HMO | Admitting: Internal Medicine

## 2020-02-04 ENCOUNTER — Inpatient Hospital Stay: Payer: Medicare HMO

## 2020-02-04 ENCOUNTER — Other Ambulatory Visit: Payer: Self-pay

## 2020-02-04 DIAGNOSIS — L570 Actinic keratosis: Secondary | ICD-10-CM

## 2020-02-04 DIAGNOSIS — D696 Thrombocytopenia, unspecified: Secondary | ICD-10-CM

## 2020-02-04 DIAGNOSIS — S0292XA Unspecified fracture of facial bones, initial encounter for closed fracture: Secondary | ICD-10-CM

## 2020-02-04 DIAGNOSIS — L821 Other seborrheic keratosis: Secondary | ICD-10-CM | POA: Diagnosis not present

## 2020-02-04 NOTE — Patient Instructions (Signed)
Follow-up visit for [name] [name]. Howat.  The inflammatory spot on the right temple has declared itself as a solar keratosis and did not improve with the hydrocortisone.  This spot along with a smaller spot on the left inner cheek were treated with 5-second LN2 freeze Mr. Armor can expect these places to swell and they may peel in the next 1 to 2 weeks there is no bandage or special care required the nodule behind his right lower ear is textured sharply defined brown and atypical benign keratosis unless there is clinical change we will leave the spot alone routine follow-up 1 year

## 2020-02-05 DIAGNOSIS — N138 Other obstructive and reflux uropathy: Secondary | ICD-10-CM | POA: Diagnosis not present

## 2020-02-05 DIAGNOSIS — H348132 Central retinal vein occlusion, bilateral, stable: Secondary | ICD-10-CM | POA: Diagnosis not present

## 2020-02-05 DIAGNOSIS — D696 Thrombocytopenia, unspecified: Secondary | ICD-10-CM | POA: Diagnosis not present

## 2020-02-05 DIAGNOSIS — N401 Enlarged prostate with lower urinary tract symptoms: Secondary | ICD-10-CM | POA: Diagnosis not present

## 2020-02-05 DIAGNOSIS — I10 Essential (primary) hypertension: Secondary | ICD-10-CM | POA: Diagnosis not present

## 2020-02-05 DIAGNOSIS — E559 Vitamin D deficiency, unspecified: Secondary | ICD-10-CM | POA: Diagnosis not present

## 2020-02-05 DIAGNOSIS — S72002D Fracture of unspecified part of neck of left femur, subsequent encounter for closed fracture with routine healing: Secondary | ICD-10-CM | POA: Diagnosis not present

## 2020-02-05 DIAGNOSIS — M7502 Adhesive capsulitis of left shoulder: Secondary | ICD-10-CM | POA: Diagnosis not present

## 2020-02-05 DIAGNOSIS — C8510 Unspecified B-cell lymphoma, unspecified site: Secondary | ICD-10-CM | POA: Diagnosis not present

## 2020-02-05 DIAGNOSIS — R262 Difficulty in walking, not elsewhere classified: Secondary | ICD-10-CM | POA: Diagnosis not present

## 2020-02-09 ENCOUNTER — Encounter: Payer: Self-pay | Admitting: Dermatology

## 2020-02-09 NOTE — Progress Notes (Addendum)
   Follow-Up Visit   Subjective  [name] [name] is a 79 y.o. male who presents for the following: Follow-up (One month follow-up. He feels like the place on his right temple has not improved with the HTC ointment.  Also, biopsy place on left postauricular, per last OV note there is a pearly nodule.).  Growths Location: Face Duration: Months Quality:  Associated Signs/Symptoms: Modifying Factors: Topical hydrocortisone did not help Severity:  Timing: Context:   The following portions of the chart were reviewed this encounter and updated as appropriate: Tobacco  Allergies  Meds  Problems  Med Hx  Surg Hx  Fam Hx      Objective  Well appearing patient in no apparent distress; mood and affect are within normal limits.  A focused examination was performed including Head and neck. Relevant physical exam findings are noted in the Assessment and Plan.   Assessment & Plan  Multiple facial fractures, closed, initial encounter ([place])  Thrombocytopenia (HCC)  AK (actinic keratosis) (2) Right Temple; Left Buccal Cheek   Destruction of lesion - Left Buccal Cheek , Right Temple  Destruction method: cryotherapy   Informed consent: discussed and consent obtained   Timeout:  patient name, date of birth, surgical site, and procedure verified Lesion destroyed using liquid nitrogen: Yes   Region frozen until ice ball extended beyond lesion: Yes   Cryotherapy cycles:  5 Outcome: patient tolerated procedure well with no complications    Seborrheic keratosis Right Postauricular Area  Leave if clinically stable Follow-up visit for [name] [name].  The inflammatory spot on the right temple has declared itself as a solar keratosis and did not improve with the hydrocortisone.  This spot along with a smaller spot on the left inner cheek were treated with 5-second LN2 freeze [name] [name] can expect these places to swell and they may peel in the next 1 to 2 weeks there is no bandage or special  care required the nodule behind his right lower ear is textured sharply defined brown and atypical benign keratosis unless there is clinical change we will leave the spot alone routine follow-up 1 year

## 2020-02-10 DIAGNOSIS — H3582 Retinal ischemia: Secondary | ICD-10-CM | POA: Diagnosis not present

## 2020-02-10 DIAGNOSIS — H35372 Puckering of macula, left eye: Secondary | ICD-10-CM | POA: Diagnosis not present

## 2020-02-10 DIAGNOSIS — H34813 Central retinal vein occlusion, bilateral, with macular edema: Secondary | ICD-10-CM | POA: Diagnosis not present

## 2020-02-10 DIAGNOSIS — H31093 Other chorioretinal scars, bilateral: Secondary | ICD-10-CM | POA: Diagnosis not present

## 2020-02-12 ENCOUNTER — Inpatient Hospital Stay: Payer: Medicare HMO

## 2020-02-12 ENCOUNTER — Inpatient Hospital Stay: Payer: Medicare HMO | Attending: Internal Medicine

## 2020-02-12 ENCOUNTER — Other Ambulatory Visit: Payer: Self-pay

## 2020-02-12 ENCOUNTER — Inpatient Hospital Stay: Payer: Medicare HMO | Admitting: Internal Medicine

## 2020-02-12 ENCOUNTER — Encounter: Payer: Self-pay | Admitting: Internal Medicine

## 2020-02-12 VITALS — BP 164/99 | HR 100 | Temp 98.1°F | Resp 20 | Ht 69.0 in | Wt 175.7 lb

## 2020-02-12 DIAGNOSIS — Z9221 Personal history of antineoplastic chemotherapy: Secondary | ICD-10-CM | POA: Diagnosis not present

## 2020-02-12 DIAGNOSIS — Z85828 Personal history of other malignant neoplasm of skin: Secondary | ICD-10-CM | POA: Insufficient documentation

## 2020-02-12 DIAGNOSIS — C8251 Diffuse follicle center lymphoma, lymph nodes of head, face, and neck: Secondary | ICD-10-CM

## 2020-02-12 DIAGNOSIS — Z8572 Personal history of non-Hodgkin lymphomas: Secondary | ICD-10-CM | POA: Diagnosis present

## 2020-02-12 DIAGNOSIS — I10 Essential (primary) hypertension: Secondary | ICD-10-CM | POA: Diagnosis not present

## 2020-02-12 DIAGNOSIS — Z79899 Other long term (current) drug therapy: Secondary | ICD-10-CM | POA: Insufficient documentation

## 2020-02-12 DIAGNOSIS — Z95828 Presence of other vascular implants and grafts: Secondary | ICD-10-CM

## 2020-02-12 LAB — CBC WITH DIFFERENTIAL (CANCER CENTER ONLY)
Abs Immature Granulocytes: 0.02 10*3/uL (ref 0.00–0.07)
Basophils Absolute: 0.1 10*3/uL (ref 0.0–0.1)
Basophils Relative: 1 %
Eosinophils Absolute: 0.2 10*3/uL (ref 0.0–0.5)
Eosinophils Relative: 2 %
HCT: 45.6 % (ref 39.0–52.0)
Hemoglobin: 14.5 g/dL (ref 13.0–17.0)
Immature Granulocytes: 0 %
Lymphocytes Relative: 25 %
Lymphs Abs: 1.8 10*3/uL (ref 0.7–4.0)
MCH: 27.4 pg (ref 26.0–34.0)
MCHC: 31.8 g/dL (ref 30.0–36.0)
MCV: 86.2 fL (ref 80.0–100.0)
Monocytes Absolute: 0.5 10*3/uL (ref 0.1–1.0)
Monocytes Relative: 6 %
Neutro Abs: 4.7 10*3/uL (ref 1.7–7.7)
Neutrophils Relative %: 66 %
Platelet Count: 150 10*3/uL (ref 150–400)
RBC: 5.29 MIL/uL (ref 4.22–5.81)
RDW: 14.7 % (ref 11.5–15.5)
WBC Count: 7.2 10*3/uL (ref 4.0–10.5)
nRBC: 0 % (ref 0.0–0.2)

## 2020-02-12 LAB — CMP (CANCER CENTER ONLY)
ALT: 18 U/L (ref 0–44)
AST: 22 U/L (ref 15–41)
Albumin: 4.4 g/dL (ref 3.5–5.0)
Alkaline Phosphatase: 79 U/L (ref 38–126)
Anion gap: 11 (ref 5–15)
BUN: 13 mg/dL (ref 8–23)
CO2: 25 mmol/L (ref 22–32)
Calcium: 9.3 mg/dL (ref 8.9–10.3)
Chloride: 105 mmol/L (ref 98–111)
Creatinine: 1.03 mg/dL (ref 0.61–1.24)
GFR, Est AFR Am: >60 mL/min (ref >60)
GFR, Estimated: >60 mL/min (ref >60)
Glucose, Bld: 103 mg/dL — ABNORMAL HIGH (ref 70–99)
Potassium: 4.2 mmol/L (ref 3.5–5.1)
Sodium: 141 mmol/L (ref 135–145)
Total Bilirubin: 1 mg/dL (ref 0.3–1.2)
Total Protein: 7.3 g/dL (ref 6.5–8.1)

## 2020-02-12 LAB — VITAMIN D 25 HYDROXY (VIT D DEFICIENCY, FRACTURES): Vit D, 25-Hydroxy: 77.27 ng/mL (ref 30–100)

## 2020-02-12 MED ORDER — HEPARIN SOD (PORK) LOCK FLUSH 100 UNIT/ML IV SOLN
500.0000 [IU] | Freq: Once | INTRAVENOUS | Status: AC
  Administered 2020-02-12: 500 [IU]
  Filled 2020-02-12: qty 5

## 2020-02-12 MED ORDER — SODIUM CHLORIDE 0.9% FLUSH
10.0000 mL | Freq: Once | INTRAVENOUS | Status: AC
  Administered 2020-02-12: 10 mL
  Filled 2020-02-12: qty 10

## 2020-02-12 NOTE — Progress Notes (Signed)
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  CURRENT THERAPY: Observation.   INTERVAL HISTORY: [name] [name] 79 y.o. male returns to the clinic today for 6 months follow-up visit.  The patient is feeling fine today with no concerning complaints.  He has fracture of the left hip in January 2021.  He is recovering well and using a walker at this point.  He denied having any current chest pain, shortness of breath, cough or hemoptysis.  He denied having any fever or chills.  He has no nausea, vomiting, diarrhea or constipation.  He denied having any headache or visual changes.  He is here today for evaluation and repeat blood work.  MEDICAL HISTORY: Past Medical History:  Diagnosis Date  . Basal cell carcinoma 06/08/1998   Upper lip - CX3+5FU  . Basal cell carcinoma 06/22/1998   Left front neck - CX3+5FU  . Basal cell carcinoma 03/11/2004   Right temple [facility])  . Basal cell carcinoma 04/24/2016   Left top shoulder - Clear  . Basal cell carcinoma 01/14/2018   Right sideburn - CX3+5FU  . Femur fracture ([place])   . Hip fracture ([place])   . Hypertension   . Malignant neoplasm of pancreas, part unspecified   . Other malignant lymphomas, unspecified site, extranodal and solid organ sites   . Squamous cell carcinoma of skin 09/26/2001   Left forehead - CX3+excision  . Squamous cell carcinoma of skin 03/11/2004   Post right scalp - CX3+5FU  . Squamous cell carcinoma of skin 03/11/2004   Left forehead (MOHs)  .  Squamous cell carcinoma of skin 03/11/2004   Right sideburn - CX3+5FU+excision  . Squamous cell carcinoma of skin 12/01/2004   Left temple - CX3+excision  . Squamous cell carcinoma of skin 04/13/2006   Mid scalp, sup - tx p bx  . Squamous cell carcinoma of skin 04/13/2006   Mid scalp, inf - tx p bx  . Squamous cell carcinoma of skin 04/13/2006   Left temple - tx p bx  . Squamous cell carcinoma of skin 06/30/2010   Left low back - tx p bx  . Squamous cell carcinoma of skin 06/23/2013   Front scalp - CX3+5FU  . Squamous cell carcinoma of skin 01/14/2018   Left scalp - CX3+5FU    ALLERGIES:  is allergic to morphine and related; iohexol; and sulfa antibiotics.  MEDICATIONS:  Current Outpatient Medications  Medication Sig Dispense Refill  . Aflibercept (EYLEA) 2 MG/0.05ML SOLN Inject 0.5 mLs into the eye as directed. Take every 6 wks in eye doctor office.     . Ascorbic Acid (VITAMIN C) 1000 MG tablet Take 10,000 mg by mouth daily.     . cholecalciferol (VITAMIN D) 1000 UNITS tablet Take 15,000 Units by mouth daily.     Marland Kitchen ketoconazole (NIZORAL) 2 % cream APPLY TOPICALLY ONCE DAILY FOR 14 DAYS    . lisinopril (ZESTRIL) 20 MG tablet Take 20 mg by mouth daily.    . magnesium oxide (MAG-OX) 400 MG tablet Take 400 mg by mouth daily.    Marland Kitchen  Multiple Vitamin (MULTIVITAMIN) tablet Take 6 tablets by mouth daily.     . Probiotic Product (PROBIOTIC FORMULA PO) Take 1 tablet by mouth daily.      . tamsulosin (FLOMAX) 0.4 MG CAPS capsule Take 0.4 mg by mouth daily.    . Turmeric (QC TUMERIC COMPLEX PO) Take 1 tablet by mouth daily.    . Zinc 30 MG TABS Take 1 tablet by mouth every morning.     No current facility-administered medications for this visit.   Facility-Administered Medications Ordered in Other Visits  Medication Dose Route Frequency Provider Last Rate Last Admin  . heparin lock flush 100 unit/mL  500 Units Intravenous Once PRN Curt Bears, MD      . sodium chloride 0.9 %  injection 10 mL  10 mL Intravenous PRN Curt Bears, MD   10 mL at 12/29/14 1026  . sodium chloride 0.9 % injection 10 mL  10 mL Intravenous PRN Curt Bears, MD        SURGICAL HISTORY:  Past Surgical History:  Procedure Laterality Date  . FEMUR IM NAIL Left 10/06/2019   Procedure: INTRAMEDULLARY (IM) RETROGRADE FEMORAL NAIL REMOVAL ( [employer]);  Surgeon: Marybelle Killings, MD;  Location: [facility];  Service: Orthopedics;  Laterality: Left;  . FEMUR SURGERY Bilateral   . INTRAMEDULLARY (IM) NAIL INTERTROCHANTERIC Left 10/06/2019   Procedure: BIOMET AFFIXUS SHORT NAIL,  INTERTROCHANTRIC;  Surgeon: Marybelle Killings, MD;  Location: [facility];  Service: Orthopedics;  Laterality: Left;  . LEG SURGERY Left   . SHOULDER SURGERY Left     REVIEW OF SYSTEMS:  A comprehensive review of systems was negative except for: Constitutional: positive for fatigue   PHYSICAL EXAMINATION: General appearance: alert, cooperative and no distress Head: Normocephalic, without obvious abnormality, atraumatic Neck: no adenopathy, no JVD, supple, symmetrical, trachea midline and thyroid not enlarged, symmetric, no tenderness/mass/nodules Lymph nodes: Cervical, supraclavicular, and axillary nodes normal. Resp: clear to auscultation bilaterally Back: symmetric, no curvature. ROM normal. No CVA tenderness. Cardio: regular rate and rhythm, S1, S2 normal, no murmur, click, rub or gallop GI: soft, non-tender; bowel sounds normal; no masses,  no organomegaly Extremities: extremities normal, atraumatic, no cyanosis or edema  ECOG PERFORMANCE STATUS: 0 - Asymptomatic  Blood pressure (!) 164/99, pulse 100, temperature 98.1 F (36.7 C), temperature source Temporal, resp. rate 20, height 5\' 9"  (1.753 m), weight 175 lb 11.2 oz (79.7 kg), SpO2 98 %.  LABORATORY DATA: Lab Results  Component Value Date   WBC 7.3 10/09/2019   HGB 10.0 (L) 10/09/2019   HCT 30.5 (L) 10/09/2019   MCV 87.1 10/09/2019   PLT 177 10/09/2019       Chemistry      Component Value Date/Time   NA 138 10/09/2019 0242   NA 140 01/23/2017 0958   K 3.5 10/09/2019 0242   K 4.0 01/23/2017 0958   CL 102 10/09/2019 0242   CL 103 11/21/2012 0930   CO2 25 10/09/2019 0242   CO2 28 01/23/2017 0958   BUN 20 10/09/2019 0242   BUN 17.0 01/23/2017 0958   CREATININE 1.14 10/09/2019 0242   CREATININE 1.28 (H) 02/04/2019 1050   CREATININE 1.3 01/23/2017 0958      Component Value Date/Time   CALCIUM 8.7 (L) 10/09/2019 0242   CALCIUM 10.2 01/23/2017 0958   ALKPHOS 56 10/09/2019 0242   ALKPHOS 61 01/23/2017 0958   AST 27 10/09/2019 0242   AST 20 02/04/2019 1050   AST 20 01/23/2017 [id number]  ALT 21 10/09/2019 0242   ALT 18 02/04/2019 1050   ALT 21 01/23/2017 0958   BILITOT 1.3 (H) 10/09/2019 0242   BILITOT 0.9 02/04/2019 1050   BILITOT 1.14 01/23/2017 0958       RADIOGRAPHIC STUDIES: No results found.  ASSESSMENT AND PLAN:  This is a very pleasant 79 years old white male with history of large B-cell non-Hodgkin lymphoma presented with a large mass close to the head of the pancreas status post systemic chemotherapy with CHOP/Rituxan for 4 cycles in addition to hyperthermic therapy in [place] with almost complete response of his disease. The patient is currently on observation and he is doing fine with no concerning complaints.  His lab work is still pending. I recommended for him to continue on observation with repeat blood work in 6 months. He will have Port-A-Cath flush every 2 months. The patient was advised to call immediately if he has any other concerning symptoms in the interval. The patient voices understanding of current disease status and treatment options and is in agreement with the current care plan. All questions were answered. The patient knows to call the clinic with any problems, questions or concerns. We can certainly see the patient much sooner if necessary.  Disclaimer: This note was dictated with voice recognition  software. Similar sounding words can inadvertently be transcribed and may not be corrected upon review.

## 2020-02-13 DIAGNOSIS — C8510 Unspecified B-cell lymphoma, unspecified site: Secondary | ICD-10-CM | POA: Diagnosis not present

## 2020-02-13 DIAGNOSIS — I10 Essential (primary) hypertension: Secondary | ICD-10-CM | POA: Diagnosis not present

## 2020-02-13 DIAGNOSIS — N401 Enlarged prostate with lower urinary tract symptoms: Secondary | ICD-10-CM | POA: Diagnosis not present

## 2020-02-13 DIAGNOSIS — N138 Other obstructive and reflux uropathy: Secondary | ICD-10-CM | POA: Diagnosis not present

## 2020-02-13 DIAGNOSIS — D696 Thrombocytopenia, unspecified: Secondary | ICD-10-CM | POA: Diagnosis not present

## 2020-02-13 DIAGNOSIS — M7502 Adhesive capsulitis of left shoulder: Secondary | ICD-10-CM | POA: Diagnosis not present

## 2020-02-13 DIAGNOSIS — E559 Vitamin D deficiency, unspecified: Secondary | ICD-10-CM | POA: Diagnosis not present

## 2020-02-13 DIAGNOSIS — H348132 Central retinal vein occlusion, bilateral, stable: Secondary | ICD-10-CM | POA: Diagnosis not present

## 2020-02-13 DIAGNOSIS — S72002D Fracture of unspecified part of neck of left femur, subsequent encounter for closed fracture with routine healing: Secondary | ICD-10-CM | POA: Diagnosis not present

## 2020-02-13 DIAGNOSIS — R262 Difficulty in walking, not elsewhere classified: Secondary | ICD-10-CM | POA: Diagnosis not present

## 2020-02-13 LAB — LACTATE DEHYDROGENASE: LDH: 186 U/L (ref 98–192)

## 2020-02-13 LAB — CALCITRIOL (1,25 DI-OH VIT D): Vit D, 1,25-Dihydroxy: 65.7 pg/mL (ref 19.9–79.3)

## 2020-02-17 ENCOUNTER — Ambulatory Visit (INDEPENDENT_AMBULATORY_CARE_PROVIDER_SITE_OTHER): Payer: Medicare HMO

## 2020-02-17 ENCOUNTER — Ambulatory Visit: Payer: Medicare HMO | Admitting: Orthopaedic Surgery

## 2020-02-17 ENCOUNTER — Encounter: Payer: Self-pay | Admitting: Orthopaedic Surgery

## 2020-02-17 ENCOUNTER — Other Ambulatory Visit: Payer: Self-pay

## 2020-02-17 VITALS — Ht 69.0 in | Wt 190.0 lb

## 2020-02-17 DIAGNOSIS — S72002S Fracture of unspecified part of neck of left femur, sequela: Secondary | ICD-10-CM

## 2020-02-17 DIAGNOSIS — M79605 Pain in left leg: Secondary | ICD-10-CM

## 2020-02-17 NOTE — Progress Notes (Signed)
Office Visit Note   Patient: [name] [name]           Date of Birth: 1941-04-18           MRN: [id number] Visit Date: 02/17/2020              Requested by: [name], MD [place],  [place] [id number] PCP: [name], MD   Assessment & Plan: Visit Diagnoses:  1. Pain in left leg   2. Closed fracture of left hip, sequela     Plan: Patient will continue to ambulate with a cane work on bilateral lower extremity strengthening.  X-rays show satisfactory interval healing with no evidence of further compression and hardware is intact.  He can follow-up with me on an as-needed basis.  Follow-Up Instructions: Return if symptoms worsen or fail to improve.   Orders:  Orders Placed This Encounter  Procedures  . XR HIP UNILAT W OR W/O PELVIS 2-3 VIEWS LEFT   No orders of the defined types were placed in this encounter.     Procedures: No procedures performed   Clinical Data: No additional findings.   Subjective: Chief Complaint  Patient presents with  . Left Hip - Follow-up    HPI 79 year old male with history of lymphoma and femur fractures underwent truck nail January 2021 by me with left short trip nail with derotational screw.  Patient also has a pancreatic masses which is being followed on PET scan and previous biopsy was negative for malignancy.  Patient had to have retrograde nail removed from the left side in order to have the truck nail placed.  He is just started weightbearing as tolerated using a cane in the opposite right hand and states he is not having any pain but still has some stiffness and takes and multiple steps just work his way up to normal gait speed and having gradual decrease symptoms.  Review of Systems updated unchanged from 10/05/2019 consultation note by me.   Objective: Vital Signs: Ht 5\' 9"  (1.753 m)   Wt 190 lb (86.2 kg)   BMI 28.06 kg/m   Physical Exam Constitutional:      Appearance: He is well-developed.    HENT:     Head: Normocephalic and atraumatic.  Eyes:     Pupils: Pupils are equal, round, and reactive to light.  Neck:     Thyroid: No thyromegaly.     Trachea: No tracheal deviation.  Cardiovascular:     Rate and Rhythm: Normal rate.  Pulmonary:     Effort: Pulmonary effort is normal.     Breath sounds: No wheezing.  Abdominal:     General: Bowel sounds are normal.     Palpations: Abdomen is soft.  Skin:    General: Skin is warm and dry.     Capillary Refill: Capillary refill takes less than 2 seconds.  Neurological:     Mental Status: He is alert and oriented to person, place, and time.  Psychiatric:        Behavior: Behavior normal.        Thought Content: Thought content normal.        Judgment: Judgment normal.     Ortho Exam patient is amatory he has a buildup in his left shoe.  He is ambulatory with a cane in the right hand no significant pain with hip range of motion on the left or right.  Specialty Comments:  No specialty comments available.  Imaging: XR  HIP UNILAT W OR W/O PELVIS 2-3 VIEWS LEFT  Result Date: 02/17/2020 AP frog-leg lateral left hip obtained and reviewed.  This shows trochanteric short nail fixation with derotational screw with satisfactory position and interval healing. Impression: Satisfactory post troches nail fixation for comminuted intertrochanteric hip fracture.    PMFS History: Patient Active Problem List   Diagnosis Date Noted  . Multiple facial fractures, closed, initial encounter ([place]) 02/04/2020  . Closed left hip fracture ([place]) 10/04/2019  . Essential hypertension 10/04/2019  . Fall   . Port-A-Cath in place 02/04/2019  . Thrombocytopenia ([place]) 01/22/2018  . 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 History of arthroscopy of left shoulder 07/12/2016  . Port catheter in place 02/22/2016  . 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  Vitamin D deficiency 11/21/2012  . Lymphoma ([place]) 06/13/2012   Past Medical History:  Diagnosis Date  . Basal cell carcinoma 06/08/1998   Upper lip - CX3+5FU  . Basal cell carcinoma 06/22/1998   Left front neck - CX3+5FU  . Basal cell carcinoma 03/11/2004   Right temple [facility])  . Basal cell carcinoma 04/24/2016   Left top shoulder - Clear  . Basal cell carcinoma 01/14/2018   Right sideburn - CX3+5FU  . Femur fracture ([place])   . Hip fracture ([place])   . Hypertension   . Malignant neoplasm of pancreas, part unspecified   . Other malignant lymphomas, unspecified site, extranodal and solid organ sites   . Squamous cell carcinoma of skin 09/26/2001   Left forehead - CX3+excision  . Squamous cell carcinoma of skin 03/11/2004   Post right scalp - CX3+5FU  . Squamous cell carcinoma of skin 03/11/2004   Left forehead ([facility])  . Squamous cell carcinoma of skin 03/11/2004   Right sideburn - CX3+5FU+excision  . Squamous cell carcinoma of skin 12/01/2004   Left temple - CX3+excision  . Squamous cell carcinoma of skin 04/13/2006   Mid scalp, sup - tx p bx  . Squamous cell carcinoma of skin 04/13/2006   Mid scalp, inf - tx p bx  . Squamous cell carcinoma of skin 04/13/2006   Left temple - tx p bx  . Squamous cell carcinoma of skin 06/30/2010   Left low back - tx p bx  . Squamous cell carcinoma of skin 06/23/2013   Front scalp - CX3+5FU  . Squamous cell carcinoma of skin 01/14/2018   Left scalp - CX3+5FU    No family history on file.  Past Surgical History:  Procedure Laterality Date  . FEMUR IM NAIL Left 10/06/2019   Procedure: INTRAMEDULLARY (IM) RETROGRADE FEMORAL NAIL REMOVAL ( [employer]);  Surgeon: Marybelle Killings, MD;  Location: [facility];  Service: Orthopedics;  Laterality: Left;  . FEMUR SURGERY Bilateral   . INTRAMEDULLARY (IM) NAIL INTERTROCHANTERIC Left 10/06/2019   Procedure: BIOMET AFFIXUS SHORT NAIL,  INTERTROCHANTRIC;  Surgeon: Marybelle Killings, MD;  Location: [facility];  Service:  Orthopedics;  Laterality: Left;  . LEG SURGERY Left   . SHOULDER SURGERY Left    Social History   Occupational History  . Not on file  Tobacco Use  . Smoking status: Never Smoker  . Smokeless tobacco: Never Used  Substance and Sexual Activity  . Alcohol use: No  . Drug use: No  . Sexual activity: Not on file

## 2020-02-18 DIAGNOSIS — H348132 Central retinal vein occlusion, bilateral, stable: Secondary | ICD-10-CM | POA: Diagnosis not present

## 2020-02-18 DIAGNOSIS — N401 Enlarged prostate with lower urinary tract symptoms: Secondary | ICD-10-CM | POA: Diagnosis not present

## 2020-02-18 DIAGNOSIS — E559 Vitamin D deficiency, unspecified: Secondary | ICD-10-CM | POA: Diagnosis not present

## 2020-02-18 DIAGNOSIS — N138 Other obstructive and reflux uropathy: Secondary | ICD-10-CM | POA: Diagnosis not present

## 2020-02-18 DIAGNOSIS — Z9181 History of falling: Secondary | ICD-10-CM | POA: Diagnosis not present

## 2020-02-18 DIAGNOSIS — M7502 Adhesive capsulitis of left shoulder: Secondary | ICD-10-CM | POA: Diagnosis not present

## 2020-02-18 DIAGNOSIS — C8510 Unspecified B-cell lymphoma, unspecified site: Secondary | ICD-10-CM | POA: Diagnosis not present

## 2020-02-18 DIAGNOSIS — I10 Essential (primary) hypertension: Secondary | ICD-10-CM | POA: Diagnosis not present

## 2020-02-18 DIAGNOSIS — D696 Thrombocytopenia, unspecified: Secondary | ICD-10-CM | POA: Diagnosis not present

## 2020-02-18 DIAGNOSIS — G4730 Sleep apnea, unspecified: Secondary | ICD-10-CM | POA: Diagnosis not present

## 2020-02-19 DIAGNOSIS — D696 Thrombocytopenia, unspecified: Secondary | ICD-10-CM | POA: Diagnosis not present

## 2020-02-19 DIAGNOSIS — E559 Vitamin D deficiency, unspecified: Secondary | ICD-10-CM | POA: Diagnosis not present

## 2020-02-19 DIAGNOSIS — N401 Enlarged prostate with lower urinary tract symptoms: Secondary | ICD-10-CM | POA: Diagnosis not present

## 2020-02-19 DIAGNOSIS — G4730 Sleep apnea, unspecified: Secondary | ICD-10-CM | POA: Diagnosis not present

## 2020-02-19 DIAGNOSIS — C8510 Unspecified B-cell lymphoma, unspecified site: Secondary | ICD-10-CM | POA: Diagnosis not present

## 2020-02-19 DIAGNOSIS — M7502 Adhesive capsulitis of left shoulder: Secondary | ICD-10-CM | POA: Diagnosis not present

## 2020-02-19 DIAGNOSIS — H348132 Central retinal vein occlusion, bilateral, stable: Secondary | ICD-10-CM | POA: Diagnosis not present

## 2020-02-19 DIAGNOSIS — N138 Other obstructive and reflux uropathy: Secondary | ICD-10-CM | POA: Diagnosis not present

## 2020-02-19 DIAGNOSIS — S72002D Fracture of unspecified part of neck of left femur, subsequent encounter for closed fracture with routine healing: Secondary | ICD-10-CM | POA: Diagnosis not present

## 2020-02-19 DIAGNOSIS — I10 Essential (primary) hypertension: Secondary | ICD-10-CM | POA: Diagnosis not present

## 2020-02-20 DIAGNOSIS — C8510 Unspecified B-cell lymphoma, unspecified site: Secondary | ICD-10-CM | POA: Diagnosis not present

## 2020-02-20 DIAGNOSIS — S72002D Fracture of unspecified part of neck of left femur, subsequent encounter for closed fracture with routine healing: Secondary | ICD-10-CM | POA: Diagnosis not present

## 2020-02-20 DIAGNOSIS — N138 Other obstructive and reflux uropathy: Secondary | ICD-10-CM | POA: Diagnosis not present

## 2020-02-20 DIAGNOSIS — I10 Essential (primary) hypertension: Secondary | ICD-10-CM | POA: Diagnosis not present

## 2020-02-20 DIAGNOSIS — G4730 Sleep apnea, unspecified: Secondary | ICD-10-CM | POA: Diagnosis not present

## 2020-02-20 DIAGNOSIS — D696 Thrombocytopenia, unspecified: Secondary | ICD-10-CM | POA: Diagnosis not present

## 2020-02-20 DIAGNOSIS — N401 Enlarged prostate with lower urinary tract symptoms: Secondary | ICD-10-CM | POA: Diagnosis not present

## 2020-02-20 DIAGNOSIS — M7502 Adhesive capsulitis of left shoulder: Secondary | ICD-10-CM | POA: Diagnosis not present

## 2020-02-20 DIAGNOSIS — H348132 Central retinal vein occlusion, bilateral, stable: Secondary | ICD-10-CM | POA: Diagnosis not present

## 2020-02-20 DIAGNOSIS — E559 Vitamin D deficiency, unspecified: Secondary | ICD-10-CM | POA: Diagnosis not present

## 2020-02-24 ENCOUNTER — Telehealth: Payer: Self-pay | Admitting: Internal Medicine

## 2020-02-24 DIAGNOSIS — I10 Essential (primary) hypertension: Secondary | ICD-10-CM | POA: Diagnosis not present

## 2020-02-24 DIAGNOSIS — H348132 Central retinal vein occlusion, bilateral, stable: Secondary | ICD-10-CM | POA: Diagnosis not present

## 2020-02-24 DIAGNOSIS — S72002D Fracture of unspecified part of neck of left femur, subsequent encounter for closed fracture with routine healing: Secondary | ICD-10-CM | POA: Diagnosis not present

## 2020-02-24 DIAGNOSIS — N138 Other obstructive and reflux uropathy: Secondary | ICD-10-CM | POA: Diagnosis not present

## 2020-02-24 DIAGNOSIS — E559 Vitamin D deficiency, unspecified: Secondary | ICD-10-CM | POA: Diagnosis not present

## 2020-02-24 DIAGNOSIS — M7502 Adhesive capsulitis of left shoulder: Secondary | ICD-10-CM | POA: Diagnosis not present

## 2020-02-24 DIAGNOSIS — N401 Enlarged prostate with lower urinary tract symptoms: Secondary | ICD-10-CM | POA: Diagnosis not present

## 2020-02-24 DIAGNOSIS — G4730 Sleep apnea, unspecified: Secondary | ICD-10-CM | POA: Diagnosis not present

## 2020-02-24 DIAGNOSIS — C8510 Unspecified B-cell lymphoma, unspecified site: Secondary | ICD-10-CM | POA: Diagnosis not present

## 2020-02-24 DIAGNOSIS — D696 Thrombocytopenia, unspecified: Secondary | ICD-10-CM | POA: Diagnosis not present

## 2020-02-24 NOTE — Telephone Encounter (Signed)
Scheduled per los. Called and spoke with patient. Confirmed appt 

## 2020-02-27 DIAGNOSIS — D696 Thrombocytopenia, unspecified: Secondary | ICD-10-CM | POA: Diagnosis not present

## 2020-02-27 DIAGNOSIS — I10 Essential (primary) hypertension: Secondary | ICD-10-CM | POA: Diagnosis not present

## 2020-02-27 DIAGNOSIS — N138 Other obstructive and reflux uropathy: Secondary | ICD-10-CM | POA: Diagnosis not present

## 2020-02-27 DIAGNOSIS — E559 Vitamin D deficiency, unspecified: Secondary | ICD-10-CM | POA: Diagnosis not present

## 2020-02-27 DIAGNOSIS — N401 Enlarged prostate with lower urinary tract symptoms: Secondary | ICD-10-CM | POA: Diagnosis not present

## 2020-02-27 DIAGNOSIS — S72002D Fracture of unspecified part of neck of left femur, subsequent encounter for closed fracture with routine healing: Secondary | ICD-10-CM | POA: Diagnosis not present

## 2020-02-27 DIAGNOSIS — H348132 Central retinal vein occlusion, bilateral, stable: Secondary | ICD-10-CM | POA: Diagnosis not present

## 2020-02-27 DIAGNOSIS — G4730 Sleep apnea, unspecified: Secondary | ICD-10-CM | POA: Diagnosis not present

## 2020-02-27 DIAGNOSIS — M7502 Adhesive capsulitis of left shoulder: Secondary | ICD-10-CM | POA: Diagnosis not present

## 2020-02-27 DIAGNOSIS — C8510 Unspecified B-cell lymphoma, unspecified site: Secondary | ICD-10-CM | POA: Diagnosis not present

## 2020-03-03 DIAGNOSIS — E559 Vitamin D deficiency, unspecified: Secondary | ICD-10-CM | POA: Diagnosis not present

## 2020-03-03 DIAGNOSIS — E1159 Type 2 diabetes mellitus with other circulatory complications: Secondary | ICD-10-CM | POA: Diagnosis not present

## 2020-03-03 DIAGNOSIS — I10 Essential (primary) hypertension: Secondary | ICD-10-CM | POA: Diagnosis not present

## 2020-03-05 DIAGNOSIS — N401 Enlarged prostate with lower urinary tract symptoms: Secondary | ICD-10-CM | POA: Diagnosis not present

## 2020-03-05 DIAGNOSIS — I10 Essential (primary) hypertension: Secondary | ICD-10-CM | POA: Diagnosis not present

## 2020-03-05 DIAGNOSIS — S72002D Fracture of unspecified part of neck of left femur, subsequent encounter for closed fracture with routine healing: Secondary | ICD-10-CM | POA: Diagnosis not present

## 2020-03-05 DIAGNOSIS — C8510 Unspecified B-cell lymphoma, unspecified site: Secondary | ICD-10-CM | POA: Diagnosis not present

## 2020-03-05 DIAGNOSIS — G4730 Sleep apnea, unspecified: Secondary | ICD-10-CM | POA: Diagnosis not present

## 2020-03-05 DIAGNOSIS — D696 Thrombocytopenia, unspecified: Secondary | ICD-10-CM | POA: Diagnosis not present

## 2020-03-05 DIAGNOSIS — H348132 Central retinal vein occlusion, bilateral, stable: Secondary | ICD-10-CM | POA: Diagnosis not present

## 2020-03-05 DIAGNOSIS — M7502 Adhesive capsulitis of left shoulder: Secondary | ICD-10-CM | POA: Diagnosis not present

## 2020-03-05 DIAGNOSIS — E559 Vitamin D deficiency, unspecified: Secondary | ICD-10-CM | POA: Diagnosis not present

## 2020-03-05 DIAGNOSIS — N138 Other obstructive and reflux uropathy: Secondary | ICD-10-CM | POA: Diagnosis not present

## 2020-03-11 DIAGNOSIS — E7800 Pure hypercholesterolemia, unspecified: Secondary | ICD-10-CM | POA: Diagnosis not present

## 2020-03-11 DIAGNOSIS — E559 Vitamin D deficiency, unspecified: Secondary | ICD-10-CM | POA: Diagnosis not present

## 2020-03-11 DIAGNOSIS — E1159 Type 2 diabetes mellitus with other circulatory complications: Secondary | ICD-10-CM | POA: Diagnosis not present

## 2020-03-11 DIAGNOSIS — N401 Enlarged prostate with lower urinary tract symptoms: Secondary | ICD-10-CM | POA: Diagnosis not present

## 2020-03-11 DIAGNOSIS — D696 Thrombocytopenia, unspecified: Secondary | ICD-10-CM | POA: Diagnosis not present

## 2020-03-11 DIAGNOSIS — R7309 Other abnormal glucose: Secondary | ICD-10-CM | POA: Diagnosis not present

## 2020-03-11 DIAGNOSIS — Z8572 Personal history of non-Hodgkin lymphomas: Secondary | ICD-10-CM | POA: Diagnosis not present

## 2020-03-11 DIAGNOSIS — H348132 Central retinal vein occlusion, bilateral, stable: Secondary | ICD-10-CM | POA: Diagnosis not present

## 2020-03-11 DIAGNOSIS — I10 Essential (primary) hypertension: Secondary | ICD-10-CM | POA: Diagnosis not present

## 2020-03-23 ENCOUNTER — Telehealth: Payer: Self-pay | Admitting: Orthopaedic Surgery

## 2020-03-23 DIAGNOSIS — S72002S Fracture of unspecified part of neck of left femur, sequela: Secondary | ICD-10-CM

## 2020-03-23 DIAGNOSIS — M79605 Pain in left leg: Secondary | ICD-10-CM

## 2020-03-23 NOTE — Telephone Encounter (Signed)
Patient called.   He is requesting he be set up for outpatient PT. His home PT feels this transition is okay  Call back: [phone]

## 2020-03-23 NOTE — Telephone Encounter (Signed)
Please advise 

## 2020-03-24 NOTE — Telephone Encounter (Signed)
Yes OK to do thanks

## 2020-03-24 NOTE — Addendum Note (Signed)
Addended by: Meyer Cory on: 03/24/2020 04:26 PM   Modules accepted: Orders

## 2020-03-24 NOTE — Telephone Encounter (Signed)
Referral entered. Patient advised.  

## 2020-03-25 ENCOUNTER — Ambulatory Visit (INDEPENDENT_AMBULATORY_CARE_PROVIDER_SITE_OTHER): Payer: Medicare HMO

## 2020-03-25 ENCOUNTER — Encounter: Payer: Self-pay | Admitting: Surgery

## 2020-03-25 ENCOUNTER — Ambulatory Visit: Payer: Medicare HMO | Admitting: Surgery

## 2020-03-25 VITALS — BP 170/87 | HR 74 | Ht 69.0 in | Wt 190.0 lb

## 2020-03-25 DIAGNOSIS — R0781 Pleurodynia: Secondary | ICD-10-CM

## 2020-03-25 DIAGNOSIS — M79605 Pain in left leg: Secondary | ICD-10-CM

## 2020-03-25 DIAGNOSIS — S20211A Contusion of right front wall of thorax, initial encounter: Secondary | ICD-10-CM | POA: Diagnosis not present

## 2020-03-25 NOTE — Progress Notes (Signed)
Office Visit Note   Patient: [name] [name]           Date of Birth: 1940-11-02           MRN: [id number] Visit Date: 03/25/2020              Requested by: [name], MD [place] [place],  [place] [address] PCP: [name], MD   Assessment & Plan: Visit Diagnoses:  1. Pain in left leg   2. Rib pain on left side   3. Contusion of rib on right side, initial encounter     Plan: I did review x-rays with Dr. Lorin Mercy today. We do not see an acute fracture of the left femur. Patient continue to ambulate with his cane. Follow-up in 2 weeks for recheck. If he is doing well and not having any issues he may cancel that appointment. If pain worsens before next office visit he will return sooner. Rib contusion will also gradually improve. Let us know if that becomes more of an issue. All questions answered.  Follow-Up Instructions: Return in about 2 weeks (around 04/08/2020) for With Dr. Lorin Mercy for recheck left thigh pain.   Orders:  Orders Placed This Encounter  Procedures  . XR HIP UNILAT W OR W/O PELVIS 2-3 VIEWS LEFT  . XR Ribs Unilateral Left  . XR FEMUR MIN 2 VIEWS LEFT   No orders of the defined types were placed in this encounter.     Procedures: No procedures performed   Clinical Data: No additional findings.   Subjective: Chief Complaint  Patient presents with  . Left Leg - Pain  . Chest Pain    HPI 79 year old white male comes in today with complaints of left rib pain and left thigh pain. Patient states on Monday he was putting away his water hose outside when a lost his balance and fell into a flower bed. States that he landed on his left side hitting his ribs and also around his left distal femur. Patient is status post ORIF left hip for proximal femur fracture. Patient also had retrograde IM nail removed for previous left distal femur fracture. No complaints of difficulty breathing. Rib pain has somewhat improved. He currently is ambulating  with a single prong cane and does localize his discomfort to the left distal femur. Proximal hip is doing well.   Objective: Vital Signs: BP (!) 170/87   Pulse 74   Ht 5\' 9"  (1.753 m)   Wt 190 lb (86.2 kg)   BMI 28.06 kg/m   Physical Exam HENT:     Head: Normocephalic and atraumatic.  Eyes:     Extraocular Movements: Extraocular movements intact.     Pupils: Pupils are equal, round, and reactive to light.  Musculoskeletal:     Comments: Left chest no bruising or swelling.  He has some soreness lower anterior rib area.  No deformity.  No respiratory distress.  Negative logroll bilateral hips.  Negative straight leg raise.  Patient does have some tenderness of the left mid to distal thigh.  No swelling.  Knee unremarkable.  Ambulates well with single prong cane.  Neurological:     General: No focal deficit present.     Mental Status: He is alert.     Ortho Exam  Specialty Comments:  No specialty comments available.  Imaging: XR FEMUR MIN 2 VIEWS LEFT  Result Date: 03/25/2020 Left femur shows old healed distal femur fracture.  No signs of new fracture.  X-rays reviewed with Dr. Lorin Mercy.  XR HIP UNILAT W OR W/O PELVIS 2-3 VIEWS LEFT  Result Date: 03/25/2020 Left hip hardware intact.  No acute finding.  XR Ribs Unilateral Left  Result Date: 03/25/2020 Rib films today did not show any signs of acute fracture.    PMFS History: Patient Active Problem List   Diagnosis Date Noted  . Multiple facial fractures, closed, initial encounter ([place]) 02/04/2020  . Closed left hip fracture ([place]) 10/04/2019  . Essential hypertension 10/04/2019  . Fall   . Port-A-Cath in place 02/04/2019  . Thrombocytopenia ([place]) 01/22/2018  . 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 History of arthroscopy of left shoulder 07/12/2016  . Port catheter in place 02/22/2016  . 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 Vitamin D deficiency 11/21/2012  . Lymphoma ([place]) 06/13/2012   Past Medical History:  Diagnosis Date  . Basal cell carcinoma 06/08/1998   Upper lip - CX3+5FU  . Basal cell carcinoma 06/22/1998   Left front neck - CX3+5FU  . Basal cell carcinoma 03/11/2004   Right temple [facility])  . Basal cell carcinoma 04/24/2016   Left top shoulder - Clear  . Basal cell carcinoma 01/14/2018   Right sideburn - CX3+5FU  . Femur fracture ([place])   . Hip fracture ([place])   . Hypertension   . Malignant neoplasm of pancreas, part unspecified   . Other malignant lymphomas, unspecified site, extranodal and solid organ sites   . Squamous cell carcinoma of skin 09/26/2001   Left forehead - CX3+excision  . Squamous cell carcinoma of skin 03/11/2004   Post right scalp - CX3+5FU  . Squamous cell carcinoma of skin 03/11/2004   Left forehead (MOHs)  . Squamous cell carcinoma of skin 03/11/2004   Right sideburn - CX3+5FU+excision  . Squamous cell carcinoma of skin 12/01/2004   Left temple - CX3+excision  . Squamous cell carcinoma of skin 04/13/2006   Mid scalp, sup - tx p bx  . Squamous cell carcinoma of skin 04/13/2006   Mid scalp, inf - tx p bx  . Squamous cell carcinoma of skin 04/13/2006   Left temple - tx p bx  . Squamous cell carcinoma of skin 06/30/2010   Left low back - tx p bx  . Squamous cell carcinoma of skin 06/23/2013   Front scalp - CX3+5FU  . Squamous cell carcinoma of skin 01/14/2018   Left scalp - CX3+5FU    History reviewed. No pertinent family history.  Past Surgical History:  Procedure Laterality Date  . FEMUR IM NAIL Left 10/06/2019   Procedure: INTRAMEDULLARY (IM) RETROGRADE FEMORAL NAIL REMOVAL ( [employer]);  Surgeon: Marybelle Killings, MD;  Location: [place];  Service: Orthopedics;  Laterality: Left;  . FEMUR SURGERY Bilateral   . INTRAMEDULLARY (IM) NAIL INTERTROCHANTERIC Left 10/06/2019   Procedure: BIOMET AFFIXUS SHORT NAIL,  INTERTROCHANTRIC;  Surgeon: Marybelle Killings, MD;  Location: [place];  Service: Orthopedics;  Laterality: Left;  . LEG SURGERY Left   . SHOULDER SURGERY Left    Social History   Occupational History  . Not on file  Tobacco Use  . Smoking status: Never Smoker  . Smokeless tobacco: Never Used  Vaping Use  . Vaping Use: Never used  Substance and Sexual Activity  . Alcohol use: No  . Drug use: No  . Sexual activity: Not on file

## 2020-03-31 ENCOUNTER — Encounter: Payer: Self-pay | Admitting: Physical Therapy

## 2020-03-31 ENCOUNTER — Other Ambulatory Visit: Payer: Self-pay

## 2020-03-31 ENCOUNTER — Ambulatory Visit: Payer: Medicare HMO | Attending: Orthopaedic Surgery | Admitting: Physical Therapy

## 2020-03-31 DIAGNOSIS — M25552 Pain in left hip: Secondary | ICD-10-CM | POA: Diagnosis not present

## 2020-03-31 DIAGNOSIS — R262 Difficulty in walking, not elsewhere classified: Secondary | ICD-10-CM | POA: Insufficient documentation

## 2020-03-31 DIAGNOSIS — M6281 Muscle weakness (generalized): Secondary | ICD-10-CM | POA: Diagnosis not present

## 2020-03-31 NOTE — Therapy (Signed)
[place], [place], [address] Phone: [phone]   Fax:  [phone]  Physical Therapy Evaluation  Patient Details  Name: [name] [name] MRN: [id number] Date of Birth: 1941/03/31 Referring Provider (PT): Rodell Perna   Encounter Date: 03/31/2020   PT End of Session - 03/31/20 1659    Visit Number 1    Date for PT Re-Evaluation 06/01/20    PT Start Time 1450    PT Stop Time 1530    PT Time Calculation (min) 40 min    Activity Tolerance Patient limited by pain    Behavior During Therapy [facility] for tasks assessed/performed           Past Medical History:  Diagnosis Date   Basal cell carcinoma 06/08/1998   Upper lip - CX3+5FU   Basal cell carcinoma 06/22/1998   Left front neck - CX3+5FU   Basal cell carcinoma 03/11/2004   Right temple [facility])   Basal cell carcinoma 04/24/2016   Left top shoulder - Clear   Basal cell carcinoma 01/14/2018   Right sideburn - CX3+5FU   Femur fracture ([facility])    Hip fracture ([place])    Hypertension    Malignant neoplasm of pancreas, part unspecified    Other malignant lymphomas, unspecified site, extranodal and solid organ sites    Squamous cell carcinoma of skin 09/26/2001   Left forehead - CX3+excision   Squamous cell carcinoma of skin 03/11/2004   Post right scalp - CX3+5FU   Squamous cell carcinoma of skin 03/11/2004   Left forehead (MOHs)   Squamous cell carcinoma of skin 03/11/2004   Right sideburn - CX3+5FU+excision   Squamous cell carcinoma of skin 12/01/2004   Left temple - CX3+excision   Squamous cell carcinoma of skin 04/13/2006   Mid scalp, sup - tx p bx   Squamous cell carcinoma of skin 04/13/2006   Mid scalp, inf - tx p bx   Squamous cell carcinoma of skin 04/13/2006   Left temple - tx p bx   Squamous cell carcinoma of skin 06/30/2010   Left low back - tx p bx   Squamous cell carcinoma of skin 06/23/2013   Front scalp -  CX3+5FU   Squamous cell carcinoma of skin 01/14/2018   Left scalp - CX3+5FU    Past Surgical History:  Procedure Laterality Date   FEMUR IM NAIL Left 10/06/2019   Procedure: INTRAMEDULLARY (IM) RETROGRADE FEMORAL NAIL REMOVAL ( [employer]);  Surgeon: Marybelle Killings, MD;  Location: [place];  Service: Orthopedics;  Laterality: Left;   FEMUR SURGERY Bilateral    INTRAMEDULLARY (IM) NAIL INTERTROCHANTERIC Left 10/06/2019   Procedure: BIOMET AFFIXUS SHORT NAIL,  INTERTROCHANTRIC;  Surgeon: Marybelle Killings, MD;  Location: [place];  Service: Orthopedics;  Laterality: Left;   LEG SURGERY Left    SHOULDER SURGERY Left     There were no vitals filed for this visit.    Subjective Assessment - 03/31/20 1457    Subjective Pt reports L femur fx in Jan 2021 after a fall. Pt was in inpatient rehab and followed up with home health. Pt reports he has transitioned to [facility] and uses walker at night or for longer distances. Pt reports a fall in the garden on Monday 7/5; didn't clear foot and stumbled. Has a lot of pain in L hip/femur and L ribs. Pt states that xrays were negative for any fx.    Pertinent History prior fx to distal  femur after paragliding accident    Limitations Standing;Walking;House hold activities    Diagnostic tests xrays neg for fx    Patient Stated Goals reduce pain    Currently in Pain? Yes    Pain Score 3     Pain Location Leg    Pain Orientation Left    Pain Descriptors / Indicators Sharp    Pain Type Acute pain    Pain Onset 1 to 4 weeks ago    Pain Frequency Intermittent    Aggravating Factors  standing, walking, laying on L side    Pain Relieving Factors rest              [facility] PT Assessment - 03/31/20 0001      Assessment   Medical Diagnosis L femur fx and L hip pain    Referring Provider (PT) Rodell Perna    Onset Date/Surgical Date 10/04/19    Prior Therapy PT      Precautions   Precautions Fall      Restrictions   Weight Bearing Restrictions No       Balance Screen   Has the patient fallen in the past 6 months Yes    How many times? 2    Has the patient had a decrease in activity level because of a fear of falling?  Yes    Is the patient reluctant to leave their home because of a fear of falling?  No      Home Environment   Additional Comments stairs in home; has not tried since fall      Prior Function   Level of Independence Independent    Vocation Retired    Leisure gardening, walking      Functional Tests   Functional tests Sit to [employer] to Stand   Comments difficulty with eccentric control of LE      Posture/Postural Control   Posture/Postural Control Postural limitations    Postural Limitations Rounded Shoulders;Forward head;Increased lumbar lordosis;Flexed trunk;Weight shift right      ROM / Strength   AROM / PROM / Strength AROM;Strength      AROM   Overall AROM Comments limited hip flexion d/t pain      Strength   Overall Strength Comments LE strength 4-/5; limited in accuracy d/t pt reports of pain in L hip      Flexibility   Soft Tissue Assessment /Muscle Length yes    Hamstrings tight      Transfers   Five time sit to stand comments  unable to complete; excessive use of UE; needed elevated table to stand with UE on thighs; difficutly with eccentric control      Ambulation/Gait   Ambulation/Gait Yes    Ambulation/Gait Assistance 5: Supervision    Ambulation/Gait Assistance Details supervision-CGA    Ambulation Distance (Feet) 50 Feet    Assistive device Straight cane    Gait Pattern Step-to pattern;Decreased arm swing - left;Decreased step length - right;Decreased stance time - left;Decreased hip/knee flexion - right;Decreased hip/knee flexion - left;Decreased weight shift to left;Ataxic;Lateral trunk lean to right;Lateral trunk lean to left;Wide base of support    Gait velocity diminished    Gait Comments very slow, antalgic gait. Widened BOS and difficulty with foot clearance B                       Objective measurements completed on examination: See above findings.  [place] Adult PT Treatment/Exercise - 03/31/20 0001      Exercises   Exercises Knee/Hip      Knee/Hip Exercises: Seated   Long Arc Quad Both;1 set;10 reps    Marching Both;1 set;10 reps    Sit to [employer] 1 set;10 reps;with UE support                  PT Education - 03/31/20 1659    Education Details Pt educated on HEP and POC    Person(s) Educated Patient    Methods Explanation;Demonstration;Handout    Comprehension Verbalized understanding;Returned demonstration            PT Short Term Goals - 03/31/20 1758      PT SHORT TERM GOAL #1   Title pt will be I with inital HEP )    Time 2    Period Weeks    Status New    Target Date 04/14/20             PT Long Term Goals - 03/31/20 1758      PT LONG TERM GOAL #1   Title Pt will demonstrate TUG with LRAD <15 sec    Time 6    Period Weeks    Status New    Target Date 05/26/20      PT LONG TERM GOAL #2   Title Pt will demonstrate STS x5 with UE support on thighs from chair    Time 6    Period Weeks    Status New    Target Date 05/12/20      PT LONG TERM GOAL #3   Title Pt will demonstrate gait WFL over level and unlevel surfaces >500 ft with LRAD and supervision assist    Time 6    Period Weeks    Status New    Target Date 05/12/20      PT LONG TERM GOAL #4   Title Pt will report reduction in L hip pain by 50%    Time 6    Period Weeks    Status New    Target Date 05/12/20                  Plan - 03/31/20 1700    Clinical Impression Statement Pt presents to clinic s/p L proximal femur fx 10/04/2019; pt underwent inpatient rehab and was recently discharged from [facility]. Pt reports he was doing well until a fall in the garden on 03/22/2020; fell onto L hip. Xrays were negative for fracture but pt has experienced significant pain and reports decrease in function since then. Pt demonstrates  decreased LLE strength, LE ROM deficits, pain with L hip flexion, functional LE weakness, difficutly with STS, and abnormal gait. Pt demos gait with widened BOS, antalgic, shuffling steps, and poor foot clearance. Pt would benefit from skilled PT to address the above deficits.    Personal Factors and Comorbidities Comorbidity 1    Comorbidities HTN    Examination-Activity Limitations Bend;Lift;Stand;Stairs;Squat;Locomotion Level    Examination-Participation Restrictions Community Activity;Interpersonal Relationship    Stability/Clinical Decision Making Stable/Uncomplicated    Clinical Decision Making Low    Rehab Potential Good    PT Frequency 2x / week    PT Duration 6 weeks    PT Treatment/Interventions ADLs/Self Care Home Management;Electrical Stimulation;Iontophoresis 4mg /ml Dexamethasone;Moist Heat;Neuromuscular re-education;Balance training;Therapeutic exercise;Therapeutic activities;Functional mobility training;Stair training;Gait training;Patient/family education;Manual techniques;Passive range of motion    PT Next Visit Plan TUG, gait with LRAD, LE strengthening/flexibility, manual as  indicated    PT Home Exercise Plan seated marches, LAQ, STS with UE support (instructed to continue ex's from [facility] PT)    Consulted and Agree with Plan of Care Patient           Patient will benefit from skilled therapeutic intervention in order to improve the following deficits and impairments:  Abnormal gait, Decreased range of motion, Difficulty walking, Decreased endurance, Decreased safety awareness, Decreased activity tolerance, Pain, Decreased balance, Hypomobility, Decreased strength  Visit Diagnosis: Pain in left hip  Muscle weakness (generalized)  Difficulty in walking, not elsewhere classified     Problem List Patient Active Problem List   Diagnosis Date Noted   Multiple facial fractures, closed, initial encounter ([place]) 02/04/2020   Closed left hip fracture ([place]) 10/04/2019    Essential hypertension 10/04/2019   Fall    Port-A-Cath in place 02/04/2019   Thrombocytopenia ([place]) 01/22/2018   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

## 2020-04-01 DIAGNOSIS — Z91041 Radiographic dye allergy status: Secondary | ICD-10-CM | POA: Diagnosis not present

## 2020-04-01 DIAGNOSIS — Z882 Allergy status to sulfonamides status: Secondary | ICD-10-CM | POA: Diagnosis not present

## 2020-04-01 DIAGNOSIS — Z8249 Family history of ischemic heart disease and other diseases of the circulatory system: Secondary | ICD-10-CM | POA: Diagnosis not present

## 2020-04-01 DIAGNOSIS — Z9181 History of falling: Secondary | ICD-10-CM | POA: Diagnosis not present

## 2020-04-01 DIAGNOSIS — C8590 Non-Hodgkin lymphoma, unspecified, unspecified site: Secondary | ICD-10-CM | POA: Diagnosis not present

## 2020-04-01 DIAGNOSIS — Z809 Family history of malignant neoplasm, unspecified: Secondary | ICD-10-CM | POA: Diagnosis not present

## 2020-04-01 DIAGNOSIS — M1990 Unspecified osteoarthritis, unspecified site: Secondary | ICD-10-CM | POA: Diagnosis not present

## 2020-04-01 DIAGNOSIS — I10 Essential (primary) hypertension: Secondary | ICD-10-CM | POA: Diagnosis not present

## 2020-04-06 ENCOUNTER — Ambulatory Visit: Payer: Medicare HMO | Admitting: Dermatology

## 2020-04-06 ENCOUNTER — Encounter: Payer: Self-pay | Admitting: Dermatology

## 2020-04-06 ENCOUNTER — Other Ambulatory Visit: Payer: Self-pay

## 2020-04-06 DIAGNOSIS — C44319 Basal cell carcinoma of skin of other parts of face: Secondary | ICD-10-CM

## 2020-04-06 DIAGNOSIS — C44212 Basal cell carcinoma of skin of right ear and external auricular canal: Secondary | ICD-10-CM

## 2020-04-06 DIAGNOSIS — C4491 Basal cell carcinoma of skin, unspecified: Secondary | ICD-10-CM

## 2020-04-06 DIAGNOSIS — D485 Neoplasm of uncertain behavior of skin: Secondary | ICD-10-CM

## 2020-04-06 DIAGNOSIS — L570 Actinic keratosis: Secondary | ICD-10-CM

## 2020-04-06 HISTORY — DX: Basal cell carcinoma of skin, unspecified: C44.91

## 2020-04-06 NOTE — Progress Notes (Signed)
Dr Link Snuffer referral with path

## 2020-04-06 NOTE — Patient Instructions (Signed)

## 2020-04-07 ENCOUNTER — Encounter: Payer: Self-pay | Admitting: Physical Therapy

## 2020-04-07 ENCOUNTER — Encounter: Payer: Self-pay | Admitting: Orthopaedic Surgery

## 2020-04-07 ENCOUNTER — Ambulatory Visit: Payer: Medicare HMO | Admitting: Physical Therapy

## 2020-04-07 ENCOUNTER — Ambulatory Visit: Payer: Medicare HMO | Admitting: Orthopaedic Surgery

## 2020-04-07 VITALS — Ht 69.0 in | Wt 190.0 lb

## 2020-04-07 DIAGNOSIS — R262 Difficulty in walking, not elsewhere classified: Secondary | ICD-10-CM | POA: Diagnosis not present

## 2020-04-07 DIAGNOSIS — M25552 Pain in left hip: Secondary | ICD-10-CM

## 2020-04-07 DIAGNOSIS — S72002S Fracture of unspecified part of neck of left femur, sequela: Secondary | ICD-10-CM

## 2020-04-07 DIAGNOSIS — M6281 Muscle weakness (generalized): Secondary | ICD-10-CM

## 2020-04-07 NOTE — Progress Notes (Signed)
Office Visit Note   Patient: [name] [name]           Date of Birth: 08-20-1941           MRN: [id number] Visit Date: 04/07/2020              Requested by: [name], MD [place] [place],  [place] [address] PCP: [name], MD   Assessment & Plan: Visit Diagnoses:  1. Closed fracture of left hip, sequela     Plan: Patient continue to work on gait sequence and quad strengthening and increase his distance.  Continue cane at this time.  Follow-up here as an as-needed basis.  He is happy the results of surgery.  Follow-Up Instructions: Return if symptoms worsen or fail to improve.   Orders:  No orders of the defined types were placed in this encounter.  No orders of the defined types were placed in this encounter.     Procedures: No procedures performed   Clinical Data: No additional findings.   Subjective: Chief Complaint  Patient presents with  . Left Leg - Follow-up    HPI post removal retrograde IM nail left femur and short truck nail fixation for inotrope.  He can walk walk some without the cane but has to swing his foot forward with some quad weakness.  He is practicing doing heel toe gait still working with therapy.  He has some soreness.  He is not taking any medication for it.  Daughter is with him and encouraged him to use Aleve but he was concerned that he might not be able to stop it later.  Review of Systems all other systems are negative as pertains HPI.   Objective: Vital Signs: Ht 5\' 9"  (1.753 m)   Wt 190 lb (86.2 kg)   BMI 28.06 kg/m   Physical Exam Constitutional:      Appearance: He is well-developed.  HENT:     Head: Normocephalic and atraumatic.  Eyes:     Pupils: Pupils are equal, round, and reactive to light.  Neck:     Thyroid: No thyromegaly.     Trachea: No tracheal deviation.  Cardiovascular:     Rate and Rhythm: Normal rate.  Pulmonary:     Effort: Pulmonary effort is normal.     Breath sounds: No  wheezing.  Abdominal:     General: Bowel sounds are normal.     Palpations: Abdomen is soft.  Skin:    General: Skin is warm and dry.     Capillary Refill: Capillary refill takes less than 2 seconds.  Neurological:     Mental Status: He is alert and oriented to person, place, and time.  Psychiatric:        Behavior: Behavior normal.        Thought Content: Thought content normal.        Judgment: Judgment normal.     Ortho Exam patient uses arms to pop up from sitting to standing.  Ambulates well with a cane is moving better and is creasing his stamina distance.  Still has some quad weakness but this is improved.  Specialty Comments:  No specialty comments available.  Imaging: No results found.   PMFS History: Patient Active Problem List   Diagnosis Date Noted  . Multiple facial fractures, closed, initial encounter ([place]) 02/04/2020  . Closed left hip fracture ([place]) 10/04/2019  . Essential hypertension 10/04/2019  . Fall   . Port-A-Cath in place 02/04/2019  .  Thrombocytopenia ([place]) 01/22/2018  . 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 History of arthroscopy of left shoulder 07/12/2016  . Port catheter in place 02/22/2016  . 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 Vitamin D deficiency 11/21/2012  . Lymphoma ([place]) 06/13/2012   Past Medical History:  Diagnosis Date  . Basal cell carcinoma 06/08/1998   Upper lip - CX3+5FU  . Basal cell carcinoma 06/22/1998   Left front neck - CX3+5FU  . Basal cell carcinoma 03/11/2004   Right temple [facility])  . Basal cell carcinoma 04/24/2016   Left top shoulder - Clear  . Basal cell carcinoma 01/14/2018   Right sideburn - CX3+5FU  . Femur fracture ([place])   . Hip fracture ([place])   . Hypertension   . Malignant neoplasm of pancreas, part unspecified   . Other malignant lymphomas, unspecified site, extranodal and solid organ sites   .  Squamous cell carcinoma of skin 09/26/2001   Left forehead - CX3+excision  . Squamous cell carcinoma of skin 03/11/2004   Post right scalp - CX3+5FU  . Squamous cell carcinoma of skin 03/11/2004   Left forehead (MOHs)  . Squamous cell carcinoma of skin 03/11/2004   Right sideburn - CX3+5FU+excision  . Squamous cell carcinoma of skin 12/01/2004   Left temple - CX3+excision  . Squamous cell carcinoma of skin 04/13/2006   Mid scalp, sup - tx p bx  . Squamous cell carcinoma of skin 04/13/2006   Mid scalp, inf - tx p bx  . Squamous cell carcinoma of skin 04/13/2006   Left temple - tx p bx  . Squamous cell carcinoma of skin 06/30/2010   Left low back - tx p bx  . Squamous cell carcinoma of skin 06/23/2013   Front scalp - CX3+5FU  . Squamous cell carcinoma of skin 01/14/2018   Left scalp - CX3+5FU    No family history on file.  Past Surgical History:  Procedure Laterality Date  . FEMUR IM NAIL Left 10/06/2019   Procedure: INTRAMEDULLARY (IM) RETROGRADE FEMORAL NAIL REMOVAL ( [employer]);  Surgeon: Marybelle Killings, MD;  Location: [place];  Service: Orthopedics;  Laterality: Left;  . FEMUR SURGERY Bilateral   . INTRAMEDULLARY (IM) NAIL INTERTROCHANTERIC Left 10/06/2019   Procedure: BIOMET AFFIXUS SHORT NAIL,  INTERTROCHANTRIC;  Surgeon: Marybelle Killings, MD;  Location: [place];  Service: Orthopedics;  Laterality: Left;  . LEG SURGERY Left   . SHOULDER SURGERY Left    Social History   Occupational History  . Not on file  Tobacco Use  . Smoking status: Never Smoker  . Smokeless tobacco: Never Used  Vaping Use  . Vaping Use: Never used  Substance and Sexual Activity  . Alcohol use: No  . Drug use: No  . Sexual activity: Not on file

## 2020-04-07 NOTE — Therapy (Signed)
[place], [place], [address] Phone: [phone]   Fax:  [phone]  Physical Therapy Treatment  Patient Details  Name: [name] [name] MRN: [id number] Date of Birth: Feb 16, 1941 Referring Provider (PT): Rodell Perna   Encounter Date: 04/07/2020   PT End of Session - 04/07/20 1455    Visit Number 2    Date for PT Re-Evaluation 06/01/20    PT Start Time 1400    PT Stop Time 1445    PT Time Calculation (min) 45 min    Activity Tolerance Patient tolerated treatment well;Patient limited by pain    Behavior During Therapy [facility] for tasks assessed/performed           Past Medical History:  Diagnosis Date  . Basal cell carcinoma 06/08/1998   Upper lip - CX3+5FU  . Basal cell carcinoma 06/22/1998   Left front neck - CX3+5FU  . Basal cell carcinoma 03/11/2004   Right temple [facility])  . Basal cell carcinoma 04/24/2016   Left top shoulder - Clear  . Basal cell carcinoma 01/14/2018   Right sideburn - CX3+5FU  . Femur fracture ([place])   . Hip fracture ([place])   . Hypertension   . Malignant neoplasm of pancreas, part unspecified   . Other malignant lymphomas, unspecified site, extranodal and solid organ sites   . Squamous cell carcinoma of skin 09/26/2001   Left forehead - CX3+excision  . Squamous cell carcinoma of skin 03/11/2004   Post right scalp - CX3+5FU  . Squamous cell carcinoma of skin 03/11/2004   Left forehead (MOHs)  . Squamous cell carcinoma of skin 03/11/2004   Right sideburn - CX3+5FU+excision  . Squamous cell carcinoma of skin 12/01/2004   Left temple - CX3+excision  . Squamous cell carcinoma of skin 04/13/2006   Mid scalp, sup - tx p bx  . Squamous cell carcinoma of skin 04/13/2006   Mid scalp, inf - tx p bx  . Squamous cell carcinoma of skin 04/13/2006   Left temple - tx p bx  . Squamous cell carcinoma of skin 06/30/2010   Left low back - tx p bx  . Squamous cell carcinoma of  skin 06/23/2013   Front scalp - CX3+5FU  . Squamous cell carcinoma of skin 01/14/2018   Left scalp - CX3+5FU    Past Surgical History:  Procedure Laterality Date  . FEMUR IM NAIL Left 10/06/2019   Procedure: INTRAMEDULLARY (IM) RETROGRADE FEMORAL NAIL REMOVAL ( [employer]);  Surgeon: Marybelle Killings, MD;  Location: [place];  Service: Orthopedics;  Laterality: Left;  . FEMUR SURGERY Bilateral   . INTRAMEDULLARY (IM) NAIL INTERTROCHANTERIC Left 10/06/2019   Procedure: BIOMET AFFIXUS SHORT NAIL,  INTERTROCHANTRIC;  Surgeon: Marybelle Killings, MD;  Location: [place];  Service: Orthopedics;  Laterality: Left;  . LEG SURGERY Left   . SHOULDER SURGERY Left     There were no vitals filed for this visit.   Subjective Assessment - 04/07/20 1407    Subjective Pt states that he is in pain but feeling better today    Currently in Pain? Yes    Pain Score 2     Pain Location Leg    Pain Orientation Left                             [facility] Adult PT Treatment/Exercise - 04/07/20 0001      Knee/Hip Exercises: Aerobic  Nustep L5 x 8 min      Knee/Hip Exercises: Seated   Long Arc Quad Both;10 reps;2 sets    [employer] Weight 2 lbs.    Ball Squeeze 2x10 with 3 sec hold    Clamshell with TheraBand Green   2x10 in sitting   Other Seated Knee/Hip Exercises heel raises/toe raises 2x15    Marching Both;2 sets;10 reps    Marching Weights 2 lbs.    Hamstring Curl Both;2 sets;10 reps    Hamstring Limitations green TB    Sit to Sand 2 sets;5 reps;with UE support   cues for eccentric control                   PT Short Term Goals - 03/31/20 1758      PT SHORT TERM GOAL #1   Title pt will be I with inital HEP )    Time 2    Period Weeks    Status New    Target Date 04/14/20             PT Long Term Goals - 03/31/20 1758      PT LONG TERM GOAL #1   Title Pt will demonstrate TUG with LRAD <15 sec    Time 6    Period Weeks    Status New    Target Date 05/26/20        PT LONG TERM GOAL #2   Title Pt will demonstrate STS x5 with UE support on thighs from chair    Time 6    Period Weeks    Status New    Target Date 05/12/20      PT LONG TERM GOAL #3   Title Pt will demonstrate gait WFL over level and unlevel surfaces >500 ft with LRAD and supervision assist    Time 6    Period Weeks    Status New    Target Date 05/12/20      PT LONG TERM GOAL #4   Title Pt will report reduction in L hip pain by 50%    Time 6    Period Weeks    Status New    Target Date 05/12/20                 Plan - 04/07/20 1456    Clinical Impression Statement Pt tolerated progression to TE well. Cues for full ROM with LAQ, shaking with knee flexion with increasing fatigue. Pt able to perform STS from mat table with UE support on thighs; difficulty with eccentric control with increasing fatigue. Required frequent rest breaks thoughout session.    PT Treatment/Interventions ADLs/Self Care Home Management;Electrical Stimulation;Iontophoresis 4mg /ml Dexamethasone;Moist Heat;Neuromuscular re-education;Balance training;Therapeutic exercise;Therapeutic activities;Functional mobility training;Stair training;Gait training;Patient/family education;Manual techniques;Passive range of motion    PT Next Visit Plan TUG, gait with LRAD, LE strengthening/flexibility, manual as indicated    Consulted and Agree with Plan of Care Patient           Patient will benefit from skilled therapeutic intervention in order to improve the following deficits and impairments:  Abnormal gait, Decreased range of motion, Difficulty walking, Decreased endurance, Decreased safety awareness, Decreased activity tolerance, Pain, Decreased balance, Hypomobility, Decreased strength  Visit Diagnosis: Pain in left hip  Muscle weakness (generalized)  Difficulty in walking, not elsewhere classified     Problem List Patient Active Problem List   Diagnosis Date Noted  . Multiple facial fractures,  closed, initial encounter ([place]) 02/04/2020  . Closed left  hip fracture ([place]) 10/04/2019  . Essential hypertension 10/04/2019  . Fall   . Port-A-Cath in place 02/04/2019  . Thrombocytopenia ([place]) 01/22/2018  . 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 History of arthroscopy of left shoulder 07/12/2016  . Port catheter in place 02/22/2016  . 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 Vitamin D deficiency 11/21/2012  . Lymphoma ([place]) 06/13/2012   Amador Cunas, PT, DPT Donald Prose Gavinn Collard 04/07/2020, 2:58 PM  [place] [address] [place], [place], [address] Phone: [phone]   Fax:  [phone]  Name: [name] [name] MRN: [id number] Date of Birth: 1941-04-18

## 2020-04-08 ENCOUNTER — Ambulatory Visit: Payer: Medicare HMO | Admitting: Physical Therapy

## 2020-04-08 ENCOUNTER — Encounter: Payer: Self-pay | Admitting: Physical Therapy

## 2020-04-08 ENCOUNTER — Telehealth: Payer: Self-pay

## 2020-04-08 ENCOUNTER — Other Ambulatory Visit: Payer: Self-pay

## 2020-04-08 DIAGNOSIS — R262 Difficulty in walking, not elsewhere classified: Secondary | ICD-10-CM | POA: Diagnosis not present

## 2020-04-08 DIAGNOSIS — M6281 Muscle weakness (generalized): Secondary | ICD-10-CM

## 2020-04-08 DIAGNOSIS — M25552 Pain in left hip: Secondary | ICD-10-CM

## 2020-04-08 NOTE — Telephone Encounter (Signed)
Phone call to patient with his pathology results. Patient aware of results.  

## 2020-04-08 NOTE — Telephone Encounter (Signed)
-----   Message from Lavonna Monarch, MD sent at 04/08/2020  6:48 AM EDT ----- Schedule surgery with Dr. Darene Lamer

## 2020-04-08 NOTE — Therapy (Signed)
[place], [place], [address] Phone: [phone]   Fax:  [phone]  Physical Therapy Treatment  Patient Details  Name: [name] [name] MRN: [id number] Date of Birth: 17-Oct-1940 Referring Provider (PT): Rodell Perna   Encounter Date: 04/08/2020   PT End of Session - 04/08/20 1641    Visit Number 3    Date for PT Re-Evaluation 06/01/20    PT Start Time 1600    PT Stop Time 1641    PT Time Calculation (min) 41 min    Activity Tolerance Patient tolerated treatment well;Patient limited by pain    Behavior During Therapy [facility] for tasks assessed/performed           Past Medical History:  Diagnosis Date  . Basal cell carcinoma 06/08/1998   Upper lip - CX3+5FU  . Basal cell carcinoma 06/22/1998   Left front neck - CX3+5FU  . Basal cell carcinoma 03/11/2004   Right temple [facility])  . Basal cell carcinoma 04/24/2016   Left top shoulder - Clear  . Basal cell carcinoma 01/14/2018   Right sideburn - CX3+5FU  . Femur fracture ([place])   . Hip fracture ([place])   . Hypertension   . Malignant neoplasm of pancreas, part unspecified   . 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 Other malignant lymphomas, unspecified site, extranodal and solid organ sites   . Squamous cell carcinoma of skin 09/26/2001   Left forehead - CX3+excision  . Squamous cell carcinoma of skin 03/11/2004   Post right scalp - CX3+5FU  . Squamous cell carcinoma of skin 03/11/2004   Left forehead (MOHs)  . Squamous cell carcinoma of skin 03/11/2004   Right sideburn - CX3+5FU+excision  . Squamous cell carcinoma of skin 12/01/2004   Left temple - CX3+excision  . Squamous cell carcinoma of skin 04/13/2006   Mid scalp, sup - tx p bx  . Squamous cell carcinoma of skin 04/13/2006   Mid scalp, inf - tx p bx  . Squamous  cell carcinoma of skin 04/13/2006   Left temple - tx p bx  . Squamous cell carcinoma of skin 06/30/2010   Left low back - tx p bx  . Squamous cell carcinoma of skin 06/23/2013   Front scalp - CX3+5FU  . Squamous cell carcinoma of skin 01/14/2018   Left scalp - CX3+5FU    Past Surgical History:  Procedure Laterality Date  . FEMUR IM NAIL Left 10/06/2019   Procedure: INTRAMEDULLARY (IM) RETROGRADE FEMORAL NAIL REMOVAL ( [employer]);  Surgeon: Marybelle Killings, MD;  Location: [place];  Service: Orthopedics;  Laterality: Left;  . FEMUR SURGERY Bilateral   . INTRAMEDULLARY (IM) NAIL INTERTROCHANTERIC Left 10/06/2019   Procedure: BIOMET AFFIXUS SHORT NAIL,  INTERTROCHANTRIC;  Surgeon: Marybelle Killings, MD;  Location: [place];  Service: Orthopedics;  Laterality: Left;  . LEG SURGERY Left   . SHOULDER SURGERY Left     There were no vitals filed for this visit.   Subjective Assessment - 04/08/20 1602    Subjective Not too bad for the shape Im in.    Currently in Pain? Yes    Pain Score 3     Pain Location Leg   Ribs still hurt from the second fall.  [facility] Adult PT Treatment/Exercise - 04/08/20 0001      High Level Balance   High Level Balance Activities Side stepping      Knee/Hip Exercises: Aerobic   Nustep L4 x 6 min      Knee/Hip Exercises: Standing   Other Standing Knee Exercises Standing march SPC 2x5       Knee/Hip Exercises: Seated   Long Arc Quad Both;10 reps;2 sets    [employer] Weight 2 lbs.    Ball Squeeze 2x10 with 3 sec hold    Hamstring Curl Both;2 sets;10 reps    Hamstring Limitations green TB    Sit to Sand 5 reps;without UE support;3 sets;Other (comment)   airex in blue chair, then x3 without airex                    PT Short Term Goals - 03/31/20 1758      PT SHORT TERM GOAL #1   Title pt will be I with inital HEP )    Time 2    Period Weeks    Status New    Target Date 04/14/20             PT  Long Term Goals - 03/31/20 1758      PT LONG TERM GOAL #1   Title Pt will demonstrate TUG with LRAD <15 sec    Time 6    Period Weeks    Status New    Target Date 05/26/20      PT LONG TERM GOAL #2   Title Pt will demonstrate STS x5 with UE support on thighs from chair    Time 6    Period Weeks    Status New    Target Date 05/12/20      PT LONG TERM GOAL #3   Title Pt will demonstrate gait WFL over level and unlevel surfaces >500 ft with LRAD and supervision assist    Time 6    Period Weeks    Status New    Target Date 05/12/20      PT LONG TERM GOAL #4   Title Pt will report reduction in L hip pain by 50%    Time 6    Period Weeks    Status New    Target Date 05/12/20                 Plan - 04/08/20 1642    Clinical Impression Statement Good carryover form last session with sit to stand using eccentric control. Visual shaking noted with LAQ. Cues needed not to flex hip with seated HS curls to get mote HS isolation. L hip weakness noted with side step going to his R side.    Personal Factors and Comorbidities Comorbidity 1    Comorbidities HTN    Examination-Activity Limitations Bend;Lift;Stand;Stairs;Squat;Locomotion Level    Examination-Participation Restrictions Community Activity;Interpersonal Relationship    Stability/Clinical Decision Making Stable/Uncomplicated    Rehab Potential Good    PT Frequency 2x / week    PT Duration 6 weeks    PT Treatment/Interventions ADLs/Self Care Home Management;Electrical Stimulation;Iontophoresis 4mg /ml Dexamethasone;Moist Heat;Neuromuscular re-education;Balance training;Therapeutic exercise;Therapeutic activities;Functional mobility training;Stair training;Gait training;Patient/family education;Manual techniques;Passive range of motion    PT Next Visit Plan TUG, gait with LRAD, LE strengthening/flexibility, manual as indicated           Patient will benefit from skilled therapeutic intervention in order to improve the  following deficits and impairments:  Abnormal gait, Decreased  range of motion, Difficulty walking, Decreased endurance, Decreased safety awareness, Decreased activity tolerance, Pain, Decreased balance, Hypomobility, Decreased strength  Visit Diagnosis: Difficulty in walking, not elsewhere classified  Muscle weakness (generalized)  Pain in left hip     Problem List Patient Active Problem List   Diagnosis Date Noted  . Multiple facial fractures, closed, initial encounter ([place]) 02/04/2020  . Closed left hip fracture ([place]) 10/04/2019  . Essential hypertension 10/04/2019  . Fall   . Port-A-Cath in place 02/04/2019  . Thrombocytopenia ([place]) 01/22/2018  . 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 History of arthroscopy of left shoulder 07/12/2016  . Port catheter in place 02/22/2016  . 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 Vitamin D deficiency 11/21/2012  . Lymphoma ([place]) 06/13/2012    Scot Jun, PTA 04/08/2020, 4:48 PM  [place] [address] [place], [place], [address] Phone: [phone]   Fax:  [phone]  Name: NITESH PITSTICK MRN: [id number] Date of Birth: [birth year]

## 2020-04-13 ENCOUNTER — Inpatient Hospital Stay: Payer: Medicare HMO | Attending: Internal Medicine

## 2020-04-13 ENCOUNTER — Ambulatory Visit: Payer: Medicare HMO | Admitting: Physical Therapy

## 2020-04-13 ENCOUNTER — Other Ambulatory Visit: Payer: Self-pay

## 2020-04-13 ENCOUNTER — Encounter: Payer: Self-pay | Admitting: Physical Therapy

## 2020-04-13 DIAGNOSIS — Z8572 Personal history of non-Hodgkin lymphomas: Secondary | ICD-10-CM | POA: Insufficient documentation

## 2020-04-13 DIAGNOSIS — Z95828 Presence of other vascular implants and grafts: Secondary | ICD-10-CM

## 2020-04-13 DIAGNOSIS — R262 Difficulty in walking, not elsewhere classified: Secondary | ICD-10-CM

## 2020-04-13 DIAGNOSIS — C8251 Diffuse follicle center lymphoma, lymph nodes of head, face, and neck: Secondary | ICD-10-CM

## 2020-04-13 DIAGNOSIS — Z452 Encounter for adjustment and management of vascular access device: Secondary | ICD-10-CM | POA: Insufficient documentation

## 2020-04-13 DIAGNOSIS — M6281 Muscle weakness (generalized): Secondary | ICD-10-CM

## 2020-04-13 DIAGNOSIS — M25552 Pain in left hip: Secondary | ICD-10-CM | POA: Diagnosis not present

## 2020-04-13 MED ORDER — SODIUM CHLORIDE 0.9% FLUSH
10.0000 mL | Freq: Once | INTRAVENOUS | Status: AC
  Administered 2020-04-13: 10 mL
  Filled 2020-04-13: qty 10

## 2020-04-13 MED ORDER — HEPARIN SOD (PORK) LOCK FLUSH 100 UNIT/ML IV SOLN
500.0000 [IU] | Freq: Once | INTRAVENOUS | Status: AC
  Administered 2020-04-13: 500 [IU]
  Filled 2020-04-13: qty 5

## 2020-04-13 NOTE — Therapy (Signed)
[place], [place], [address] Phone: [phone]   Fax:  [phone]  Physical Therapy Treatment  Patient Details  Name: [name] [name] MRN: [id number] Date of Birth: 1941/08/10 Referring Provider (PT): Rodell Perna   Encounter Date: 04/13/2020   PT End of Session - 04/13/20 1556    Visit Number 4    Date for PT Re-Evaluation 06/01/20    PT Start Time 1520    PT Stop Time 1559    PT Time Calculation (min) 39 min    Behavior During Therapy [facility] for tasks assessed/performed           Past Medical History:  Diagnosis Date   Basal cell carcinoma 06/08/1998   Upper lip - CX3+5FU   Basal cell carcinoma 06/22/1998   Left front neck - CX3+5FU   Basal cell carcinoma 03/11/2004   Right temple [facility])   Basal cell carcinoma 04/24/2016   Left top shoulder - Clear   Basal cell carcinoma 01/14/2018   Right sideburn - CX3+5FU   Femur fracture ([facility])    Hip fracture ([place])    Hypertension    Malignant neoplasm of pancreas, part unspecified    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   Right Ear Superior   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   Right Sideburn   Other malignant lymphomas, unspecified site, extranodal and solid organ sites    Squamous cell carcinoma of skin 09/26/2001   Left forehead - CX3+excision   Squamous cell carcinoma of skin 03/11/2004   Post right scalp - CX3+5FU   Squamous cell carcinoma of skin 03/11/2004   Left forehead (MOHs)   Squamous cell carcinoma of skin 03/11/2004   Right sideburn - CX3+5FU+excision   Squamous cell carcinoma of skin 12/01/2004   Left temple - CX3+excision   Squamous cell carcinoma of skin 04/13/2006   Mid scalp, sup - tx p bx   Squamous cell carcinoma of skin 04/13/2006   Mid scalp, inf - tx p bx   Squamous cell carcinoma of skin 04/13/2006   Left temple - tx p bx   Squamous cell  carcinoma of skin 06/30/2010   Left low back - tx p bx   Squamous cell carcinoma of skin 06/23/2013   Front scalp - CX3+5FU   Squamous cell carcinoma of skin 01/14/2018   Left scalp - CX3+5FU    Past Surgical History:  Procedure Laterality Date   FEMUR IM NAIL Left 10/06/2019   Procedure: INTRAMEDULLARY (IM) RETROGRADE FEMORAL NAIL REMOVAL ( [employer]);  Surgeon: Marybelle Killings, MD;  Location: [place];  Service: Orthopedics;  Laterality: Left;   FEMUR SURGERY Bilateral    INTRAMEDULLARY (IM) NAIL INTERTROCHANTERIC Left 10/06/2019   Procedure: BIOMET AFFIXUS SHORT NAIL,  INTERTROCHANTRIC;  Surgeon: Marybelle Killings, MD;  Location: [place];  Service: Orthopedics;  Laterality: Left;   LEG SURGERY Left    SHOULDER SURGERY Left     There were no vitals filed for this visit.   Subjective Assessment - 04/13/20 1526    Subjective It has not been too bad of a day    Currently in Pain? Yes    Pain Score 4     Pain Location Knee   L shoulder 3/10   Pain Orientation Left  [facility] Adult PT Treatment/Exercise - 04/13/20 0001      High Level Balance   High Level Balance Activities Backward walking;Marching forwards;Side stepping      Knee/Hip Exercises: Aerobic   Nustep L5 x 6 min      Knee/Hip Exercises: Standing   Hip Extension 2 sets;10 reps;Both;Knee straight    Other Standing Knee Exercises Standing march SPC 2x5       Knee/Hip Exercises: Seated   Ball Squeeze 2x10 with 3 sec hold    Sit to Sand 2 sets;without UE support;5 reps   from blue chair                    PT Short Term Goals - 03/31/20 1758      PT SHORT TERM GOAL #1   Title pt will be I with inital HEP )    Time 2    Period Weeks    Status New    Target Date 04/14/20             PT Long Term Goals - 04/13/20 1532      PT LONG TERM GOAL #2   Title Pt will demonstrate STS x5 with UE support on thighs from chair    Status Achieved                  Plan - 04/13/20 1556    Clinical Impression Statement Pt has progressed completing his 5 time sit to stand goal from chair without UE use. All balance interventions performed with SPC except for side step. He did become fatigue with side step needing SBA to CGA. Visible bilateral hip weakness noted with extensions. He ambulated with a WBOS    Personal Factors and Comorbidities Comorbidity 1    Comorbidities HTN    Examination-Activity Limitations Bend;Lift;Stand;Stairs;Squat;Locomotion Level    Examination-Participation Restrictions Community Activity;Interpersonal Relationship    Stability/Clinical Decision Making Stable/Uncomplicated    Rehab Potential Good    PT Frequency 2x / week    PT Duration 6 weeks    PT Treatment/Interventions ADLs/Self Care Home Management;Electrical Stimulation;Iontophoresis 4mg /ml Dexamethasone;Moist Heat;Neuromuscular re-education;Balance training;Therapeutic exercise;Therapeutic activities;Functional mobility training;Stair training;Gait training;Patient/family education;Manual techniques;Passive range of motion    PT Next Visit Plan TUG, gait with LRAD, LE strengthening/flexibility, manual as indicated           Patient will benefit from skilled therapeutic intervention in order to improve the following deficits and impairments:  Abnormal gait, Decreased range of motion, Difficulty walking, Decreased endurance, Decreased safety awareness, Decreased activity tolerance, Pain, Decreased balance, Hypomobility, Decreased strength  Visit Diagnosis: Muscle weakness (generalized)  Pain in left hip  Difficulty in walking, not elsewhere classified     Problem List Patient Active Problem List   Diagnosis Date Noted   Multiple facial fractures, closed, initial encounter ([place]) 02/04/2020   Closed left hip fracture ([place]) 10/04/2019   Essential hypertension 10/04/2019   Fall    Port-A-Cath in place 02/04/2019   Thrombocytopenia ([place])  01/22/2018   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

## 2020-04-14 ENCOUNTER — Telehealth: Payer: Self-pay | Admitting: Dermatology

## 2020-04-14 NOTE — Telephone Encounter (Signed)
Phone call to patient to see what was going on with his biopsy sites. Per patient they just seem to be healing slow but do not seem infected. Patient will continue vasaline and then call with an update on Friday if they still are not better we will try to arrange for him to come in for wound check.

## 2020-04-14 NOTE — Telephone Encounter (Signed)
Patient is calling saying that biopsy site is not healing, but does not look infected as far as he can tell.  What can he do?

## 2020-04-15 ENCOUNTER — Ambulatory Visit: Payer: Medicare HMO | Admitting: Physical Therapy

## 2020-04-15 ENCOUNTER — Encounter: Payer: Self-pay | Admitting: Physical Therapy

## 2020-04-15 ENCOUNTER — Other Ambulatory Visit: Payer: Self-pay

## 2020-04-15 DIAGNOSIS — M25552 Pain in left hip: Secondary | ICD-10-CM

## 2020-04-15 DIAGNOSIS — M6281 Muscle weakness (generalized): Secondary | ICD-10-CM

## 2020-04-15 DIAGNOSIS — R262 Difficulty in walking, not elsewhere classified: Secondary | ICD-10-CM | POA: Diagnosis not present

## 2020-04-15 NOTE — Therapy (Signed)
[place] [address] [place], [place], [address] Phone: [phone]   Fax:  [phone]  Physical Therapy Treatment  Patient Details  Name: [name] [name] MRN: [id number] Date of Birth: [birth year] Referring Provider (PT): Rodell Perna   Encounter Date: 04/15/2020   PT End of Session - 04/15/20 1555    Visit Number 5    Date for PT Re-Evaluation 06/01/20    PT Start Time 0350    PT Stop Time 1557    PT Time Calculation (min) 42 min    Activity Tolerance Patient tolerated treatment well    Behavior During Therapy [facility] for tasks assessed/performed           Past Medical History:  Diagnosis Date  . Basal cell carcinoma 06/08/1998   Upper lip - CX3+5FU  . Basal cell carcinoma 06/22/1998   Left front neck - CX3+5FU  . Basal cell carcinoma 03/11/2004   Right temple [facility])  . Basal cell carcinoma 04/24/2016   Left top shoulder - Clear  . Basal cell carcinoma 01/14/2018   Right sideburn - CX3+5FU  . Femur fracture ([place])   . Hip fracture ([place])   . Hypertension   . Malignant neoplasm of pancreas, part unspecified   . 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 Other malignant lymphomas, unspecified site, extranodal and solid organ sites   . Squamous cell carcinoma of skin 09/26/2001   Left forehead - CX3+excision  . Squamous cell carcinoma of skin 03/11/2004   Post right scalp - CX3+5FU  . Squamous cell carcinoma of skin 03/11/2004   Left forehead (MOHs)  . Squamous cell carcinoma of skin 03/11/2004   Right sideburn - CX3+5FU+excision  . Squamous cell carcinoma of skin 12/01/2004   Left temple - CX3+excision  . Squamous cell carcinoma of skin 04/13/2006   Mid scalp, sup - tx p bx  . Squamous cell carcinoma of skin 04/13/2006   Mid scalp, inf - tx p bx  . Squamous cell carcinoma of skin  04/13/2006   Left temple - tx p bx  . Squamous cell carcinoma of skin 06/30/2010   Left low back - tx p bx  . Squamous cell carcinoma of skin 06/23/2013   Front scalp - CX3+5FU  . Squamous cell carcinoma of skin 01/14/2018   Left scalp - CX3+5FU    Past Surgical History:  Procedure Laterality Date  . FEMUR IM NAIL Left 10/06/2019   Procedure: INTRAMEDULLARY (IM) RETROGRADE FEMORAL NAIL REMOVAL ( [employer]);  Surgeon: Marybelle Killings, MD;  Location: [place];  Service: Orthopedics;  Laterality: Left;  . FEMUR SURGERY Bilateral   . INTRAMEDULLARY (IM) NAIL INTERTROCHANTERIC Left 10/06/2019   Procedure: BIOMET AFFIXUS SHORT NAIL,  INTERTROCHANTRIC;  Surgeon: Marybelle Killings, MD;  Location: [place];  Service: Orthopedics;  Laterality: Left;  . LEG SURGERY Left   . SHOULDER SURGERY Left     There were no vitals filed for this visit.   Subjective Assessment - 04/15/20 1520    Subjective Feeling pretty good, knees hurt a little on NuStep    Currently in Pain? Yes    Pain Score 3     Pain Location Knee    Pain Orientation Left  [place] Adult PT Treatment/Exercise - 04/15/20 0001      High Level Balance   High Level Balance Activities Side stepping;Backward walking;Marching forwards    High Level Balance Comments SPC with marches      Knee/Hip Exercises: Aerobic   Recumbent Bike L1 x 50min     Nustep L5 x 6 min      Knee/Hip Exercises: Standing   Hip ADduction Strengthening;Both;10 reps;2 sets      Knee/Hip Exercises: Seated   Sit to Sand 5 reps;without UE support;2 sets   blue chair, slow decents.                   PT Short Term Goals - 03/31/20 1758      PT SHORT TERM GOAL #1   Title pt will be I with inital HEP )    Time 2    Period Weeks    Status New    Target Date 04/14/20             PT Long Term Goals - 04/13/20 1532      PT LONG TERM GOAL #2   Title Pt will demonstrate STS x5 with UE support on thighs  from chair    Status Achieved                 Plan - 04/15/20 1556    Clinical Impression Statement progressed with backwards walking without SPC. Pt able to progressed to recumbent bike for aerobic warm up. Hip dip noted with side step. Pt repots some dizziness doing side step and asked for his BP to be taken BP was 150/90 mmhg. He stated that it has been running hight lately. only one bout of dizziness reported.    Personal Factors and Comorbidities Comorbidity 1    Examination-Activity Limitations Bend;Lift;Stand;Stairs;Squat;Locomotion Level    Examination-Participation Restrictions Community Activity;Interpersonal Relationship    Stability/Clinical Decision Making Stable/Uncomplicated    Rehab Potential Good    PT Frequency 2x / week    PT Duration 6 weeks    PT Treatment/Interventions ADLs/Self Care Home Management;Electrical Stimulation;Iontophoresis 4mg /ml Dexamethasone;Moist Heat;Neuromuscular re-education;Balance training;Therapeutic exercise;Therapeutic activities;Functional mobility training;Stair training;Gait training;Patient/family education;Manual techniques;Passive range of motion    PT Next Visit Plan TUG, gait with LRAD, LE strengthening/flexibility, manual as indicated           Patient will benefit from skilled therapeutic intervention in order to improve the following deficits and impairments:  Abnormal gait, Decreased range of motion, Difficulty walking, Decreased endurance, Decreased safety awareness, Decreased activity tolerance, Pain, Decreased balance, Hypomobility, Decreased strength  Visit Diagnosis: Muscle weakness (generalized)  Pain in left hip     Problem List Patient Active Problem List   Diagnosis Date Noted  . Multiple facial fractures, closed, initial encounter ([place]) 02/04/2020  . Closed left hip fracture ([place]) 10/04/2019  . Essential hypertension 10/04/2019  . Fall   . Port-A-Cath in place 02/04/2019  . Thrombocytopenia ([place])  01/22/2018  . 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 History of arthroscopy of left shoulder 07/12/2016  . Port catheter in place 02/22/2016  . 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 Vitamin D deficiency 11/21/2012  . Lymphoma ([place]) 06/13/2012    Scot Jun, PTA 04/15/2020, 4:00 PM  [address] [place], [place], [address] Phone: [phone]   Fax:  [phone]  Name: Jai  [name] [name] MRN: [id number] Date of Birth: 02/28/1941

## 2020-04-20 ENCOUNTER — Other Ambulatory Visit: Payer: Self-pay

## 2020-04-20 ENCOUNTER — Encounter: Payer: Self-pay | Admitting: Physical Therapy

## 2020-04-20 ENCOUNTER — Ambulatory Visit: Payer: Medicare HMO | Attending: Orthopaedic Surgery | Admitting: Physical Therapy

## 2020-04-20 DIAGNOSIS — M6281 Muscle weakness (generalized): Secondary | ICD-10-CM | POA: Insufficient documentation

## 2020-04-20 DIAGNOSIS — R262 Difficulty in walking, not elsewhere classified: Secondary | ICD-10-CM | POA: Diagnosis not present

## 2020-04-20 DIAGNOSIS — M25552 Pain in left hip: Secondary | ICD-10-CM | POA: Insufficient documentation

## 2020-04-20 NOTE — Therapy (Signed)
[name], [place], [address] Phone: [phone]   Fax:  [phone]  Physical Therapy Treatment  Patient Details  Name: [name] [name] MRN: [id number] Date of Birth: Jun 16, 1941 Referring Provider (PT): Rodell Perna   Encounter Date: 04/20/2020   PT End of Session - 04/20/20 1616    Visit Number 6    Date for PT Re-Evaluation 06/01/20    PT Start Time 3704    PT Stop Time 1615    PT Time Calculation (min) 45 min    Activity Tolerance Patient tolerated treatment well    Behavior During Therapy [facility] for tasks assessed/performed           Past Medical History:  Diagnosis Date  . Basal cell carcinoma 06/08/1998   Upper lip - CX3+5FU  . Basal cell carcinoma 06/22/1998   Left front neck - CX3+5FU  . Basal cell carcinoma 03/11/2004   Right temple [facility])  . Basal cell carcinoma 04/24/2016   Left top shoulder - Clear  . Basal cell carcinoma 01/14/2018   Right sideburn - CX3+5FU  . Femur fracture ([place])   . Hip fracture ([place])   . Hypertension   . Malignant neoplasm of pancreas, part unspecified   . 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 Other malignant lymphomas, unspecified site, extranodal and solid organ sites   . Squamous cell carcinoma of skin 09/26/2001   Left forehead - CX3+excision  . Squamous cell carcinoma of skin 03/11/2004   Post right scalp - CX3+5FU  . Squamous cell carcinoma of skin 03/11/2004   Left forehead (MOHs)  . Squamous cell carcinoma of skin 03/11/2004   Right sideburn - CX3+5FU+excision  . Squamous cell carcinoma of skin 12/01/2004   Left temple - CX3+excision  . Squamous cell carcinoma of skin 04/13/2006   Mid scalp, sup - tx p bx  . Squamous cell carcinoma of skin 04/13/2006   Mid scalp, inf - tx p bx  . Squamous cell carcinoma of skin  04/13/2006   Left temple - tx p bx  . Squamous cell carcinoma of skin 06/30/2010   Left low back - tx p bx  . Squamous cell carcinoma of skin 06/23/2013   Front scalp - CX3+5FU  . Squamous cell carcinoma of skin 01/14/2018   Left scalp - CX3+5FU    Past Surgical History:  Procedure Laterality Date  . FEMUR IM NAIL Left 10/06/2019   Procedure: INTRAMEDULLARY (IM) RETROGRADE FEMORAL NAIL REMOVAL ( [employer]);  Surgeon: Marybelle Killings, MD;  Location: [place];  Service: Orthopedics;  Laterality: Left;  . FEMUR SURGERY Bilateral   . INTRAMEDULLARY (IM) NAIL INTERTROCHANTERIC Left 10/06/2019   Procedure: BIOMET AFFIXUS SHORT NAIL,  INTERTROCHANTRIC;  Surgeon: Marybelle Killings, MD;  Location: [place];  Service: Orthopedics;  Laterality: Left;  . LEG SURGERY Left   . SHOULDER SURGERY Left     There were no vitals filed for this visit.   Subjective Assessment - 04/20/20 1545    Subjective Pt states he is feeling pretty good    Currently in Pain? Yes    Pain Score 2     Pain Location Knee    Pain Orientation Left  [place] Adult PT Treatment/Exercise - 04/20/20 0001      Knee/Hip Exercises: Aerobic   Recumbent Bike L1 x 6 min    Nustep L5 x 7 min      Knee/Hip Exercises: Standing   Hip Flexion Both;2 sets;10 reps    Hip Flexion Limitations 2    Hip Abduction Stengthening;2 sets;10 reps    Abduction Limitations 2    Hip Extension 2 sets;10 reps;Both;Knee straight    Extension Limitations 2    Other Standing Knee Exercises standing march SPC with 4" box alt taps      Knee/Hip Exercises: Seated   Long Arc Quad Both;10 reps;2 sets    Ball Squeeze 2x10 with 3 sec hold    Marching Both;2 sets;10 reps    Marching Weights 2 lbs.    Sit to Sand 5 reps;without UE support;2 sets   cues for controlled descent                   PT Short Term Goals - 03/31/20 1758      PT SHORT TERM GOAL #1   Title pt will be I with inital HEP )     Time 2    Period Weeks    Status New    Target Date 04/14/20             PT Long Term Goals - 04/13/20 1532      PT LONG TERM GOAL #2   Title Pt will demonstrate STS x5 with UE support on thighs from chair    Status Achieved                 Plan - 04/20/20 1618    Clinical Impression Statement Pt required CGA-minA for alt step taps with SPC d/t occasional LOB; reduced stair height from 6" to 4". Pt required cuing to avoid excessive lateral lean with step taps. Pt ambulating with widened BOS and cues for LE foot clearance and increased hip flexion.    PT Treatment/Interventions ADLs/Self Care Home Management;Electrical Stimulation;Iontophoresis 4mg /ml Dexamethasone;Moist Heat;Neuromuscular re-education;Balance training;Therapeutic exercise;Therapeutic activities;Functional mobility training;Stair training;Gait training;Patient/family education;Manual techniques;Passive range of motion    PT Next Visit Plan TUG, gait with LRAD, LE strengthening/flexibility, manual as indicated    Consulted and Agree with Plan of Care Patient           Patient will benefit from skilled therapeutic intervention in order to improve the following deficits and impairments:  Abnormal gait, Decreased range of motion, Difficulty walking, Decreased endurance, Decreased safety awareness, Decreased activity tolerance, Pain, Decreased balance, Hypomobility, Decreased strength  Visit Diagnosis: Muscle weakness (generalized)  Pain in left hip  Difficulty in walking, not elsewhere classified     Problem List Patient Active Problem List   Diagnosis Date Noted  . Multiple facial fractures, closed, initial encounter ([place]) 02/04/2020  . Closed left hip fracture ([place]) 10/04/2019  . Essential hypertension 10/04/2019  . Fall   . Port-A-Cath in place 02/04/2019  . Thrombocytopenia ([place]) 01/22/2018  . 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 History of arthroscopy of left shoulder 07/12/2016  . Port catheter in place 02/22/2016  . 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 Vitamin D deficiency 11/21/2012  . Lymphoma ([place]) 06/13/2012   Amador Cunas, PT, DPT Donald Prose Madason Rauls 04/20/2020, 4:23 PM  [place] [phone]  [place], [place], [address] Phone: [phone]   Fax:  [phone]  Name: ZADOK HOLAWAY MRN: [id number] Date of Birth: 17-Sep-1941

## 2020-04-22 ENCOUNTER — Ambulatory Visit: Payer: Medicare HMO | Admitting: Physical Therapy

## 2020-04-27 ENCOUNTER — Other Ambulatory Visit: Payer: Self-pay

## 2020-04-27 ENCOUNTER — Ambulatory Visit: Payer: Medicare HMO | Admitting: Physical Therapy

## 2020-04-27 ENCOUNTER — Encounter: Payer: Self-pay | Admitting: Physical Therapy

## 2020-04-27 DIAGNOSIS — R262 Difficulty in walking, not elsewhere classified: Secondary | ICD-10-CM | POA: Diagnosis not present

## 2020-04-27 DIAGNOSIS — M6281 Muscle weakness (generalized): Secondary | ICD-10-CM

## 2020-04-27 DIAGNOSIS — M25552 Pain in left hip: Secondary | ICD-10-CM | POA: Diagnosis not present

## 2020-04-27 NOTE — Therapy (Signed)
[place] [address] [place], [place], [address] Phone: [phone]   Fax:  [phone]  Physical Therapy Treatment  Patient Details  Name: [name] [name] MRN: [id number] Date of Birth: 1941/04/12 Referring Provider (PT): Rodell Perna   Encounter Date: 04/27/2020   PT End of Session - 04/27/20 1556    Visit Number 7    Date for PT Re-Evaluation 06/01/20    PT Start Time 3329    PT Stop Time 1556    PT Time Calculation (min) 41 min    Activity Tolerance Patient tolerated treatment well    Behavior During Therapy [facility] for tasks assessed/performed           Past Medical History:  Diagnosis Date  . Basal cell carcinoma 06/08/1998   Upper lip - CX3+5FU  . Basal cell carcinoma 06/22/1998   Left front neck - CX3+5FU  . Basal cell carcinoma 03/11/2004   Right temple [facility])  . Basal cell carcinoma 04/24/2016   Left top shoulder - Clear  . Basal cell carcinoma 01/14/2018   Right sideburn - CX3+5FU  . Femur fracture ([place])   . Hip fracture ([place])   . Hypertension   . Malignant neoplasm of pancreas, part unspecified   . 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 Other malignant lymphomas, unspecified site, extranodal and solid organ sites   . Squamous cell carcinoma of skin 09/26/2001   Left forehead - CX3+excision  . Squamous cell carcinoma of skin 03/11/2004   Post right scalp - CX3+5FU  . Squamous cell carcinoma of skin 03/11/2004   Left forehead (MOHs)  . Squamous cell carcinoma of skin 03/11/2004   Right sideburn - CX3+5FU+excision  . Squamous cell carcinoma of skin 12/01/2004   Left temple - CX3+excision  . Squamous cell carcinoma of skin 04/13/2006   Mid scalp, sup - tx p bx  . Squamous cell carcinoma of skin 04/13/2006   Mid scalp, inf - tx p bx  . Squamous cell carcinoma of skin  04/13/2006   Left temple - tx p bx  . Squamous cell carcinoma of skin 06/30/2010   Left low back - tx p bx  . Squamous cell carcinoma of skin 06/23/2013   Front scalp - CX3+5FU  . Squamous cell carcinoma of skin 01/14/2018   Left scalp - CX3+5FU    Past Surgical History:  Procedure Laterality Date  . FEMUR IM NAIL Left 10/06/2019   Procedure: INTRAMEDULLARY (IM) RETROGRADE FEMORAL NAIL REMOVAL ( [employer]);  Surgeon: Marybelle Killings, MD;  Location: [place];  Service: Orthopedics;  Laterality: Left;  . FEMUR SURGERY Bilateral   . INTRAMEDULLARY (IM) NAIL INTERTROCHANTERIC Left 10/06/2019   Procedure: BIOMET AFFIXUS SHORT NAIL,  INTERTROCHANTRIC;  Surgeon: Marybelle Killings, MD;  Location: [place];  Service: Orthopedics;  Laterality: Left;  . LEG SURGERY Left   . SHOULDER SURGERY Left     There were no vitals filed for this visit.   Subjective Assessment - 04/27/20 1519    Subjective Doing pretty good, Im not hurting    Currently in Pain? No/denies              [facility] PT Assessment - 04/27/20 0001      Standardized Balance Assessment   Standardized Balance Assessment Timed Up and Go Test      Timed  Up and Go Test   Normal TUG (seconds) 14.88    TUG Comments SPC RUE                          [facility] Adult PT Treatment/Exercise - 04/27/20 0001      Knee/Hip Exercises: Aerobic   Recumbent Bike L1 x 6 min    Nustep L2 & L1 x 5 min      Knee/Hip Exercises: Machines for Strengthening   Cybex Knee Extension 5lb 2x15     Cybex Knee Flexion 25lb 2x10, SL 15lb x 10 each       Knee/Hip Exercises: Standing   Heel Raises Both;2 sets;10 reps;1 second    Forward Step Up Both;1 set;10 reps;Hand Hold: 1;Step Height: 4"      Knee/Hip Exercises: Seated   Sit to Sand 2 sets;10 reps;without UE support   airex on mat                    PT Short Term Goals - 03/31/20 1758      PT SHORT TERM GOAL #1   Title pt will be I with inital HEP )    Time 2    Period Weeks     Status New    Target Date 04/14/20             PT Long Term Goals - 04/27/20 1535      PT LONG TERM GOAL #1   Title Pt will demonstrate TUG with LRAD <15 sec    Status Achieved   14.88 seconds                Plan - 04/27/20 1556    Clinical Impression Statement Pt has progressed meeting TUG goal with SPC. He reports decrease pain overall. Some Instability and LLE weakness L>R with step ups. Still ambulated with wide BOS and decrease LE clearance at times. He can correct clearance issues with cues. LE fatigue with seated curls and extensions.    Personal Factors and Comorbidities Comorbidity 1    Examination-Activity Limitations Bend;Lift;Stand;Stairs;Squat;Locomotion Level    Examination-Participation Restrictions Community Activity;Interpersonal Relationship    Stability/Clinical Decision Making Stable/Uncomplicated    Rehab Potential Good    PT Frequency 2x / week    PT Duration 6 weeks    PT Treatment/Interventions ADLs/Self Care Home Management;Electrical Stimulation;Iontophoresis 4mg /ml Dexamethasone;Moist Heat;Neuromuscular re-education;Balance training;Therapeutic exercise;Therapeutic activities;Functional mobility training;Stair training;Gait training;Patient/family education;Manual techniques;Passive range of motion    PT Next Visit Plan gait with LRAD, LE strengthening/flexibility, manual as indicated           Patient will benefit from skilled therapeutic intervention in order to improve the following deficits and impairments:  Abnormal gait, Decreased range of motion, Difficulty walking, Decreased endurance, Decreased safety awareness, Decreased activity tolerance, Pain, Decreased balance, Hypomobility, Decreased strength  Visit Diagnosis: Muscle weakness (generalized)  Pain in left hip  Difficulty in walking, not elsewhere classified     Problem List Patient Active Problem List   Diagnosis Date Noted  . Multiple facial fractures, closed, initial  encounter ([place]) 02/04/2020  . Closed left hip fracture ([place]) 10/04/2019  . Essential hypertension 10/04/2019  . Fall   . Port-A-Cath in place 02/04/2019  . Thrombocytopenia ([place]) 01/22/2018  . 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 History of arthroscopy of left shoulder 07/12/2016  . Port catheter in place 02/22/2016  . Diffuse follicle center  lymphoma of lymph nodes of neck ([place]) 12/28/2015  . Vitamin D deficiency 11/21/2012  . Lymphoma ([place]) 06/13/2012    Scot Jun, PTA 04/27/2020, 3:59 PM  [place] [address] [place], [place], [address] Phone: [phone]   Fax:  [phone]  Name: [name] [name] MRN: [id number] Date of Birth: 1941/03/10

## 2020-04-29 ENCOUNTER — Ambulatory Visit: Payer: Medicare HMO | Admitting: Physical Therapy

## 2020-04-29 ENCOUNTER — Encounter: Payer: Self-pay | Admitting: Physical Therapy

## 2020-04-29 ENCOUNTER — Other Ambulatory Visit: Payer: Self-pay

## 2020-04-29 DIAGNOSIS — M6281 Muscle weakness (generalized): Secondary | ICD-10-CM

## 2020-04-29 DIAGNOSIS — M25552 Pain in left hip: Secondary | ICD-10-CM | POA: Diagnosis not present

## 2020-04-29 DIAGNOSIS — R262 Difficulty in walking, not elsewhere classified: Secondary | ICD-10-CM

## 2020-04-29 NOTE — Therapy (Signed)
[place], [place], [address] Phone: [phone]   Fax:  [phone]  Physical Therapy Treatment  Patient Details  Name: [name] [name] MRN: [id number] Date of Birth: 1941-03-20 Referring Provider (PT): Rodell Perna   Encounter Date: 04/29/2020   PT End of Session - 04/29/20 1600    Visit Number 8    Date for PT Re-Evaluation 06/01/20    PT Start Time 1520    PT Stop Time 1600    PT Time Calculation (min) 40 min    Activity Tolerance Patient tolerated treatment well    Behavior During Therapy [facility] for tasks assessed/performed           Past Medical History:  Diagnosis Date   Basal cell carcinoma 06/08/1998   Upper lip - CX3+5FU   Basal cell carcinoma 06/22/1998   Left front neck - CX3+5FU   Basal cell carcinoma 03/11/2004   Right temple [facility])   Basal cell carcinoma 04/24/2016   Left top shoulder - Clear   Basal cell carcinoma 01/14/2018   Right sideburn - CX3+5FU   Femur fracture (HCC)    Hip fracture ([place])    Hypertension    Malignant neoplasm of pancreas, part unspecified    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   Right Ear Superior   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   Right Sideburn   Other malignant lymphomas, unspecified site, extranodal and solid organ sites    Squamous cell carcinoma of skin 09/26/2001   Left forehead - CX3+excision   Squamous cell carcinoma of skin 03/11/2004   Post right scalp - CX3+5FU   Squamous cell carcinoma of skin 03/11/2004   Left forehead (MOHs)   Squamous cell carcinoma of skin 03/11/2004   Right sideburn - CX3+5FU+excision   Squamous cell carcinoma of skin 12/01/2004   Left temple - CX3+excision   Squamous cell carcinoma of skin 04/13/2006   Mid scalp, sup - tx p bx   Squamous cell carcinoma of skin 04/13/2006   Mid scalp, inf - tx p bx   Squamous cell carcinoma of skin  04/13/2006   Left temple - tx p bx   Squamous cell carcinoma of skin 06/30/2010   Left low back - tx p bx   Squamous cell carcinoma of skin 06/23/2013   Front scalp - CX3+5FU   Squamous cell carcinoma of skin 01/14/2018   Left scalp - CX3+5FU    Past Surgical History:  Procedure Laterality Date   FEMUR IM NAIL Left 10/06/2019   Procedure: INTRAMEDULLARY (IM) RETROGRADE FEMORAL NAIL REMOVAL ( [employer]);  Surgeon: Marybelle Killings, MD;  Location: [place];  Service: Orthopedics;  Laterality: Left;   FEMUR SURGERY Bilateral    INTRAMEDULLARY (IM) NAIL INTERTROCHANTERIC Left 10/06/2019   Procedure: BIOMET AFFIXUS SHORT NAIL,  INTERTROCHANTRIC;  Surgeon: Marybelle Killings, MD;  Location: [place];  Service: Orthopedics;  Laterality: Left;   LEG SURGERY Left    SHOULDER SURGERY Left     There were no vitals filed for this visit.   Subjective Assessment - 04/29/20 1523    Subjective Not hurting today.    Pertinent History prior fx to distal femur after paragliding accident    Currently in Pain? No/denies                             [facility] Adult PT Treatment/Exercise -  04/29/20 0001      High Level Balance   High Level Balance Comments Side step over half pool noodle       Knee/Hip Exercises: Aerobic   Nustep L5 x 5 min      Knee/Hip Exercises: Standing   Lateral Step Up Both;1 set;Hand Hold: 1;Step Height: 4"   3 reps    Forward Step Up Both;2 sets;5 sets;Step Height: 6";Hand Hold: 2    Other Standing Knee Exercises Standing march 3lb 2 sets 10 reps wiht RW and SPC      Knee/Hip Exercises: Seated   Sit to Sand 2 sets;5 reps;without UE support   elevated UBE adductor ball squeeze                   PT Short Term Goals - 03/31/20 1758      PT SHORT TERM GOAL #1   Title pt will be I with inital HEP )    Time 2    Period Weeks    Status New    Target Date 04/14/20             PT Long Term Goals - 04/27/20 1535      PT LONG TERM GOAL #1    Title Pt will demonstrate TUG with LRAD <15 sec    Status Achieved   14.88 seconds                Plan - 04/29/20 1601    Clinical Impression Statement Increase resistance tolerated with standing marches but was taxing on patient. Pt ambulates and stands with wide base of support so he does have some difficulty with side step interventions. Some instability with lateral step ups going to his L more so than his R. Some quad weakness reported with forward step ups.    Personal Factors and Comorbidities Comorbidity 1    Comorbidities HTN    Examination-Activity Limitations Bend;Lift;Stand;Stairs;Squat;Locomotion Level    Stability/Clinical Decision Making Stable/Uncomplicated    Rehab Potential Good    PT Frequency 2x / week    PT Treatment/Interventions ADLs/Self Care Home Management;Electrical Stimulation;Iontophoresis 4mg /ml Dexamethasone;Moist Heat;Neuromuscular re-education;Balance training;Therapeutic exercise;Therapeutic activities;Functional mobility training;Stair training;Gait training;Patient/family education;Manual techniques;Passive range of motion    PT Next Visit Plan gait with LRAD, LE strengthening/flexibility, manual as indicated           Patient will benefit from skilled therapeutic intervention in order to improve the following deficits and impairments:  Abnormal gait, Decreased range of motion, Difficulty walking, Decreased endurance, Decreased safety awareness, Decreased activity tolerance, Pain, Decreased balance, Hypomobility, Decreased strength  Visit Diagnosis: Difficulty in walking, not elsewhere classified  Pain in left hip  Muscle weakness (generalized)     Problem List Patient Active Problem List   Diagnosis Date Noted   Multiple facial fractures, closed, initial encounter ([place]) 02/04/2020   Closed left hip fracture ([place]) 10/04/2019   Essential hypertension 10/04/2019   Fall    Port-A-Cath in place 02/04/2019   Thrombocytopenia ([place])  01/22/2018   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

## 2020-05-04 ENCOUNTER — Ambulatory Visit: Payer: Medicare HMO | Admitting: Physical Therapy

## 2020-05-04 DIAGNOSIS — H3582 Retinal ischemia: Secondary | ICD-10-CM | POA: Diagnosis not present

## 2020-05-04 DIAGNOSIS — H34813 Central retinal vein occlusion, bilateral, with macular edema: Secondary | ICD-10-CM | POA: Diagnosis not present

## 2020-05-04 DIAGNOSIS — H43813 Vitreous degeneration, bilateral: Secondary | ICD-10-CM | POA: Diagnosis not present

## 2020-05-04 DIAGNOSIS — H35372 Puckering of macula, left eye: Secondary | ICD-10-CM | POA: Diagnosis not present

## 2020-05-06 ENCOUNTER — Ambulatory Visit: Payer: Medicare HMO | Admitting: Physical Therapy

## 2020-05-11 ENCOUNTER — Ambulatory Visit: Payer: Medicare HMO | Admitting: Physical Therapy

## 2020-05-11 ENCOUNTER — Other Ambulatory Visit: Payer: Self-pay

## 2020-05-11 ENCOUNTER — Encounter: Payer: Self-pay | Admitting: Physical Therapy

## 2020-05-11 DIAGNOSIS — M25552 Pain in left hip: Secondary | ICD-10-CM

## 2020-05-11 DIAGNOSIS — R262 Difficulty in walking, not elsewhere classified: Secondary | ICD-10-CM

## 2020-05-11 DIAGNOSIS — M6281 Muscle weakness (generalized): Secondary | ICD-10-CM

## 2020-05-11 NOTE — Patient Instructions (Signed)
Access Code: [email or web address] URL: [email or web address] Date: 05/11/2020 Prepared by: Amador Cunas  Exercises Sit to Stand - 1 x daily - 7 x weekly - 3 sets - 10 reps Standing Hip Abduction with Counter Support - 1 x daily - 7 x weekly - 3 sets - 10 reps Standing Hip Extension with Counter Support - 1 x daily - 7 x weekly - 3 sets - 10 reps Heel rises with counter support - 1 x daily - 7 x weekly - 3 sets - 10 reps Standing March with Counter Support - 1 x daily - 7 x weekly - 3 sets - 10 reps Standing Knee Flexion with Counter Support - 1 x daily - 7 x weekly - 3 sets - 10 reps

## 2020-05-11 NOTE — Therapy (Signed)
[place], [place], [address] Phone: [phone]   Fax:  [phone]  Physical Therapy Treatment  Patient Details  Name: [name] [name] MRN: [id number] Date of Birth: 01/28/1941 Referring Provider (PT): Rodell Perna   Encounter Date: 05/11/2020   PT End of Session - 05/11/20 1614    Visit Number 9    Date for PT Re-Evaluation 06/01/20    PT Start Time 1526    PT Stop Time 1610    PT Time Calculation (min) 44 min    Activity Tolerance Patient tolerated treatment well    Behavior During Therapy [facility] for tasks assessed/performed           Past Medical History:  Diagnosis Date  . Basal cell carcinoma 06/08/1998   Upper lip - CX3+5FU  . Basal cell carcinoma 06/22/1998   Left front neck - CX3+5FU  . Basal cell carcinoma 03/11/2004   Right temple [facility])  . Basal cell carcinoma 04/24/2016   Left top shoulder - Clear  . Basal cell carcinoma 01/14/2018   Right sideburn - CX3+5FU  . Femur fracture ([place])   . Hip fracture ([place])   . Hypertension   . Malignant neoplasm of pancreas, part unspecified   . 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 Other malignant lymphomas, unspecified site, extranodal and solid organ sites   . Squamous cell carcinoma of skin 09/26/2001   Left forehead - CX3+excision  . Squamous cell carcinoma of skin 03/11/2004   Post right scalp - CX3+5FU  . Squamous cell carcinoma of skin 03/11/2004   Left forehead (MOHs)  . Squamous cell carcinoma of skin 03/11/2004   Right sideburn - CX3+5FU+excision  . Squamous cell carcinoma of skin 12/01/2004   Left temple - CX3+excision  . Squamous cell carcinoma of skin 04/13/2006   Mid scalp, sup - tx p bx  . Squamous cell carcinoma of skin 04/13/2006   Mid scalp, inf - tx p bx  . Squamous cell carcinoma of skin  04/13/2006   Left temple - tx p bx  . Squamous cell carcinoma of skin 06/30/2010   Left low back - tx p bx  . Squamous cell carcinoma of skin 06/23/2013   Front scalp - CX3+5FU  . Squamous cell carcinoma of skin 01/14/2018   Left scalp - CX3+5FU    Past Surgical History:  Procedure Laterality Date  . FEMUR IM NAIL Left 10/06/2019   Procedure: INTRAMEDULLARY (IM) RETROGRADE FEMORAL NAIL REMOVAL ( [employer]);  Surgeon: Marybelle Killings, MD;  Location: [facility];  Service: Orthopedics;  Laterality: Left;  . FEMUR SURGERY Bilateral   . INTRAMEDULLARY (IM) NAIL INTERTROCHANTERIC Left 10/06/2019   Procedure: BIOMET AFFIXUS SHORT NAIL,  INTERTROCHANTRIC;  Surgeon: Marybelle Killings, MD;  Location: [place];  Service: Orthopedics;  Laterality: Left;  . LEG SURGERY Left   . SHOULDER SURGERY Left     There were no vitals filed for this visit.   Subjective Assessment - 05/11/20 1523    Subjective Pt reports pain is 0.2/10 this rx    Currently in Pain? Yes    Pain Score 1     Pain Location Knee    Pain Orientation Left  [facility] Adult PT Treatment/Exercise - 05/11/20 0001      High Level Balance   High Level Balance Comments fwd/bkwd stepping      Knee/Hip Exercises: Aerobic   Recumbent Bike L1 x 6 min    Nustep L5 x 7 min      Knee/Hip Exercises: Machines for Strengthening   Cybex Knee Extension 10# 2x10    Cybex Knee Flexion 25lb 2x15, SL 15lb x 10 each       Knee/Hip Exercises: Standing   Other Standing Knee Exercises standing marches, hip abd/ext, knee flexion, heel raises 1x10      Knee/Hip Exercises: Seated   Sit to Sand 2 sets;10 reps;without UE support   foam mat                    PT Short Term Goals - 03/31/20 1758      PT SHORT TERM GOAL #1   Title pt will be I with inital HEP )    Time 2    Period Weeks    Status New    Target Date 04/14/20             PT Long Term Goals - 04/27/20 1535      PT LONG TERM  GOAL #1   Title Pt will demonstrate TUG with LRAD <15 sec    Status Achieved   14.88 seconds                Plan - 05/11/20 1615    Clinical Impression Statement Updated pt HEP to include standing hip/knee ex's per pt request and to increase standing tolerance. Pt required cuing to avoid compensation with standing ex's. Pt would like to ambulate without SPC; advised that this would not be safe at this time. Educated pt on practicing taking steps fwd with counter beside him for support.    PT Treatment/Interventions ADLs/Self Care Home Management;Electrical Stimulation;Iontophoresis 4mg /ml Dexamethasone;Moist Heat;Neuromuscular re-education;Balance training;Therapeutic exercise;Therapeutic activities;Functional mobility training;Stair training;Gait training;Patient/family education;Manual techniques;Passive range of motion    PT Next Visit Plan gait with LRAD, LE strengthening/flexibility, manual as indicated    Consulted and Agree with Plan of Care Patient           Patient will benefit from skilled therapeutic intervention in order to improve the following deficits and impairments:  Abnormal gait, Decreased range of motion, Difficulty walking, Decreased endurance, Decreased safety awareness, Decreased activity tolerance, Pain, Decreased balance, Hypomobility, Decreased strength  Visit Diagnosis: Difficulty in walking, not elsewhere classified  Pain in left hip  Muscle weakness (generalized)     Problem List Patient Active Problem List   Diagnosis Date Noted  . Multiple facial fractures, closed, initial encounter ([place]) 02/04/2020  . Closed left hip fracture ([place]) 10/04/2019  . Essential hypertension 10/04/2019  . Fall   . Port-A-Cath in place 02/04/2019  . Thrombocytopenia ([place]) 01/22/2018  . 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 History of arthroscopy of left shoulder 07/12/2016  . Port catheter in  place 02/22/2016  . 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 Vitamin D deficiency 11/21/2012  . Lymphoma ([place]) 06/13/2012   Amador Cunas, PT, DPT Donald Prose Adeline Petitfrere 05/11/2020, 4:18 PM  [place] [address] [place], [place], [address] Phone: [phone]   Fax:  [phone]  Name: [name] [name] MRN: [id number] Date of Birth: 10/13/1940

## 2020-05-13 ENCOUNTER — Other Ambulatory Visit: Payer: Self-pay

## 2020-05-13 ENCOUNTER — Encounter: Payer: Self-pay | Admitting: Physical Therapy

## 2020-05-13 ENCOUNTER — Ambulatory Visit: Payer: Medicare HMO | Admitting: Physical Therapy

## 2020-05-13 DIAGNOSIS — R262 Difficulty in walking, not elsewhere classified: Secondary | ICD-10-CM

## 2020-05-13 DIAGNOSIS — M25552 Pain in left hip: Secondary | ICD-10-CM | POA: Diagnosis not present

## 2020-05-13 DIAGNOSIS — M6281 Muscle weakness (generalized): Secondary | ICD-10-CM | POA: Diagnosis not present

## 2020-05-13 NOTE — Therapy (Signed)
[place], [place], [address] Phone: [phone]   Fax:  [phone]  Physical Therapy Treatment Progress Note Reporting Period 03/31/2020 to 05/13/2020  See note below for Objective Data and Assessment of Progress/Goals.      Patient Details  Name: [name] [name] MRN: [id number] Date of Birth: 05-01-1941 Referring Provider (PT): Rodell Perna   Encounter Date: 05/13/2020   PT End of Session - 05/13/20 1628    Visit Number 10    Date for PT Re-Evaluation 06/01/20    PT Start Time 5400    PT Stop Time 1610    PT Time Calculation (min) 40 min    Activity Tolerance Patient tolerated treatment well    Behavior During Therapy [facility] for tasks assessed/performed           Past Medical History:  Diagnosis Date  . Basal cell carcinoma 06/08/1998   Upper lip - CX3+5FU  . Basal cell carcinoma 06/22/1998   Left front neck - CX3+5FU  . Basal cell carcinoma 03/11/2004   Right temple [facility])  . Basal cell carcinoma 04/24/2016   Left top shoulder - Clear  . Basal cell carcinoma 01/14/2018   Right sideburn - CX3+5FU  . Femur fracture ([place])   . Hip fracture ([place])   . Hypertension   . Malignant neoplasm of pancreas, part unspecified   . 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 Other malignant lymphomas, unspecified site, extranodal and solid organ sites   . Squamous cell carcinoma of skin 09/26/2001   Left forehead - CX3+excision  . Squamous cell carcinoma of skin 03/11/2004   Post right scalp - CX3+5FU  . Squamous cell carcinoma of skin 03/11/2004   Left forehead (MOHs)  . Squamous cell carcinoma of skin 03/11/2004   Right sideburn - CX3+5FU+excision  . Squamous cell carcinoma of skin 12/01/2004   Left temple - CX3+excision  . Squamous cell carcinoma of skin 04/13/2006   Mid  scalp, sup - tx p bx  . Squamous cell carcinoma of skin 04/13/2006   Mid scalp, inf - tx p bx  . Squamous cell carcinoma of skin 04/13/2006   Left temple - tx p bx  . Squamous cell carcinoma of skin 06/30/2010   Left low back - tx p bx  . Squamous cell carcinoma of skin 06/23/2013   Front scalp - CX3+5FU  . Squamous cell carcinoma of skin 01/14/2018   Left scalp - CX3+5FU    Past Surgical History:  Procedure Laterality Date  . FEMUR IM NAIL Left 10/06/2019   Procedure: INTRAMEDULLARY (IM) RETROGRADE FEMORAL NAIL REMOVAL ( [employer]);  Surgeon: Marybelle Killings, MD;  Location: [facility];  Service: Orthopedics;  Laterality: Left;  . FEMUR SURGERY Bilateral   . INTRAMEDULLARY (IM) NAIL INTERTROCHANTERIC Left 10/06/2019   Procedure: BIOMET AFFIXUS SHORT NAIL,  INTERTROCHANTRIC;  Surgeon: Marybelle Killings, MD;  Location: [facility];  Service: Orthopedics;  Laterality: Left;  . LEG SURGERY Left   . SHOULDER SURGERY Left     There were no vitals filed for this visit.   Subjective Assessment - 05/13/20 1535    Subjective Pt reports no pain this rx    Currently in Pain? No/denies    Pain Score 0-No pain    Pain Location Knee    Pain Orientation Left  [facility] PT Assessment - 05/13/20 0001      AROM   Overall AROM Comments Pain with active hip flexion on L; no pain with passive hip flexion      Strength   Overall Strength Comments BLE strength 4+/5      Flexibility   Soft Tissue Assessment /Muscle Length yes    Hamstrings tight      Ambulation/Gait   Gait Comments wide BOS with decreased step length with SPC                         [facility] Adult PT Treatment/Exercise - 05/13/20 0001      Knee/Hip Exercises: Stretches   Passive Hamstring Stretch Both;4 reps;20 seconds    ITB Stretch Both;4 reps;20 seconds    Piriformis Stretch Both;4 reps;20 seconds    Other Knee/Hip Stretches hip adductor stretch x4, 20 sec hold B      Knee/Hip Exercises: Aerobic    Recumbent Bike L1 x 6 min    Nustep L5 x 7 min      Knee/Hip Exercises: Seated   Sit to Sand 2 sets;10 reps;without UE support      Manual Therapy   Manual Therapy Passive ROM    Passive ROM hip PROM all directions                    PT Short Term Goals - 03/31/20 1758      PT SHORT TERM GOAL #1   Title pt will be I with inital HEP )    Time 2    Period Weeks    Status New    Target Date 04/14/20             PT Long Term Goals - 05/13/20 1549      PT LONG TERM GOAL #1   Title Pt will demonstrate TUG with LRAD <15 sec    Status Achieved   14.88 seconds     PT LONG TERM GOAL #2   Title Pt will demonstrate STS x5 with UE support on thighs from chair    Status Achieved      PT LONG TERM GOAL #3   Title Pt will demonstrate gait WFL over level and unlevel surfaces >500 ft with LRAD and supervision assist    Status On-going      PT LONG TERM GOAL #4   Title Pt will report reduction in L hip pain by 50%    Status Achieved      PT LONG TERM GOAL #5   Title will ascend/descend 13 stairs with R HR and LRAD with supervision assist in able to access basement    Status New                 Plan - 05/13/20 1628    Clinical Impression Statement Pt has made progress towards goals; strength is improving although pt remains functionally weak with standing activities. Pt ROM is improved; however, significant tightness in hip IR and tightness of piriformis and hamstrings. Pt ambulating with widened BOS and shortened step length. Over the next few weeks, pt would like to focus on normalizing gait pattern to work towards ambulating without AD and on stair progression so pt can safely access basement of home.    PT Treatment/Interventions ADLs/Self Care Home Management;Electrical Stimulation;Iontophoresis 4mg /ml Dexamethasone;Moist Heat;Neuromuscular re-education;Balance training;Therapeutic exercise;Therapeutic activities;Functional mobility training;Stair training;Gait  training;Patient/family education;Manual techniques;Passive range of motion    PT Next Visit  Plan gait with LRAD, LE strength, manual stretching/PROM, stair progression    Consulted and Agree with Plan of Care Patient           Patient will benefit from skilled therapeutic intervention in order to improve the following deficits and impairments:  Abnormal gait, Decreased range of motion, Difficulty walking, Decreased endurance, Decreased safety awareness, Decreased activity tolerance, Pain, Decreased balance, Hypomobility, Decreased strength  Visit Diagnosis: Difficulty in walking, not elsewhere classified  Pain in left hip  Muscle weakness (generalized)     Problem List Patient Active Problem List   Diagnosis Date Noted  . Multiple facial fractures, closed, initial encounter ([place]) 02/04/2020  . Closed left hip fracture ([place]) 10/04/2019  . Essential hypertension 10/04/2019  . Fall   . Port-A-Cath in place 02/04/2019  . Thrombocytopenia ([place]) 01/22/2018  . 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 History of arthroscopy of left shoulder 07/12/2016  . Port catheter in place 02/22/2016  . 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 Vitamin D deficiency 11/21/2012  . Lymphoma ([place]) 06/13/2012   Amador Cunas, PT, DPT Donald Prose Faydra Korman 05/13/2020, 4:46 PM  [place] [address] [place], [place], [address] Phone: [phone]   Fax:  [phone]  Name: [name] [name] MRN: [id number] Date of Birth: 02-Feb-1941

## 2020-05-18 ENCOUNTER — Ambulatory Visit: Payer: Medicare HMO | Admitting: Physical Therapy

## 2020-05-20 ENCOUNTER — Encounter: Payer: Self-pay | Admitting: Physical Therapy

## 2020-05-20 ENCOUNTER — Other Ambulatory Visit: Payer: Self-pay

## 2020-05-20 ENCOUNTER — Ambulatory Visit: Payer: Medicare HMO | Attending: Orthopaedic Surgery | Admitting: Physical Therapy

## 2020-05-20 DIAGNOSIS — M25552 Pain in left hip: Secondary | ICD-10-CM | POA: Insufficient documentation

## 2020-05-20 DIAGNOSIS — R262 Difficulty in walking, not elsewhere classified: Secondary | ICD-10-CM | POA: Diagnosis not present

## 2020-05-20 DIAGNOSIS — M6281 Muscle weakness (generalized): Secondary | ICD-10-CM | POA: Diagnosis not present

## 2020-05-20 NOTE — Therapy (Signed)
[place], [place], [address] Phone: [phone]   Fax:  [phone]  Physical Therapy Treatment  Patient Details  Name: [name] [name] MRN: [id number] Date of Birth: Mar 23, 1941 Referring Provider (PT): Rodell Perna   Encounter Date: 05/20/2020   PT End of Session - 05/20/20 1545    Visit Number 11    PT Start Time 5053    PT Stop Time 9767    PT Time Calculation (min) 45 min    Activity Tolerance Patient tolerated treatment well    Behavior During Therapy [facility] for tasks assessed/performed           Past Medical History:  Diagnosis Date  . Basal cell carcinoma 06/08/1998   Upper lip - CX3+5FU  . Basal cell carcinoma 06/22/1998   Left front neck - CX3+5FU  . Basal cell carcinoma 03/11/2004   Right temple [facility])  . Basal cell carcinoma 04/24/2016   Left top shoulder - Clear  . Basal cell carcinoma 01/14/2018   Right sideburn - CX3+5FU  . Femur fracture ([place])   . Hip fracture ([place])   . Hypertension   . Malignant neoplasm of pancreas, part unspecified   . 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 Other malignant lymphomas, unspecified site, extranodal and solid organ sites   . Squamous cell carcinoma of skin 09/26/2001   Left forehead - CX3+excision  . Squamous cell carcinoma of skin 03/11/2004   Post right scalp - CX3+5FU  . Squamous cell carcinoma of skin 03/11/2004   Left forehead (MOHs)  . Squamous cell carcinoma of skin 03/11/2004   Right sideburn - CX3+5FU+excision  . Squamous cell carcinoma of skin 12/01/2004   Left temple - CX3+excision  . Squamous cell carcinoma of skin 04/13/2006   Mid scalp, sup - tx p bx  . Squamous cell carcinoma of skin 04/13/2006   Mid scalp, inf - tx p bx  . Squamous cell carcinoma of skin 04/13/2006   Left temple - tx p bx  .  Squamous cell carcinoma of skin 06/30/2010   Left low back - tx p bx  . Squamous cell carcinoma of skin 06/23/2013   Front scalp - CX3+5FU  . Squamous cell carcinoma of skin 01/14/2018   Left scalp - CX3+5FU    Past Surgical History:  Procedure Laterality Date  . FEMUR IM NAIL Left 10/06/2019   Procedure: INTRAMEDULLARY (IM) RETROGRADE FEMORAL NAIL REMOVAL ( [employer]);  Surgeon: Marybelle Killings, MD;  Location: [place];  Service: Orthopedics;  Laterality: Left;  . FEMUR SURGERY Bilateral   . INTRAMEDULLARY (IM) NAIL INTERTROCHANTERIC Left 10/06/2019   Procedure: BIOMET AFFIXUS SHORT NAIL,  INTERTROCHANTRIC;  Surgeon: Marybelle Killings, MD;  Location: [place];  Service: Orthopedics;  Laterality: Left;  . LEG SURGERY Left   . SHOULDER SURGERY Left     There were no vitals filed for this visit.   Subjective Assessment - 05/20/20 1500    Subjective "I feel pretty good, I don't have any pain today"    Currently in Pain? No/denies                             [facility] Adult PT Treatment/Exercise - 05/20/20 0001      Knee/Hip Exercises: Aerobic   Recumbent Bike  L2 x4 min     Nustep L5 x 7 min      Knee/Hip Exercises: Machines for Strengthening   Cybex Knee Extension 10lb 2x15    Cybex Knee Flexion 25lb x15, 35lb x10      Knee/Hip Exercises: Standing   Walking with Sports Cord 20lb x 5 froward/back x3 side step    Other Standing Knee Exercises Hip abd 2x15, Standing marches x10 each       Knee/Hip Exercises: Seated   Long Arc Quad --   5lb dumbbell    Sit to [employer] 2 sets;10 reps;without UE support                    PT Short Term Goals - 03/31/20 1758      PT SHORT TERM GOAL #1   Title pt will be I with inital HEP )    Time 2    Period Weeks    Status New    Target Date 04/14/20             PT Long Term Goals - 05/20/20 1530      PT LONG TERM GOAL #1   Title Pt will demonstrate TUG with LRAD <15 sec    Status Achieved      PT LONG TERM GOAL  #2   Title Pt will demonstrate STS x5 with UE support on thighs from chair    Status Achieved      PT LONG TERM GOAL #3   Title Pt will demonstrate gait WFL over level and unlevel surfaces >500 ft with LRAD and supervision assist    Status On-going      PT LONG TERM GOAL #4   Title Pt will report reduction in L hip pain by 50%    Status Achieved                 Plan - 05/20/20 1546    Clinical Impression Statement Pt continues to have a widened BOS and shorten step with gait. Some weakness, fatigue, and instability noted with resisted gait. Increase resistance tolerated with seated HS curls. Pt became very fatigue with standing hip abduction. Cues needed of anterior weight shift with resisted gait.    Personal Factors and Comorbidities Comorbidity 1    Comorbidities HTN    Examination-Activity Limitations Bend;Lift;Stand;Stairs;Squat;Locomotion Level    Examination-Participation Restrictions Community Activity;Interpersonal Relationship    Stability/Clinical Decision Making Stable/Uncomplicated    Rehab Potential Good    PT Frequency 2x / week    PT Treatment/Interventions ADLs/Self Care Home Management;Electrical Stimulation;Iontophoresis 4mg /ml Dexamethasone;Moist Heat;Neuromuscular re-education;Balance training;Therapeutic exercise;Therapeutic activities;Functional mobility training;Stair training;Gait training;Patient/family education;Manual techniques;Passive range of motion    PT Next Visit Plan gait with LRAD, LE strength, manual stretching/PROM, stair progression           Patient will benefit from skilled therapeutic intervention in order to improve the following deficits and impairments:  Abnormal gait, Decreased range of motion, Difficulty walking, Decreased endurance, Decreased safety awareness, Decreased activity tolerance, Pain, Decreased balance, Hypomobility, Decreased strength  Visit Diagnosis: Muscle weakness (generalized)  Pain in left hip  Difficulty in  walking, not elsewhere classified     Problem List Patient Active Problem List   Diagnosis Date Noted  . Multiple facial fractures, closed, initial encounter ([place]) 02/04/2020  . Closed left hip fracture ([place]) 10/04/2019  . Essential hypertension 10/04/2019  . Fall   . Port-A-Cath in place 02/04/2019  . Thrombocytopenia ([place]) 01/22/2018  . Primary osteoarthritis of  left shoulder 11/02/2016  . 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 History of arthroscopy of left shoulder 07/12/2016  . Port catheter in place 02/22/2016  . 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 Vitamin D deficiency 11/21/2012  . Lymphoma ([place]) 06/13/2012    Scot Jun 05/20/2020, 3:51 PM  [place] [address] [place], [place], [address] Phone: [phone]   Fax:  [phone]  Name: [name] [name] MRN: [id number] Date of Birth: [birth year]

## 2020-05-25 ENCOUNTER — Other Ambulatory Visit: Payer: Self-pay

## 2020-05-25 ENCOUNTER — Ambulatory Visit: Payer: Medicare HMO | Admitting: Physical Therapy

## 2020-05-25 ENCOUNTER — Encounter: Payer: Self-pay | Admitting: Physical Therapy

## 2020-05-25 DIAGNOSIS — M6281 Muscle weakness (generalized): Secondary | ICD-10-CM

## 2020-05-25 DIAGNOSIS — R262 Difficulty in walking, not elsewhere classified: Secondary | ICD-10-CM | POA: Diagnosis not present

## 2020-05-25 DIAGNOSIS — M25552 Pain in left hip: Secondary | ICD-10-CM

## 2020-05-25 NOTE — Therapy (Signed)
[place], [place], [address] Phone: [phone]   Fax:  [phone]  Physical Therapy Treatment  Patient Details  Name: [name] [name] MRN: [id number] Date of Birth: 20-Jun-1941 Referring Provider (PT): Rodell Perna   Encounter Date: 05/25/2020   PT End of Session - 05/25/20 1545    Visit Number 12    Date for PT Re-Evaluation 06/01/20    PT Start Time 1503    PT Stop Time 1545    PT Time Calculation (min) 42 min    Activity Tolerance Patient tolerated treatment well    Behavior During Therapy [facility] for tasks assessed/performed           Past Medical History:  Diagnosis Date  . Basal cell carcinoma 06/08/1998   Upper lip - CX3+5FU  . Basal cell carcinoma 06/22/1998   Left front neck - CX3+5FU  . Basal cell carcinoma 03/11/2004   Right temple [facility])  . Basal cell carcinoma 04/24/2016   Left top shoulder - Clear  . Basal cell carcinoma 01/14/2018   Right sideburn - CX3+5FU  . Femur fracture ([place])   . Hip fracture ([place])   . Hypertension   . Malignant neoplasm of pancreas, part unspecified   . 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 Other malignant lymphomas, unspecified site, extranodal and solid organ sites   . Squamous cell carcinoma of skin 09/26/2001   Left forehead - CX3+excision  . Squamous cell carcinoma of skin 03/11/2004   Post right scalp - CX3+5FU  . Squamous cell carcinoma of skin 03/11/2004   Left forehead (MOHs)  . Squamous cell carcinoma of skin 03/11/2004   Right sideburn - CX3+5FU+excision  . Squamous cell carcinoma of skin 12/01/2004   Left temple - CX3+excision  . Squamous cell carcinoma of skin 04/13/2006   Mid scalp, sup - tx p bx  . Squamous cell carcinoma of skin 04/13/2006   Mid scalp, inf - tx p bx  . Squamous cell carcinoma of skin  04/13/2006   Left temple - tx p bx  . Squamous cell carcinoma of skin 06/30/2010   Left low back - tx p bx  . Squamous cell carcinoma of skin 06/23/2013   Front scalp - CX3+5FU  . Squamous cell carcinoma of skin 01/14/2018   Left scalp - CX3+5FU    Past Surgical History:  Procedure Laterality Date  . FEMUR IM NAIL Left 10/06/2019   Procedure: INTRAMEDULLARY (IM) RETROGRADE FEMORAL NAIL REMOVAL ( [employer]);  Surgeon: Marybelle Killings, MD;  Location: [place];  Service: Orthopedics;  Laterality: Left;  . FEMUR SURGERY Bilateral   . INTRAMEDULLARY (IM) NAIL INTERTROCHANTERIC Left 10/06/2019   Procedure: BIOMET AFFIXUS SHORT NAIL,  INTERTROCHANTRIC;  Surgeon: Marybelle Killings, MD;  Location: [place];  Service: Orthopedics;  Laterality: Left;  . LEG SURGERY Left   . SHOULDER SURGERY Left     There were no vitals filed for this visit.   Subjective Assessment - 05/25/20 1506    Subjective "Not too much pain"    Currently in Pain? Yes    Pain Score 1     Pain Location Leg    Pain Orientation Right;Upper;Left  [place] Adult PT Treatment/Exercise - 05/25/20 0001      Knee/Hip Exercises: Aerobic   Recumbent Bike L2 x4 min     Nustep        Knee/Hip Exercises: Machines for Strengthening   Cybex Knee Extension 10lb 2x15    Cybex Knee Flexion 35lb 2x10       Knee/Hip Exercises: Standing   Heel Raises Both;2 sets;10 reps;2 seconds    Walking with Sports Cord 20lb 4 way x 3 each    Other Standing Knee Exercises Hip abd 2x10, Standing marches x10 each, Hip ext  2x10    Other Standing Knee Exercises Shoulder Ext 5lb 2x10 coe core and posture                     PT Short Term Goals - 03/31/20 1758      PT SHORT TERM GOAL #1   Title pt will be I with inital HEP )    Time 2    Period Weeks    Status New    Target Date 04/14/20             PT Long Term Goals - 05/20/20 1530      PT LONG TERM GOAL #1   Title Pt will demonstrate  TUG with LRAD <15 sec    Status Achieved      PT LONG TERM GOAL #2   Title Pt will demonstrate STS x5 with UE support on thighs from chair    Status Achieved      PT LONG TERM GOAL #3   Title Pt will demonstrate gait WFL over level and unlevel surfaces >500 ft with LRAD and supervision assist    Status On-going      PT LONG TERM GOAL #4   Title Pt will report reduction in L hip pain by 50%    Status Achieved                 Plan - 05/25/20 1545    Clinical Impression Statement Pt did well today completing all the interventions. He did report some LE fatigue afterwards. SBA needed to complete resisted gait. Increase resistance tolerated with seated leg curls. Hip weakness noted with hip abduction and extension. Some postural sway noted with shoulder extensions.    Personal Factors and Comorbidities Comorbidity 1    Comorbidities HTN    Examination-Activity Limitations Bend;Lift;Stand;Stairs;Squat;Locomotion Level    Examination-Participation Restrictions Community Activity;Interpersonal Relationship    Stability/Clinical Decision Making Stable/Uncomplicated    Rehab Potential Good    PT Frequency 2x / week    PT Duration 6 weeks    PT Treatment/Interventions ADLs/Self Care Home Management;Electrical Stimulation;Iontophoresis 4mg /ml Dexamethasone;Moist Heat;Neuromuscular re-education;Balance training;Therapeutic exercise;Therapeutic activities;Functional mobility training;Stair training;Gait training;Patient/family education;Manual techniques;Passive range of motion    PT Next Visit Plan gait with LRAD, LE strength, manual stretching/PROM, stair progression           Patient will benefit from skilled therapeutic intervention in order to improve the following deficits and impairments:  Abnormal gait, Decreased range of motion, Difficulty walking, Decreased endurance, Decreased safety awareness, Decreased activity tolerance, Pain, Decreased balance, Hypomobility, Decreased  strength  Visit Diagnosis: Muscle weakness (generalized)  Pain in left hip     Problem List Patient Active Problem List   Diagnosis Date Noted  . Multiple facial fractures, closed, initial encounter ([place]) 02/04/2020  . Closed left hip fracture ([place]) 10/04/2019  . Essential hypertension 10/04/2019  . Fall   . Port-A-Cath in  place 02/04/2019  . Thrombocytopenia ([place]) 01/22/2018  . 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 History of arthroscopy of left shoulder 07/12/2016  . Port catheter in place 02/22/2016  . 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 Vitamin D deficiency 11/21/2012  . Lymphoma ([place]) 06/13/2012    Scot Jun, PTA 05/25/2020, 3:50 PM  [place] [address] [place], [place], [address] Phone: [phone]   Fax:  [phone]  Name: [name] [name] MRN: [id number] Date of Birth: November 15, 1940

## 2020-06-01 ENCOUNTER — Ambulatory Visit: Payer: Medicare HMO | Admitting: Physical Therapy

## 2020-06-02 DIAGNOSIS — E559 Vitamin D deficiency, unspecified: Secondary | ICD-10-CM | POA: Diagnosis not present

## 2020-06-02 DIAGNOSIS — E1159 Type 2 diabetes mellitus with other circulatory complications: Secondary | ICD-10-CM | POA: Diagnosis not present

## 2020-06-02 DIAGNOSIS — I10 Essential (primary) hypertension: Secondary | ICD-10-CM | POA: Diagnosis not present

## 2020-06-02 DIAGNOSIS — N401 Enlarged prostate with lower urinary tract symptoms: Secondary | ICD-10-CM | POA: Diagnosis not present

## 2020-06-02 DIAGNOSIS — Z1389 Encounter for screening for other disorder: Secondary | ICD-10-CM | POA: Diagnosis not present

## 2020-06-02 DIAGNOSIS — Z0000 Encounter for general adult medical examination without abnormal findings: Secondary | ICD-10-CM | POA: Diagnosis not present

## 2020-06-02 DIAGNOSIS — H348132 Central retinal vein occlusion, bilateral, stable: Secondary | ICD-10-CM | POA: Diagnosis not present

## 2020-06-02 DIAGNOSIS — E7800 Pure hypercholesterolemia, unspecified: Secondary | ICD-10-CM | POA: Diagnosis not present

## 2020-06-02 DIAGNOSIS — R7309 Other abnormal glucose: Secondary | ICD-10-CM | POA: Diagnosis not present

## 2020-06-02 DIAGNOSIS — Z8572 Personal history of non-Hodgkin lymphomas: Secondary | ICD-10-CM | POA: Diagnosis not present

## 2020-06-03 ENCOUNTER — Ambulatory Visit: Payer: Medicare HMO | Admitting: Physical Therapy

## 2020-06-03 ENCOUNTER — Encounter: Payer: Self-pay | Admitting: Physical Therapy

## 2020-06-03 ENCOUNTER — Other Ambulatory Visit: Payer: Self-pay

## 2020-06-03 DIAGNOSIS — R262 Difficulty in walking, not elsewhere classified: Secondary | ICD-10-CM

## 2020-06-03 DIAGNOSIS — M6281 Muscle weakness (generalized): Secondary | ICD-10-CM

## 2020-06-03 DIAGNOSIS — M25552 Pain in left hip: Secondary | ICD-10-CM | POA: Diagnosis not present

## 2020-06-03 NOTE — Therapy (Signed)
[place] [place], [place], [address] Phone: [phone]   Fax:  [phone]  Physical Therapy Treatment  Patient Details  Name: [name] [name] MRN: [id number] Date of Birth: [birth year] Referring Provider (PT): Rodell Perna   Encounter Date: 06/03/2020   PT End of Session - 06/03/20 1557    Visit Number 13    Date for PT Re-Evaluation 07/03/20    PT Start Time 1520    PT Stop Time 1600    PT Time Calculation (min) 40 min    Activity Tolerance Patient tolerated treatment well    Behavior During Therapy [facility] for tasks assessed/performed           Past Medical History:  Diagnosis Date  . Basal cell carcinoma 06/08/1998   Upper lip - CX3+5FU  . Basal cell carcinoma 06/22/1998   Left front neck - CX3+5FU  . Basal cell carcinoma 03/11/2004   Right temple [facility])  . Basal cell carcinoma 04/24/2016   Left top shoulder - Clear  . Basal cell carcinoma 01/14/2018   Right sideburn - CX3+5FU  . Femur fracture ([place])   . Hip fracture ([place])   . Hypertension   . Malignant neoplasm of pancreas, part unspecified   . 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 Other malignant lymphomas, unspecified site, extranodal and solid organ sites   . Squamous cell carcinoma of skin 09/26/2001   Left forehead - CX3+excision  . Squamous cell carcinoma of skin 03/11/2004   Post right scalp - CX3+5FU  . Squamous cell carcinoma of skin 03/11/2004   Left forehead (MOHs)  . Squamous cell carcinoma of skin 03/11/2004   Right sideburn - CX3+5FU+excision  . Squamous cell carcinoma of skin 12/01/2004   Left temple - CX3+excision  . Squamous cell carcinoma of skin 04/13/2006   Mid scalp, sup - tx p bx  . Squamous cell carcinoma of skin 04/13/2006   Mid scalp, inf - tx p bx  . Squamous cell carcinoma of skin 04/13/2006    Left temple - tx p bx  . Squamous cell carcinoma of skin 06/30/2010   Left low back - tx p bx  . Squamous cell carcinoma of skin 06/23/2013   Front scalp - CX3+5FU  . Squamous cell carcinoma of skin 01/14/2018   Left scalp - CX3+5FU    Past Surgical History:  Procedure Laterality Date  . FEMUR IM NAIL Left 10/06/2019   Procedure: INTRAMEDULLARY (IM) RETROGRADE FEMORAL NAIL REMOVAL ( [employer]);  Surgeon: Marybelle Killings, MD;  Location: [place];  Service: Orthopedics;  Laterality: Left;  . FEMUR SURGERY Bilateral   . INTRAMEDULLARY (IM) NAIL INTERTROCHANTERIC Left 10/06/2019   Procedure: BIOMET AFFIXUS SHORT NAIL,  INTERTROCHANTRIC;  Surgeon: Marybelle Killings, MD;  Location: [place];  Service: Orthopedics;  Laterality: Left;  . LEG SURGERY Left   . SHOULDER SURGERY Left     There were no vitals filed for this visit.   Subjective Assessment - 06/03/20 1518    Subjective Im feeling pretty good    Currently in Pain? Yes    Pain Score 1     Pain Location --   Thighs   Pain Orientation Left;Right  [place] Adult PT Treatment/Exercise - 06/03/20 0001      Knee/Hip Exercises: Aerobic   Recumbent Bike L2 x4 min     Nustep L5 x 6 min       Knee/Hip Exercises: Machines for Strengthening   Cybex Leg Press 30lb 2x15, 20lb SL 2x5 each        Knee/Hip Exercises: Standing   Walking with Sports Cord 30lb side step x5 each      Knee/Hip Exercises: Seated   Sit to Sand 2 sets;10 reps;without UE support   9lb  dumbbell                    PT Short Term Goals - 03/31/20 1758      PT SHORT TERM GOAL #1   Title pt will be I with inital HEP )    Time 2    Period Weeks    Status New    Target Date 04/14/20             PT Long Term Goals - 06/03/20 1527      PT LONG TERM GOAL #1   Title Pt will demonstrate TUG with LRAD <15 sec    Status Achieved      PT LONG TERM GOAL #2   Title Pt will demonstrate STS x5 with UE support on  thighs from chair    Status Achieved      PT LONG TERM GOAL #3   Title Pt will demonstrate gait WFL over level and unlevel surfaces >500 ft with LRAD and supervision assist    Status Partially Met      PT LONG TERM GOAL #4   Title Pt will report reduction in L hip pain by 50%    Status Achieved      PT LONG TERM GOAL #5   Title will ascend/descend 13 stairs with R HR and LRAD with supervision assist in able to access basement    Status On-going                 Plan - 06/03/20 1600    Clinical Impression Statement Some LE fatigue and weakness noted today. Pt reports some instability at home when making turns and picking up objects. Added leg press for the first times without issue Cues needed during leg press to complete the full ROM. Cues needed to control the eccentric phase of resisted side step.    Personal Factors and Comorbidities Comorbidity 1    Comorbidities HTN    Examination-Activity Limitations Bend;Lift;Stand;Stairs;Squat;Locomotion Level    Examination-Participation Restrictions Community Activity;Interpersonal Relationship    Stability/Clinical Decision Making Stable/Uncomplicated    Rehab Potential Good    PT Frequency 2x / week    PT Duration 6 weeks    PT Treatment/Interventions ADLs/Self Care Home Management;Electrical Stimulation;Iontophoresis 75m/ml Dexamethasone;Moist Heat;Neuromuscular re-education;Balance training;Therapeutic exercise;Therapeutic activities;Functional mobility training;Stair training;Gait training;Patient/family education;Manual techniques;Passive range of motion    PT Next Visit Plan functional balance           Patient will benefit from skilled therapeutic intervention in order to improve the following deficits and impairments:  Abnormal gait, Decreased range of motion, Difficulty walking, Decreased endurance, Decreased safety awareness, Decreased activity tolerance, Pain, Decreased balance, Hypomobility, Decreased strength  Visit  Diagnosis: Muscle weakness (generalized)  Pain in left hip  Difficulty in walking, not elsewhere classified     Problem List Patient Active Problem List   Diagnosis Date Noted  . [place] facial [place], closed, [place]  02/04/2020  . Closed left hip fracture ([place]) 10/04/2019  . Essential hypertension 10/04/2019  . Fall   . Port-A-Cath in place 02/04/2019  . Thrombocytopenia ([place]) 01/22/2018  . 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 History of arthroscopy of left shoulder 07/12/2016  . Port catheter in place 02/22/2016  . 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 Vitamin D deficiency 11/21/2012  . Lymphoma ([place]) 06/13/2012    Scot Jun, PTA 06/03/2020, 4:11 PM  [place]. [place], [place], [address] Phone: [phone]   Fax:  [phone]  Name: [name] [name] MRN: [id number] Date of Birth: 26-Sep-1940

## 2020-06-04 ENCOUNTER — Encounter: Payer: Self-pay | Admitting: Dermatology

## 2020-06-04 NOTE — Progress Notes (Signed)
   Follow-Up Visit   Subjective  [name] [name] is a 79 y.o. male who presents for the following: Skin Problem (Patient here today for non healing spot on his right temple that was previously LN2.  Patient states that he used Hydrocortisone but it hasn't healed up.). Growths Location: Right temple-sideburn Duration:  Quality:  Associated Signs/Symptoms: Modifying Factors:  Severity:  Timing: Context: Possibly recurrent  Objective  Well appearing patient in no apparent distress; mood and affect are within normal limits.  A focused examination was performed including Head and neck.. Relevant physical exam findings are noted in the Assessment and Plan.   Assessment & Plan    Neoplasm of uncertain behavior of skin (2) Right ear superior  Skin / nail biopsy Type of biopsy: tangential   Informed consent: discussed and consent obtained   Timeout: patient name, date of birth, surgical site, and procedure verified   Procedure prep:  Patient was prepped and draped in usual sterile fashion Prep type:  Chlorhexidine Anesthesia: the lesion was anesthetized in a standard fashion   Anesthetic:  1% lidocaine w/ epinephrine 1-100,000 local infiltration Instrument used: flexible razor blade   Hemostasis achieved with: ferric subsulfate   Outcome: patient tolerated procedure well   Post-procedure details: wound care instructions given    Specimen 1 - Surgical pathology Differential Diagnosis: bcc scc Check Margins: No  right sideburn  Skin / nail biopsy Type of biopsy: tangential   Informed consent: discussed and consent obtained   Timeout: patient name, date of birth, surgical site, and procedure verified   Procedure prep:  Patient was prepped and draped in usual sterile fashion Prep type:  Chlorhexidine Anesthesia: the lesion was anesthetized in a standard fashion   Anesthetic:  1% lidocaine w/ epinephrine 1-100,000 local infiltration Instrument used: flexible razor blade    Hemostasis achieved with: ferric subsulfate   Outcome: patient tolerated procedure well   Post-procedure details: wound care instructions given    Specimen 2 - Surgical pathology Differential Diagnosis: bcc scc Check Margins: No [id number]  AK (actinic keratosis) (3) Right Frontal Scalp; Mid Parietal Scalp; Left Temporal Scalp  Nonintervention unless the lesion grows or bleeds.     I, Lavonna Monarch, MD, have reviewed all documentation for this visit.  The documentation on 06/04/20 for the exam, diagnosis, procedures, and orders are all accurate and complete.

## 2020-06-07 ENCOUNTER — Encounter: Payer: Self-pay | Admitting: Physical Therapy

## 2020-06-07 ENCOUNTER — Other Ambulatory Visit: Payer: Self-pay

## 2020-06-07 ENCOUNTER — Ambulatory Visit: Payer: Medicare HMO | Admitting: Physical Therapy

## 2020-06-07 DIAGNOSIS — R262 Difficulty in walking, not elsewhere classified: Secondary | ICD-10-CM | POA: Diagnosis not present

## 2020-06-07 DIAGNOSIS — M25552 Pain in left hip: Secondary | ICD-10-CM

## 2020-06-07 DIAGNOSIS — M6281 Muscle weakness (generalized): Secondary | ICD-10-CM

## 2020-06-07 NOTE — Therapy (Signed)
[place]. [place], [place], [address] Phone: [phone]   Fax:  [phone]  Physical Therapy Treatment  Patient Details  Name: [name] [name] MRN: [id number] Date of Birth: 1940/09/29 Referring Provider (PT): Rodell Perna   Encounter Date: 06/07/2020   PT End of Session - 06/07/20 1610    Visit Number 14    Date for PT Re-Evaluation 07/03/20    PT Start Time 1522    PT Stop Time 1604    PT Time Calculation (min) 42 min    Activity Tolerance Patient tolerated treatment well    Behavior During Therapy [facility] for tasks assessed/performed           Past Medical History:  Diagnosis Date  . Basal cell carcinoma 06/08/1998   Upper lip - CX3+5FU  . Basal cell carcinoma 06/22/1998   Left front neck - CX3+5FU  . Basal cell carcinoma 03/11/2004   Right temple [facility])  . Basal cell carcinoma 04/24/2016   Left top shoulder - Clear  . Basal cell carcinoma 01/14/2018   Right sideburn - CX3+5FU  . Femur fracture ([place])   . Hip fracture ([place])   . Hypertension   . Malignant neoplasm of pancreas, part unspecified   . 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 Other malignant lymphomas, unspecified site, extranodal and solid organ sites   . Squamous cell carcinoma of skin 09/26/2001   Left forehead - CX3+excision  . Squamous cell carcinoma of skin 03/11/2004   Post right scalp - CX3+5FU  . Squamous cell carcinoma of skin 03/11/2004   Left forehead (MOHs)  . Squamous cell carcinoma of skin 03/11/2004   Right sideburn - CX3+5FU+excision  . Squamous cell carcinoma of skin 12/01/2004   Left temple - CX3+excision  . Squamous cell carcinoma of skin 04/13/2006   Mid scalp, sup - tx p bx  . Squamous cell carcinoma of skin 04/13/2006   Mid scalp, inf - tx p bx  . Squamous cell carcinoma of skin 04/13/2006    Left temple - tx p bx  . Squamous cell carcinoma of skin 06/30/2010   Left low back - tx p bx  . Squamous cell carcinoma of skin 06/23/2013   Front scalp - CX3+5FU  . Squamous cell carcinoma of skin 01/14/2018   Left scalp - CX3+5FU    Past Surgical History:  Procedure Laterality Date  . FEMUR IM NAIL Left 10/06/2019   Procedure: INTRAMEDULLARY (IM) RETROGRADE FEMORAL NAIL REMOVAL ( [employer]);  Surgeon: Marybelle Killings, MD;  Location: [place];  Service: Orthopedics;  Laterality: Left;  . FEMUR SURGERY Bilateral   . INTRAMEDULLARY (IM) NAIL INTERTROCHANTERIC Left 10/06/2019   Procedure: BIOMET AFFIXUS SHORT NAIL,  INTERTROCHANTRIC;  Surgeon: Marybelle Killings, MD;  Location: [facility];  Service: Orthopedics;  Laterality: Left;  . LEG SURGERY Left   . SHOULDER SURGERY Left     There were no vitals filed for this visit.   Subjective Assessment - 06/07/20 1526    Subjective Pt states feeling good    Currently in Pain? No/denies    Pain Score 0-No pain                             [facility] Adult PT Treatment/Exercise - 06/07/20 0001  Knee/Hip Exercises: Aerobic   Recumbent Bike L2 x 5 min    Nustep L5 x 6 min      Knee/Hip Exercises: Machines for Strengthening   Cybex Knee Extension 15# 2x10    Cybex Knee Flexion 35lb 2x10     Cybex Leg Press 40lb 2x10 BLE, 50# 1x10 BLE, 20lb SL 2x5 each        Knee/Hip Exercises: Standing   Other Standing Knee Exercises step ups to 4" step with 2HR progressing to 1HR 2x10 B      Knee/Hip Exercises: Seated   Sit to Sand 2 sets;10 reps;without UE support   with 9# dumbbell                   PT Short Term Goals - 03/31/20 1758      PT SHORT TERM GOAL #1   Title pt will be I with inital HEP )    Time 2    Period Weeks    Status New    Target Date 04/14/20             PT Long Term Goals - 06/03/20 1527      PT LONG TERM GOAL #1   Title Pt will demonstrate TUG with LRAD <15 sec    Status Achieved       PT LONG TERM GOAL #2   Title Pt will demonstrate STS x5 with UE support on thighs from chair    Status Achieved      PT LONG TERM GOAL #3   Title Pt will demonstrate gait WFL over level and unlevel surfaces >500 ft with LRAD and supervision assist    Status Partially Met      PT LONG TERM GOAL #4   Title Pt will report reduction in L hip pain by 50%    Status Achieved      PT LONG TERM GOAL #5   Title will ascend/descend 13 stairs with R HR and LRAD with supervision assist in able to access basement    Status On-going                 Plan - 06/07/20 1610    Clinical Impression Statement Pt able to tolerate increased weight on leg press with no increase in pain; some difficulty with single leg on the LLE. Pt required cuing for step ups to avoid compensations and decrease WB through UE. Education on continuation of walking program and home exercises; likely needs reinforcement before d/c. Anticipated d/c next rx.    PT Treatment/Interventions ADLs/Self Care Home Management;Electrical Stimulation;Iontophoresis 62m/ml Dexamethasone;Moist Heat;Neuromuscular re-education;Balance training;Therapeutic exercise;Therapeutic activities;Functional mobility training;Stair training;Gait training;Patient/family education;Manual techniques;Passive range of motion    PT Next Visit Plan functional balance    Consulted and Agree with Plan of Care Patient           Patient will benefit from skilled therapeutic intervention in order to improve the following deficits and impairments:  Abnormal gait, Decreased range of motion, Difficulty walking, Decreased endurance, Decreased safety awareness, Decreased activity tolerance, Pain, Decreased balance, Hypomobility, Decreased strength  Visit Diagnosis: Muscle weakness (generalized)  Pain in left hip  Difficulty in walking, not elsewhere classified     Problem List Patient Active Problem List   Diagnosis Date Noted  . Multiple facial fractures,  closed, initial encounter ([place] 02/04/2020  . Closed left hip fracture ([place] 10/04/2019  . Essential hypertension 10/04/2019  . Fall   . Port-A-Cath in place 02/04/2019  . Thrombocytopenia ([place]  01/22/2018  . 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 History of arthroscopy of left shoulder 07/12/2016  . Port catheter in place 02/22/2016  . 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 Vitamin D deficiency 11/21/2012  . Lymphoma ([place]) 06/13/2012   Amador Cunas, PT, DPT [facility] Layana Konkel 06/07/2020, [place]. [place], [place], [address] Phone: [phone]   Fax:  [phone]  Name: ZAMAURI NEZ MRN: [id number] Date of Birth: 1940/12/29

## 2020-06-08 ENCOUNTER — Encounter: Payer: Self-pay | Admitting: Dermatology

## 2020-06-08 ENCOUNTER — Ambulatory Visit (INDEPENDENT_AMBULATORY_CARE_PROVIDER_SITE_OTHER): Payer: Medicare HMO | Admitting: Dermatology

## 2020-06-08 DIAGNOSIS — C44212 Basal cell carcinoma of skin of right ear and external auricular canal: Secondary | ICD-10-CM | POA: Diagnosis not present

## 2020-06-08 DIAGNOSIS — D485 Neoplasm of uncertain behavior of skin: Secondary | ICD-10-CM | POA: Diagnosis not present

## 2020-06-08 DIAGNOSIS — C44319 Basal cell carcinoma of skin of other parts of face: Secondary | ICD-10-CM | POA: Diagnosis not present

## 2020-06-08 DIAGNOSIS — C4491 Basal cell carcinoma of skin, unspecified: Secondary | ICD-10-CM

## 2020-06-08 DIAGNOSIS — C4441 Basal cell carcinoma of skin of scalp and neck: Secondary | ICD-10-CM | POA: Diagnosis not present

## 2020-06-08 NOTE — Progress Notes (Signed)
Right ear superior cx3 25fu  Right sideburn cx3 12fu  1.5 cm total size combined treatment size

## 2020-06-10 ENCOUNTER — Other Ambulatory Visit: Payer: Self-pay

## 2020-06-10 ENCOUNTER — Encounter: Payer: Self-pay | Admitting: Physical Therapy

## 2020-06-10 ENCOUNTER — Ambulatory Visit: Payer: Medicare HMO | Admitting: Physical Therapy

## 2020-06-10 DIAGNOSIS — M25552 Pain in left hip: Secondary | ICD-10-CM

## 2020-06-10 DIAGNOSIS — R262 Difficulty in walking, not elsewhere classified: Secondary | ICD-10-CM | POA: Diagnosis not present

## 2020-06-10 DIAGNOSIS — M6281 Muscle weakness (generalized): Secondary | ICD-10-CM

## 2020-06-10 NOTE — Therapy (Signed)
[place] [address] [place], [place], [address] Phone: [phone]   Fax:  [phone]  Physical Therapy Treatment  Patient Details  Name: [name] [name] MRN: [id number] Date of Birth: 1941-06-08 Referring Provider (PT): Rodell Perna   Encounter Date: 06/10/2020   PT End of Session - 06/10/20 1557    Visit Number 15    Date for PT Re-Evaluation 07/03/20    PT Start Time 0923    PT Stop Time 1557    PT Time Calculation (min) 42 min    Activity Tolerance Patient tolerated treatment well    Behavior During Therapy [facility] for tasks assessed/performed           Past Medical History:  Diagnosis Date  . Basal cell carcinoma 06/08/1998   Upper lip - CX3+5FU  . Basal cell carcinoma 06/22/1998   Left front neck - CX3+5FU  . Basal cell carcinoma 03/11/2004   Right temple [facility])  . Basal cell carcinoma 04/24/2016   Left top shoulder - Clear  . Basal cell carcinoma 01/14/2018   Right sideburn - CX3+5FU  . Femur fracture ([place])   . Hip fracture ([place])   . Hypertension   . Malignant neoplasm of pancreas, part unspecified   . 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 Other malignant lymphomas, unspecified site, extranodal and solid organ sites   . Squamous cell carcinoma of skin 09/26/2001   Left forehead - CX3+excision  . Squamous cell carcinoma of skin 03/11/2004   Post right scalp - CX3+5FU  . Squamous cell carcinoma of skin 03/11/2004   Left forehead (MOHs)  . Squamous cell carcinoma of skin 03/11/2004   Right sideburn - CX3+5FU+excision  . Squamous cell carcinoma of skin 12/01/2004   Left temple - CX3+excision  . Squamous cell carcinoma of skin 04/13/2006   Mid scalp, sup - tx p bx  . Squamous cell carcinoma of skin 04/13/2006   Mid scalp, inf - tx p bx  . Squamous cell carcinoma of skin 04/13/2006    Left temple - tx p bx  . Squamous cell carcinoma of skin 06/30/2010   Left low back - tx p bx  . Squamous cell carcinoma of skin 06/23/2013   Front scalp - CX3+5FU  . Squamous cell carcinoma of skin 01/14/2018   Left scalp - CX3+5FU    Past Surgical History:  Procedure Laterality Date  . FEMUR IM NAIL Left 10/06/2019   Procedure: INTRAMEDULLARY (IM) RETROGRADE FEMORAL NAIL REMOVAL ( [employer]);  Surgeon: Marybelle Killings, MD;  Location: [facility];  Service: Orthopedics;  Laterality: Left;  . FEMUR SURGERY Bilateral   . INTRAMEDULLARY (IM) NAIL INTERTROCHANTERIC Left 10/06/2019   Procedure: BIOMET AFFIXUS SHORT NAIL,  INTERTROCHANTRIC;  Surgeon: Marybelle Killings, MD;  Location: [facility];  Service: Orthopedics;  Laterality: Left;  . LEG SURGERY Left   . SHOULDER SURGERY Left     There were no vitals filed for this visit.   Subjective Assessment - 06/10/20 1515    Subjective Doing good no pain.    Currently in Pain? No/denies                             [facility] Adult PT Treatment/Exercise - 06/10/20 0001      Knee/Hip Exercises: Aerobic   Recumbent Bike  L2 x 5 min    Nustep L5 x 6 min      Knee/Hip Exercises: Machines for Strengthening   Cybex Knee Extension 15# 2x10    Cybex Knee Flexion 35lb 2x10     Cybex Leg Press 50lb 2x15; SL 20lb x10 each       Knee/Hip Exercises: Standing   Walking with Sports Cord 30lb side step x5 each    Other Standing Knee Exercises step ups to 4" step with 1HR x10 each, then 1HR 6in x10 each                    PT Short Term Goals - 03/31/20 1758      PT SHORT TERM GOAL #1   Title pt will be I with inital HEP )    Time 2    Period Weeks    Status New    Target Date 04/14/20             PT Long Term Goals - 06/10/20 1557      PT LONG TERM GOAL #1   Title Pt will demonstrate TUG with LRAD <15 sec    Status Achieved      PT LONG TERM GOAL #2   Title Pt will demonstrate STS x5 with UE support on thighs from  chair    Status Achieved      PT LONG TERM GOAL #3   Title Pt will demonstrate gait WFL over level and unlevel surfaces >500 ft with LRAD and supervision assist    Status Achieved      PT LONG TERM GOAL #4   Status Achieved      PT LONG TERM GOAL #5   Title will ascend/descend 13 stairs with R HR and LRAD with supervision assist in able to access basement    Status Achieved                 Plan - 06/10/20 1557    Clinical Impression Statement Pt has progressed completing most goals. He report decrease pain overall with ADL. he is pleased with his current functional status. Pt reports having silver sneakers and having access to the same machines in therapy.    Personal Factors and Comorbidities Comorbidity 1    Examination-Activity Limitations Bend;Lift;Stand;Stairs;Squat;Locomotion Level    Examination-Participation Restrictions Community Activity;Interpersonal Relationship    Stability/Clinical Decision Making Stable/Uncomplicated    Rehab Potential Good    PT Treatment/Interventions ADLs/Self Care Home Management;Electrical Stimulation;Iontophoresis 4mg /ml Dexamethasone;Moist Heat;Neuromuscular re-education;Balance training;Therapeutic exercise;Therapeutic activities;Functional mobility training;Stair training;Gait training;Patient/family education;Manual techniques;Passive range of motion    PT Next Visit Plan D/C PT           Patient will benefit from skilled therapeutic intervention in order to improve the following deficits and impairments:  Abnormal gait, Decreased range of motion, Difficulty walking, Decreased endurance, Decreased safety awareness, Decreased activity tolerance, Pain, Decreased balance, Hypomobility, Decreased strength  Visit Diagnosis: Difficulty in walking, not elsewhere classified  Pain in left hip  Muscle weakness (generalized)     Problem List Patient Active Problem List   Diagnosis Date Noted  . Multiple facial fractures, closed,  initial encounter ([place]) 02/04/2020  . Closed left hip fracture ([place]) 10/04/2019  . Essential hypertension 10/04/2019  . Fall   . Port-A-Cath in place 02/04/2019  . Thrombocytopenia ([place]) 01/22/2018  . 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 History of arthroscopy of left shoulder 07/12/2016  .  Port catheter in place 02/22/2016  . 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 Vitamin D deficiency 11/21/2012  . Lymphoma ([place]) 06/13/2012   PHYSICAL THERAPY DISCHARGE SUMMARY  Visits from Start of Care: 15  Plan: Patient agrees to discharge.  Patient goals were not met. Patient is being discharged due to being pleased with the current functional level.  ?????      Scot Jun, PTA 06/10/2020, 4:00 PM  [place]. [place], [place], [address] Phone: [phone]   Fax:  [phone]  Name: JAQUIL TODT MRN: [id number] Date of Birth: 1941/05/08

## 2020-06-14 ENCOUNTER — Telehealth: Payer: Self-pay | Admitting: *Deleted

## 2020-06-14 ENCOUNTER — Encounter: Payer: Self-pay | Admitting: *Deleted

## 2020-06-14 NOTE — Telephone Encounter (Signed)
-----   Message from Lavonna Monarch, MD sent at 06/11/2020  6:39 AM EDT ----- Schedule surgery with Dr. Darene Lamer

## 2020-06-14 NOTE — Telephone Encounter (Signed)
Pathology to patient, 30 minute surgery scheduled.  

## 2020-06-15 ENCOUNTER — Other Ambulatory Visit: Payer: Self-pay

## 2020-06-15 ENCOUNTER — Inpatient Hospital Stay: Payer: Medicare HMO

## 2020-06-15 ENCOUNTER — Inpatient Hospital Stay: Payer: Medicare HMO | Attending: Internal Medicine

## 2020-06-15 DIAGNOSIS — Z8572 Personal history of non-Hodgkin lymphomas: Secondary | ICD-10-CM | POA: Diagnosis present

## 2020-06-15 DIAGNOSIS — Z95828 Presence of other vascular implants and grafts: Secondary | ICD-10-CM

## 2020-06-15 DIAGNOSIS — C8251 Diffuse follicle center lymphoma, lymph nodes of head, face, and neck: Secondary | ICD-10-CM

## 2020-06-15 DIAGNOSIS — Z452 Encounter for adjustment and management of vascular access device: Secondary | ICD-10-CM | POA: Insufficient documentation

## 2020-06-15 MED ORDER — SODIUM CHLORIDE 0.9% FLUSH
10.0000 mL | Freq: Once | INTRAVENOUS | Status: AC
  Administered 2020-06-15: 10 mL
  Filled 2020-06-15: qty 10

## 2020-06-15 MED ORDER — HEPARIN SOD (PORK) LOCK FLUSH 100 UNIT/ML IV SOLN
500.0000 [IU] | Freq: Once | INTRAVENOUS | Status: AC
  Administered 2020-06-15: 500 [IU]
  Filled 2020-06-15: qty 5

## 2020-07-06 ENCOUNTER — Encounter: Payer: Self-pay | Admitting: Orthopaedic Surgery

## 2020-07-06 ENCOUNTER — Ambulatory Visit (INDEPENDENT_AMBULATORY_CARE_PROVIDER_SITE_OTHER): Payer: Medicare HMO

## 2020-07-06 ENCOUNTER — Other Ambulatory Visit: Payer: Self-pay

## 2020-07-06 ENCOUNTER — Ambulatory Visit: Payer: Medicare HMO | Admitting: Orthopaedic Surgery

## 2020-07-06 VITALS — BP 108/66 | HR 87 | Ht 69.0 in | Wt 175.0 lb

## 2020-07-06 DIAGNOSIS — M79644 Pain in right finger(s): Secondary | ICD-10-CM | POA: Diagnosis not present

## 2020-07-06 DIAGNOSIS — G8929 Other chronic pain: Secondary | ICD-10-CM

## 2020-07-06 NOTE — Progress Notes (Signed)
Office Visit Note   Patient: [name] [name]           Date of Birth: 1940-09-29           MRN: [id number] Visit Date: 07/06/2020              Requested by: [name], MD [address] [place],  [place] [address] PCP: [name], MD   Assessment & Plan: Visit Diagnoses:  1. Chronic pain of right thumb     Plan: We will splint the IP joint dorsally that he can do this intermittently to rest the thumb. If he develops recurrent symptoms he can return.  Follow-Up Instructions: No follow-ups on file.   Orders:  Orders Placed This Encounter  Procedures  . XR Finger Thumb Right   No orders of the defined types were placed in this encounter.     Procedures: No procedures performed   Clinical Data: No additional findings.   Subjective: Chief Complaint  Patient presents with  . Right Thumb - Pain    HPI 79 year old male with problems with the right thumb "jumping out of place". He states this happened 2-3 times a year he is always able to pull longitudinally on the thumb to improve the pain. This last Thursday it popped and he states he had trouble and it did not want to go back. Very painful to stretch his thumb or lift objects. He denies locking of the thumb in the flexed position no past history of injury.  Review of Systems positive for lymphoma involving the neck, arthritis left shoulder. Adhesive capsulitis. Previous retrograde nail removal left femur with proximal truck now by me January 2021 with healed fracture. All other systems are noncontributory to HPI.   Objective: Vital Signs: BP 108/66   Pulse 87   Ht 5\' 9"  (1.753 m)   Wt 175 lb (79.4 kg)   BMI 25.84 kg/m   Physical Exam Constitutional:      Appearance: He is well-developed.  HENT:     Head: Normocephalic and atraumatic.  Eyes:     Pupils: Pupils are equal, round, and reactive to light.  Neck:     Thyroid: No thyromegaly.     Trachea: No tracheal deviation.  Cardiovascular:      Rate and Rhythm: Normal rate.  Pulmonary:     Effort: Pulmonary effort is normal.     Breath sounds: No wheezing.  Abdominal:     General: Bowel sounds are normal.     Palpations: Abdomen is soft.  Skin:    General: Skin is warm and dry.     Capillary Refill: Capillary refill takes less than 2 seconds.  Neurological:     Mental Status: He is alert and oriented to person, place, and time.  Psychiatric:        Behavior: Behavior normal.        Thought Content: Thought content normal.        Judgment: Judgment normal.     Ortho Exam patient is with positive grind test CMC joint with crepitus not particularly painful mild to moderate tenderness over the A1 pulley of the thumb no thenar atrophy. Ulnar collateral ligament is symmetrical right and left hand. He has some pain with flexion of the IP joint of the thumb. Pain with gripping. No interosseous weakness ulnar nerve the elbow is normal. Good cervical range of motion.  Specialty Comments:  No specialty comments available.  Imaging: No results found.  PMFS History: Patient Active Problem List   Diagnosis Date Noted  . Multiple facial fractures, closed, initial encounter ([place]) 02/04/2020  . Closed left hip fracture ([place]) 10/04/2019  . Essential hypertension 10/04/2019  . Fall   . Port-A-Cath in place 02/04/2019  . Thrombocytopenia ([place]) 01/22/2018  . 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 History of arthroscopy of left shoulder 07/12/2016  . Port catheter in place 02/22/2016  . 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 Vitamin D deficiency 11/21/2012  . Lymphoma ([place]) 06/13/2012   Past Medical History:  Diagnosis Date  . Basal cell carcinoma 06/08/1998   Upper lip - CX3+5FU  . Basal cell carcinoma 06/22/1998   Left front neck - CX3+5FU  . Basal cell carcinoma 03/11/2004   Right temple [facility])  . Basal cell  carcinoma 04/24/2016   Left top shoulder - Clear  . Basal cell carcinoma 01/14/2018   Right sideburn - CX3+5FU  . Basal cell carcinoma 06/08/2020   sup & nod- left postauricular sulcus  . Femur fracture ([place])   . Hip fracture ([place])   . Hypertension   . Malignant neoplasm of pancreas, part unspecified   . 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 Other malignant lymphomas, unspecified site, extranodal and solid organ sites   . Squamous cell carcinoma of skin 09/26/2001   Left forehead - CX3+excision  . Squamous cell carcinoma of skin 03/11/2004   Post right scalp - CX3+5FU  . Squamous cell carcinoma of skin 03/11/2004   Left forehead (MOHs)  . Squamous cell carcinoma of skin 03/11/2004   Right sideburn - CX3+5FU+excision  . Squamous cell carcinoma of skin 12/01/2004   Left temple - CX3+excision  . Squamous cell carcinoma of skin 04/13/2006   Mid scalp, sup - tx p bx  . Squamous cell carcinoma of skin 04/13/2006   Mid scalp, inf - tx p bx  . Squamous cell carcinoma of skin 04/13/2006   Left temple - tx p bx  . Squamous cell carcinoma of skin 06/30/2010   Left low back - tx p bx  . Squamous cell carcinoma of skin 06/23/2013   Front scalp - CX3+5FU  . Squamous cell carcinoma of skin 01/14/2018   Left scalp - CX3+5FU    No family history on file.  Past Surgical History:  Procedure Laterality Date  . FEMUR IM NAIL Left 10/06/2019   Procedure: INTRAMEDULLARY (IM) RETROGRADE FEMORAL NAIL REMOVAL ( [employer]);  Surgeon: Marybelle Killings, MD;  Location: [facility];  Service: Orthopedics;  Laterality: Left;  . FEMUR SURGERY Bilateral   . INTRAMEDULLARY (IM) NAIL INTERTROCHANTERIC Left 10/06/2019   Procedure: BIOMET AFFIXUS SHORT NAIL,  INTERTROCHANTRIC;  Surgeon: Marybelle Killings, MD;  Location: [facility];  Service: Orthopedics;  Laterality: Left;  . LEG SURGERY Left   .  SHOULDER SURGERY Left    Social History   Occupational History  . Not on file  Tobacco Use  . Smoking status: Never Smoker  . Smokeless tobacco: Never Used  Vaping Use  . Vaping Use: Never used  Substance and Sexual Activity  . Alcohol use: No  . Drug use: No  . Sexual activity: Not on file

## 2020-07-08 NOTE — Progress Notes (Signed)
   Follow-Up Visit   Subjective  [name] [name] is a 79 y.o. male who presents for the following: Procedure (here for tx -right er sup & right sideburn).  Surgery visit.  Location: 2 biopsy-proven BCCs to right upper ear and right sideburn Duration:  Quality:  Associated Signs/Symptoms: Also has a spot he found on the back of this left ear Modifying Factors:  Severity:  Timing: Context:   Objective  Well appearing patient in no apparent distress; mood and affect are within normal limits.  A focused examination was performed including face.. Relevant physical exam findings are noted in the Assessment and Plan.   Assessment & Plan    Treated two confirmed basal cell carcinomas. Treated formed one combined lesion that measured 1.5 cm. Also biopsied a new pink papule that would not heal.    Neoplasm of uncertain behavior of skin Left Postauricular Sulcus  Skin / nail biopsy Type of biopsy: tangential   Informed consent: discussed and consent obtained   Timeout: patient name, date of birth, surgical site, and procedure verified   Procedure prep:  Patient was prepped and draped in usual sterile fashion (Non sterile) Prep type:  Chlorhexidine Anesthesia: the lesion was anesthetized in a standard fashion   Anesthetic:  1% lidocaine w/ epinephrine 1-100,000 local infiltration Instrument used: flexible razor blade   Outcome: patient tolerated procedure well   Post-procedure details: wound care instructions given    Specimen 1 - Surgical pathology Differential Diagnosis: bcc scc  Check Margins: No  Basal cell carcinoma (BCC) of skin of right ear Right ear superior  Destruction of lesion Complexity: simple   Destruction method: electrodesiccation and curettage   Informed consent: discussed and consent obtained   Timeout:  patient name, date of birth, surgical site, and procedure verified Anesthesia: the lesion was anesthetized in a standard fashion   Anesthetic:  1%  lidocaine w/ epinephrine 1-100,000 local infiltration Curettage performed in three different directions: Yes   Curettage cycles:  3 Lesion length (cm):  1 Lesion width (cm):  1 Margin per side (cm):  0 Final wound size (cm):  1 Hemostasis achieved with:  ferric subsulfate Outcome: patient tolerated procedure well with no complications   Post-procedure details: wound care instructions given   Additional details:  Inoculated with parenteral 5% fluorouracil  Follow up in 3 months  Basal cell carcinoma (BCC), unspecified site Right sideburn  Destruction of lesion Complexity: simple   Destruction method: electrodesiccation and curettage   Informed consent: discussed and consent obtained   Timeout:  patient name, date of birth, surgical site, and procedure verified Anesthesia: the lesion was anesthetized in a standard fashion   Anesthetic:  1% lidocaine w/ epinephrine 1-100,000 local infiltration Curettage performed in three different directions: Yes   Curettage cycles:  3 Lesion length (cm):  1.2 Lesion width (cm):  1 Margin per side (cm):  0 Final wound size (cm):  1.2 Hemostasis achieved with:  ferric subsulfate Outcome: patient tolerated procedure well with no complications   Post-procedure details: wound care instructions given   Additional details:  Inoculated with parenteral 5% fluorouracil  Follow up three months.      I, Lavonna Monarch, MD, have reviewed all documentation for this visit.  The documentation on 07/11/20 for the exam, diagnosis, procedures, and orders are all accurate and complete.

## 2020-07-11 ENCOUNTER — Encounter: Payer: Self-pay | Admitting: Dermatology

## 2020-07-22 ENCOUNTER — Ambulatory Visit: Payer: Medicare HMO | Admitting: Dermatology

## 2020-07-27 DIAGNOSIS — H34813 Central retinal vein occlusion, bilateral, with macular edema: Secondary | ICD-10-CM | POA: Diagnosis not present

## 2020-07-27 DIAGNOSIS — H35372 Puckering of macula, left eye: Secondary | ICD-10-CM | POA: Diagnosis not present

## 2020-07-27 DIAGNOSIS — H3582 Retinal ischemia: Secondary | ICD-10-CM | POA: Diagnosis not present

## 2020-07-27 DIAGNOSIS — H43813 Vitreous degeneration, bilateral: Secondary | ICD-10-CM | POA: Diagnosis not present

## 2020-07-29 ENCOUNTER — Other Ambulatory Visit: Payer: Self-pay

## 2020-07-29 ENCOUNTER — Encounter: Payer: Self-pay | Admitting: Dermatology

## 2020-07-29 ENCOUNTER — Ambulatory Visit (INDEPENDENT_AMBULATORY_CARE_PROVIDER_SITE_OTHER): Payer: Medicare HMO | Admitting: Dermatology

## 2020-07-29 DIAGNOSIS — C4491 Basal cell carcinoma of skin, unspecified: Secondary | ICD-10-CM

## 2020-07-29 DIAGNOSIS — C44219 Basal cell carcinoma of skin of left ear and external auricular canal: Secondary | ICD-10-CM

## 2020-07-29 DIAGNOSIS — C44319 Basal cell carcinoma of skin of other parts of face: Secondary | ICD-10-CM | POA: Diagnosis not present

## 2020-07-29 DIAGNOSIS — D485 Neoplasm of uncertain behavior of skin: Secondary | ICD-10-CM

## 2020-07-29 NOTE — Patient Instructions (Signed)

## 2020-08-04 ENCOUNTER — Telehealth: Payer: Self-pay | Admitting: *Deleted

## 2020-08-04 NOTE — Telephone Encounter (Signed)
Pathology to patient per [facility].  Referral sent to [facility] for [place].

## 2020-08-04 NOTE — Telephone Encounter (Signed)
-----   Message from Lavonna Monarch, MD sent at 08/03/2020  8:04 PM EST ----- Please check chart. If this is recurrent, refer for Mohs.

## 2020-08-11 ENCOUNTER — Encounter: Payer: Self-pay | Admitting: Dermatology

## 2020-08-17 ENCOUNTER — Inpatient Hospital Stay: Payer: Medicare HMO

## 2020-08-17 ENCOUNTER — Inpatient Hospital Stay: Payer: Medicare HMO | Admitting: Internal Medicine

## 2020-08-23 NOTE — Progress Notes (Signed)
   Follow-Up Visit   Subjective  [name] [name] is a 79 y.o. male who presents for the following: Procedure (BCC LEFT POST).  BCC Location: Back of left ear Duration:  Quality:  Associated Signs/Symptoms: Modifying Factors:  Severity:  Timing: Context: Also has nonhealing crust in front of right ear that bleeds, possibly recurrent  Objective  Well appearing patient in no apparent distress; mood and affect are within normal limits.  A focused examination was performed including Head and neck.. Relevant physical exam findings are noted in the Assessment and Plan.   Assessment & Plan    Neoplasm of uncertain behavior of skin Right Preauricular Area  Skin / nail biopsy Type of biopsy: tangential   Informed consent: discussed and consent obtained   Timeout: patient name, date of birth, surgical site, and procedure verified   Procedure prep:  Patient was prepped and draped in usual sterile fashion (Non sterile) Prep type:  Chlorhexidine Anesthesia: the lesion was anesthetized in a standard fashion   Anesthetic:  1% lidocaine w/ epinephrine 1-100,000 local infiltration Instrument used: flexible razor blade   Outcome: patient tolerated procedure well   Post-procedure details: wound care instructions given    Specimen 1 - Surgical pathology Differential Diagnosis: BCC SCC Check Margins: No  If this proves to be a recurrent BCC, refer for Mohs surgery.  Basal cell carcinoma (BCC), unspecified site Left Postauricular Sulcus  Destruction of lesion Complexity: simple   Destruction method: electrodesiccation and curettage   Informed consent: discussed and consent obtained   Timeout:  patient name, date of birth, surgical site, and procedure verified Anesthesia: the lesion was anesthetized in a standard fashion   Anesthetic:  1% lidocaine w/ epinephrine 1-100,000 local infiltration Curettage performed in three different directions: Yes   Curettage cycles:  3 Lesion length  (cm):  1.5 Lesion width (cm):  1 Margin per side (cm):  0 Final wound size (cm):  1.5 Hemostasis achieved with:  aluminum chloride and ferric subsulfate Outcome: patient tolerated procedure well with no complications   Post-procedure details: wound care instructions given   Additional details:  Wound innoculated with 5 fluorouracil solution.     I, Lavonna Monarch, MD, have reviewed all documentation for this visit.  The documentation on 08/23/20 for the exam, diagnosis, procedures, and orders are all accurate and complete.

## 2020-08-25 ENCOUNTER — Inpatient Hospital Stay: Payer: Medicare HMO | Attending: Internal Medicine

## 2020-08-25 ENCOUNTER — Inpatient Hospital Stay (HOSPITAL_BASED_OUTPATIENT_CLINIC_OR_DEPARTMENT_OTHER): Payer: Medicare HMO | Admitting: Internal Medicine

## 2020-08-25 ENCOUNTER — Encounter: Payer: Self-pay | Admitting: Internal Medicine

## 2020-08-25 ENCOUNTER — Inpatient Hospital Stay: Payer: Medicare HMO

## 2020-08-25 ENCOUNTER — Other Ambulatory Visit: Payer: Self-pay

## 2020-08-25 VITALS — BP 115/80 | HR 103 | Temp 96.8°F | Resp 18 | Ht 69.0 in | Wt 178.4 lb

## 2020-08-25 DIAGNOSIS — I10 Essential (primary) hypertension: Secondary | ICD-10-CM

## 2020-08-25 DIAGNOSIS — Z8572 Personal history of non-Hodgkin lymphomas: Secondary | ICD-10-CM | POA: Insufficient documentation

## 2020-08-25 DIAGNOSIS — Z85828 Personal history of other malignant neoplasm of skin: Secondary | ICD-10-CM | POA: Diagnosis not present

## 2020-08-25 DIAGNOSIS — Z452 Encounter for adjustment and management of vascular access device: Secondary | ICD-10-CM | POA: Diagnosis present

## 2020-08-25 DIAGNOSIS — C8251 Diffuse follicle center lymphoma, lymph nodes of head, face, and neck: Secondary | ICD-10-CM | POA: Diagnosis not present

## 2020-08-25 DIAGNOSIS — Z79899 Other long term (current) drug therapy: Secondary | ICD-10-CM | POA: Diagnosis not present

## 2020-08-25 LAB — CBC WITH DIFFERENTIAL (CANCER CENTER ONLY)
Abs Immature Granulocytes: 0.02 10*3/uL (ref 0.00–0.07)
Basophils Absolute: 0.1 10*3/uL (ref 0.0–0.1)
Basophils Relative: 1 %
Eosinophils Absolute: 0.2 10*3/uL (ref 0.0–0.5)
Eosinophils Relative: 3 %
HCT: 42.4 % (ref 39.0–52.0)
Hemoglobin: 14.1 g/dL (ref 13.0–17.0)
Immature Granulocytes: 0 %
Lymphocytes Relative: 26 %
Lymphs Abs: 1.8 10*3/uL (ref 0.7–4.0)
MCH: 28.4 pg (ref 26.0–34.0)
MCHC: 33.3 g/dL (ref 30.0–36.0)
MCV: 85.5 fL (ref 80.0–100.0)
Monocytes Absolute: 0.4 10*3/uL (ref 0.1–1.0)
Monocytes Relative: 6 %
Neutro Abs: 4.5 10*3/uL (ref 1.7–7.7)
Neutrophils Relative %: 64 %
Platelet Count: 153 10*3/uL (ref 150–400)
RBC: 4.96 MIL/uL (ref 4.22–5.81)
RDW: 14.3 % (ref 11.5–15.5)
WBC Count: 7.1 10*3/uL (ref 4.0–10.5)
nRBC: 0 % (ref 0.0–0.2)

## 2020-08-25 LAB — CMP (CANCER CENTER ONLY)
ALT: 20 U/L (ref 0–44)
AST: 24 U/L (ref 15–41)
Albumin: 4.3 g/dL (ref 3.5–5.0)
Alkaline Phosphatase: 83 U/L (ref 38–126)
Anion gap: 9 (ref 5–15)
BUN: 16 mg/dL (ref 8–23)
CO2: 23 mmol/L (ref 22–32)
Calcium: 9.4 mg/dL (ref 8.9–10.3)
Chloride: 107 mmol/L (ref 98–111)
Creatinine: 1.4 mg/dL — ABNORMAL HIGH (ref 0.61–1.24)
GFR, Estimated: 51 mL/min — ABNORMAL LOW (ref >60)
Glucose, Bld: 124 mg/dL — ABNORMAL HIGH (ref 70–99)
Potassium: 4.2 mmol/L (ref 3.5–5.1)
Sodium: 139 mmol/L (ref 135–145)
Total Bilirubin: 1.2 mg/dL (ref 0.3–1.2)
Total Protein: 7.6 g/dL (ref 6.5–8.1)

## 2020-08-25 LAB — LACTATE DEHYDROGENASE: LDH: 228 U/L — ABNORMAL HIGH (ref 98–192)

## 2020-08-25 NOTE — Progress Notes (Signed)
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  CURRENT THERAPY: Observation.   INTERVAL HISTORY: [name] [name] 79 y.o. male returns to the clinic today for annual follow-up visit accompanied by his daughter [name] [name].  The patient is feeling fine today with no concerning complaints.  He had hip surgery several months ago and undergoing physical therapy.  He denied having any chest pain, shortness of breath, cough or hemoptysis.  He denied having any nausea, vomiting, diarrhea or constipation.  He has no headache or visual changes.  He is here today for evaluation and repeat blood work.   MEDICAL HISTORY: Past Medical History:  Diagnosis Date  . Basal cell carcinoma 06/08/1998   Upper lip - CX3+5FU  . Basal cell carcinoma 06/22/1998   Left front neck - CX3+5FU  . Basal cell carcinoma 03/11/2004   Right temple [facility])  . Basal cell carcinoma 04/24/2016   Left top shoulder - Clear  . Basal cell carcinoma 01/14/2018   Right sideburn - CX3+5FU  . Basal cell carcinoma 06/08/2020   sup & nod- left postauricular sulcus  . Femur fracture ([place])   . Hip fracture ([place])   . Hypertension   . Malignant neoplasm of pancreas, part unspecified   . 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 Other malignant lymphomas, unspecified site, extranodal and solid organ sites   . Squamous cell carcinoma of skin  09/26/2001   Left forehead - CX3+excision  . Squamous cell carcinoma of skin 03/11/2004   Post right scalp - CX3+5FU  . Squamous cell carcinoma of skin 03/11/2004   Left forehead (MOHs)  . Squamous cell carcinoma of skin 03/11/2004   Right sideburn - CX3+5FU+excision  . Squamous cell carcinoma of skin 12/01/2004   Left temple - CX3+excision  . Squamous cell carcinoma of skin 04/13/2006   Mid scalp, sup - tx p bx  . Squamous cell carcinoma of skin 04/13/2006   Mid scalp, inf - tx p bx  . Squamous cell carcinoma of skin 04/13/2006   Left temple - tx p bx  . Squamous cell carcinoma of skin 06/30/2010   Left low back - tx p bx  . Squamous cell carcinoma of skin 06/23/2013   Front scalp - CX3+5FU  . Squamous cell carcinoma of skin 01/14/2018   Left scalp - CX3+5FU    ALLERGIES:  is allergic to morphine and related, iohexol, and sulfa antibiotics.  MEDICATIONS:  Current Outpatient Medications  Medication Sig Dispense Refill  . Aflibercept (EYLEA) 2 MG/0.05ML SOLN Inject 0.5 mLs into the eye as directed. Take every 6 wks in eye doctor office.     Marland Kitchen amLODipine (NORVASC) 5 MG tablet Take 5 mg by mouth daily.    . Ascorbic Acid (VITAMIN C) 1000 MG tablet Take 10,000 mg by mouth daily.     . cholecalciferol (VITAMIN D) 1000 UNITS tablet Take 15,000 Units by mouth daily.     Marland Kitchen ketoconazole (NIZORAL) 2 %  cream APPLY TOPICALLY ONCE DAILY FOR 14 DAYS    . lisinopril (ZESTRIL) 20 MG tablet Take 20 mg by mouth daily.    . magnesium oxide (MAG-OX) 400 MG tablet Take 400 mg by mouth daily.    . Multiple Vitamin (MULTIVITAMIN) tablet Take 6 tablets by mouth daily.     . Probiotic Product (PROBIOTIC FORMULA PO) Take 1 tablet by mouth daily.      . rosuvastatin (CRESTOR) 5 MG tablet Take 5 mg by mouth once a week.    . tamsulosin (FLOMAX) 0.4 MG CAPS capsule Take 0.4 mg by mouth daily.    . Turmeric (QC TUMERIC COMPLEX PO) Take 1 tablet by mouth daily.    . Zinc 30 MG TABS Take 1 tablet by mouth  every morning.     No current facility-administered medications for this visit.   Facility-Administered Medications Ordered in Other Visits  Medication Dose Route Frequency Provider Last Rate Last Admin  . heparin lock flush 100 unit/mL  500 Units Intravenous Once PRN Curt Bears, MD      . sodium chloride 0.9 % injection 10 mL  10 mL Intravenous PRN Curt Bears, MD   10 mL at 12/29/14 1026  . sodium chloride 0.9 % injection 10 mL  10 mL Intravenous PRN Curt Bears, MD        SURGICAL HISTORY:  Past Surgical History:  Procedure Laterality Date  . FEMUR IM NAIL Left 10/06/2019   Procedure: INTRAMEDULLARY (IM) RETROGRADE FEMORAL NAIL REMOVAL ( [employer]);  Surgeon: Marybelle Killings, MD;  Location: [facility];  Service: Orthopedics;  Laterality: Left;  . FEMUR SURGERY Bilateral   . INTRAMEDULLARY (IM) NAIL INTERTROCHANTERIC Left 10/06/2019   Procedure: BIOMET AFFIXUS SHORT NAIL,  INTERTROCHANTRIC;  Surgeon: Marybelle Killings, MD;  Location: [place];  Service: Orthopedics;  Laterality: Left;  . LEG SURGERY Left   . SHOULDER SURGERY Left     REVIEW OF SYSTEMS:  A comprehensive review of systems was negative.   PHYSICAL EXAMINATION: General appearance: alert, cooperative and no distress Head: Normocephalic, without obvious abnormality, atraumatic Neck: no adenopathy, no JVD, supple, symmetrical, trachea midline and thyroid not enlarged, symmetric, no tenderness/mass/nodules Lymph nodes: Cervical, supraclavicular, and axillary nodes normal. Resp: clear to auscultation bilaterally Back: symmetric, no curvature. ROM normal. No CVA tenderness. Cardio: regular rate and rhythm, S1, S2 normal, no murmur, click, rub or gallop GI: soft, non-tender; bowel sounds normal; no masses,  no organomegaly Extremities: extremities normal, atraumatic, no cyanosis or edema  ECOG PERFORMANCE STATUS: 1 - Symptomatic but completely ambulatory  Blood pressure 115/80, pulse (!) 103, temperature (!) 96.8 F  (36 C), temperature source Tympanic, resp. rate 18, height 5\' 9"  (1.753 m), weight 178 lb 6.4 oz (80.9 kg), SpO2 98 %.  LABORATORY DATA: Lab Results  Component Value Date   WBC 7.1 08/25/2020   HGB 14.1 08/25/2020   HCT 42.4 08/25/2020   MCV 85.5 08/25/2020   PLT 153 08/25/2020      Chemistry      Component Value Date/Time   NA 139 08/25/2020 1048   NA 140 01/23/2017 0958   K 4.2 08/25/2020 1048   K 4.0 01/23/2017 0958   CL 107 08/25/2020 1048   CL 103 11/21/2012 0930   CO2 23 08/25/2020 1048   CO2 28 01/23/2017 0958   BUN 16 08/25/2020 1048   BUN 17.0 01/23/2017 0958   CREATININE 1.40 (H) 08/25/2020 1048   CREATININE 1.3 01/23/2017 0109  Component Value Date/Time   CALCIUM 9.4 08/25/2020 1048   CALCIUM 10.2 01/23/2017 0958   ALKPHOS 83 08/25/2020 1048   ALKPHOS 61 01/23/2017 0958   AST 24 08/25/2020 1048   AST 20 01/23/2017 0958   ALT 20 08/25/2020 1048   ALT 21 01/23/2017 0958   BILITOT 1.2 08/25/2020 1048   BILITOT 1.14 01/23/2017 0958       RADIOGRAPHIC STUDIES: No results found.  ASSESSMENT AND PLAN:  This is a very pleasant 79 years old white male with history of large B-cell non-Hodgkin lymphoma presented with a large mass close to the head of the pancreas status post systemic chemotherapy with CHOP/Rituxan for 4 cycles in addition to hyperthermic therapy in [place] with almost complete response of his disease. The patient is currently on observation and he is feeling fine today with no concerning complaints. His lab work is unremarkable except for mild renal insufficiency. I recommended for the patient to increase his oral hydration. I will see him back for follow-up visit in 1 year with repeat blood work. He was advised to call immediately if he has any other concerning symptoms in the interval. The patient voices understanding of current disease status and treatment options and is in agreement with the current care plan. All questions were  answered. The patient knows to call the clinic with any problems, questions or concerns. We can certainly see the patient much sooner if necessary.  Disclaimer: This note was dictated with voice recognition software. Similar sounding words can inadvertently be transcribed and may not be corrected upon review.

## 2020-09-01 DIAGNOSIS — L600 Ingrowing nail: Secondary | ICD-10-CM | POA: Diagnosis not present

## 2020-09-01 DIAGNOSIS — M79674 Pain in right toe(s): Secondary | ICD-10-CM | POA: Diagnosis not present

## 2020-09-22 DIAGNOSIS — L600 Ingrowing nail: Secondary | ICD-10-CM | POA: Diagnosis not present

## 2020-10-19 DIAGNOSIS — H35372 Puckering of macula, left eye: Secondary | ICD-10-CM | POA: Diagnosis not present

## 2020-10-19 DIAGNOSIS — H43813 Vitreous degeneration, bilateral: Secondary | ICD-10-CM | POA: Diagnosis not present

## 2020-10-19 DIAGNOSIS — H34813 Central retinal vein occlusion, bilateral, with macular edema: Secondary | ICD-10-CM | POA: Diagnosis not present

## 2020-10-19 DIAGNOSIS — H3582 Retinal ischemia: Secondary | ICD-10-CM | POA: Diagnosis not present

## 2020-10-28 DIAGNOSIS — C44212 Basal cell carcinoma of skin of right ear and external auricular canal: Secondary | ICD-10-CM | POA: Diagnosis not present

## 2020-11-01 ENCOUNTER — Encounter: Payer: Self-pay | Admitting: Internal Medicine

## 2020-11-03 ENCOUNTER — Encounter: Payer: Self-pay | Admitting: Dermatology

## 2020-11-03 ENCOUNTER — Ambulatory Visit: Payer: Self-pay | Admitting: Dermatology

## 2020-11-03 ENCOUNTER — Ambulatory Visit: Payer: Medicare HMO | Admitting: Dermatology

## 2020-11-03 ENCOUNTER — Other Ambulatory Visit: Payer: Self-pay

## 2020-11-03 DIAGNOSIS — Z85828 Personal history of other malignant neoplasm of skin: Secondary | ICD-10-CM

## 2020-11-03 DIAGNOSIS — L570 Actinic keratosis: Secondary | ICD-10-CM

## 2020-11-03 NOTE — Patient Instructions (Addendum)
Get over the counter Pramoxine to use for itching.

## 2020-11-03 NOTE — Progress Notes (Signed)
   Follow-Up Visit   Subjective  [name] [name] is a 80 y.o. male who presents for the following: Follow-up (Patient here today for 3 month follow up. Patient would like his MOH's site checked on his right sideburn that was done on last Thursday. No new concerns).  Follow-up for multiple skin cancers.  Some new crusts. Location:  Duration:  Quality:  Associated Signs/Symptoms: Modifying Factors:  Severity:  Timing: Context:   Objective  Well appearing patient in no apparent distress; mood and affect are within normal limits. Objective  Left Buccal Cheek, Left Forehead: Erythematous patches with gritty scale.    A focused examination was performed including Head, neck, upper torso.. Relevant physical exam findings are noted in the Assessment and Plan.   Assessment & Plan    History of basal cell carcinoma (BCC) (3) Left Upper Back; Left Postauricular Sulcus; Right Zygomatic Area  AK (actinic keratosis) (2) Left Buccal Cheek; Left Forehead  Destruction of lesion - Left Buccal Cheek, Left Forehead Complexity: simple   Destruction method: cryotherapy   Informed consent: discussed and consent obtained   Timeout:  patient name, date of birth, surgical site, and procedure verified Lesion destroyed using liquid nitrogen: Yes   Cryotherapy cycles:  5 Outcome: patient tolerated procedure well with no complications   Post-procedure details: wound care instructions given     2 issues discussed with [name] [name] today.  He has some residual crusting mainly on the left forehead and cheek which represent relatively thick actinic keratoses and were treated with 6-second liquid nitrogen freeze.  He can expect some swelling and possible peeling in the next 2 weeks.  No special care the recent Mohs site on the right cheek looks good and he knows if there is any issue he can call me or Dr. Link Snuffer at any time.  Secondly for 2 weeks he has been having forearm itching without rash which may  represent brachial radial pruritus.  He can look for any over-the-counter cream that has the ingredient pramoxine (samples CeraVe itch relief provided).  This can be applied daily after bathing and as frequently as he wishes if it provides some relief from itching.  If this does not improve things in a week I have asked [name] [name] to call me in will discuss options.a~~~``````   I, [name] Monarch, MD, have reviewed all documentation for this visit.  The documentation on 11/03/20 for the exam, diagnosis, procedures, and orders are all accurate and complete.

## 2020-11-08 ENCOUNTER — Encounter: Payer: Self-pay | Admitting: Dermatology

## 2020-11-22 DIAGNOSIS — E785 Hyperlipidemia, unspecified: Secondary | ICD-10-CM | POA: Diagnosis not present

## 2020-11-22 DIAGNOSIS — I951 Orthostatic hypotension: Secondary | ICD-10-CM | POA: Diagnosis not present

## 2020-11-22 DIAGNOSIS — N529 Male erectile dysfunction, unspecified: Secondary | ICD-10-CM | POA: Diagnosis not present

## 2020-11-22 DIAGNOSIS — C9590 Leukemia, unspecified not having achieved remission: Secondary | ICD-10-CM | POA: Diagnosis not present

## 2020-11-22 DIAGNOSIS — Z8249 Family history of ischemic heart disease and other diseases of the circulatory system: Secondary | ICD-10-CM | POA: Diagnosis not present

## 2020-11-22 DIAGNOSIS — N400 Enlarged prostate without lower urinary tract symptoms: Secondary | ICD-10-CM | POA: Diagnosis not present

## 2020-11-22 DIAGNOSIS — I10 Essential (primary) hypertension: Secondary | ICD-10-CM | POA: Diagnosis not present

## 2020-11-22 DIAGNOSIS — Z008 Encounter for other general examination: Secondary | ICD-10-CM | POA: Diagnosis not present

## 2020-11-22 DIAGNOSIS — Z809 Family history of malignant neoplasm, unspecified: Secondary | ICD-10-CM | POA: Diagnosis not present

## 2020-11-22 DIAGNOSIS — Z85828 Personal history of other malignant neoplasm of skin: Secondary | ICD-10-CM | POA: Diagnosis not present

## 2020-11-22 DIAGNOSIS — R32 Unspecified urinary incontinence: Secondary | ICD-10-CM | POA: Diagnosis not present

## 2020-12-01 DIAGNOSIS — Z8572 Personal history of non-Hodgkin lymphomas: Secondary | ICD-10-CM | POA: Diagnosis not present

## 2020-12-01 DIAGNOSIS — I10 Essential (primary) hypertension: Secondary | ICD-10-CM | POA: Diagnosis not present

## 2020-12-01 DIAGNOSIS — E559 Vitamin D deficiency, unspecified: Secondary | ICD-10-CM | POA: Diagnosis not present

## 2020-12-01 DIAGNOSIS — E7800 Pure hypercholesterolemia, unspecified: Secondary | ICD-10-CM | POA: Diagnosis not present

## 2020-12-01 DIAGNOSIS — N401 Enlarged prostate with lower urinary tract symptoms: Secondary | ICD-10-CM | POA: Diagnosis not present

## 2020-12-01 DIAGNOSIS — R0681 Apnea, not elsewhere classified: Secondary | ICD-10-CM | POA: Diagnosis not present

## 2020-12-01 DIAGNOSIS — E1159 Type 2 diabetes mellitus with other circulatory complications: Secondary | ICD-10-CM | POA: Diagnosis not present

## 2020-12-01 DIAGNOSIS — H348132 Central retinal vein occlusion, bilateral, stable: Secondary | ICD-10-CM | POA: Diagnosis not present

## 2020-12-01 DIAGNOSIS — D696 Thrombocytopenia, unspecified: Secondary | ICD-10-CM | POA: Diagnosis not present

## 2020-12-07 IMAGING — DX DG CHEST 1V PORT
1 series · 1 of 1 positions shown · non-contrast
Comparison: CT chest 05/31/2012, radiograph 12/08/2008

CLINICAL DATA: Hip fracture, preoperative

EXAM:
PORTABLE CHEST 1 VIEW

[chest ap]
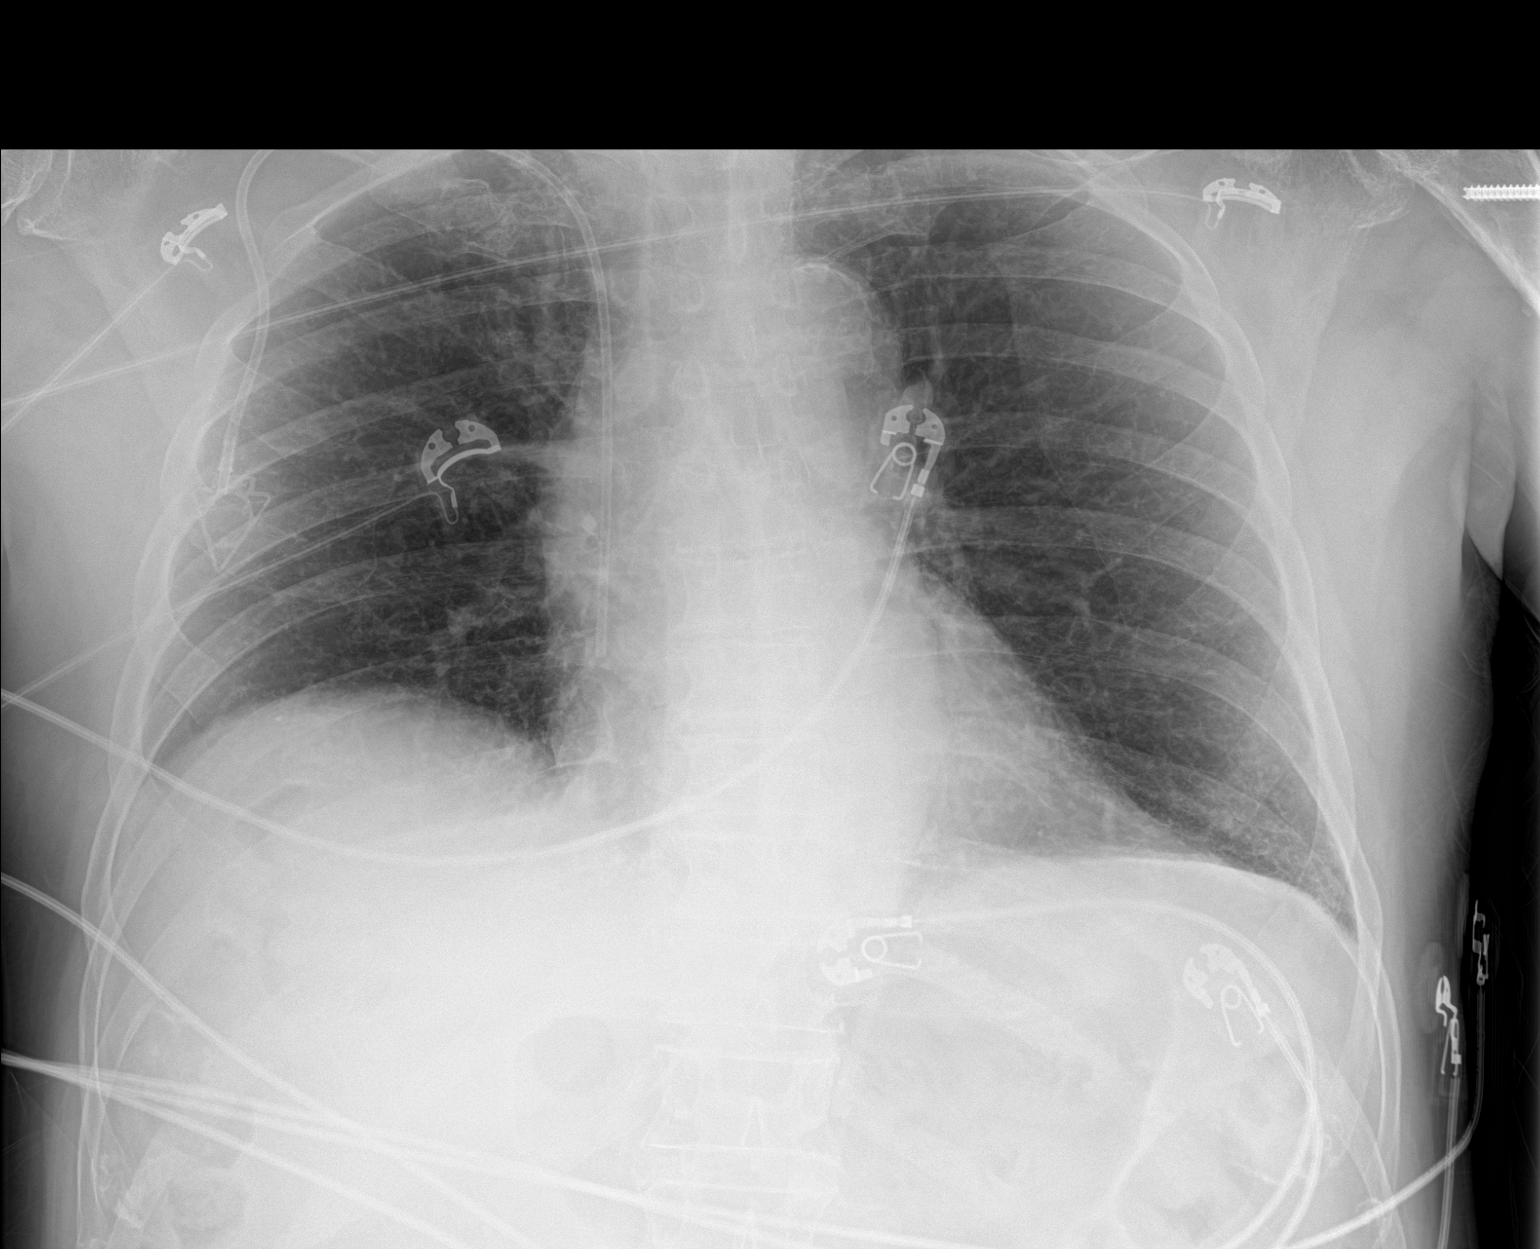

[1 of 1 positions shown; findings below may reference images not displayed]

FINDINGS: Right subclavian approach Port-A-Cath tip terminates at the right
atrium. The aorta is calcified. The remaining cardiomediastinal
contours are unremarkable. Chronic elevation of the right
hemidiaphragm is similar to multiple remote comparison is. There are
diffusely coarsened interstitial changes which could reflect chronic
and slightly worsening interstitial fibrosis seen in more early
stages on comparison CT. Partially visualized hardware in the left
humerus. Degenerative changes are present in the imaged spine and
shoulders, left greater than right. Telemetry leads overlie the
chest.
IMPRESSION: No acute cardiopulmonary abnormality.

Diffusely coarsened interstitial changes which could reflect chronic
and slightly worsening interstitial fibrotic features seen in more
early stages on comparison CT.

Aortic Atherosclerosis ([id number]).

## 2020-12-08 ENCOUNTER — Telehealth: Payer: Self-pay | Admitting: Internal Medicine

## 2020-12-08 NOTE — Telephone Encounter (Signed)
Scheduled per sch msg. Called and spoke with patient. Confirmed appt  

## 2020-12-09 ENCOUNTER — Inpatient Hospital Stay: Payer: Medicare HMO | Attending: Internal Medicine

## 2020-12-09 ENCOUNTER — Other Ambulatory Visit: Payer: Self-pay

## 2020-12-09 DIAGNOSIS — C8251 Diffuse follicle center lymphoma, lymph nodes of head, face, and neck: Secondary | ICD-10-CM

## 2020-12-09 DIAGNOSIS — Z452 Encounter for adjustment and management of vascular access device: Secondary | ICD-10-CM | POA: Diagnosis not present

## 2020-12-09 DIAGNOSIS — Z95828 Presence of other vascular implants and grafts: Secondary | ICD-10-CM

## 2020-12-09 DIAGNOSIS — Z8572 Personal history of non-Hodgkin lymphomas: Secondary | ICD-10-CM | POA: Diagnosis not present

## 2020-12-09 IMAGING — DX DG PORTABLE PELVIS
2 series · 2 of 2 positions shown · non-contrast
Comparison: Plain films left hip 10/04/2019.

CLINICAL DATA: The patient suffered a left intertrochanteric
fracture 10/04/2019 in a fall. Initial encounter.

EXAM:
PORTABLE PELVIS 1-2 VIEWS

[pelvis ap (1 of 2)]
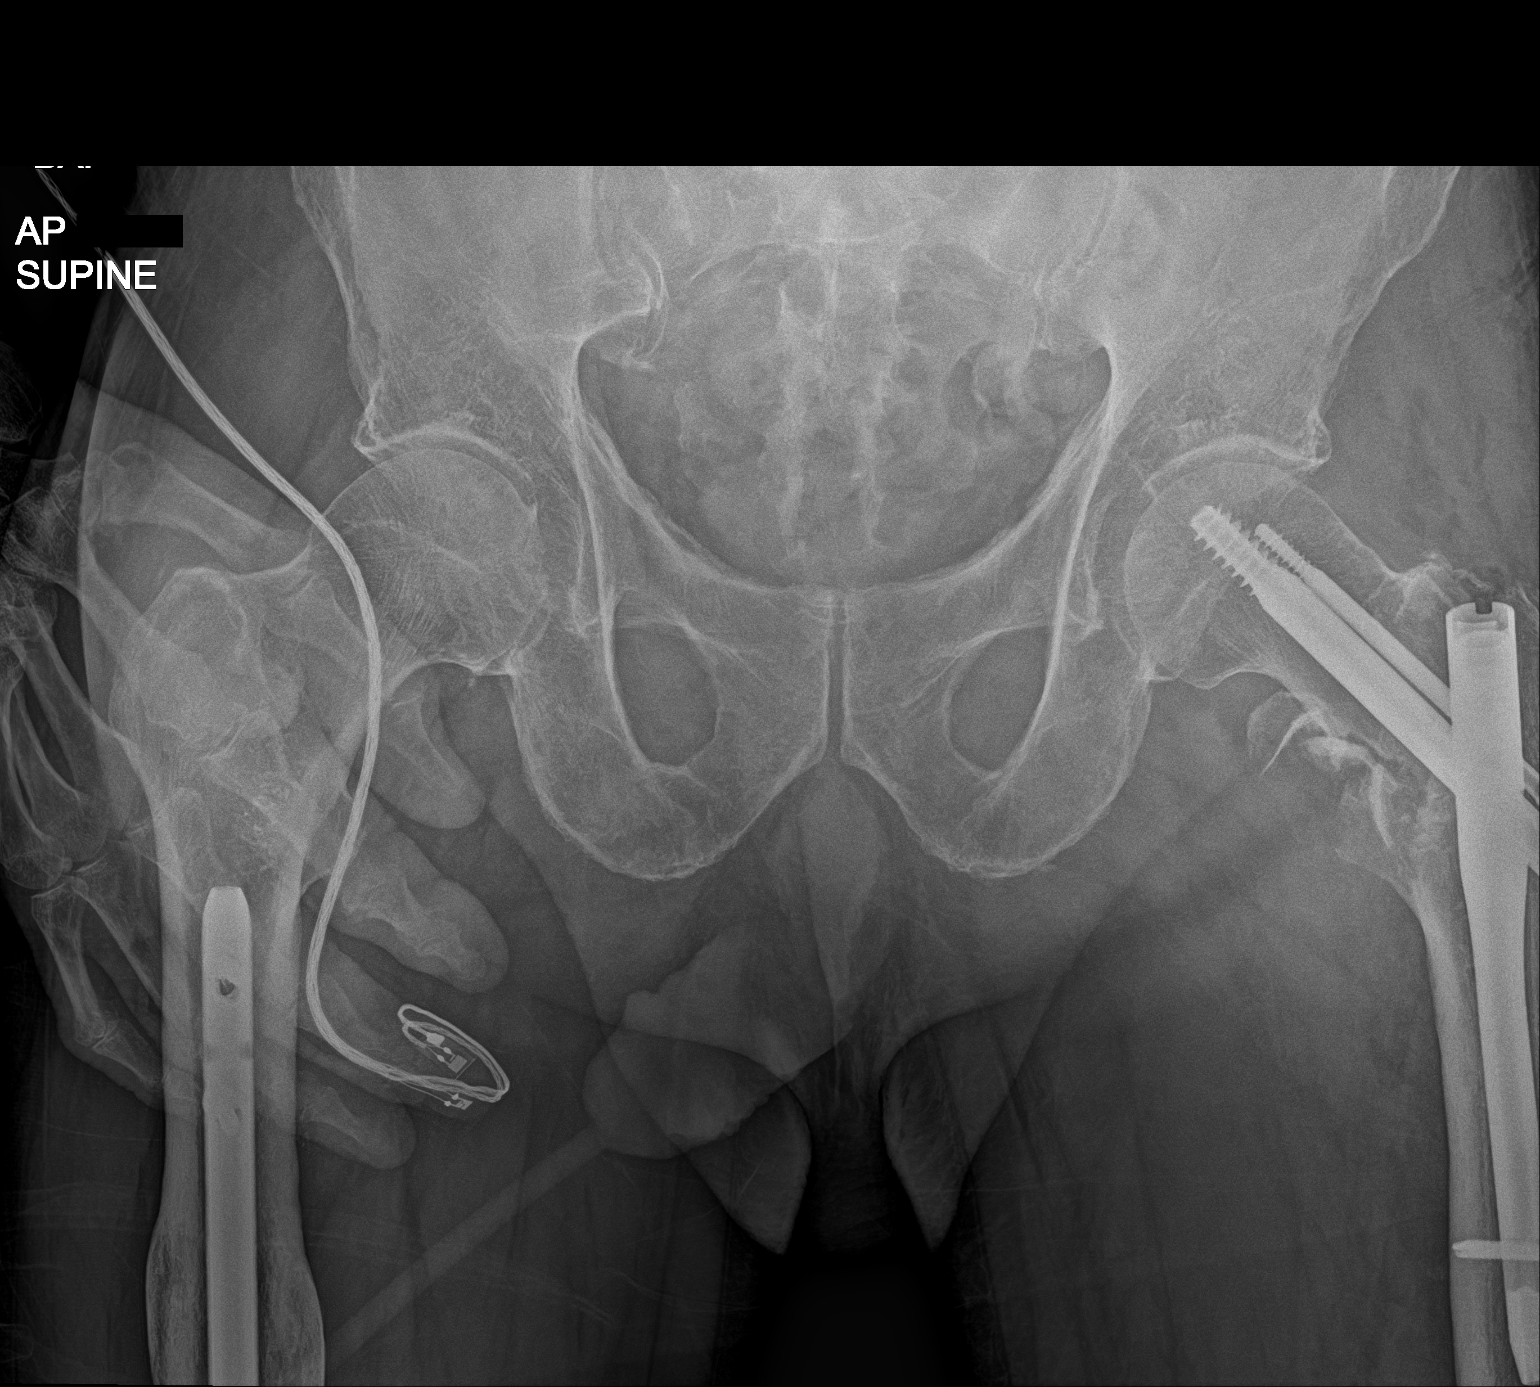

[pelvis ap (2 of 2)]
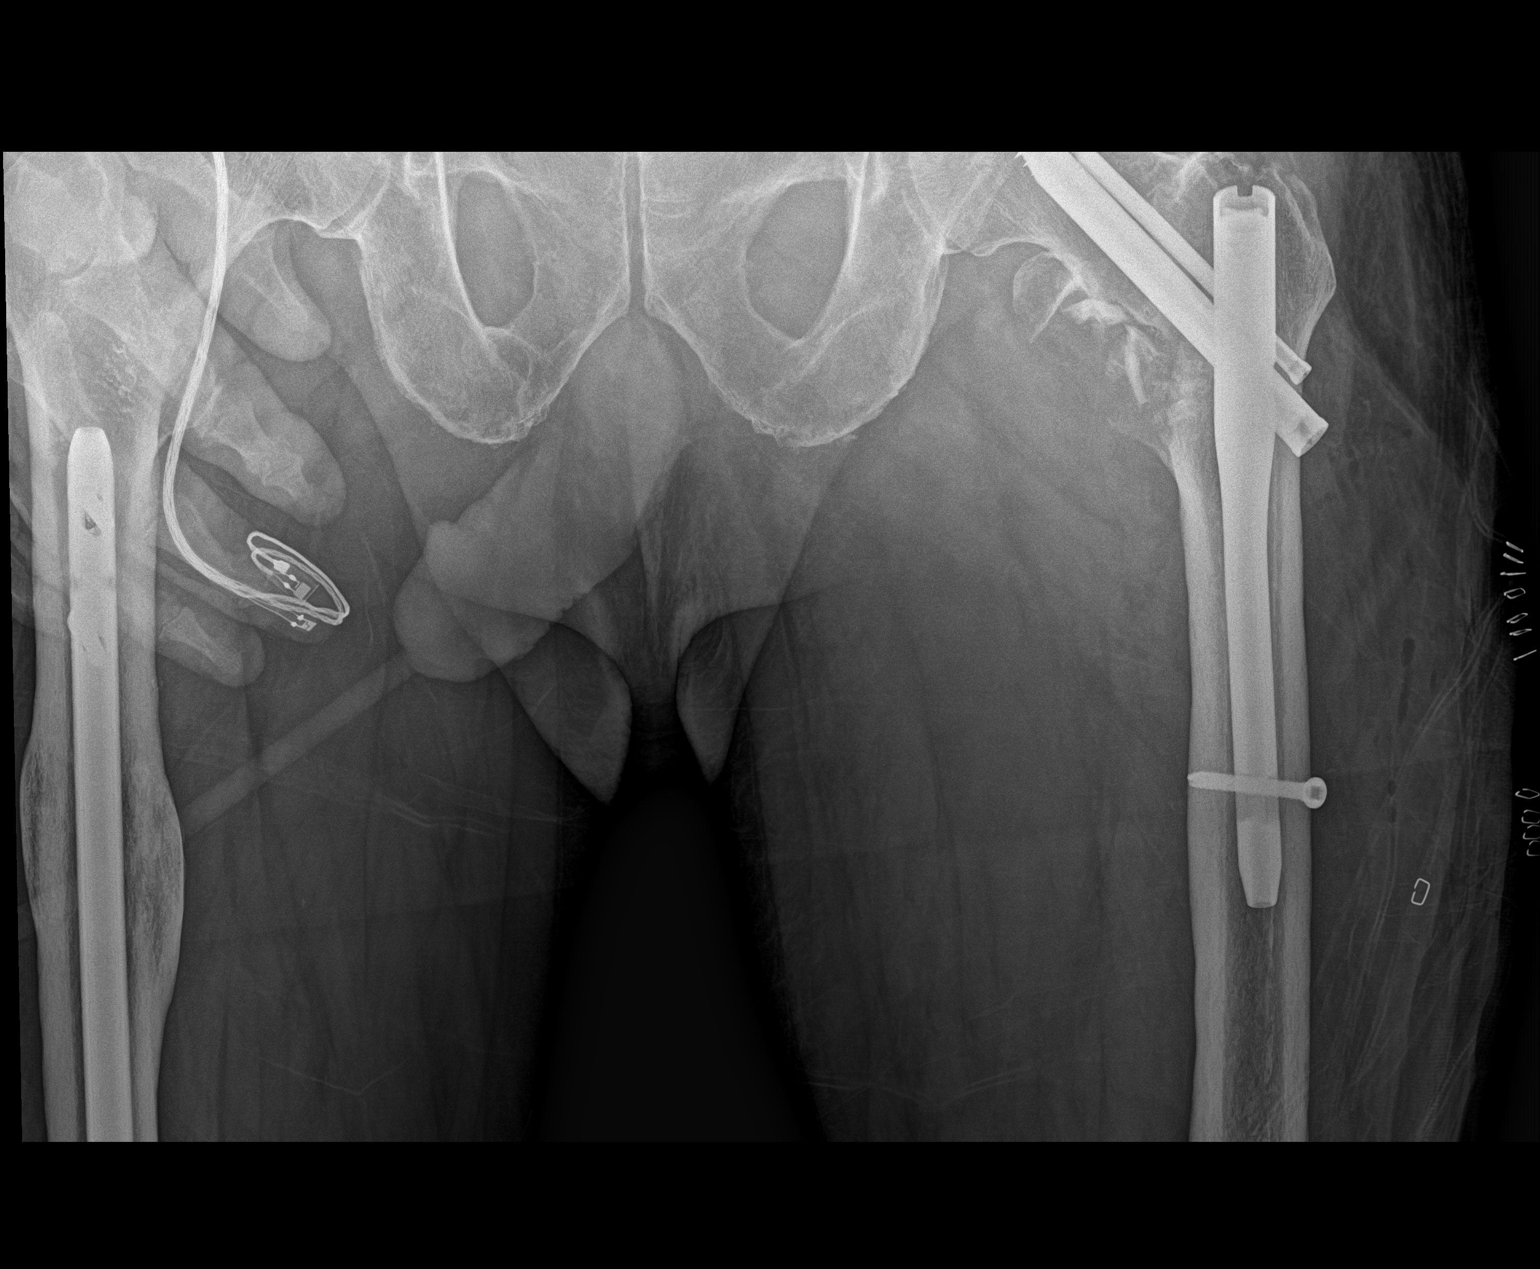

[2 of 2 positions shown; findings below may reference images not displayed]

FINDINGS: Intramedullary nail in the left femur seen on the prior exam has
been removed. Two hip screws with a short intramedullary nail and a
single distal screw are now in place. Position and alignment of the
patient's left intertrochanteric fracture are improved. The lesser
trochanter is a separate fragment.
IMPRESSION: Status post fixation of a left intertrochanteric fracture. No acute
finding.

## 2020-12-09 IMAGING — RF DG C-ARM 1-60 MIN
1 series · 5 of 5 positions shown · non-contrast
Comparison: October 04, 2019

CLINICAL DATA: Left hip intramedullary nail placement

FLUOROSCOPY TIME:  1 minutes 34 seconds.
Images: 4
EXAM:
DG HIP (WITH OR WITHOUT PELVIS) 2-3V LEFT; DG C-ARM 1-60 MIN

[Series 1: run · 5 of 5 slices shown]
[im 1/5]
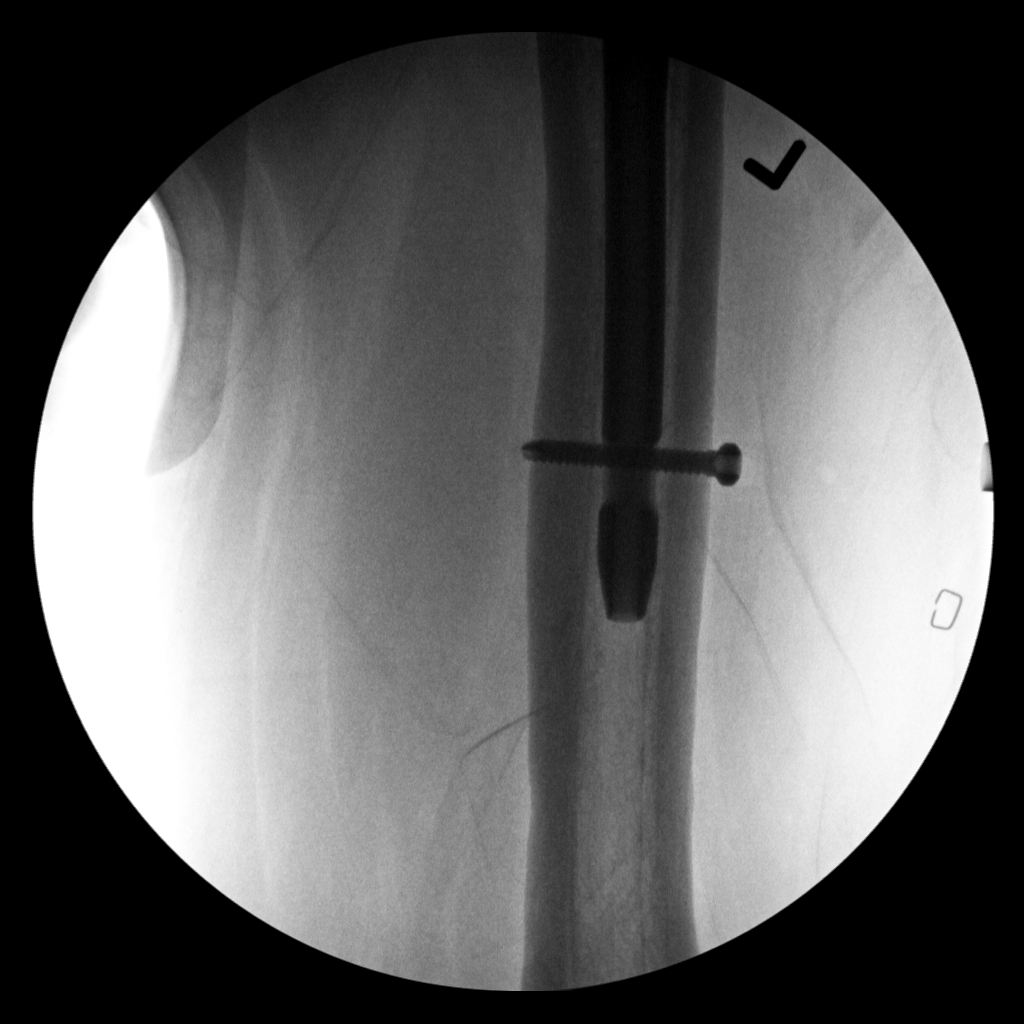
[im 2/5]
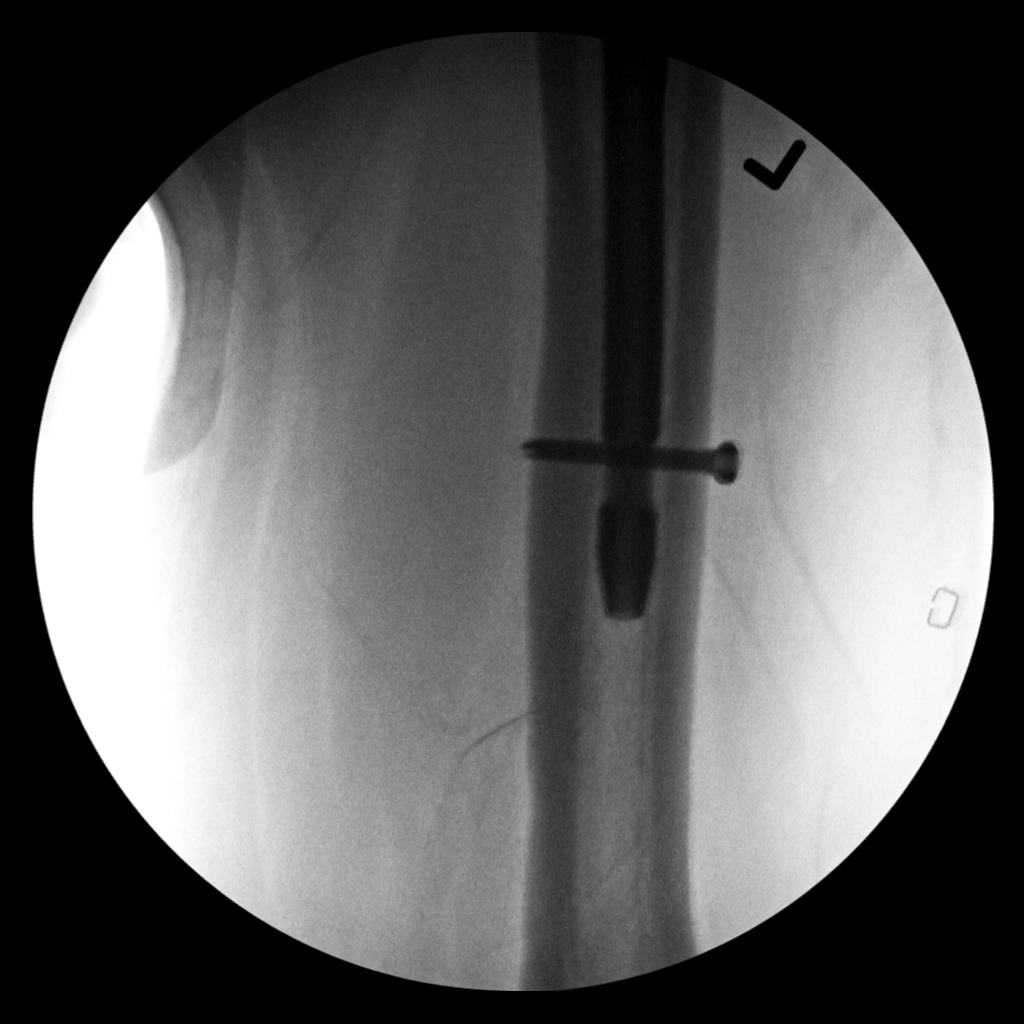
[im 3/5]
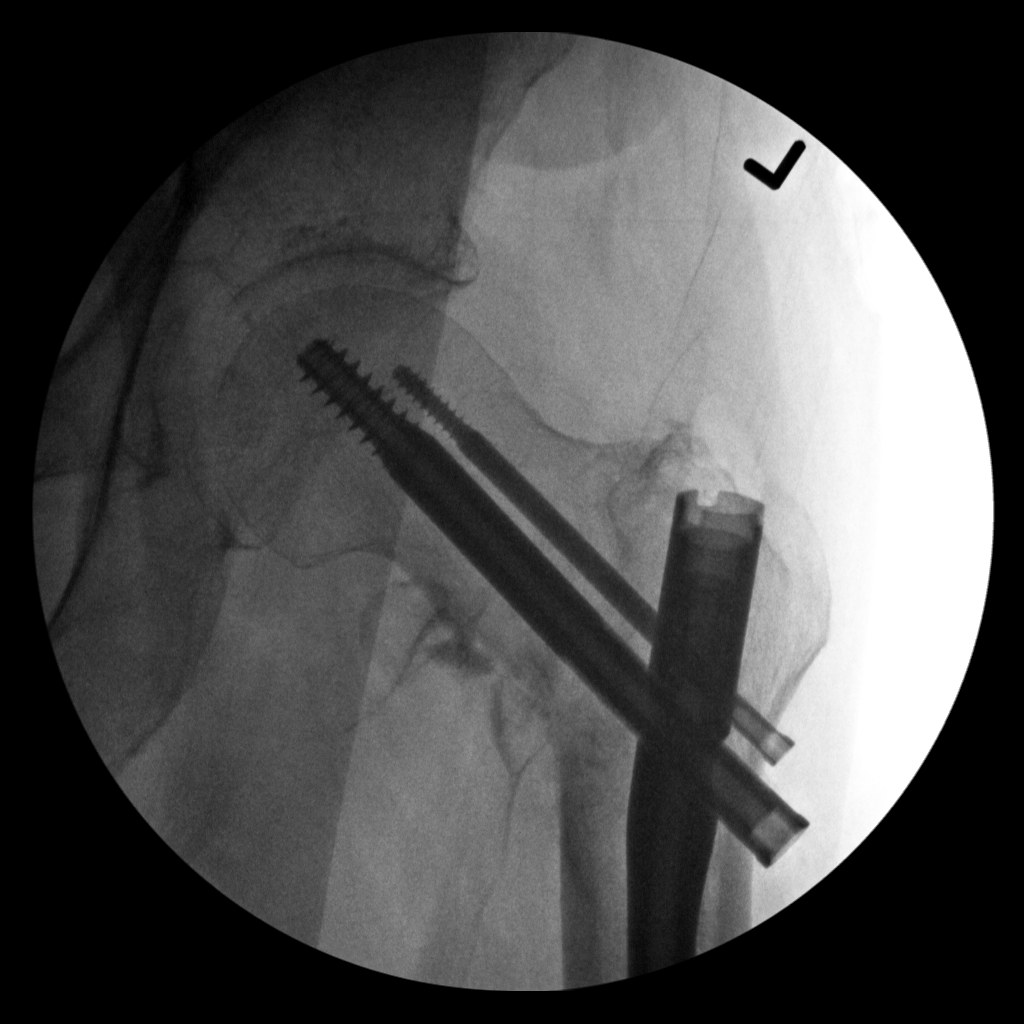
[im 4/5]
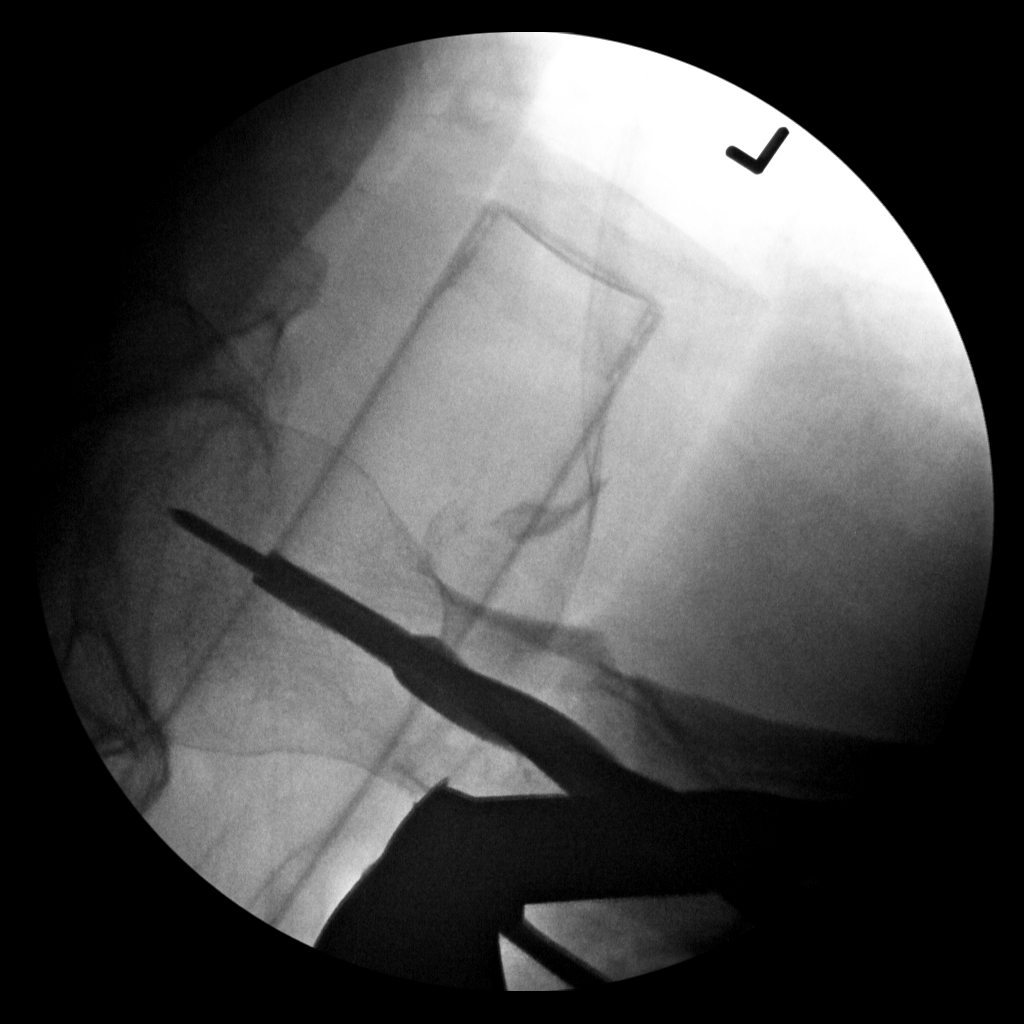
[im 5/5]
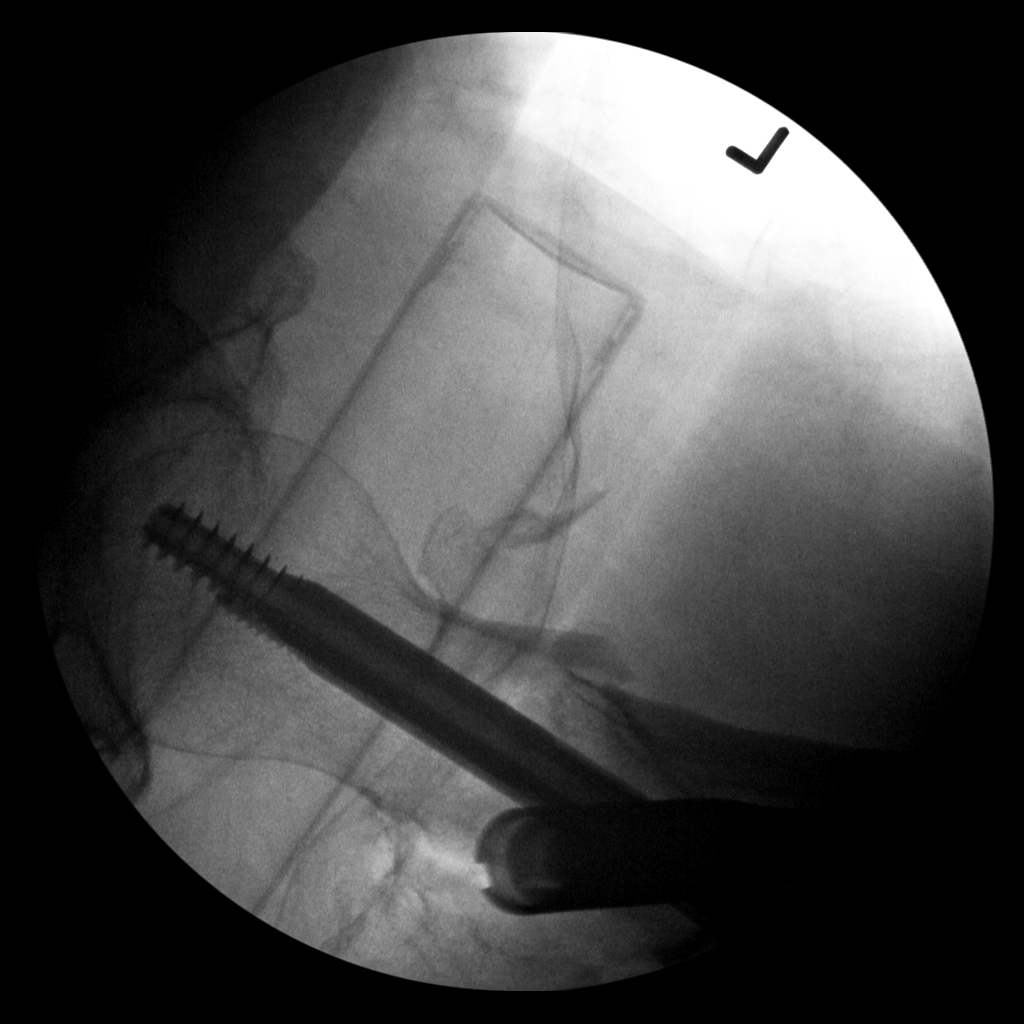

[5 of 5 positions shown; findings below may reference images not displayed]

FINDINGS: An intramedullary rod has been placed in the proximal left femur. A
distal interlocking screw has been placed. Two gamma nails are in
place.
IMPRESSION: Left hip fracture repair as above.

## 2020-12-09 MED ORDER — SODIUM CHLORIDE 0.9% FLUSH
10.0000 mL | Freq: Once | INTRAVENOUS | Status: AC
  Administered 2020-12-09: 10 mL
  Filled 2020-12-09: qty 10

## 2020-12-09 MED ORDER — HEPARIN SOD (PORK) LOCK FLUSH 100 UNIT/ML IV SOLN
500.0000 [IU] | Freq: Once | INTRAVENOUS | Status: AC
  Administered 2020-12-09: 500 [IU]
  Filled 2020-12-09: qty 5

## 2020-12-09 NOTE — Patient Instructions (Signed)

## 2021-01-18 DIAGNOSIS — H43813 Vitreous degeneration, bilateral: Secondary | ICD-10-CM | POA: Diagnosis not present

## 2021-01-18 DIAGNOSIS — H34813 Central retinal vein occlusion, bilateral, with macular edema: Secondary | ICD-10-CM | POA: Diagnosis not present

## 2021-01-18 DIAGNOSIS — H35372 Puckering of macula, left eye: Secondary | ICD-10-CM | POA: Diagnosis not present

## 2021-01-18 DIAGNOSIS — H3582 Retinal ischemia: Secondary | ICD-10-CM | POA: Diagnosis not present

## 2021-01-25 ENCOUNTER — Ambulatory Visit (INDEPENDENT_AMBULATORY_CARE_PROVIDER_SITE_OTHER): Payer: Medicare HMO | Admitting: Dermatology

## 2021-01-25 ENCOUNTER — Other Ambulatory Visit: Payer: Self-pay

## 2021-01-25 ENCOUNTER — Encounter: Payer: Self-pay | Admitting: Dermatology

## 2021-01-25 DIAGNOSIS — L570 Actinic keratosis: Secondary | ICD-10-CM

## 2021-01-25 DIAGNOSIS — Z85828 Personal history of other malignant neoplasm of skin: Secondary | ICD-10-CM | POA: Diagnosis not present

## 2021-01-25 DIAGNOSIS — H61002 Unspecified perichondritis of left external ear: Secondary | ICD-10-CM | POA: Diagnosis not present

## 2021-02-06 ENCOUNTER — Encounter: Payer: Self-pay | Admitting: Dermatology

## 2021-02-06 NOTE — Progress Notes (Signed)
   Follow-Up Visit   Subjective  [name] [name] is a 80 y.o. male who presents for the following: Follow-up (LEFT EAR TENDER LESION ).  Check several skin spots including left ear which had been painful but seems to be improving Location:  Duration:  Quality:  Associated Signs/Symptoms: Modifying Factors:  Severity:  Timing: Context:   Objective  Well appearing patient in no apparent distress; mood and affect are within normal limits. Objective  Left Supraorbital Region, Right Eyebrow, Right Forehead, Right Temple: Multiple gritty pink crusts  Objective  Left Postauricular Area: No sign recurrent nonmelanoma skin cancer.  Objective  Left Ear: Subtle nontender on the road to millimeter pink subepidermal thickening compatible with CNH.  No sign of recurrence in the retroauricular sulcus.    A focused examination was performed including Head and neck.. Relevant physical exam findings are noted in the Assessment and Plan.   Assessment & Plan    AK (actinic keratosis) (4) Right Eyebrow; Left Supraorbital Region; Right Temple; Right Forehead  Destruction of lesion - Left Supraorbital Region, Right Eyebrow, Right Forehead, Right Temple Complexity: simple   Destruction method: cryotherapy   Informed consent: discussed and consent obtained   Timeout:  patient name, date of birth, surgical site, and procedure verified Lesion destroyed using liquid nitrogen: Yes   Cryotherapy cycles:  3 Outcome: patient tolerated procedure well with no complications   Post-procedure details: wound care instructions given    Personal history of skin cancer Left Postauricular Area  Recheck as needed change  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      I, Lavonna Monarch, MD, have reviewed all documentation for this visit.  The documentation on 02/06/21 for the exam, diagnosis, procedures, and orders are all accurate and  complete.

## 2021-03-03 ENCOUNTER — Ambulatory Visit: Payer: Self-pay | Admitting: Dermatology

## 2021-03-30 DIAGNOSIS — H43813 Vitreous degeneration, bilateral: Secondary | ICD-10-CM | POA: Diagnosis not present

## 2021-03-30 DIAGNOSIS — H35372 Puckering of macula, left eye: Secondary | ICD-10-CM | POA: Diagnosis not present

## 2021-03-30 DIAGNOSIS — H34813 Central retinal vein occlusion, bilateral, with macular edema: Secondary | ICD-10-CM | POA: Diagnosis not present

## 2021-06-08 DIAGNOSIS — H43813 Vitreous degeneration, bilateral: Secondary | ICD-10-CM | POA: Diagnosis not present

## 2021-06-08 DIAGNOSIS — H35372 Puckering of macula, left eye: Secondary | ICD-10-CM | POA: Diagnosis not present

## 2021-06-08 DIAGNOSIS — H34813 Central retinal vein occlusion, bilateral, with macular edema: Secondary | ICD-10-CM | POA: Diagnosis not present

## 2021-07-11 DIAGNOSIS — H524 Presbyopia: Secondary | ICD-10-CM | POA: Diagnosis not present

## 2021-07-11 DIAGNOSIS — H35053 Retinal neovascularization, unspecified, bilateral: Secondary | ICD-10-CM | POA: Diagnosis not present

## 2021-07-11 DIAGNOSIS — H349 Unspecified retinal vascular occlusion: Secondary | ICD-10-CM | POA: Diagnosis not present

## 2021-07-11 DIAGNOSIS — Z961 Presence of intraocular lens: Secondary | ICD-10-CM | POA: Diagnosis not present

## 2021-08-24 DIAGNOSIS — H35372 Puckering of macula, left eye: Secondary | ICD-10-CM | POA: Diagnosis not present

## 2021-08-24 DIAGNOSIS — H43813 Vitreous degeneration, bilateral: Secondary | ICD-10-CM | POA: Diagnosis not present

## 2021-08-24 DIAGNOSIS — H34813 Central retinal vein occlusion, bilateral, with macular edema: Secondary | ICD-10-CM | POA: Diagnosis not present

## 2021-08-24 DIAGNOSIS — H3582 Retinal ischemia: Secondary | ICD-10-CM | POA: Diagnosis not present

## 2021-08-25 ENCOUNTER — Inpatient Hospital Stay: Payer: Medicare HMO

## 2021-08-25 ENCOUNTER — Inpatient Hospital Stay: Payer: Medicare HMO | Admitting: Internal Medicine

## 2021-09-27 ENCOUNTER — Inpatient Hospital Stay: Payer: Medicare HMO | Attending: Internal Medicine

## 2021-09-27 ENCOUNTER — Other Ambulatory Visit: Payer: Self-pay | Admitting: Medical Oncology

## 2021-09-27 ENCOUNTER — Other Ambulatory Visit: Payer: Self-pay

## 2021-09-27 ENCOUNTER — Encounter: Payer: Self-pay | Admitting: Internal Medicine

## 2021-09-27 ENCOUNTER — Inpatient Hospital Stay: Payer: Medicare HMO | Admitting: Internal Medicine

## 2021-09-27 ENCOUNTER — Inpatient Hospital Stay: Payer: Medicare HMO

## 2021-09-27 VITALS — BP 120/74 | HR 101 | Temp 97.5°F | Resp 18 | Wt 175.7 lb

## 2021-09-27 DIAGNOSIS — C8251 Diffuse follicle center lymphoma, lymph nodes of head, face, and neck: Secondary | ICD-10-CM

## 2021-09-27 DIAGNOSIS — Z9221 Personal history of antineoplastic chemotherapy: Secondary | ICD-10-CM | POA: Insufficient documentation

## 2021-09-27 DIAGNOSIS — Z452 Encounter for adjustment and management of vascular access device: Secondary | ICD-10-CM | POA: Diagnosis not present

## 2021-09-27 DIAGNOSIS — Z95828 Presence of other vascular implants and grafts: Secondary | ICD-10-CM

## 2021-09-27 DIAGNOSIS — Z85828 Personal history of other malignant neoplasm of skin: Secondary | ICD-10-CM | POA: Insufficient documentation

## 2021-09-27 DIAGNOSIS — Z79899 Other long term (current) drug therapy: Secondary | ICD-10-CM | POA: Insufficient documentation

## 2021-09-27 DIAGNOSIS — Z8572 Personal history of non-Hodgkin lymphomas: Secondary | ICD-10-CM | POA: Diagnosis present

## 2021-09-27 DIAGNOSIS — I10 Essential (primary) hypertension: Secondary | ICD-10-CM | POA: Diagnosis not present

## 2021-09-27 LAB — CMP (CANCER CENTER ONLY)
ALT: 21 U/L (ref 0–44)
AST: 22 U/L (ref 15–41)
Albumin: 4.2 g/dL (ref 3.5–5.0)
Alkaline Phosphatase: 65 U/L (ref 38–126)
Anion gap: 7 (ref 5–15)
BUN: 15 mg/dL (ref 8–23)
CO2: 27 mmol/L (ref 22–32)
Calcium: 9.4 mg/dL (ref 8.9–10.3)
Chloride: 105 mmol/L (ref 98–111)
Creatinine: 1.07 mg/dL (ref 0.61–1.24)
GFR, Estimated: >60 mL/min (ref >60)
Glucose, Bld: 112 mg/dL — ABNORMAL HIGH (ref 70–99)
Potassium: 4.2 mmol/L (ref 3.5–5.1)
Sodium: 139 mmol/L (ref 135–145)
Total Bilirubin: 0.8 mg/dL (ref 0.3–1.2)
Total Protein: 7.6 g/dL (ref 6.5–8.1)

## 2021-09-27 LAB — CBC WITH DIFFERENTIAL (CANCER CENTER ONLY)
Abs Immature Granulocytes: 0.02 10*3/uL (ref 0.00–0.07)
Basophils Absolute: 0.1 10*3/uL (ref 0.0–0.1)
Basophils Relative: 1 %
Eosinophils Absolute: 0.1 10*3/uL (ref 0.0–0.5)
Eosinophils Relative: 2 %
HCT: 42.8 % (ref 39.0–52.0)
Hemoglobin: 14.1 g/dL (ref 13.0–17.0)
Immature Granulocytes: 0 %
Lymphocytes Relative: 23 %
Lymphs Abs: 1.8 10*3/uL (ref 0.7–4.0)
MCH: 28.4 pg (ref 26.0–34.0)
MCHC: 32.9 g/dL (ref 30.0–36.0)
MCV: 86.1 fL (ref 80.0–100.0)
Monocytes Absolute: 0.4 10*3/uL (ref 0.1–1.0)
Monocytes Relative: 5 %
Neutro Abs: 5.4 10*3/uL (ref 1.7–7.7)
Neutrophils Relative %: 69 %
Platelet Count: 223 10*3/uL (ref 150–400)
RBC: 4.97 MIL/uL (ref 4.22–5.81)
RDW: 13.7 % (ref 11.5–15.5)
WBC Count: 7.8 10*3/uL (ref 4.0–10.5)
nRBC: 0 % (ref 0.0–0.2)

## 2021-09-27 LAB — LACTATE DEHYDROGENASE: LDH: 194 U/L — ABNORMAL HIGH (ref 98–192)

## 2021-09-27 MED ORDER — HEPARIN SOD (PORK) LOCK FLUSH 100 UNIT/ML IV SOLN
500.0000 [IU] | Freq: Once | INTRAVENOUS | Status: AC
  Administered 2021-09-27: 500 [IU]

## 2021-09-27 MED ORDER — SODIUM CHLORIDE 0.9% FLUSH
10.0000 mL | Freq: Once | INTRAVENOUS | Status: AC
  Administered 2021-09-27: 10 mL

## 2021-09-27 NOTE — Progress Notes (Signed)
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  CURRENT THERAPY: Observation.   INTERVAL HISTORY: [name] [name] 81 y.o. male returns to the clinic today for annual follow-up visit.  The patient is feeling fine today with no concerning complaints except for the generalized fatigue and arthralgia.  He denied having any current chest pain, shortness of breath, cough or hemoptysis.  He denied having any fever or chills.  He has no nausea, vomiting, diarrhea or constipation.  He has no headache or visual changes.  He is here today for evaluation and repeat blood work.   MEDICAL HISTORY: Past Medical History:  Diagnosis Date   Basal cell carcinoma 06/08/1998   Upper lip - CX3+5FU   Basal cell carcinoma 06/22/1998   Left front neck - CX3+5FU   Basal cell carcinoma 03/11/2004   Right temple [facility])   Basal cell carcinoma 04/24/2016   Left top shoulder - Clear   Basal cell carcinoma 01/14/2018   Right sideburn - CX3+5FU   Basal cell carcinoma 06/08/2020   sup & nod- left postauricular sulcus   Femur fracture (HCC)    Hip fracture (HCC)    Hypertension    Malignant neoplasm of pancreas, part unspecified    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   Right Ear Superior   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   Right Sideburn   Other malignant lymphomas, unspecified site, extranodal and solid organ sites    Squamous cell carcinoma of skin 09/26/2001   Left forehead -  CX3+excision   Squamous cell carcinoma of skin 03/11/2004   Post right scalp - CX3+5FU   Squamous cell carcinoma of skin 03/11/2004   Left forehead (MOHs)   Squamous cell carcinoma of skin 03/11/2004   Right sideburn - CX3+5FU+excision   Squamous cell carcinoma of skin 12/01/2004   Left temple - CX3+excision   Squamous cell carcinoma of skin 04/13/2006   Mid scalp, sup - tx p bx   Squamous cell carcinoma of skin 04/13/2006   Mid scalp, inf - tx p bx   Squamous cell carcinoma of skin 04/13/2006   Left temple - tx p bx   Squamous cell carcinoma of skin 06/30/2010   Left low back - tx p bx   Squamous cell carcinoma of skin 06/23/2013   Front scalp - CX3+5FU   Squamous cell carcinoma of skin 01/14/2018   Left scalp - CX3+5FU    ALLERGIES:  is allergic to morphine and related, shellfish allergy, iohexol, other, and sulfa antibiotics.  MEDICATIONS:  Current Outpatient Medications  Medication Sig Dispense Refill   amLODipine (NORVASC) 5 MG tablet Take 5 mg by mouth daily.     Ascorbic Acid (VITAMIN C) 1000 MG tablet Take 1,000 mg by mouth daily. Pt states he takes 6,000mg      cholecalciferol (VITAMIN D) 1000 UNITS tablet Take 15,000 Units by mouth daily.      ketoconazole (NIZORAL) 2 % cream APPLY TOPICALLY ONCE DAILY FOR 14 DAYS     lisinopril (ZESTRIL) 20 MG tablet Take 20 mg by  mouth daily.     magnesium oxide (MAG-OX) 400 MG tablet Take 400 mg by mouth daily. "I take 500mg /day"     Multiple Vitamin (MULTIVITAMIN) tablet Take 6 tablets by mouth daily.     Probiotic Product (PROBIOTIC FORMULA PO) Take 1 tablet by mouth daily.     rosuvastatin (CRESTOR) 5 MG tablet Take 5 mg by mouth once a week.     tamsulosin (FLOMAX) 0.4 MG CAPS capsule Take 0.4 mg by mouth daily.     Turmeric (QC TUMERIC COMPLEX PO) Take 1 tablet by mouth daily.     Zinc 30 MG TABS Take 1 tablet by mouth every morning. Pt states he takes 56mcg.     Aflibercept 2 MG/0.05ML SOLN Inject 0.5 mLs into the eye as  directed. Take every 6 wks in eye doctor office.     No current facility-administered medications for this visit.   Facility-Administered Medications Ordered in Other Visits  Medication Dose Route Frequency Provider Last Rate Last Admin   heparin lock flush 100 unit/mL  500 Units Intravenous Once PRN Curt Bears, MD       sodium chloride 0.9 % injection 10 mL  10 mL Intravenous PRN Curt Bears, MD   10 mL at 12/29/14 1026   sodium chloride 0.9 % injection 10 mL  10 mL Intravenous PRN Curt Bears, MD        SURGICAL HISTORY:  Past Surgical History:  Procedure Laterality Date   FEMUR IM NAIL Left 10/06/2019   Procedure: INTRAMEDULLARY (IM) RETROGRADE FEMORAL NAIL REMOVAL ( [employer]);  Surgeon: Marybelle Killings, MD;  Location: [place];  Service: Orthopedics;  Laterality: Left;   FEMUR SURGERY Bilateral    INTRAMEDULLARY (IM) NAIL INTERTROCHANTERIC Left 10/06/2019   Procedure: BIOMET AFFIXUS SHORT NAIL,  INTERTROCHANTRIC;  Surgeon: Marybelle Killings, MD;  Location: [place];  Service: Orthopedics;  Laterality: Left;   LEG SURGERY Left    SHOULDER SURGERY Left     REVIEW OF SYSTEMS:  A comprehensive review of systems was negative except for: Constitutional: positive for fatigue Musculoskeletal: positive for arthralgias   PHYSICAL EXAMINATION: General appearance: alert, cooperative and no distress Head: Normocephalic, without obvious abnormality, atraumatic Neck: no adenopathy, no JVD, supple, symmetrical, trachea midline and thyroid not enlarged, symmetric, no tenderness/mass/nodules Lymph nodes: Cervical, supraclavicular, and axillary nodes normal. Resp: clear to auscultation bilaterally Back: symmetric, no curvature. ROM normal. No CVA tenderness. Cardio: regular rate and rhythm, S1, S2 normal, no murmur, click, rub or gallop GI: soft, non-tender; bowel sounds normal; no masses,  no organomegaly Extremities: extremities normal, atraumatic, no cyanosis or edema  ECOG  PERFORMANCE STATUS: 1 - Symptomatic but completely ambulatory  Blood pressure 120/74, pulse (!) 101, temperature (!) 97.5 F (36.4 C), temperature source Oral, resp. rate 18, weight 175 lb 11.2 oz (79.7 kg), SpO2 97 %.  LABORATORY DATA: Lab Results  Component Value Date   WBC 7.8 09/27/2021   HGB 14.1 09/27/2021   HCT 42.8 09/27/2021   MCV 86.1 09/27/2021   PLT 223 09/27/2021      Chemistry      Component Value Date/Time   NA 139 08/25/2020 1048   NA 140 01/23/2017 0958   K 4.2 08/25/2020 1048   K 4.0 01/23/2017 0958   CL 107 08/25/2020 1048   CL 103 11/21/2012 0930   CO2 23 08/25/2020 1048   CO2 28 01/23/2017 0958   BUN 16 08/25/2020 1048   BUN 17.0 01/23/2017 0958   CREATININE  1.40 (H) 08/25/2020 1048   CREATININE 1.3 01/23/2017 0958      Component Value Date/Time   CALCIUM 9.4 08/25/2020 1048   CALCIUM 10.2 01/23/2017 0958   ALKPHOS 83 08/25/2020 1048   ALKPHOS 61 01/23/2017 0958   AST 24 08/25/2020 1048   AST 20 01/23/2017 0958   ALT 20 08/25/2020 1048   ALT 21 01/23/2017 0958   BILITOT 1.2 08/25/2020 1048   BILITOT 1.14 01/23/2017 0958       RADIOGRAPHIC STUDIES: No results found.  ASSESSMENT AND PLAN:  This is a very pleasant 81 years old white male with history of large B-cell non-Hodgkin lymphoma presented with a large mass close to the head of the pancreas status post systemic chemotherapy with CHOP/Rituxan for 4 cycles in addition to hyperthermic therapy in [place] with almost complete response of his disease. The patient has been in observation for the last 13 years and he is doing fine with no concerning complaints. Repeat CBC today is unremarkable.  Comprehensive metabolic panel and LDH are still pending. I recommended for the patient to continue on observation with repeat blood work in 1 year. Will arrange for the patient to have Port-A-Cath flush every 2 months. He was advised to call immediately if he has any concerning symptoms in the  interval. The patient voices understanding of current disease status and treatment options and is in agreement with the current care plan. All questions were answered. The patient knows to call the clinic with any problems, questions or concerns. We can certainly see the patient much sooner if necessary.  Disclaimer: This note was dictated with voice recognition software. Similar sounding words can inadvertently be transcribed and may not be corrected upon review.

## 2021-10-11 ENCOUNTER — Other Ambulatory Visit: Payer: Self-pay

## 2021-10-11 ENCOUNTER — Ambulatory Visit: Payer: Medicare HMO | Admitting: Dermatology

## 2021-10-11 ENCOUNTER — Encounter: Payer: Self-pay | Admitting: Dermatology

## 2021-10-11 DIAGNOSIS — D044 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: Secondary | ICD-10-CM | POA: Diagnosis not present

## 2021-10-11 DIAGNOSIS — L570 Actinic keratosis: Secondary | ICD-10-CM

## 2021-10-11 DIAGNOSIS — Z85828 Personal history of other malignant neoplasm of skin: Secondary | ICD-10-CM

## 2021-10-11 DIAGNOSIS — D489 Neoplasm of uncertain behavior, unspecified: Secondary | ICD-10-CM

## 2021-10-11 NOTE — Patient Instructions (Signed)

## 2021-10-28 ENCOUNTER — Inpatient Hospital Stay (HOSPITAL_COMMUNITY): Payer: Medicare HMO

## 2021-10-28 ENCOUNTER — Other Ambulatory Visit: Payer: Self-pay

## 2021-10-28 ENCOUNTER — Encounter (HOSPITAL_COMMUNITY)
Admission: EM | Disposition: A | Discharge status: Skilled Nursing Facility | Payer: Self-pay | Source: Home / Self Care | Attending: Internal Medicine

## 2021-10-28 ENCOUNTER — Inpatient Hospital Stay (HOSPITAL_COMMUNITY)
Admission: EM | Admit: 2021-10-28 | Discharge: 2021-11-10 | DRG: 481 | Disposition: A | Discharge status: Skilled Nursing Facility | Payer: Medicare HMO | Attending: Internal Medicine | Admitting: Internal Medicine

## 2021-10-28 ENCOUNTER — Inpatient Hospital Stay (HOSPITAL_COMMUNITY): Payer: Medicare HMO | Admitting: Anesthesiology

## 2021-10-28 ENCOUNTER — Encounter (HOSPITAL_COMMUNITY): Payer: Self-pay | Admitting: Emergency Medicine

## 2021-10-28 ENCOUNTER — Emergency Department (HOSPITAL_COMMUNITY): Payer: Medicare HMO

## 2021-10-28 DIAGNOSIS — R1310 Dysphagia, unspecified: Secondary | ICD-10-CM | POA: Diagnosis not present

## 2021-10-28 DIAGNOSIS — Z91013 Allergy to seafood: Secondary | ICD-10-CM

## 2021-10-28 DIAGNOSIS — I10 Essential (primary) hypertension: Secondary | ICD-10-CM | POA: Diagnosis present

## 2021-10-28 DIAGNOSIS — Z882 Allergy status to sulfonamides status: Secondary | ICD-10-CM | POA: Diagnosis not present

## 2021-10-28 DIAGNOSIS — D62 Acute posthemorrhagic anemia: Secondary | ICD-10-CM | POA: Diagnosis not present

## 2021-10-28 DIAGNOSIS — W19XXXA Unspecified fall, initial encounter: Secondary | ICD-10-CM | POA: Diagnosis not present

## 2021-10-28 DIAGNOSIS — R079 Chest pain, unspecified: Secondary | ICD-10-CM | POA: Diagnosis not present

## 2021-10-28 DIAGNOSIS — Z8572 Personal history of non-Hodgkin lymphomas: Secondary | ICD-10-CM

## 2021-10-28 DIAGNOSIS — S72001A Fracture of unspecified part of neck of right femur, initial encounter for closed fracture: Secondary | ICD-10-CM

## 2021-10-28 DIAGNOSIS — Y92009 Unspecified place in unspecified non-institutional (private) residence as the place of occurrence of the external cause: Secondary | ICD-10-CM

## 2021-10-28 DIAGNOSIS — M1990 Unspecified osteoarthritis, unspecified site: Secondary | ICD-10-CM

## 2021-10-28 DIAGNOSIS — Z20822 Contact with and (suspected) exposure to covid-19: Secondary | ICD-10-CM | POA: Diagnosis present

## 2021-10-28 DIAGNOSIS — S72141A Displaced intertrochanteric fracture of right femur, initial encounter for closed fracture: Principal | ICD-10-CM

## 2021-10-28 DIAGNOSIS — S72301A Unspecified fracture of shaft of right femur, initial encounter for closed fracture: Secondary | ICD-10-CM | POA: Diagnosis not present

## 2021-10-28 DIAGNOSIS — K5900 Constipation, unspecified: Secondary | ICD-10-CM

## 2021-10-28 DIAGNOSIS — K2100 Gastro-esophageal reflux disease with esophagitis, without bleeding: Secondary | ICD-10-CM | POA: Diagnosis present

## 2021-10-28 DIAGNOSIS — W010XXA Fall on same level from slipping, tripping and stumbling without subsequent striking against object, initial encounter: Secondary | ICD-10-CM | POA: Diagnosis not present

## 2021-10-28 DIAGNOSIS — Z85828 Personal history of other malignant neoplasm of skin: Secondary | ICD-10-CM | POA: Diagnosis not present

## 2021-10-28 DIAGNOSIS — Z9889 Other specified postprocedural states: Secondary | ICD-10-CM | POA: Diagnosis not present

## 2021-10-28 DIAGNOSIS — Z419 Encounter for procedure for purposes other than remedying health state, unspecified: Secondary | ICD-10-CM

## 2021-10-28 DIAGNOSIS — Z91041 Radiographic dye allergy status: Secondary | ICD-10-CM | POA: Diagnosis not present

## 2021-10-28 DIAGNOSIS — E871 Hypo-osmolality and hyponatremia: Secondary | ICD-10-CM | POA: Diagnosis not present

## 2021-10-28 DIAGNOSIS — Z885 Allergy status to narcotic agent status: Secondary | ICD-10-CM | POA: Diagnosis not present

## 2021-10-28 DIAGNOSIS — S3993XA Unspecified injury of pelvis, initial encounter: Secondary | ICD-10-CM | POA: Diagnosis not present

## 2021-10-28 DIAGNOSIS — Z8507 Personal history of malignant neoplasm of pancreas: Secondary | ICD-10-CM

## 2021-10-28 DIAGNOSIS — M25551 Pain in right hip: Secondary | ICD-10-CM | POA: Diagnosis not present

## 2021-10-28 DIAGNOSIS — R1110 Vomiting, unspecified: Secondary | ICD-10-CM

## 2021-10-28 DIAGNOSIS — S79911A Unspecified injury of right hip, initial encounter: Secondary | ICD-10-CM | POA: Diagnosis not present

## 2021-10-28 DIAGNOSIS — Z472 Encounter for removal of internal fixation device: Secondary | ICD-10-CM | POA: Diagnosis not present

## 2021-10-28 DIAGNOSIS — K219 Gastro-esophageal reflux disease without esophagitis: Secondary | ICD-10-CM

## 2021-10-28 DIAGNOSIS — S299XXA Unspecified injury of thorax, initial encounter: Secondary | ICD-10-CM | POA: Diagnosis not present

## 2021-10-28 DIAGNOSIS — R109 Unspecified abdominal pain: Secondary | ICD-10-CM

## 2021-10-28 DIAGNOSIS — Z79899 Other long term (current) drug therapy: Secondary | ICD-10-CM | POA: Diagnosis not present

## 2021-10-28 DIAGNOSIS — K2210 Ulcer of esophagus without bleeding: Secondary | ICD-10-CM | POA: Diagnosis not present

## 2021-10-28 DIAGNOSIS — K2950 Unspecified chronic gastritis without bleeding: Secondary | ICD-10-CM | POA: Diagnosis not present

## 2021-10-28 DIAGNOSIS — T148XXA Other injury of unspecified body region, initial encounter: Secondary | ICD-10-CM

## 2021-10-28 DIAGNOSIS — K222 Esophageal obstruction: Secondary | ICD-10-CM

## 2021-10-28 DIAGNOSIS — Z743 Need for continuous supervision: Secondary | ICD-10-CM | POA: Diagnosis not present

## 2021-10-28 DIAGNOSIS — C8590 Non-Hodgkin lymphoma, unspecified, unspecified site: Secondary | ICD-10-CM | POA: Diagnosis not present

## 2021-10-28 HISTORY — DX: Other complications of anesthesia, initial encounter: T88.59XA

## 2021-10-28 HISTORY — PX: HARDWARE REMOVAL: SHX979

## 2021-10-28 HISTORY — PX: INTRAMEDULLARY (IM) NAIL INTERTROCHANTERIC: SHX5875

## 2021-10-28 LAB — CBC WITH DIFFERENTIAL/PLATELET
Abs Immature Granulocytes: 0.04 10*3/uL (ref 0.00–0.07)
Basophils Absolute: 0 10*3/uL (ref 0.0–0.1)
Basophils Relative: 0 %
Eosinophils Absolute: 0.1 10*3/uL (ref 0.0–0.5)
Eosinophils Relative: 1 %
HCT: 42.6 % (ref 39.0–52.0)
Hemoglobin: 13.6 g/dL (ref 13.0–17.0)
Immature Granulocytes: 0 %
Lymphocytes Relative: 14 %
Lymphs Abs: 1.5 10*3/uL (ref 0.7–4.0)
MCH: 28.4 pg (ref 26.0–34.0)
MCHC: 31.9 g/dL (ref 30.0–36.0)
MCV: 88.9 fL (ref 80.0–100.0)
Monocytes Absolute: 0.4 10*3/uL (ref 0.1–1.0)
Monocytes Relative: 4 %
Neutro Abs: 8.4 10*3/uL — ABNORMAL HIGH (ref 1.7–7.7)
Neutrophils Relative %: 81 %
Platelets: 164 10*3/uL (ref 150–400)
RBC: 4.79 MIL/uL (ref 4.22–5.81)
RDW: 14.3 % (ref 11.5–15.5)
WBC: 10.5 10*3/uL (ref 4.0–10.5)
nRBC: 0 % (ref 0.0–0.2)

## 2021-10-28 LAB — CBC
HCT: 39 % (ref 39.0–52.0)
Hemoglobin: 13 g/dL (ref 13.0–17.0)
MCH: 29.1 pg (ref 26.0–34.0)
MCHC: 33.3 g/dL (ref 30.0–36.0)
MCV: 87.2 fL (ref 80.0–100.0)
Platelets: 147 10*3/uL — ABNORMAL LOW (ref 150–400)
RBC: 4.47 MIL/uL (ref 4.22–5.81)
RDW: 14.1 % (ref 11.5–15.5)
WBC: 17.2 10*3/uL — ABNORMAL HIGH (ref 4.0–10.5)
nRBC: 0 % (ref 0.0–0.2)

## 2021-10-28 LAB — SURGICAL PCR SCREEN
MRSA, PCR: NEGATIVE
Staphylococcus aureus: NEGATIVE

## 2021-10-28 LAB — COMPREHENSIVE METABOLIC PANEL
ALT: 17 U/L (ref 0–44)
AST: 26 U/L (ref 15–41)
Albumin: 3.9 g/dL (ref 3.5–5.0)
Alkaline Phosphatase: 63 U/L (ref 38–126)
Anion gap: 10 (ref 5–15)
BUN: 20 mg/dL (ref 8–23)
CO2: 24 mmol/L (ref 22–32)
Calcium: 9.3 mg/dL (ref 8.9–10.3)
Chloride: 104 mmol/L (ref 98–111)
Creatinine, Ser: 1.07 mg/dL (ref 0.61–1.24)
GFR, Estimated: >60 mL/min (ref >60)
Glucose, Bld: 143 mg/dL — ABNORMAL HIGH (ref 70–99)
Potassium: 4 mmol/L (ref 3.5–5.1)
Sodium: 138 mmol/L (ref 135–145)
Total Bilirubin: 1.4 mg/dL — ABNORMAL HIGH (ref 0.3–1.2)
Total Protein: 6.8 g/dL (ref 6.5–8.1)

## 2021-10-28 LAB — CREATININE, SERUM
Creatinine, Ser: 1.07 mg/dL (ref 0.61–1.24)
GFR, Estimated: >60 mL/min (ref >60)

## 2021-10-28 LAB — RESP PANEL BY RT-PCR (FLU A&B, COVID) ARPGX2
Influenza A by PCR: NEGATIVE
Influenza B by PCR: NEGATIVE
SARS Coronavirus 2 by RT PCR: NEGATIVE

## 2021-10-28 LAB — VITAMIN D 25 HYDROXY (VIT D DEFICIENCY, FRACTURES): Vit D, 25-Hydroxy: 66.02 ng/mL (ref 30–100)

## 2021-10-28 SURGERY — FIXATION, FRACTURE, INTERTROCHANTERIC, WITH INTRAMEDULLARY ROD
Anesthesia: General | Laterality: Right

## 2021-10-28 MED ORDER — FENTANYL CITRATE PF 50 MCG/ML IJ SOSY
50.0000 ug | PREFILLED_SYRINGE | Freq: Once | INTRAMUSCULAR | Status: DC
  Administered 2021-10-28: 50 ug via INTRAVENOUS
  Filled 2021-10-28: qty 1

## 2021-10-28 MED ORDER — LIDOCAINE 2% (20 MG/ML) 5 ML SYRINGE
INTRAMUSCULAR | Status: DC | PRN
  Administered 2021-10-28: 60 mg via INTRAVENOUS

## 2021-10-28 MED ORDER — PHENYLEPHRINE 40 MCG/ML (10ML) SYRINGE FOR IV PUSH (FOR BLOOD PRESSURE SUPPORT)
PREFILLED_SYRINGE | INTRAVENOUS | Status: AC
  Filled 2021-10-28: qty 10

## 2021-10-28 MED ORDER — FENTANYL CITRATE (PF) 100 MCG/2ML IJ SOLN: 50.0000 ug | Freq: Once | INTRAMUSCULAR | Status: AC

## 2021-10-28 MED ORDER — POVIDONE-IODINE 10 % EX SWAB
2.0000 "application " | Freq: Once | CUTANEOUS | Status: AC
  Administered 2021-10-28: 2 via TOPICAL

## 2021-10-28 MED ORDER — CHLORHEXIDINE GLUCONATE 4 % EX LIQD: 60.0000 mL | Freq: Once | CUTANEOUS | Status: DC

## 2021-10-28 MED ORDER — ONDANSETRON HCL 4 MG PO TABS: 4.0000 mg | ORAL_TABLET | Freq: Four times a day (QID) | ORAL | Status: DC | PRN

## 2021-10-28 MED ORDER — VANCOMYCIN HCL 1000 MG IV SOLR
INTRAVENOUS | Status: AC
  Filled 2021-10-28: qty 20

## 2021-10-28 MED ORDER — PHENYLEPHRINE 40 MCG/ML (10ML) SYRINGE FOR IV PUSH (FOR BLOOD PRESSURE SUPPORT)
PREFILLED_SYRINGE | INTRAVENOUS | Status: AC
  Filled 2021-10-28: qty 20

## 2021-10-28 MED ORDER — SENNOSIDES-DOCUSATE SODIUM 8.6-50 MG PO TABS: 1.0000 | ORAL_TABLET | Freq: Every evening | ORAL | Status: DC | PRN

## 2021-10-28 MED ORDER — KETOROLAC TROMETHAMINE 15 MG/ML IJ SOLN
15.0000 mg | Freq: Three times a day (TID) | INTRAMUSCULAR | Status: AC | PRN
  Administered 2021-10-28: 15 mg via INTRAVENOUS
  Filled 2021-10-28: qty 1

## 2021-10-28 MED ORDER — FENTANYL CITRATE (PF) 250 MCG/5ML IJ SOLN
INTRAMUSCULAR | Status: DC | PRN
  Administered 2021-10-28 (×4): 50 ug via INTRAVENOUS

## 2021-10-28 MED ORDER — PHENYLEPHRINE 40 MCG/ML (10ML) SYRINGE FOR IV PUSH (FOR BLOOD PRESSURE SUPPORT)
PREFILLED_SYRINGE | INTRAVENOUS | Status: DC | PRN
  Administered 2021-10-28 (×4): 120 ug via INTRAVENOUS

## 2021-10-28 MED ORDER — LISINOPRIL 20 MG PO TABS
20.0000 mg | ORAL_TABLET | Freq: Every day | ORAL | Status: DC
Start: 2021-10-28 — End: 2021-11-11
  Administered 2021-10-28 – 2021-11-10 (×14): 20 mg via ORAL
  Filled 2021-10-28 (×14): qty 1

## 2021-10-28 MED ORDER — SODIUM CHLORIDE 0.9 % IV BOLUS
1000.0000 mL | Freq: Once | INTRAVENOUS | Status: AC
  Administered 2021-10-28: 1000 mL via INTRAVENOUS

## 2021-10-28 MED ORDER — FENTANYL CITRATE (PF) 100 MCG/2ML IJ SOLN
INTRAMUSCULAR | Status: AC
  Filled 2021-10-28: qty 2

## 2021-10-28 MED ORDER — TRANEXAMIC ACID-NACL 1000-0.7 MG/100ML-% IV SOLN
1000.0000 mg | Freq: Once | INTRAVENOUS | Status: AC
  Administered 2021-10-28: 1000 mg via INTRAVENOUS
  Filled 2021-10-28: qty 100

## 2021-10-28 MED ORDER — ONDANSETRON HCL 4 MG/2ML IJ SOLN
INTRAMUSCULAR | Status: DC | PRN
  Administered 2021-10-28: 4 mg via INTRAVENOUS

## 2021-10-28 MED ORDER — METOCLOPRAMIDE HCL 5 MG/ML IJ SOLN
5.0000 mg | Freq: Three times a day (TID) | INTRAMUSCULAR | Status: DC | PRN
  Administered 2021-10-29: 10 mg via INTRAVENOUS
  Filled 2021-10-28: qty 2

## 2021-10-28 MED ORDER — ROCURONIUM BROMIDE 10 MG/ML (PF) SYRINGE
PREFILLED_SYRINGE | INTRAVENOUS | Status: DC | PRN
  Administered 2021-10-28: 20 mg via INTRAVENOUS
  Administered 2021-10-28: 40 mg via INTRAVENOUS

## 2021-10-28 MED ORDER — EPHEDRINE 5 MG/ML INJ
INTRAVENOUS | Status: AC
  Filled 2021-10-28: qty 5

## 2021-10-28 MED ORDER — TRAMADOL HCL 50 MG PO TABS
50.0000 mg | ORAL_TABLET | Freq: Four times a day (QID) | ORAL | Status: DC | PRN
  Administered 2021-10-28: 50 mg via ORAL
  Administered 2021-10-29: 100 mg via ORAL
  Administered 2021-10-29: 50 mg via ORAL
  Administered 2021-10-30 – 2021-11-07 (×15): 100 mg via ORAL
  Administered 2021-11-08 – 2021-11-10 (×4): 50 mg via ORAL
  Filled 2021-10-28: qty 2
  Filled 2021-10-28 (×2): qty 1
  Filled 2021-10-28 (×8): qty 2
  Filled 2021-10-28: qty 1
  Filled 2021-10-28: qty 2
  Filled 2021-10-28: qty 1
  Filled 2021-10-28 (×4): qty 2
  Filled 2021-10-28: qty 1
  Filled 2021-10-28 (×3): qty 2
  Filled 2021-10-28: qty 1
  Filled 2021-10-28: qty 2
  Filled 2021-10-28: qty 1

## 2021-10-28 MED ORDER — AMISULPRIDE (ANTIEMETIC) 5 MG/2ML IV SOLN: 10.0000 mg | Freq: Once | INTRAVENOUS | Status: DC | PRN

## 2021-10-28 MED ORDER — DOCUSATE SODIUM 100 MG PO CAPS
100.0000 mg | ORAL_CAPSULE | Freq: Two times a day (BID) | ORAL | Status: DC
  Administered 2021-10-28 – 2021-11-02 (×10): 100 mg via ORAL
  Filled 2021-10-28 (×10): qty 1

## 2021-10-28 MED ORDER — SUGAMMADEX SODIUM 200 MG/2ML IV SOLN
INTRAVENOUS | Status: DC | PRN
  Administered 2021-10-28: 200 mg via INTRAVENOUS

## 2021-10-28 MED ORDER — LIDOCAINE 2% (20 MG/ML) 5 ML SYRINGE
INTRAMUSCULAR | Status: AC
  Filled 2021-10-28: qty 5

## 2021-10-28 MED ORDER — POLYETHYLENE GLYCOL 3350 17 G PO PACK: 17.0000 g | PACK | Freq: Every day | ORAL | Status: DC | PRN

## 2021-10-28 MED ORDER — FENTANYL CITRATE (PF) 100 MCG/2ML IJ SOLN
INTRAMUSCULAR | Status: AC
  Administered 2021-10-28: 50 ug via INTRAVENOUS
  Filled 2021-10-28: qty 2

## 2021-10-28 MED ORDER — PROMETHAZINE HCL 25 MG/ML IJ SOLN: 6.2500 mg | INTRAMUSCULAR | Status: DC | PRN

## 2021-10-28 MED ORDER — PROPOFOL 10 MG/ML IV BOLUS
INTRAVENOUS | Status: AC
  Filled 2021-10-28: qty 20

## 2021-10-28 MED ORDER — HYDROMORPHONE HCL 1 MG/ML IJ SOLN
0.5000 mg | Freq: Once | INTRAMUSCULAR | Status: AC
  Administered 2021-10-28: 0.5 mg via INTRAVENOUS
  Filled 2021-10-28: qty 1

## 2021-10-28 MED ORDER — DEXAMETHASONE SODIUM PHOSPHATE 10 MG/ML IJ SOLN
INTRAMUSCULAR | Status: DC | PRN
Start: 2021-10-28 — End: 2021-10-28
  Administered 2021-10-28: 10 mg via INTRAVENOUS

## 2021-10-28 MED ORDER — HYDROMORPHONE HCL 1 MG/ML IJ SOLN
1.0000 mg | INTRAMUSCULAR | Status: AC | PRN
  Administered 2021-10-28 – 2021-10-29 (×4): 1 mg via INTRAVENOUS
  Filled 2021-10-28 (×4): qty 1

## 2021-10-28 MED ORDER — ORAL CARE MOUTH RINSE: 15.0000 mL | Freq: Once | OROMUCOSAL | Status: AC

## 2021-10-28 MED ORDER — ROCURONIUM BROMIDE 10 MG/ML (PF) SYRINGE
PREFILLED_SYRINGE | INTRAVENOUS | Status: AC
  Filled 2021-10-28: qty 10

## 2021-10-28 MED ORDER — ONDANSETRON HCL 4 MG/2ML IJ SOLN
4.0000 mg | Freq: Four times a day (QID) | INTRAMUSCULAR | Status: DC | PRN
  Filled 2021-10-28: qty 2

## 2021-10-28 MED ORDER — CEFAZOLIN SODIUM-DEXTROSE 2-4 GM/100ML-% IV SOLN
2.0000 g | Freq: Three times a day (TID) | INTRAVENOUS | Status: AC
  Administered 2021-10-28 – 2021-10-29 (×3): 2 g via INTRAVENOUS
  Filled 2021-10-28 (×3): qty 100

## 2021-10-28 MED ORDER — ACETAMINOPHEN 500 MG PO TABS: 1000.0000 mg | ORAL_TABLET | Freq: Once | ORAL | Status: DC

## 2021-10-28 MED ORDER — 0.9 % SODIUM CHLORIDE (POUR BTL) OPTIME
TOPICAL | Status: DC | PRN
  Administered 2021-10-28: 1000 mL

## 2021-10-28 MED ORDER — PANTOPRAZOLE SODIUM 40 MG PO TBEC
40.0000 mg | DELAYED_RELEASE_TABLET | Freq: Every day | ORAL | Status: DC
  Administered 2021-10-28: 40 mg via ORAL
  Filled 2021-10-28 (×2): qty 1

## 2021-10-28 MED ORDER — PHENYLEPHRINE HCL-NACL 20-0.9 MG/250ML-% IV SOLN
INTRAVENOUS | Status: DC | PRN
  Administered 2021-10-28: 25 ug/min via INTRAVENOUS

## 2021-10-28 MED ORDER — ONDANSETRON HCL 4 MG/2ML IJ SOLN
INTRAMUSCULAR | Status: AC
  Filled 2021-10-28: qty 2

## 2021-10-28 MED ORDER — AMLODIPINE BESYLATE 5 MG PO TABS
5.0000 mg | ORAL_TABLET | Freq: Every day | ORAL | Status: DC
  Administered 2021-10-28 – 2021-11-10 (×14): 5 mg via ORAL
  Filled 2021-10-28 (×14): qty 1

## 2021-10-28 MED ORDER — ONDANSETRON HCL 4 MG/2ML IJ SOLN
4.0000 mg | Freq: Four times a day (QID) | INTRAMUSCULAR | Status: DC | PRN
  Administered 2021-10-29: 4 mg via INTRAVENOUS
  Filled 2021-10-28: qty 2

## 2021-10-28 MED ORDER — DEXAMETHASONE SODIUM PHOSPHATE 10 MG/ML IJ SOLN
INTRAMUSCULAR | Status: AC
  Filled 2021-10-28: qty 1

## 2021-10-28 MED ORDER — LACTATED RINGERS IV SOLN: INTRAVENOUS | Status: DC

## 2021-10-28 MED ORDER — CEFAZOLIN SODIUM-DEXTROSE 2-4 GM/100ML-% IV SOLN
2.0000 g | INTRAVENOUS | Status: AC
  Administered 2021-10-28: 2 g via INTRAVENOUS
  Filled 2021-10-28: qty 100

## 2021-10-28 MED ORDER — ENOXAPARIN SODIUM 40 MG/0.4ML IJ SOSY
40.0000 mg | PREFILLED_SYRINGE | INTRAMUSCULAR | Status: DC
  Administered 2021-10-29 – 2021-11-02 (×5): 40 mg via SUBCUTANEOUS
  Filled 2021-10-28 (×5): qty 0.4

## 2021-10-28 MED ORDER — FENTANYL CITRATE (PF) 100 MCG/2ML IJ SOLN
25.0000 ug | INTRAMUSCULAR | Status: DC | PRN
  Administered 2021-10-28: 25 ug via INTRAVENOUS

## 2021-10-28 MED ORDER — CHLORHEXIDINE GLUCONATE 0.12 % MT SOLN
15.0000 mL | Freq: Once | OROMUCOSAL | Status: AC
  Administered 2021-10-28: 15 mL via OROMUCOSAL
  Filled 2021-10-28: qty 15

## 2021-10-28 MED ORDER — PROPOFOL 10 MG/ML IV BOLUS
INTRAVENOUS | Status: DC | PRN
  Administered 2021-10-28: 100 mg via INTRAVENOUS

## 2021-10-28 MED ORDER — FENTANYL CITRATE (PF) 250 MCG/5ML IJ SOLN
INTRAMUSCULAR | Status: AC
  Filled 2021-10-28: qty 5

## 2021-10-28 MED ORDER — METOCLOPRAMIDE HCL 5 MG PO TABS
5.0000 mg | ORAL_TABLET | Freq: Three times a day (TID) | ORAL | Status: DC | PRN
  Administered 2021-10-30: 5 mg via ORAL
  Administered 2021-10-30: 10 mg via ORAL
  Filled 2021-10-28 (×2): qty 2

## 2021-10-28 SURGICAL SUPPLY — 71 items
BAG COUNTER SPONGE SURGICOUNT (BAG) ×2 IMPLANT
BIT DRILL INTERTAN LAG SCREW (BIT) ×1 IMPLANT
BIT DRILL LONG 4.0 (BIT) IMPLANT
BNDG ELASTIC 4X5.8 VLCR STR LF (GAUZE/BANDAGES/DRESSINGS) ×1 IMPLANT
BNDG ELASTIC 6X5.8 VLCR STR LF (GAUZE/BANDAGES/DRESSINGS) ×1 IMPLANT
BRUSH SCRUB EZ PLAIN DRY (MISCELLANEOUS) ×4 IMPLANT
CHLORAPREP W/TINT 26 (MISCELLANEOUS) ×3 IMPLANT
COVER SURGICAL LIGHT HANDLE (MISCELLANEOUS) ×4 IMPLANT
CUFF TOURN SGL QUICK 18X4 (TOURNIQUET CUFF) IMPLANT
CUFF TOURN SGL QUICK 24 (TOURNIQUET CUFF)
CUFF TOURN SGL QUICK 34 (TOURNIQUET CUFF)
CUFF TRNQT CYL 24X4X16.5-23 (TOURNIQUET CUFF) IMPLANT
CUFF TRNQT CYL 34X4.125X (TOURNIQUET CUFF) IMPLANT
DRAPE C-ARM 35X43 STRL (DRAPES) ×2 IMPLANT
DRAPE C-ARM 42X72 X-RAY (DRAPES) IMPLANT
DRAPE C-ARMOR (DRAPES) ×2 IMPLANT
DRAPE IMP U-DRAPE 54X76 (DRAPES) ×4 IMPLANT
DRAPE INCISE IOBAN 66X45 STRL (DRAPES) ×2 IMPLANT
DRAPE STERI IOBAN 125X83 (DRAPES) ×2 IMPLANT
DRAPE SURG 17X23 STRL (DRAPES) ×4 IMPLANT
DRAPE U-SHAPE 47X51 STRL (DRAPES) ×2 IMPLANT
DRESSING MEPILEX FLEX 4X4 (GAUZE/BANDAGES/DRESSINGS) IMPLANT
DRILL BIT LONG 4.0 (BIT) ×2
DRSG MEPILEX BORDER 4X8 (GAUZE/BANDAGES/DRESSINGS) ×1 IMPLANT
DRSG MEPILEX FLEX 4X4 (GAUZE/BANDAGES/DRESSINGS) ×10
ELECT REM PT RETURN 9FT ADLT (ELECTROSURGICAL) ×2
ELECTRODE REM PT RTRN 9FT ADLT (ELECTROSURGICAL) ×1 IMPLANT
GLOVE SURG ENC MOIS LTX SZ6.5 (GLOVE) ×6 IMPLANT
GLOVE SURG ENC MOIS LTX SZ7.5 (GLOVE) ×8 IMPLANT
GLOVE SURG UNDER POLY LF SZ6.5 (GLOVE) ×2 IMPLANT
GLOVE SURG UNDER POLY LF SZ7.5 (GLOVE) ×2 IMPLANT
GOWN STRL REUS W/ TWL LRG LVL3 (GOWN DISPOSABLE) ×2 IMPLANT
GOWN STRL REUS W/TWL LRG LVL3 (GOWN DISPOSABLE) ×4
GUIDE PIN 3.2X343 (PIN) ×2
GUIDE PIN 3.2X343MM (PIN) ×4
KIT BASIN OR (CUSTOM PROCEDURE TRAY) ×2 IMPLANT
KIT TURNOVER KIT B (KITS) ×2 IMPLANT
MANIFOLD NEPTUNE II (INSTRUMENTS) ×2 IMPLANT
NAIL EXTRACTOR DISP (INSTRUMENTS) ×1 IMPLANT
NAIL INTERTAN 10X18 130D 10S (Nail) ×1 IMPLANT
NS IRRIG 1000ML POUR BTL (IV SOLUTION) ×2 IMPLANT
PACK GENERAL/GYN (CUSTOM PROCEDURE TRAY) ×2 IMPLANT
PACK ORTHO EXTREMITY (CUSTOM PROCEDURE TRAY) ×2 IMPLANT
PAD ARMBOARD 7.5X6 YLW CONV (MISCELLANEOUS) ×4 IMPLANT
PAD CAST 4YDX4 CTTN HI CHSV (CAST SUPPLIES) IMPLANT
PADDING CAST COTTON 4X4 STRL (CAST SUPPLIES) ×2
PADDING CAST COTTON 6X4 STRL (CAST SUPPLIES) ×1 IMPLANT
PIN GUIDE 3.2X343MM (PIN) IMPLANT
SCREW LAG COMPR KIT 105/100 (Screw) ×1 IMPLANT
SCREW TRIGEN LOW PROF 5.0X37.5 (Screw) ×1 IMPLANT
SPONGE T-LAP 18X18 ~~LOC~~+RFID (SPONGE) ×2 IMPLANT
STAPLER VISISTAT 35W (STAPLE) IMPLANT
STOCKINETTE IMPERVIOUS LG (DRAPES) ×2 IMPLANT
SUCTION FRAZIER HANDLE 10FR (MISCELLANEOUS)
SUCTION TUBE FRAZIER 10FR DISP (MISCELLANEOUS) IMPLANT
SUT ETHILON 3 0 PS 1 (SUTURE) IMPLANT
SUT MNCRL AB 3-0 PS2 18 (SUTURE) ×2 IMPLANT
SUT MNCRL AB 3-0 PS2 27 (SUTURE) ×2 IMPLANT
SUT MON AB 2-0 CT1 36 (SUTURE) ×2 IMPLANT
SUT PDS AB 2-0 CT1 27 (SUTURE) IMPLANT
SUT VIC AB 0 CT1 27 (SUTURE)
SUT VIC AB 0 CT1 27XBRD ANBCTR (SUTURE) IMPLANT
SUT VIC AB 2-0 CT1 (SUTURE) ×1 IMPLANT
SUT VIC AB 2-0 CT1 27 (SUTURE) ×4
SUT VIC AB 2-0 CT1 TAPERPNT 27 (SUTURE) ×2 IMPLANT
SYR CONTROL 10ML LL (SYRINGE) IMPLANT
TOWEL GREEN STERILE (TOWEL DISPOSABLE) ×4 IMPLANT
TOWEL GREEN STERILE FF (TOWEL DISPOSABLE) ×4 IMPLANT
TUBE CONNECTING 12X1/4 (SUCTIONS) ×2 IMPLANT
UNDERPAD 30X36 HEAVY ABSORB (UNDERPADS AND DIAPERS) ×2 IMPLANT
YANKAUER SUCT BULB TIP NO VENT (SUCTIONS) ×2 IMPLANT

## 2021-10-28 NOTE — Anesthesia Preprocedure Evaluation (Addendum)
Anesthesia Evaluation  Patient identified by MRN, date of birth, ID band Patient awake    Reviewed: Allergy & Precautions, NPO status , Patient's Chart, lab work & pertinent test results  History of Anesthesia Complications Negative for: history of anesthetic complications  Airway Mallampati: II  TM Distance: >3 FB Neck ROM: Full    Dental no notable dental hx. (+) Dental Advisory Given   Pulmonary neg pulmonary ROS,    Pulmonary exam normal        Cardiovascular hypertension, Pt. on medications Normal cardiovascular exam     Neuro/Psych negative neurological ROS  negative psych ROS   GI/Hepatic Neg liver ROS,  Pancreatic neoplasm    Endo/Other  negative endocrine ROS  Renal/GU negative Renal ROS     Musculoskeletal  (+) Arthritis ,   Abdominal   Peds  Hematology  (+) Blood dyscrasia, , Lymphoma   Anesthesia Other Findings    Reproductive/Obstetrics                           Anesthesia Physical  Anesthesia Plan  ASA: 3  Anesthesia Plan: General   Post-op Pain Management:    Induction: Intravenous  PONV Risk Score and Plan: 3 and Treatment may vary due to age or medical condition, Ondansetron and Dexamethasone  Airway Management Planned: Oral ETT  Additional Equipment: None  Intra-op Plan:   Post-operative Plan: Extubation in OR  Informed Consent: I have reviewed the patients History and Physical, chart, labs and discussed the procedure including the risks, benefits and alternatives for the proposed anesthesia with the patient or authorized representative who has indicated his/her understanding and acceptance.     Dental advisory given  Plan Discussed with: CRNA and Anesthesiologist  Anesthesia Plan Comments:       Anesthesia Quick Evaluation

## 2021-10-28 NOTE — Op Note (Signed)
Orthopaedic Surgery Operative Note (CSN: [id number] ) Date of Surgery: 10/28/2021  Admit Date: 10/28/2021   Diagnoses: Pre-Op Diagnoses: Right periprosthetic intertrochanteric femur fracture  Post-Op Diagnosis: Same  Procedures: CPT 27245-Cephalomedullary nailing of right intertrochanteric femur fracture CPT 20680-Removal of hardware right femur  Surgeons : Primary: Shona Needles, MD  Assistant: Patrecia Pace, PA-C  Location: OR 3   Anesthesia:General   Antibiotics: Ancef 2g preop   Tourniquet time: None used    Estimated Blood ZDGL:87 mL  Complications:None  Specimens:None  Implants: Implant Name Type Inv. Item Serial No. Manufacturer Lot No. LRB No. Used Action  NAIL INTERTAN 10X18 130D 10S - [id number] Nail NAIL INTERTAN 10X18 130D 10S  [employer] [id number] Right 1 Implanted  SCREW LAG COMPR KIT 105/100 - [id number] Screw SCREW LAG COMPR KIT 105/100  [employer] [id number] Right 1 Implanted  SCREW TRIGEN LOW PROF 5.0X37.5 - [id number] Screw SCREW TRIGEN LOW PROF 5.0X37.5  [employer] [id number] Right 1 Implanted     Indications for Surgery: 81 year old male who sustained a right intertrochanteric femur fracture above a retrograde intramedullary nail.  Due to the unstable nature of his injury I recommend proceeding with cephalomedullary nailing of his right hip with removal of previous intramedullary nail.  Risk and benefits were discussed with the patient and his daughter.  Risks included but not limited to bleeding, infection, malunion, nonunion, hardware failure, hardware irritation, nerve or blood vessel injury, DVT, even the possibility anesthetic complications.  He agreed to proceed with surgery and consent was obtained.  Operative Findings: 1.  Removal of previous retrograde intramedullary nail without significant difficulty. 2.  Cephalomedullary nail of right intertrochanteric femur fracture using [employer]  InterTAN 10 x 180 mm nail with 105 mm lag screw/100 mm compression screw.  Procedure: The patient was identified in the preoperative holding area. Consent was confirmed with the patient and their family and all questions were answered. The operative extremity was marked after confirmation with the patient. he was then brought back to the operating room by our anesthesia colleagues.  He was carefully transferred over to a radiolucent flat top table.  He was placed under general anesthetic.  A bump was placed under his operative hip.  His right lower extremity was then prepped and draped in usual sterile fashion.  A timeout was performed to verify the patient, the procedure, and the extremity.  Preoperative antibiotics were dosed.  Fluoroscopic imaging showed the unstable nature of his fracture.  I made a small medial parapatellar incision and open up the knee joint.  Here I visualized the end of the nail which had an end cap.  I was then able to successfully remove the end And then threaded in the nail extraction bolt.  I tightened this until it was significantly tight.  I then made a proximal incision over his previous interlocking screws.  I then carried this down to the bone to where I can visualize the screw head.  I then placed the screwdriver to try remove it the most proximal screw strips I had to use the broken screw extraction set to remove this.  The other screw was removed without difficulty.  I was then able to back slapped the nail out of the femur.  The hip and knee were then flexed over a triangle.  Traction was applied by my assistant.  A small incision proximal to the greater trochanter was made and carried down to the trochanter itself.  I  directed a threaded guidewire into the tip of the greater trochanter into the proximal metaphysis.  I then used an entry reamer to enter the medullary canal.  I then passed a 10 x 180 mm InterTAN down the center of the canal and seated it until it was  appropriately positioned.  I then directed a threaded guidewire up into the head/neck segment.  I confirmed adequate tip apex distance and measured the length.  I then drilled the path for the compression screw and placed an antirotation bar.  I then drilled the path for the lag screw.  The lag screw was placed and I compressed approximately 5 mm with the compression screw.  Excellent fixation was obtained and I statically locked the nail proximally.  I then used the targeting arm to place a distal interlocking screw.  The targeting arm was then removed.  Final fluoroscopic imaging was obtained.  The incisions were copiously irrigated.  A layered closure of 0 Vicryl, 2-0 Vicryl and 3-0 Monocryl with Dermabond was used to close the skin.  Sterile dressings were applied.  The patient was then awoke from anesthesia and taken to the PACU in stable condition.  Post Op Plan/Instructions: The patient be weightbearing as tolerated to the right lower extremity.  He will receive postoperative Ancef.  He will receive Lovenox for DVT prophylaxis and likely discharge on a DOAC.  We will have him mobilize with physical and Occupational Therapy.  I was present and performed the entire surgery.  Patrecia Pace, PA-C did assist me throughout the case. An assistant was necessary given the difficulty in approach, maintenance of reduction and ability to instrument the fracture.   Katha Hamming, MD [facility]

## 2021-10-28 NOTE — OR Nursing (Signed)
A nail and two screws removed from right femur.

## 2021-10-28 NOTE — ED Notes (Signed)
Prior to leaving, pt's daughter Lattie Haw confirmed with this RN that her phone number is correct in pt's chart for updates.

## 2021-10-28 NOTE — Assessment & Plan Note (Addendum)
Blood pressure reasonably well controlled.  Continue amlodipine and lisinopril.

## 2021-10-28 NOTE — ED Triage Notes (Signed)
Pt bib EMS from home, tripped and fell over a shoe and landed on R hip, c/o R hip pain. R leg shortening noted upon arrival. Lives alone. Denies head or back pain, denies LOC.   279mcg fentanyl given by EMS.   EMS vitals: 186/94 hx HTN Pulse 76 99% room air

## 2021-10-28 NOTE — ED Notes (Signed)
Pt projectile vomiting. Giving zofran now. Notified MD

## 2021-10-28 NOTE — ED Notes (Signed)
Assumed pt care at this time

## 2021-10-28 NOTE — ED Notes (Signed)
Pt oxygen saturation fluctuating from 86% to 95%. Awake and denies SOB. Placed on 2L supplemental oxygen via nasal cannula. Oxygen saturation increased to 100%.

## 2021-10-28 NOTE — Plan of Care (Signed)

## 2021-10-28 NOTE — ED Provider Notes (Signed)
[facility] EMERGENCY DEPARTMENT Provider Note   CSN: [id number] Arrival date & time: 10/28/21  9735     History  Chief Complaint  Patient presents with   Fall   Hip Pain    TORE CARREKER is a 81 y.o. male.  HPI Patient is an 81 year old male with a history of lymphoma, left hip fracture, hypertension, who presents to the emergency department due to a fall.  Patient states that he was turning out the lights and his foot got stuck on the floor and he fell to the floor.  He states that he landed on the right hip and has exquisite pain to the right hip and proximal thigh.  Denies any numbness.  Denies any head trauma or LOC.  Denies any other regions of pain.  Denies anticoagulant use.  Patient was brought over via EMS and was given 200 mcg of fentanyl in route.  He notes moderate improvement in his pain.    Home Medications Prior to Admission medications   Medication Sig Start Date End Date Taking? Authorizing Provider  Aflibercept 2 MG/0.05ML SOLN Inject 0.5 mLs into the eye as directed. Take every 6 wks in eye doctor office.    [provider]  amLODipine (NORVASC) 5 MG tablet Take 5 mg by mouth daily. 06/02/20   [provider]  Ascorbic Acid (VITAMIN C) 1000 MG tablet Take 1,000 mg by mouth daily. Pt states he takes 6,000mg     [provider]  cholecalciferol (VITAMIN D) 1000 UNITS tablet Take 15,000 Units by mouth daily.     [provider]  ketoconazole (NIZORAL) 2 % cream APPLY TOPICALLY ONCE DAILY FOR 14 DAYS 12/09/19   [provider]  lisinopril (ZESTRIL) 20 MG tablet Take 20 mg by mouth daily.    [provider]  magnesium oxide (MAG-OX) 400 MG tablet Take 400 mg by mouth daily. "I take 500mg /day"    [provider]  Multiple Vitamin (MULTIVITAMIN) tablet Take 6 tablets by mouth daily.    [provider]  multivitamin-lutein (OCUVITE-LUTEIN) CAPS capsule Take 1 capsule by mouth daily.     [provider]  Probiotic Product (PROBIOTIC FORMULA PO) Take 1 tablet by mouth daily.    [provider]  rosuvastatin (CRESTOR) 5 MG tablet Take 5 mg by mouth once a week. 03/11/20   [provider]  tamsulosin (FLOMAX) 0.4 MG CAPS capsule Take 0.4 mg by mouth daily. 10/18/19   [provider]  Turmeric (QC TUMERIC COMPLEX PO) Take 1 tablet by mouth daily.    [provider]  Zinc 30 MG TABS Take 1 tablet by mouth every morning. Pt states he takes 34mcg.    [provider]      Allergies    Morphine and related, Shellfish allergy, Iohexol, Other, and Sulfa antibiotics    Review of Systems   Review of Systems  All other systems reviewed and are negative. Ten systems reviewed and are negative for acute change, except as noted in the HPI.   Physical Exam Updated Vital Signs BP (!) 170/87    Pulse 85    Temp 97.7 F (36.5 C) (Oral)    Resp 14    Ht 5\' 9"  (1.753 m)    Wt 79.4 kg    SpO2 94%    BMI 25.84 kg/m  Physical Exam Vitals and nursing note reviewed.  Constitutional:      General: He is not in acute distress.  Appearance: Normal appearance. He is not ill-appearing, toxic-appearing or diaphoretic.  HENT:     Head: Normocephalic and atraumatic.     Comments: No visible signs of trauma.    Right Ear: External ear normal.     Left Ear: External ear normal.     Nose: Nose normal.     Mouth/Throat:     Mouth: Mucous membranes are moist.     Pharynx: Oropharynx is clear. No oropharyngeal exudate or posterior oropharyngeal erythema.  Eyes:     General: No scleral icterus.       Right eye: No discharge.        Left eye: No discharge.     Extraocular Movements: Extraocular movements intact.     Conjunctiva/sclera: Conjunctivae normal.  Cardiovascular:     Rate and Rhythm: Normal rate and regular rhythm.     Pulses: Normal pulses.     Heart sounds: Normal heart sounds. No murmur heard.   No friction rub. No gallop.   Pulmonary:     Effort: Pulmonary effort is normal. No respiratory distress.     Breath sounds: Normal breath sounds. No stridor. No wheezing, rhonchi or rales.     Comments: No chest wall tenderness. Chest:     Chest wall: No tenderness.  Abdominal:     General: Abdomen is flat.     Palpations: Abdomen is soft.     Tenderness: There is no abdominal tenderness.     Comments: Abdomen is soft and nontender.  Musculoskeletal:        General: Tenderness, deformity and signs of injury present. Normal range of motion.     Cervical back: Normal range of motion and neck supple. No tenderness.     Comments: Right leg is shortened and externally rotated.  Distal sensation intact.  2+ pedal pulses.  Wiggling of the toes the right foot without difficulty.  Skin:    General: Skin is warm and dry.  Neurological:     General: No focal deficit present.     Mental Status: He is alert and oriented to person, place, and time.  Psychiatric:        Mood and Affect: Mood normal.        Behavior: Behavior normal.   ED Results / Procedures / Treatments   Labs (all labs ordered are listed, but only abnormal results are displayed) Labs Reviewed  COMPREHENSIVE METABOLIC PANEL - Abnormal; Notable for the following components:      Result Value   Glucose, Bld 143 (*)    Total Bilirubin 1.4 (*)    All other components within normal limits  CBC WITH DIFFERENTIAL/PLATELET - Abnormal; Notable for the following components:   Neutro Abs 8.4 (*)    All other components within normal limits  RESP PANEL BY RT-PCR (FLU A&B, COVID) [id number]   EKG None  Radiology DG Chest 1 View  Result Date: 10/28/2021 CLINICAL DATA:  Trauma EXAM: CHEST  1 VIEW COMPARISON:  CT chest dated 06/06/2012 FINDINGS: Mild left basilar atelectasis. Right lung is clear No pleural effusion or pneumothorax. The heart is normal in size. Right chest power port terminates in the upper right atrium. Mild deformity of the left humeral head with  cancellous screw in the proximal humeral shaft. IMPRESSION: No evidence of acute cardiopulmonary disease. Electronically Signed   By: Julian Hy M.D.   On: 10/28/2021 02:25   DG Pelvis 1-2 Views  Result Date: 10/28/2021 CLINICAL DATA:  Trauma EXAM: PELVIS - 1-2 VIEW COMPARISON:  None. FINDINGS: Intertrochanteric right hip fracture with varus angulation. Status post ORIF of the left hip.  Visualized bony pelvis is intact. Prior ORIF of the right proximal femur, incompletely visualized. IMPRESSION: Intertrochanteric right hip fracture. Electronically Signed   By: Julian Hy M.D.   On: 10/28/2021 02:26   DG Femur Min 2 Views Right  Result Date: 10/28/2021 CLINICAL DATA:  Fall, leg shortened and externally rotated EXAM: RIGHT FEMUR 2 VIEWS COMPARISON:  None. FINDINGS: Intertrochanteric right hip fracture with foreshortening and varus angulation. Old right proximal femoral shaft fracture deformity, status post ORIF with IM nail and 2 proximal screws. Degenerative changes of the right knee. IMPRESSION: Intertrochanteric right hip fracture. Electronically Signed   By: Julian Hy M.D.   On: 10/28/2021 02:27    Procedures Procedures   Medications Ordered in ED Medications  sodium chloride 0.9 % bolus 1,000 mL (1,000 mLs Intravenous New Bag/Given 10/28/21 0144)  HYDROmorphone (DILAUDID) injection 0.5 mg (0.5 mg Intravenous Given 10/28/21 0235)   ED Course/ Medical Decision Making/ A&P                           Medical Decision Making Amount and/or Complexity of Data Reviewed Labs: ordered. Radiology: ordered.  Risk Prescription drug management. Decision regarding hospitalization.  Pt is a 81 y.o. male who presents to the emergency department due to a mechanical fall that occurred prior to arrival.  Labs: CBC with neutrophils of 8.4. CMP with glucose of 143 and a total bilirubin of 1.4. Respiratory panel is negative.  Imaging: Chest x-ray shows no evidence of acute  cardiopulmonary disease. X-ray of the pelvis and right femur shows intertrochanteric right hip fracture.  I, Rayna Sexton, PA-C, personally reviewed and evaluated these images and lab results as part of my medical decision-making.  Patient discussed with Dr. Ninfa Linden who is on-call for the patient's orthopedist, Dr. Lorin Mercy.  Recommends medicine admission and to keep patient n.p.o.  They will evaluate the patient.  This was discussed with the patient as well as his daughter at bedside who are amenable.  Patient's pain was treated with fentanyl as well as Dilaudid.  Started on 1 L of maintenance fluids.  We will discuss with the medicine team for admission at this time.  Note: Portions of this report may have been transcribed using voice recognition software. Every effort was made to ensure accuracy; however, inadvertent computerized transcription errors may be present.   Final Clinical Impression(s) / ED Diagnoses Final diagnoses:  Closed displaced intertrochanteric fracture of right femur, initial encounter [facility])   Rx / [place] Orders ED Discharge Orders     None         Rayna Sexton, PA-C 10/28/21 0308    Veryl Speak, MD 10/28/21 [phone]

## 2021-10-28 NOTE — Interval H&P Note (Signed)
History and Physical Interval Note:  10/28/2021 1:42 PM  ANES RIGEL  has presented today for surgery, with the diagnosis of Right intertrochanteric femur fracture.  The various methods of treatment have been discussed with the patient and family. After consideration of risks, benefits and other options for treatment, the patient has consented to  Procedure(s): INTRAMEDULLARY (IM) NAIL INTERTROCHANTRIC (Right) HARDWARE REMOVAL (Right) as a surgical intervention.  [name] history has been reviewed, patient examined, no change in status, stable for surgery.  I have reviewed the patient's chart and labs.  Questions were answered to the patient's satisfaction.     Lennette Bihari P Delorice Bannister

## 2021-10-28 NOTE — H&P (View-Only) (Signed)
Reason for Consult:Right periprosthetic hip fx Referring Physician: Eulogio Bear Time called: 0730 Time at bedside: 0850   [name] [name] is an 81 y.o. male.  HPI: Rush Landmark got up from his chair last night to turn off the lights and tripped and fell. He had immediate pain in his right hip and could not get up. He was brought to the ED where x-rays showed a right hip fx and orthopedic surgery was consulted. He lives alone and generally ambulates with a cane when he's out of the house. He feels his balance is off of late.  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   Right Ear Superior   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   Right Sideburn   Other malignant lymphomas, unspecified site, extranodal and solid organ sites    Squamous cell carcinoma of skin 09/26/2001   Left forehead - CX3+excision   Squamous cell carcinoma of skin 03/11/2004   Post right scalp - CX3+5FU   Squamous cell carcinoma of skin 03/11/2004   Left forehead (MOHs)   Squamous cell carcinoma of skin 03/11/2004   Right sideburn - CX3+5FU+excision   Squamous cell carcinoma of skin 12/01/2004   Left temple - CX3+excision   Squamous cell carcinoma of skin 04/13/2006   Mid scalp, sup - tx p bx   Squamous cell carcinoma of skin 04/13/2006   Mid scalp, inf - tx p bx   Squamous cell carcinoma of skin 04/13/2006   Left temple - tx p bx   Squamous cell carcinoma of  skin 06/30/2010   Left low back - tx p bx   Squamous cell carcinoma of skin 06/23/2013   Front scalp - CX3+5FU   Squamous cell carcinoma of skin 01/14/2018   Left scalp - CX3+5FU    Past Surgical History:  Procedure Laterality Date   FEMUR IM NAIL Left 10/06/2019   Procedure: INTRAMEDULLARY (IM) RETROGRADE FEMORAL NAIL REMOVAL ( [employer]);  Surgeon: Marybelle Killings, MD;  Location: [place];  Service: Orthopedics;  Laterality: Left;   FEMUR SURGERY Bilateral    INTRAMEDULLARY (IM) NAIL INTERTROCHANTERIC Left 10/06/2019   Procedure: BIOMET AFFIXUS SHORT NAIL,  INTERTROCHANTRIC;  Surgeon: Marybelle Killings, MD;  Location: [place];  Service: Orthopedics;  Laterality: Left;   LEG SURGERY Left    SHOULDER SURGERY Left     History reviewed. No pertinent family history.  Social History:  reports that he has never smoked. He has never used smokeless tobacco. He reports that he does not drink alcohol and does not use drugs.  Allergies:  Allergies  Allergen Reactions   Morphine And Related Anxiety    HALLUCINATIONS   Shellfish Allergy Anxiety    HALLUCINATIONS   Iohexol      Desc: pt. broke out in red pimples 3 days after his ct scan w/contrast, ok with pre-meds  Other Other (See Comments)   Sulfa Antibiotics Hives    Medications: I have reviewed the patient's current medications.  Results for orders placed or performed during the hospital encounter of 10/28/21 (from the past 48 hour(s))  Resp Panel by RT-PCR (Flu A&B, Covid) Nasopharyngeal Swab     Status: None   Collection Time: 10/28/21  1:20 AM   Specimen: Nasopharyngeal Swab; Nasopharyngeal(NP) swabs in vial transport medium  Result Value Ref Range   SARS Coronavirus 2 by RT PCR NEGATIVE NEGATIVE    Comment: (NOTE) SARS-CoV-2 target nucleic acids are NOT DETECTED.  The SARS-CoV-2 RNA is generally detectable in upper respiratory specimens during the acute phase of infection. The lowest concentration of SARS-CoV-2 viral copies  this assay can detect is 138 copies/mL. A negative result does not preclude SARS-Cov-2 infection and should not be used as the sole basis for treatment or other patient management decisions. A negative result may occur with  improper specimen collection/handling, submission of specimen other than nasopharyngeal swab, presence of viral mutation(s) within the areas targeted by this assay, and inadequate number of viral copies(<138 copies/mL). A negative result must be combined with clinical observations, patient history, and epidemiological information. The expected result is Negative.  Fact Sheet for Patients:  EntrepreneurPulse.com.au  Fact Sheet for Healthcare Providers:  IncredibleEmployment.be  This test is no t yet approved or cleared by the [place] FDA and  has been authorized for detection and/or diagnosis of SARS-CoV-2 by FDA under an Emergency Use Authorization (EUA). This EUA will remain  in effect (meaning this test can be used) for the duration of the COVID-19 declaration under Section 564(b)(1) of the Act, 21 U.S.C.section 360bbb-3(b)(1), unless the authorization is terminated  or revoked sooner.       Influenza A by PCR NEGATIVE NEGATIVE   Influenza B by PCR NEGATIVE NEGATIVE    Comment: (NOTE) The Xpert Xpress SARS-CoV-2/FLU/RSV plus assay is intended as an aid in the diagnosis of influenza from Nasopharyngeal swab specimens and should not be used as a sole basis for treatment. Nasal washings and aspirates are unacceptable for Xpert Xpress SARS-CoV-2/FLU/RSV testing.  Fact Sheet for Patients: EntrepreneurPulse.com.au  Fact Sheet for Healthcare Providers: IncredibleEmployment.be  This test is not yet approved or cleared by the [place] FDA and has been authorized for detection and/or diagnosis of SARS-CoV-2 by FDA under an Emergency Use Authorization (EUA). This EUA will  remain in effect (meaning this test can be used) for the duration of the COVID-19 declaration under Section 564(b)(1) of the Act, 21 U.S.C. section 360bbb-3(b)(1), unless the authorization is terminated or revoked.  Performed at [facility], [place] [address], [place],  [address]   Comprehensive metabolic panel     Status: Abnormal   Collection Time: 10/28/21  1:40 AM  Result Value Ref Range   Sodium 138 135 - 145 mmol/L   Potassium 4.0 3.5 - 5.1 mmol/L   Chloride 104 98 - 111 mmol/L   CO2 24 22 - 32 mmol/L   Glucose, Bld 143 (H) 70 - 99 mg/dL    Comment: Glucose reference range applies only to samples taken after fasting for at least 8 hours.   BUN 20 8 - 23 mg/dL   Creatinine, Ser 1.07 0.61 - 1.24 mg/dL   Calcium 9.3 8.9 - 10.3 mg/dL   Total Protein 6.8 6.5 - 8.1 g/dL   Albumin 3.9 3.5 - 5.0 g/dL   AST 26 15 - 41 U/L   ALT 17 0 -  44 U/L   Alkaline Phosphatase 63 38 - 126 U/L   Total Bilirubin 1.4 (H) 0.3 - 1.2 mg/dL   GFR, Estimated >60 >60 mL/min    Comment: (NOTE) Calculated using the CKD-EPI Creatinine Equation (2021)    Anion gap 10 5 - 15    Comment: Performed at [facility] [address] [place], [place] [address]  CBC with Differential     Status: Abnormal   Collection Time: 10/28/21  1:40 AM  Result Value Ref Range   WBC 10.5 4.0 - 10.5 K/uL   RBC 4.79 4.22 - 5.81 MIL/uL   Hemoglobin 13.6 13.0 - 17.0 g/dL   HCT 42.6 39.0 - 52.0 %   MCV 88.9 80.0 - 100.0 fL   MCH 28.4 26.0 - 34.0 pg   MCHC 31.9 30.0 - 36.0 g/dL   RDW 14.3 11.5 - 15.5 %   Platelets 164 150 - 400 K/uL   nRBC 0.0 0.0 - 0.2 %   Neutrophils Relative % 81 %   Neutro Abs 8.4 (H) 1.7 - 7.7 K/uL   Lymphocytes Relative 14 %   Lymphs Abs 1.5 0.7 - 4.0 K/uL   Monocytes Relative 4 %   Monocytes Absolute 0.4 0.1 - 1.0 K/uL   Eosinophils Relative 1 %   Eosinophils Absolute 0.1 0.0 - 0.5 K/uL   Basophils Relative 0 %   Basophils Absolute 0.0 0.0 - 0.1 K/uL   Immature Granulocytes  0 %   Abs Immature Granulocytes 0.04 0.00 - 0.07 K/uL    Comment: Performed at [facility] [address], [place], [place] [address]    DG Chest 1 View  Result Date: 10/28/2021 CLINICAL DATA:  Trauma EXAM: CHEST  1 VIEW COMPARISON:  CT chest dated 06/06/2012 FINDINGS: Mild left basilar atelectasis. Right lung is clear No pleural effusion or pneumothorax. The heart is normal in size. Right chest power port terminates in the upper right atrium. Mild deformity of the left humeral head with cancellous screw in the proximal humeral shaft. IMPRESSION: No evidence of acute cardiopulmonary disease. Electronically Signed   By: Julian Hy M.D.   On: 10/28/2021 02:25   DG Pelvis 1-2 Views  Result Date: 10/28/2021 CLINICAL DATA:  Trauma EXAM: PELVIS - 1-2 VIEW COMPARISON:  None. FINDINGS: Intertrochanteric right hip fracture with varus angulation. Status post ORIF of the left hip.  Visualized bony pelvis is intact. Prior ORIF of the right proximal femur, incompletely visualized. IMPRESSION: Intertrochanteric right hip fracture. Electronically Signed   By: Julian Hy M.D.   On: 10/28/2021 02:26   DG Femur Min 2 Views Right  Result Date: 10/28/2021 CLINICAL DATA:  Fall, leg shortened and externally rotated EXAM: RIGHT FEMUR 2 VIEWS COMPARISON:  None. FINDINGS: Intertrochanteric right hip fracture with foreshortening and varus angulation. Old right proximal femoral shaft fracture deformity, status post ORIF with IM nail and 2 proximal screws. Degenerative changes of the right knee. IMPRESSION: Intertrochanteric right hip fracture. Electronically Signed   By: Julian Hy M.D.   On: 10/28/2021 02:27    Review of Systems  HENT:  Negative for ear discharge, ear pain, hearing loss and tinnitus.   Eyes:  Negative for photophobia and pain.  Respiratory:  Negative for cough and shortness of breath.   Cardiovascular:  Negative for chest pain.  Gastrointestinal:  Negative for abdominal  pain, nausea and vomiting.  Genitourinary:  Negative for dysuria, flank pain, frequency and urgency.  Musculoskeletal:  Positive for arthralgias (Right hip).  Negative for back pain, myalgias and neck pain.  Neurological:  Negative for dizziness and headaches.  Hematological:  Does not bruise/bleed easily.  Psychiatric/Behavioral:  The patient is not nervous/anxious.   Blood pressure (!) 154/80, pulse 83, temperature 97.7 F (36.5 C), temperature source Oral, resp. rate 11, height 5\' 9"  (1.753 m), weight 79.4 kg, SpO2 97 %. Physical Exam Constitutional:      General: He is not in acute distress.    Appearance: He is well-developed. He is not diaphoretic.  HENT:     Head: Normocephalic and atraumatic.  Eyes:     General: No scleral icterus.       Right eye: No discharge.        Left eye: No discharge.     Conjunctiva/sclera: Conjunctivae normal.  Cardiovascular:     Rate and Rhythm: Normal rate and regular rhythm.  Pulmonary:     Effort: Pulmonary effort is normal. No respiratory distress.  Musculoskeletal:     Cervical back: Normal range of motion.     Comments: RLE No traumatic wounds, ecchymosis, or rash  Mod TTP hip  No knee or ankle effusion  Knee stable to varus/ valgus and anterior/posterior stress  Sens DPN, SPN, TN intact  Motor EHL, ext, flex, evers 5/5  DP 1+, PT 0, No significant edema  Skin:    General: Skin is warm and dry.  Neurological:     Mental Status: He is alert.  Psychiatric:        Mood and Affect: Mood normal.        Behavior: Behavior normal.    Assessment/Plan: Right hip fx -- Plan hardware removal, IMN today with Dr. Doreatha Martin. Please keep NPO. Multiple medical problems including HTN and hx/o lymphoma and BCC -- per primary service    Lisette Abu, PA-C Orthopedic Surgery [phone] 10/28/2021, 9:01 AM

## 2021-10-28 NOTE — ED Notes (Signed)
Patient transported to X-ray 

## 2021-10-28 NOTE — Anesthesia Procedure Notes (Signed)
Procedure Name: Intubation Date/Time: 10/28/2021 2:59 PM Performed by: Dorann Lodge, CRNA Pre-anesthesia Checklist: Patient identified, Emergency Drugs available, Suction available and Patient being monitored Patient Re-evaluated:Patient Re-evaluated prior to induction Oxygen Delivery Method: Circle System Utilized Preoxygenation: Pre-oxygenation with 100% oxygen Induction Type: IV induction Ventilation: Mask ventilation without difficulty and Oral airway inserted - appropriate to patient size Laryngoscope Size: Mac and 4 Grade View: Grade I Tube type: Oral Number of attempts: 1 Airway Equipment and Method: Stylet and Oral airway Placement Confirmation: ETT inserted through vocal cords under direct vision, positive ETCO2 and breath sounds checked- equal and bilateral Secured at: 23 cm Tube secured with: Tape Dental Injury: Teeth and Oropharynx as per pre-operative assessment

## 2021-10-28 NOTE — H&P (Signed)
History and Physical    Patient: [name] [name] ONG:[id number] DOB: 05/01/1941 DOA: 10/28/2021 DOS: the patient was seen and examined on 10/28/2021 PCP: [name], MD  Patient coming from: Home  Chief Complaint:  Chief Complaint  Patient presents with   Fall   Hip Pain    HPI: [name] [name] is a 81 y.o. male with medical history significant of HTN history of lymphoma and [place] who presents by EMS after a fall at home.  He reports he was getting up out of his chair to go to bed and had turned out the light when he turned to walk to his room and his foot stayed planted on floor causing him to lose balance and fall. He landed on his right hip. Had instant pain and was not able to stand up. He managed to scoot across the floor to his phone and call his daughter. He denies any head trauma or LOC. He denies any chest pain/pressure, palpitations, cough, SOB, fever, abdominal pain, nausea or vomiting.  He took his regular medications in the morning but did not take his evening doses as he usually takes those when he goes to bed.   In the emergency room patient been hemodynamically stable.  He was found to have a right intertrochanter fracture.  ER provider discussed with orthopedic surgery who will see patient in the morning and plan for definitive repair of fracture.  Hospitalist service asked to admit for further management  Review of Systems: As mentioned in the history of present illness. All other systems reviewed and are negative. 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   Right Ear Superior   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   Right Sideburn   Other malignant lymphomas, unspecified site, extranodal and solid organ sites    Squamous cell carcinoma of skin 09/26/2001   Left forehead - CX3+excision   Squamous cell carcinoma of skin 03/11/2004   Post right scalp - CX3+5FU   Squamous cell carcinoma of skin 03/11/2004   Left forehead (MOHs)   Squamous cell carcinoma of skin 03/11/2004   Right sideburn - CX3+5FU+excision   Squamous cell carcinoma of skin 12/01/2004   Left temple - CX3+excision   Squamous cell carcinoma of skin 04/13/2006   Mid scalp, sup - tx p bx   Squamous cell carcinoma of skin 04/13/2006   Mid scalp, inf - tx p bx   Squamous cell carcinoma of skin 04/13/2006   Left temple - tx p bx   Squamous cell carcinoma of skin 06/30/2010   Left low back - tx p bx   Squamous cell carcinoma of skin 06/23/2013   Front scalp - CX3+5FU   Squamous cell carcinoma of skin 01/14/2018   Left scalp - CX3+5FU   Past Surgical History:  Procedure Laterality Date   FEMUR IM NAIL Left 10/06/2019  Procedure: INTRAMEDULLARY (IM) RETROGRADE FEMORAL NAIL REMOVAL ( [employer]);  Surgeon: Marybelle Killings, MD;  Location: [place];  Service: Orthopedics;  Laterality: Left;   FEMUR SURGERY Bilateral    INTRAMEDULLARY (IM) NAIL INTERTROCHANTERIC Left 10/06/2019   Procedure: BIOMET AFFIXUS SHORT NAIL,  INTERTROCHANTRIC;  Surgeon: Marybelle Killings, MD;  Location: [place];  Service: Orthopedics;  Laterality: Left;   LEG SURGERY Left    SHOULDER SURGERY Left    Social History:  reports that he has never smoked. He has never used smokeless tobacco. He reports that he does not drink alcohol and does not use drugs.  Allergies  Allergen Reactions   Morphine And Related Anxiety    HALLUCINATIONS   Shellfish Allergy Anxiety     HALLUCINATIONS   Iohexol      Desc: pt. broke out in red pimples 3 days after his ct scan w/contrast, ok with pre-meds    Other Other (See Comments)   Sulfa Antibiotics Hives    History reviewed. No pertinent family history.  Prior to Admission medications   Medication Sig Start Date End Date Taking? Authorizing Provider  amLODipine (NORVASC) 5 MG tablet Take 5 mg by mouth daily. 06/02/20  Yes [provider]  APPLE CIDER VINEGAR PO Take 1 capsule by mouth at bedtime.   Yes [provider]  Ascorbic Acid (VITAMIN C) 1000 MG tablet Take 1,000 mg by mouth daily. Pt states he takes 6,000mg    Yes [provider]  cholecalciferol (VITAMIN D) 1000 UNITS tablet Take 15,000 Units by mouth daily.    Yes [provider]  lisinopril (ZESTRIL) 20 MG tablet Take 20 mg by mouth daily.   Yes [provider]  magnesium oxide (MAG-OX) 400 MG tablet Take 400 mg by mouth daily. "I take 500mg /day"   Yes [provider]  Multiple Vitamin (MULTIVITAMIN) tablet Take 6 tablets by mouth daily.   Yes [provider]  multivitamin-lutein (OCUVITE-LUTEIN) CAPS capsule Take 1 capsule by mouth daily.   Yes [provider]  multivitamin-lutein (OCUVITE-LUTEIN) CAPS capsule Take 1 capsule by mouth daily.   Yes [provider]  Probiotic Product (PROBIOTIC FORMULA PO) Take 1 tablet by mouth daily.   Yes [provider]  Turmeric (QC TUMERIC COMPLEX PO) Take 1 tablet by mouth daily.   Yes [provider]  Zinc 30 MG TABS Take 1 tablet by mouth every morning. Pt states he takes 49mcg.   Yes [provider]  Aflibercept 2 MG/0.05ML SOLN Inject 0.5 mLs into the eye as directed. Take every 6 wks in eye doctor office.    [provider]  ketoconazole (NIZORAL) 2 % cream APPLY TOPICALLY ONCE DAILY FOR 14 DAYS Patient not taking: Reported on 10/28/2021 12/09/19   [provider]  rosuvastatin (CRESTOR) 5 MG  tablet Take 5 mg by mouth once a week. Patient not taking: Reported on 10/28/2021 03/11/20   [provider]  tamsulosin (FLOMAX) 0.4 MG CAPS capsule Take 0.4 mg by mouth daily. Patient not taking: Reported on 10/28/2021 10/18/19   [provider]    Physical Exam: Vitals:   10/28/21 0052 10/28/21 0100 10/28/21 0130 10/28/21 0245  BP:  (!) 163/106 (!) 172/95 (!) 170/87  Pulse:  68 73 85  Resp:  16 15 14   Temp:      TempSrc:      SpO2:  99% 97% 94%  Weight: 79.4 kg     Height: 5\' 9"  (1.753 m)  General: WDWN, Alert and oriented x3.  Eyes: EOMI, PERRL, conjunctivae normal. Sclera nonicteric HENT:  [place]/AT, external ears normal.  Nares patent without epistasis. Mucous membranes are moist. Posterior pharynx clear of any exudate Neck: Soft, normal range of motion, supple, no masses, no thyromegaly. Trachea midline Respiratory: clear to auscultation bilaterally, no wheezing, no crackles. Normal respiratory effort. No accessory muscle use.  Cardiovascular: Regular rate and rhythm, no murmurs / rubs / gallops. No extremity edema. 2+ pedal pulses. Abdomen: Soft, no tenderness, nondistended, no rebound or guarding.  No masses palpated. Bowel sounds normoactive Musculoskeletal: Right leg shortened and externally rotated. Right hip tender to palpation anteriorly and laterally. FROM of upper extremities. no cyanosis. Normal muscle tone.  Skin: Warm, dry, intact no rashes, lesions, ulcers. No induration Neurologic: CN 2-12 grossly intact.  Normal speech. Sensation intact to touch Psychiatric: Normal judgment and insight.  Normal mood.    Data Reviewed CBC is normal with a WBC of 10,500 hemoglobin 13.6 hematocrit 42.6 platelets 164,000 CMP is unremarkable except for bilirubin level of 1.4.  Sodium is 138 potassium 4.0 creatinine 1.07 BUN of 20  Assessment and Plan: * Closed right hip fracture ([place])- (present on admission) Mr. Wadding is admitted to Med-Surg floor with closed right  hip fracture.  Dilaudid for pain control ordered.  IVF hydration with LR Ortho consulted and will see pt in am and plan for definitive repair of fracture.  NPO  Essential hypertension- (present on admission) Continue lisinopril and norvasc. Monitor BP   Advance Care Planning: CODE STATUS: Full code  Consults: Orthopedic surgery  Family Communication: Diagnosis and plan discussed with patient and his daughter who is at bedside.  They verbalized understanding.  Questions answered.  Further recommendations to follow as clinical indicated  Author: Eben Burow, MD 10/28/2021 3:51 AM  For on call review [email or web address].

## 2021-10-28 NOTE — Transfer of Care (Signed)
Immediate Anesthesia Transfer of Care Note  Patient: [name] [name]  Procedure(s) Performed: INTRAMEDULLARY (IM) NAIL INTERTROCHANTRIC (Right) HARDWARE REMOVAL (Right)  Patient Location: PACU  Anesthesia Type:General  Level of Consciousness: awake and drowsy  Airway & Oxygen Therapy: Patient Spontanous Breathing  Post-op Assessment: Report given to RN and Post -op Vital signs reviewed and stable  Post vital signs: Reviewed and stable  Last Vitals:  Vitals Value Taken Time  BP 152/91 10/28/21 1647  Temp 36.8 C 10/28/21 1647  Pulse 93 10/28/21 1648  Resp 1 10/28/21 1648  SpO2 92 % 10/28/21 1648  Vitals shown include unvalidated device data.  Last Pain:  Vitals:   10/28/21 1239  TempSrc: Oral  PainSc: 9          Complications: No notable events documented.

## 2021-10-28 NOTE — Progress Notes (Signed)
Patient admitted after midnight, please see H&P.  Here with hip fracture.  Plan for surgery today?  Await ortho note and plan (Dr. Doreatha Martin on treatment team)  Eulogio Bear DO

## 2021-10-28 NOTE — Assessment & Plan Note (Addendum)
Closed right hip fracture. S/p repair on 2/10. Pain seems to be reasonably well controlled.   Orthopedics to transition from Lovenox to Eliquis at discharge.

## 2021-10-28 NOTE — Progress Notes (Signed)
Patient received from PACU via stretched.  Patient is alert and oriented x 4.  Right hip surgical dressing (mepilex) CDI.  RLE with good sensation, able to wiggle toes, capillary refill normal.  C/o pain 7/10, pain medicine given as ordered.  Assisted in position of comfort in bed.  Oriented to room and unit routine, call bell within reach. Needs addressed.  Daughters at bedside, updated with plan of care.

## 2021-10-28 NOTE — Consult Note (Signed)
Reason for Consult:Right periprosthetic hip fx Referring Physician: Eulogio Bear Time called: 0730 Time at bedside: 0850   [name] [name] is an 81 y.o. male.  HPI: Rush Landmark got up from his chair last night to turn off the lights and tripped and fell. He had immediate pain in his right hip and could not get up. He was brought to the ED where x-rays showed a right hip fx and orthopedic surgery was consulted. He lives alone and generally ambulates with a cane when he's out of the house. He feels his balance is off of late.  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   Right Ear Superior   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   Right Sideburn   Other malignant lymphomas, unspecified site, extranodal and solid organ sites    Squamous cell carcinoma of skin 09/26/2001   Left forehead - CX3+excision   Squamous cell carcinoma of skin 03/11/2004   Post right scalp - CX3+5FU   Squamous cell carcinoma of skin 03/11/2004   Left forehead (MOHs)   Squamous cell carcinoma of skin 03/11/2004   Right sideburn - CX3+5FU+excision   Squamous cell carcinoma of skin 12/01/2004   Left temple - CX3+excision   Squamous cell carcinoma of skin 04/13/2006   Mid scalp, sup - tx p bx   Squamous cell carcinoma of skin 04/13/2006   Mid scalp, inf - tx p bx   Squamous cell carcinoma of skin 04/13/2006   Left temple - tx p bx   Squamous cell carcinoma of  skin 06/30/2010   Left low back - tx p bx   Squamous cell carcinoma of skin 06/23/2013   Front scalp - CX3+5FU   Squamous cell carcinoma of skin 01/14/2018   Left scalp - CX3+5FU    Past Surgical History:  Procedure Laterality Date   FEMUR IM NAIL Left 10/06/2019   Procedure: INTRAMEDULLARY (IM) RETROGRADE FEMORAL NAIL REMOVAL ( [employer]);  Surgeon: Marybelle Killings, MD;  Location: [place];  Service: Orthopedics;  Laterality: Left;   FEMUR SURGERY Bilateral    INTRAMEDULLARY (IM) NAIL INTERTROCHANTERIC Left 10/06/2019   Procedure: BIOMET AFFIXUS SHORT NAIL,  INTERTROCHANTRIC;  Surgeon: Marybelle Killings, MD;  Location: [place];  Service: Orthopedics;  Laterality: Left;   LEG SURGERY Left    SHOULDER SURGERY Left     History reviewed. No pertinent family history.  Social History:  reports that he has never smoked. He has never used smokeless tobacco. He reports that he does not drink alcohol and does not use drugs.  Allergies:  Allergies  Allergen Reactions   Morphine And Related Anxiety    HALLUCINATIONS   Shellfish Allergy Anxiety    HALLUCINATIONS   Iohexol      Desc: pt. broke out in red pimples 3 days after his ct scan w/contrast, ok with pre-meds  Other Other (See Comments)   Sulfa Antibiotics Hives    Medications: I have reviewed the patient's current medications.  Results for orders placed or performed during the hospital encounter of 10/28/21 (from the past 48 hour(s))  Resp Panel by RT-PCR (Flu A&B, Covid) Nasopharyngeal Swab     Status: None   Collection Time: 10/28/21  1:20 AM   Specimen: Nasopharyngeal Swab; Nasopharyngeal(NP) swabs in vial transport medium  Result Value Ref Range   SARS Coronavirus 2 by RT PCR NEGATIVE NEGATIVE    Comment: (NOTE) SARS-CoV-2 target nucleic acids are NOT DETECTED.  The SARS-CoV-2 RNA is generally detectable in upper respiratory specimens during the acute phase of infection. The lowest concentration of SARS-CoV-2 viral copies  this assay can detect is 138 copies/mL. A negative result does not preclude SARS-Cov-2 infection and should not be used as the sole basis for treatment or other patient management decisions. A negative result may occur with  improper specimen collection/handling, submission of specimen other than nasopharyngeal swab, presence of viral mutation(s) within the areas targeted by this assay, and inadequate number of viral copies(<138 copies/mL). A negative result must be combined with clinical observations, patient history, and epidemiological information. The expected result is Negative.  Fact Sheet for Patients:  EntrepreneurPulse.com.au  Fact Sheet for Healthcare Providers:  IncredibleEmployment.be  This test is no t yet approved or cleared by the [place] FDA and  has been authorized for detection and/or diagnosis of SARS-CoV-2 by FDA under an Emergency Use Authorization (EUA). This EUA will remain  in effect (meaning this test can be used) for the duration of the COVID-19 declaration under Section 564(b)(1) of the Act, 21 U.S.C.section 360bbb-3(b)(1), unless the authorization is terminated  or revoked sooner.       Influenza A by PCR NEGATIVE NEGATIVE   Influenza B by PCR NEGATIVE NEGATIVE    Comment: (NOTE) The Xpert Xpress SARS-CoV-2/FLU/RSV plus assay is intended as an aid in the diagnosis of influenza from Nasopharyngeal swab specimens and should not be used as a sole basis for treatment. Nasal washings and aspirates are unacceptable for Xpert Xpress SARS-CoV-2/FLU/RSV testing.  Fact Sheet for Patients: EntrepreneurPulse.com.au  Fact Sheet for Healthcare Providers: IncredibleEmployment.be  This test is not yet approved or cleared by the [place] FDA and has been authorized for detection and/or diagnosis of SARS-CoV-2 by FDA under an Emergency Use Authorization (EUA). This EUA will  remain in effect (meaning this test can be used) for the duration of the COVID-19 declaration under Section 564(b)(1) of the Act, 21 U.S.C. section 360bbb-3(b)(1), unless the authorization is terminated or revoked.  Performed at [facility], [place] [address] [place], [place] [address]   Comprehensive metabolic panel     Status: Abnormal   Collection Time: 10/28/21  1:40 AM  Result Value Ref Range   Sodium 138 135 - 145 mmol/L   Potassium 4.0 3.5 - 5.1 mmol/L   Chloride 104 98 - 111 mmol/L   CO2 24 22 - 32 mmol/L   Glucose, Bld 143 (H) 70 - 99 mg/dL    Comment: Glucose reference range applies only to samples taken after fasting for at least 8 hours.   BUN 20 8 - 23 mg/dL   Creatinine, Ser 1.07 0.61 - 1.24 mg/dL   Calcium 9.3 8.9 - 10.3 mg/dL   Total Protein 6.8 6.5 - 8.1 g/dL   Albumin 3.9 3.5 - 5.0 g/dL   AST 26 15 - 41 U/L   ALT 17 0 -  44 U/L   Alkaline Phosphatase 63 38 - 126 U/L   Total Bilirubin 1.4 (H) 0.3 - 1.2 mg/dL   GFR, Estimated >60 >60 mL/min    Comment: (NOTE) Calculated using the CKD-EPI Creatinine Equation (2021)    Anion gap 10 5 - 15    Comment: Performed at [facility] [address], [place], [place] [address]  CBC with Differential     Status: Abnormal   Collection Time: 10/28/21  1:40 AM  Result Value Ref Range   WBC 10.5 4.0 - 10.5 K/uL   RBC 4.79 4.22 - 5.81 MIL/uL   Hemoglobin 13.6 13.0 - 17.0 g/dL   HCT 42.6 39.0 - 52.0 %   MCV 88.9 80.0 - 100.0 fL   MCH 28.4 26.0 - 34.0 pg   MCHC 31.9 30.0 - 36.0 g/dL   RDW 14.3 11.5 - 15.5 %   Platelets 164 150 - 400 K/uL   nRBC 0.0 0.0 - 0.2 %   Neutrophils Relative % 81 %   Neutro Abs 8.4 (H) 1.7 - 7.7 K/uL   Lymphocytes Relative 14 %   Lymphs Abs 1.5 0.7 - 4.0 K/uL   Monocytes Relative 4 %   Monocytes Absolute 0.4 0.1 - 1.0 K/uL   Eosinophils Relative 1 %   Eosinophils Absolute 0.1 0.0 - 0.5 K/uL   Basophils Relative 0 %   Basophils Absolute 0.0 0.0 - 0.1 K/uL   Immature Granulocytes  0 %   Abs Immature Granulocytes 0.04 0.00 - 0.07 K/uL    Comment: Performed at [facility] [address] [place], [place] [address]    DG Chest 1 View  Result Date: 10/28/2021 CLINICAL DATA:  Trauma EXAM: CHEST  1 VIEW COMPARISON:  CT chest dated 06/06/2012 FINDINGS: Mild left basilar atelectasis. Right lung is clear No pleural effusion or pneumothorax. The heart is normal in size. Right chest power port terminates in the upper right atrium. Mild deformity of the left humeral head with cancellous screw in the proximal humeral shaft. IMPRESSION: No evidence of acute cardiopulmonary disease. Electronically Signed   By: Julian Hy M.D.   On: 10/28/2021 02:25   DG Pelvis 1-2 Views  Result Date: 10/28/2021 CLINICAL DATA:  Trauma EXAM: PELVIS - 1-2 VIEW COMPARISON:  None. FINDINGS: Intertrochanteric right hip fracture with varus angulation. Status post ORIF of the left hip.  Visualized bony pelvis is intact. Prior ORIF of the right proximal femur, incompletely visualized. IMPRESSION: Intertrochanteric right hip fracture. Electronically Signed   By: Julian Hy M.D.   On: 10/28/2021 02:26   DG Femur Min 2 Views Right  Result Date: 10/28/2021 CLINICAL DATA:  Fall, leg shortened and externally rotated EXAM: RIGHT FEMUR 2 VIEWS COMPARISON:  None. FINDINGS: Intertrochanteric right hip fracture with foreshortening and varus angulation. Old right proximal femoral shaft fracture deformity, status post ORIF with IM nail and 2 proximal screws. Degenerative changes of the right knee. IMPRESSION: Intertrochanteric right hip fracture. Electronically Signed   By: Julian Hy M.D.   On: 10/28/2021 02:27    Review of Systems  HENT:  Negative for ear discharge, ear pain, hearing loss and tinnitus.   Eyes:  Negative for photophobia and pain.  Respiratory:  Negative for cough and shortness of breath.   Cardiovascular:  Negative for chest pain.  Gastrointestinal:  Negative for abdominal  pain, nausea and vomiting.  Genitourinary:  Negative for dysuria, flank pain, frequency and urgency.  Musculoskeletal:  Positive for arthralgias (Right hip).  Negative for back pain, myalgias and neck pain.  Neurological:  Negative for dizziness and headaches.  Hematological:  Does not bruise/bleed easily.  Psychiatric/Behavioral:  The patient is not nervous/anxious.   Blood pressure (!) 154/80, pulse 83, temperature 97.7 F (36.5 C), temperature source Oral, resp. rate 11, height 5\' 9"  (1.753 m), weight 79.4 kg, SpO2 97 %. Physical Exam Constitutional:      General: He is not in acute distress.    Appearance: He is well-developed. He is not diaphoretic.  HENT:     Head: Normocephalic and atraumatic.  Eyes:     General: No scleral icterus.       Right eye: No discharge.        Left eye: No discharge.     Conjunctiva/sclera: Conjunctivae normal.  Cardiovascular:     Rate and Rhythm: Normal rate and regular rhythm.  Pulmonary:     Effort: Pulmonary effort is normal. No respiratory distress.  Musculoskeletal:     Cervical back: Normal range of motion.     Comments: RLE No traumatic wounds, ecchymosis, or rash  Mod TTP hip  No knee or ankle effusion  Knee stable to varus/ valgus and anterior/posterior stress  Sens DPN, SPN, TN intact  Motor EHL, ext, flex, evers 5/5  DP 1+, PT 0, No significant edema  Skin:    General: Skin is warm and dry.  Neurological:     Mental Status: He is alert.  Psychiatric:        Mood and Affect: Mood normal.        Behavior: Behavior normal.    Assessment/Plan: Right hip fx -- Plan hardware removal, IMN today with Dr. Doreatha Martin. Please keep NPO. Multiple medical problems including HTN and hx/o lymphoma and BCC -- per primary service    Lisette Abu, PA-C Orthopedic Surgery [phone] 10/28/2021, 9:01 AM

## 2021-10-29 DIAGNOSIS — E871 Hypo-osmolality and hyponatremia: Secondary | ICD-10-CM

## 2021-10-29 DIAGNOSIS — S72001A Fracture of unspecified part of neck of right femur, initial encounter for closed fracture: Secondary | ICD-10-CM | POA: Diagnosis not present

## 2021-10-29 DIAGNOSIS — K219 Gastro-esophageal reflux disease without esophagitis: Secondary | ICD-10-CM

## 2021-10-29 DIAGNOSIS — K5900 Constipation, unspecified: Secondary | ICD-10-CM

## 2021-10-29 LAB — CBC
HCT: 33.4 % — ABNORMAL LOW (ref 39.0–52.0)
Hemoglobin: 10.9 g/dL — ABNORMAL LOW (ref 13.0–17.0)
MCH: 28.7 pg (ref 26.0–34.0)
MCHC: 32.6 g/dL (ref 30.0–36.0)
MCV: 87.9 fL (ref 80.0–100.0)
Platelets: 137 10*3/uL — ABNORMAL LOW (ref 150–400)
RBC: 3.8 MIL/uL — ABNORMAL LOW (ref 4.22–5.81)
RDW: 14.3 % (ref 11.5–15.5)
WBC: 14.7 10*3/uL — ABNORMAL HIGH (ref 4.0–10.5)
nRBC: 0 % (ref 0.0–0.2)

## 2021-10-29 LAB — BASIC METABOLIC PANEL
Anion gap: 7 (ref 5–15)
BUN: 18 mg/dL (ref 8–23)
CO2: 26 mmol/L (ref 22–32)
Calcium: 8.3 mg/dL — ABNORMAL LOW (ref 8.9–10.3)
Chloride: 98 mmol/L (ref 98–111)
Creatinine, Ser: 1.13 mg/dL (ref 0.61–1.24)
GFR, Estimated: >60 mL/min (ref >60)
Glucose, Bld: 192 mg/dL — ABNORMAL HIGH (ref 70–99)
Potassium: 4.2 mmol/L (ref 3.5–5.1)
Sodium: 131 mmol/L — ABNORMAL LOW (ref 135–145)

## 2021-10-29 MED ORDER — ALUM & MAG HYDROXIDE-SIMETH 200-200-20 MG/5ML PO SUSP: 15.0000 mL | Freq: Four times a day (QID) | ORAL | Status: DC | PRN

## 2021-10-29 MED ORDER — FAMOTIDINE 20 MG PO TABS
10.0000 mg | ORAL_TABLET | Freq: Two times a day (BID) | ORAL | Status: DC | PRN
  Administered 2021-10-29 – 2021-11-01 (×2): 10 mg via ORAL
  Filled 2021-10-29 (×2): qty 1

## 2021-10-29 MED ORDER — PANTOPRAZOLE SODIUM 40 MG PO TBEC
40.0000 mg | DELAYED_RELEASE_TABLET | Freq: Two times a day (BID) | ORAL | Status: DC
  Administered 2021-10-29 – 2021-10-31 (×5): 40 mg via ORAL
  Filled 2021-10-29 (×4): qty 1

## 2021-10-29 MED ORDER — SODIUM CHLORIDE 0.9% FLUSH
10.0000 mL | INTRAVENOUS | Status: DC | PRN
  Administered 2021-10-29 – 2021-11-07 (×3): 10 mL

## 2021-10-29 MED ORDER — CALCIUM CARBONATE ANTACID 500 MG PO CHEW
1.0000 | CHEWABLE_TABLET | Freq: Three times a day (TID) | ORAL | Status: DC | PRN
  Administered 2021-10-29 – 2021-10-30 (×2): 200 mg via ORAL
  Filled 2021-10-29 (×2): qty 1

## 2021-10-29 MED ORDER — BISACODYL 10 MG RE SUPP
10.0000 mg | Freq: Once | RECTAL | Status: AC
  Administered 2021-10-29: 10 mg via RECTAL
  Filled 2021-10-29: qty 1

## 2021-10-29 MED ORDER — CHLORHEXIDINE GLUCONATE CLOTH 2 % EX PADS
6.0000 | MEDICATED_PAD | Freq: Every day | CUTANEOUS | Status: DC
  Administered 2021-10-29 – 2021-11-10 (×12): 6 via TOPICAL

## 2021-10-29 MED ORDER — PHENOL 1.4 % MT LIQD: 1.0000 | OROMUCOSAL | Status: DC | PRN

## 2021-10-29 MED ORDER — ADULT MULTIVITAMIN W/MINERALS CH
1.0000 | ORAL_TABLET | Freq: Every day | ORAL | Status: DC
  Administered 2021-10-29 – 2021-11-10 (×11): 1 via ORAL
  Filled 2021-10-29 (×11): qty 1

## 2021-10-29 MED ORDER — ENSURE ENLIVE PO LIQD
237.0000 mL | Freq: Two times a day (BID) | ORAL | Status: DC
  Administered 2021-10-30 – 2021-11-02 (×6): 237 mL via ORAL
  Filled 2021-10-29: qty 237

## 2021-10-29 MED ORDER — SODIUM CHLORIDE 0.9% FLUSH
10.0000 mL | Freq: Two times a day (BID) | INTRAVENOUS | Status: DC
  Administered 2021-10-29 – 2021-11-10 (×24): 10 mL

## 2021-10-29 NOTE — Plan of Care (Signed)
  Problem: Activity: Goal: Risk for activity intolerance will decrease Outcome: Progressing   Problem: Nutrition: Goal: Adequate nutrition will be maintained Outcome: Progressing   Problem: Coping: Goal: Level of anxiety will decrease Outcome: Progressing   Problem: Elimination: Goal: Will not experience complications related to bowel motility Outcome: Progressing   Problem: Pain Managment: Goal: General experience of comfort will improve Outcome: Progressing   

## 2021-10-29 NOTE — Progress Notes (Signed)
Subjective: Patient reports pain as mild to moderate. Says the indigestion and pain he feels in his chest is worse than any hip pain. Swallowing is painful. Not able to eat or drink much. Able to pass gas. Doesn't feel bloated. No BM since Thursday. TUMS not helping. When he feels this way at home, he drinks a mixture of water and baking soda. Urinating.   No SOB.  Hasn't been able to mobilize OOB with PT yet.   Objective:   VITALS:   Vitals:   10/28/21 1744 10/28/21 2040 10/29/21 0518 10/29/21 0750  BP: (!) 159/92 (!) 186/92 (!) 152/74 140/75  Pulse: 88 84 86 88  Resp: 20 18 20 16   Temp: 98.7 F (37.1 C) 97.7 F (36.5 C) 97.7 F (36.5 C) 97.8 F (36.6 C)  TempSrc: Oral Oral Oral Oral  SpO2: 99% 97% 99% 99%  Weight:      Height:       CBC Latest Ref Rng & Units 10/29/2021 10/28/2021 10/28/2021  WBC 4.0 - 10.5 K/uL 14.7(H) 17.2(H) 10.5  Hemoglobin 13.0 - 17.0 g/dL 10.9(L) 13.0 13.6  Hematocrit 39.0 - 52.0 % 33.4(L) 39.0 42.6  Platelets 150 - 400 K/uL 137(L) 147(L) 164   BMP Latest Ref Rng & Units 10/29/2021 10/28/2021 10/28/2021  Glucose 70 - 99 mg/dL 192(H) - 143(H)  BUN 8 - 23 mg/dL 18 - 20  Creatinine 0.61 - 1.24 mg/dL 1.13 1.07 1.07  Sodium 135 - 145 mmol/L 131(L) - 138  Potassium 3.5 - 5.1 mmol/L 4.2 - 4.0  Chloride 98 - 111 mmol/L 98 - 104  CO2 22 - 32 mmol/L 26 - 24  Calcium 8.9 - 10.3 mg/dL 8.3(L) - 9.3   Intake/Output      02/10 0701 02/11 0700 02/11 0701 02/12 0700   P.O. 60    I.V. (mL/kg) 3100 (37)    IV Piggyback 200    Total Intake(mL/kg) 3360 (40.1)    Urine (mL/kg/hr) 1200 (0.6)    Blood 75    Total Output 1275    Net +2085            Physical Exam: General: NAD. Laying in bed, calm, talkative Resp: No increased wob Cardio: regular rate and rhythm ABD soft Neurologically intact MSK Neurovascularly intact Sensation intact distally Intact pulses distally Dorsiflexion/Plantar flexion intact Incision: dressing C/D/I   Assessment: 1 Day  Post-Op  S/P Procedure(s) (LRB): INTRAMEDULLARY (IM) NAIL INTERTROCHANTRIC (Right) HARDWARE REMOVAL (Right) by Dr. Doreatha Martin on 10/28/21  Principal Problem:   Closed right hip fracture ([place]) Active Problems:   Essential hypertension   Constipation   GERD (gastroesophageal reflux disease)   Hyponatremia   Plan: Ordered Maalox and Pepcid PRN since he says TUMS are not helping.   Will also order chloraseptic spray for sore throat which is likely from being intubated Encouraged him to increase fluid intake.  If CP continues then primary team may want to order EKG just to check for any abnormality other than indigestion causing the pain.  Advance diet Up with therapy Incentive Spirometry Elevate and Apply ice  Weightbearing: WBAT RLE Insicional and dressing care: OK to remove dressings Sunday and leave open to air with dry gauze PRN Orthopedic device(s): None Showering: Keep dressing dry VTE prophylaxis: Lovenox 40mg  qd , SCDs, ambulation Pain control: continue current regimen Follow - up plan: 2 weeks postop with Dr. Doreatha Martin   Dispo:  TBD based on PT/OT evals.  Will likely need SNF.     Abril Cappiello  [place] [place] Office [phone] 10/29/2021, 12:38 PM

## 2021-10-29 NOTE — Hospital Course (Addendum)
[name] [name] is a 81 y.o. male with medical history significant of HTN history of lymphoma and BCC who presents by EMS after a fall at home.  Found to have a hip fracture.  S/p repair on 2/10.  Developed significant odynophagia.  Seen by gastroenterology.  Initially given conservative treatment without any improvement.  Patient subsequently underwent EGD on 2/16 which showed severe esophagitis.   Symptoms are slowly improving.  He is able to tolerate soft.  Okay for discharge to skilled nursing facility.

## 2021-10-29 NOTE — TOC Progression Note (Signed)
Transition of Care [facility]) - Progression Note    Patient Details  Name: [name] [name] MRN: [id number] Date of Birth: 1941-04-28  Transition of Care [facility]) CM/SW [place], [place] Phone Number: 10/29/2021, 4:10 PM  Clinical Narrative:     CSW attempted to contact pt's daughter Mickel Baas @ [phone].  CSW left vm for a return call.         Expected Discharge Plan and Services                                                 Social Determinants of Health (SDOH) Interventions    Readmission Risk Interventions No flowsheet data found.

## 2021-10-29 NOTE — Assessment & Plan Note (Addendum)
Resolved

## 2021-10-29 NOTE — Evaluation (Signed)
Occupational Therapy Evaluation Patient Details Name: [name] [name] MRN: [id number] DOB: [birth year] Today's Date: 10/29/2021   History of Present Illness Pt is an 81 y/o male who presents s/p mechanical fall at home. He was found to have a periprosthetic intertrochanteric femur fracture and is now s/p cephalomedullary nailing on 10/28/2021. He is WBAT through the RLE. PMH significant for HTN, malignant neoplasm of pancreas, other malignant lymphomas, L femuir fracture s/p IM nail 09/2019.   Clinical Impression   Pt independent at baseline with ADLs and mobility, lives alone. Pt currently mod A for ADLs, min A+2 for bed mobility and transfers with RW. Pt reporting increased pain with movement and needing increased time/cuing for safety. Pt aware of deficits and agreeable to postacute rehab, prefers [name]. Pt presenting with impairments listed below, will follow acutely. Recommend SNF at d/c.     Recommendations for follow up therapy are one component of a multi-disciplinary discharge planning process, led by the attending physician.  Recommendations may be updated based on patient status, additional functional criteria and insurance authorization.   Follow Up Recommendations  Skilled nursing-short term rehab (<3 hours/day)    Assistance Recommended at Discharge Intermittent Supervision/Assistance  Patient can return home with the following A little help with walking and/or transfers;A lot of help with bathing/dressing/bathroom;Assistance with cooking/housework;Assist for transportation;Help with stairs or ramp for entrance    Functional Status Assessment  Patient has had a recent decline in their functional status and demonstrates the ability to make significant improvements in function in a reasonable and predictable amount of time.  Equipment Recommendations  None recommended by OT;Other (comment) (defer to next venue of care)    Recommendations for Other Services PT consult      Precautions / Restrictions Precautions Precautions: Fall Restrictions Weight Bearing Restrictions: Yes RLE Weight Bearing: Weight bearing as tolerated      Mobility Bed Mobility Overal bed mobility: Needs Assistance Bed Mobility: Supine to Sit     Supine to sit: Min assist, +2 for physical assistance     General bed mobility comments: VC's for sequencing and safety. Pt required assist for RLE advancement towards EOB. Increased time to get feet on the floor.    Transfers Overall transfer level: Needs assistance Equipment used: Rolling walker (2 wheels) Transfers: Sit to/from Stand, Bed to chair/wheelchair/BSC Sit to Stand: +2 physical assistance, Min assist     Step pivot transfers: Min assist, +2 physical assistance, +2 safety/equipment     General transfer comment: increased time and cuing needed for safety RLE elevation needed during stand > sit transfer      Balance Overall balance assessment: Needs assistance Sitting-balance support: Feet supported, No upper extremity supported Sitting balance-Leahy Scale: Fair     Standing balance support: Bilateral upper extremity supported, Reliant on assistive device for balance Standing balance-Leahy Scale: Poor                             ADL either performed or assessed with clinical judgement   ADL Overall ADL's : Needs assistance/impaired Eating/Feeding: Set up;Sitting   Grooming: Set up;Sitting   Upper Body Bathing: Minimal assistance;Sitting   Lower Body Bathing: Moderate assistance;Sitting/lateral leans   Upper Body Dressing : Minimal assistance;Sitting   Lower Body Dressing: Maximal assistance;Sitting/lateral leans Lower Body Dressing Details (indicate cue type and reason): to don socks Toilet Transfer: Minimal assistance;+2 for physical assistance Toilet Transfer Details (indicate cue type and reason): simulated x2 Toileting- Clothing  Manipulation and Hygiene:  Supervision/safety;Sitting/lateral lean Toileting - Clothing Manipulation Details (indicate cue type and reason): completes pericare in sitting     Functional mobility during ADLs: Minimal assistance;+2 for physical assistance       Vision   Vision Assessment?: No apparent visual deficits     Perception     Praxis      Pertinent Vitals/Pain Pain Assessment Pain Assessment: Faces Pain Score: 6  Faces Pain Scale: Hurts even more Pain Location: R hip Pain Descriptors / Indicators: Operative site guarding, Moaning, Grimacing, Sore Pain Intervention(s): Limited activity within patient's tolerance, Monitored during session, Premedicated before session, Repositioned     Hand Dominance Right   Extremity/Trunk Assessment Upper Extremity Assessment Upper Extremity Assessment: Overall WFL for tasks assessed   Lower Extremity Assessment Lower Extremity Assessment: Defer to PT evaluation RLE Deficits / Details: Decreased strength and AROM consistent with injury listed above and subsequent surgery.   Cervical / Trunk Assessment Cervical / Trunk Assessment: Normal   Communication Communication Communication: No difficulties   Cognition Arousal/Alertness: Awake/alert Behavior During Therapy: WFL for tasks assessed/performed Overall Cognitive Status: Within Functional Limits for tasks assessed                                       General Comments  VSS on RA SpO2 95%, O2 left off at end of session    Exercises     Shoulder Instructions      Home Living Family/patient expects to be discharged to:: Skilled nursing facility Living Arrangements: Alone                               Additional Comments: Pt lives alone and prefers [name] rehab at d/c      Prior Functioning/Environment Prior Level of Function : Independent/Modified Independent               ADLs Comments: does IADLs        OT Problem List: Decreased  strength;Decreased range of motion;Decreased activity tolerance;Impaired balance (sitting and/or standing);Pain;Decreased knowledge of use of DME or AE      OT Treatment/Interventions: Self-care/ADL training;Therapeutic exercise;Therapeutic activities;Patient/family education;Balance training;DME and/or AE instruction    OT Goals(Current goals can be found in the care plan section) Acute Rehab OT Goals Patient Stated Goal: to get better OT Goal Formulation: With patient Time For Goal Achievement: 11/12/21 Potential to Achieve Goals: Good ADL Goals Pt Will Perform Upper Body Dressing: with supervision;sitting Pt Will Perform Lower Body Dressing: with mod assist;sitting/lateral leans;sit to/from stand Pt Will Transfer to Toilet: with min guard assist;bedside commode;squat pivot transfer  OT Frequency: Min 2X/week    Co-evaluation PT/OT/SLP Co-Evaluation/Treatment: Yes Reason for Co-Treatment: Complexity of the patient's impairments (multi-system involvement);For patient/therapist safety;To address functional/ADL transfers PT goals addressed during session: Mobility/safety with mobility;Balance;Proper use of DME;Strengthening/ROM OT goals addressed during session: ADL's and self-care      AM-PAC OT "6 Clicks" Daily Activity     Outcome Measure Help from another person eating meals?: None Help from another person taking care of personal grooming?: A Little Help from another person toileting, which includes using toliet, bedpan, or urinal?: A Lot Help from another person bathing (including washing, rinsing, drying)?: A Lot Help from another person to put on and taking off regular upper body clothing?: A Little Help from another person to put on  and taking off regular lower body clothing?: Total 6 Click Score: 15   End of Session Equipment Utilized During Treatment: Gait belt;Rolling walker (2 wheels) Nurse Communication: Mobility status  Activity Tolerance: Patient tolerated treatment  well Patient left: in chair;with call bell/phone within reach;with chair alarm set  OT Visit Diagnosis: Unsteadiness on feet (R26.81);Other abnormalities of gait and mobility (R26.89);Muscle weakness (generalized) (M62.81)                Time: 2548-6282 OT Time Calculation (min): 36 min Charges:  OT General Charges $OT Visit: 1 Visit OT Evaluation $OT Eval Moderate Complexity: 1 [address] OTD, OTR/L Acute Rehab [phone]) [phone] - [place] 10/29/2021, 4:28 PM

## 2021-10-29 NOTE — Assessment & Plan Note (Addendum)
Remains on Protonix and Carafate.  See discussion under odynophagia.

## 2021-10-29 NOTE — Progress Notes (Signed)
Initial Nutrition Assessment RD working remotely.   DOCUMENTATION CODES:   Not applicable  INTERVENTION:  - will order Ensure Plus High Protein BID, each supplement provides 350 kcal and 20 grams of protein. - will order multivitamin with minerals daily - complete NFPE when feasible.   NUTRITION DIAGNOSIS:   Increased nutrient needs related to hip fracture, post-op healing as evidenced by estimated needs.  GOAL:   Patient will meet greater than or equal to 90% of their needs  MONITOR:   PO intake, Supplement acceptance, Labs, Weight trends  REASON FOR ASSESSMENT:   Consult Hip fracture protocol  ASSESSMENT:   81 y.o. male with medical history of HTN, lymphoma, and basal cell carcinoma of the skin, squamous cell carcinoma of the skim, malignant neoplasm of the pancreas. He presented to the ED via EMS after a fall at home and was found to have R hip fracture, s/p repair on 2/10.  He is POD #1 nailing of R intertrochanteric femur fracture and removal of hardware of R femur.   Diet advanced from NPO to Heart Healthy yesterday at 1808 and no meal completion percentages documented since that time.   He has not been seen by a [place] RD at any time in the past.  Weight yesterday was documented as both 185 lb and 175 lb. Weight on 09/27/21 was 175 lb. Prior to that, the most recently documented weight was in 08/2020.   MD note indicates plan for d/c is SNF.   Labs reviewed; Na: 131 mmol/l, Ca: 8.3 mg/dl.  Medications reviewed; 10 mg rectal dulcolax x1 dose 2/11, 100 mg colace BID, 40 mg oral protonix BID.  IVF; LR @ 100 ml/hr.    NUTRITION - FOCUSED PHYSICAL EXAM:  RD working remotely.  Diet Order:   Diet Order             Diet Heart Room service appropriate? Yes; Fluid consistency: Thin  Diet effective now                   EDUCATION NEEDS:   No education needs have been identified at this time  Skin:  Skin Assessment: Skin Integrity Issues: Skin  Integrity Issues:: Incisions Incisions: R hip (2/10)  Last BM:  PTA/unknown  Height:   Ht Readings from Last 1 Encounters:  10/28/21 5\' 9"  (1.753 m)    Weight:   Wt Readings from Last 1 Encounters:  10/28/21 83.8 kg     BMI:  Body mass index is 27.28 kg/m.   Estimated Nutritional Needs:  Kcal:  1800-2000 kcal Protein:  90-105 grams Fluid:  >/= 1.9 L/day      Jarome Matin, MS, RD, LDN Inpatient Clinical Dietitian RD pager # available in [place]  After hours/weekend pager # available in [facility]

## 2021-10-29 NOTE — Progress Notes (Signed)
°  Progress Note   Patient: [name] [name] TFT:[id number] DOB: Sep 13, 1941 DOA: 10/28/2021     1 DOS: the patient was seen and examined on 10/29/2021   Brief hospital course: NHAT HEARNE is a 81 y.o. male with medical history significant of HTN history of lymphoma and BCC who presents by EMS after a fall at home.  Found to have a hip fracture.  S/p repair on 2/10.  Most likely will need SNF placement.  Assessment and Plan: * Closed right hip fracture ([place])- (present on admission) -closed right hip fracture. S/p repair on 2/10 Dilaudid for pain control ordered.  -PT/OT  Hyponatremia Mild -daily labs  GERD (gastroesophageal reflux disease) Uses apple cidar vinegar at home for GERD symptoms -protonix and tums as needed  Constipation Bowel regimen   Essential hypertension- (present on admission) Continue lisinopril and norvasc. Monitor BP       Subjective: c/o acid reflux and hiccups  Physical Exam: Vitals:   10/28/21 1744 10/28/21 2040 10/29/21 0518 10/29/21 0750  BP: (!) 159/92 (!) 186/92 (!) 152/74 140/75  Pulse: 88 84 86 88  Resp: 20 18 20 16   Temp: 98.7 F (37.1 C) 97.7 F (36.5 C) 97.7 F (36.5 C) 97.8 F (36.6 C)  TempSrc: Oral Oral Oral Oral  SpO2: 99% 97% 99% 99%  Weight:      Height:        General: Appearance:     Overweight male in no acute distress     Lungs:     respirations unlabored  Heart:    Normal heart rate.    MS:   All extremities are intact.    Neurologic:   Awake, alert   Na slightly low    Disposition: Status is: Inpatient Remains inpatient appropriate because: needs PT/OT eval          Planned Discharge Destination: Skilled nursing facility    Author: Geradine Girt, DO 10/29/2021 11:45 AM  For on call review [email or web address].

## 2021-10-29 NOTE — Plan of Care (Signed)

## 2021-10-29 NOTE — Evaluation (Signed)
Physical Therapy Evaluation  Patient Details Name: [name] [name] MRN: [id number] DOB: [birth year] Today's Date: 10/29/2021  History of Present Illness  Pt is an 81 y/o male who presents s/p mechanical fall at home. He was found to have a periprosthetic intertrochanteric femur fracture and is now s/p cephalomedullary nailing on 10/28/2021. He is WBAT through the RLE. PMH significant for HTN, malignant neoplasm of pancreas, other malignant lymphomas, L femuir fracture s/p IM nail 09/2019.   Clinical Impression  This patient presents with acute pain and decreased functional independence following the above mentioned procedure. At the time of PT eval, pt was able to perform transfers with up to +2 min assist for balance support, walker management, and RLE movement. Pt limited by pain but overall with good rehab effort. Pt reports he lives alone and does not have adequate support to immediately return home at d/c. He would like to pursue SNF level rehab at [facility] if possible. Acutely, this patient is appropriate for skilled PT interventions to address functional limitations, improve safety and independence with functional mobility, and return to PLOF.        Recommendations for follow up therapy are one component of a multi-disciplinary discharge planning process, led by the attending physician.  Recommendations may be updated based on patient status, additional functional criteria and insurance authorization.  Follow Up Recommendations Skilled nursing-short term rehab (<3 hours/day)    Assistance Recommended at Discharge Frequent or constant Supervision/Assistance  Patient can return home with the following  A lot of help with walking and/or transfers;Assist for transportation;Help with stairs or ramp for entrance    Equipment Recommendations Rolling walker (2 wheels)  Recommendations for Other Services       Functional Status Assessment Patient has had a recent decline in their functional  status and demonstrates the ability to make significant improvements in function in a reasonable and predictable amount of time.     Precautions / Restrictions Precautions Precautions: Fall Restrictions Weight Bearing Restrictions: Yes RLE Weight Bearing: Weight bearing as tolerated      Mobility  Bed Mobility Overal bed mobility: Needs Assistance Bed Mobility: Supine to Sit     Supine to sit: Min assist, +2 for physical assistance     General bed mobility comments: VC's for sequencing and safety. Pt required assist for RLE advancement towards EOB. Increased time to get feet on the floor.    Transfers Overall transfer level: Needs assistance Equipment used: Rolling walker (2 wheels) Transfers: Sit to/from Stand, Bed to chair/wheelchair/BSC Sit to Stand: +2 physical assistance, Min assist   Step pivot transfers: Min assist, +2 physical assistance, +2 safety/equipment       General transfer comment: Assist for balance support, RLE management when preparing to sit, and walker management. VC's throughout for sequencing and safety.    Ambulation/Gait               General Gait Details: Unable to progress to gait training this session.  Stairs            Wheelchair Mobility    Modified Rankin (Stroke Patients Only)       Balance Overall balance assessment: Needs assistance Sitting-balance support: Feet supported, No upper extremity supported Sitting balance-Leahy Scale: Fair Sitting balance - Comments: Very guarded due to pain   Standing balance support: Bilateral upper extremity supported, Reliant on assistive device for balance Standing balance-Leahy Scale: Poor  Pertinent Vitals/Pain Pain Assessment Pain Assessment: Faces Faces Pain Scale: Hurts even more Pain Location: R hip Pain Descriptors / Indicators: Operative site guarding, Moaning, Grimacing, Sore Pain Intervention(s): Limited activity within  patient's tolerance, Monitored during session, Repositioned    Home Living Family/patient expects to be discharged to:: Skilled nursing facility Living Arrangements: Alone                 Additional Comments: Pt lives alone and prefers [name] at d/c    Prior Function Prior Level of Function : Independent/Modified Independent (Occasionally driving, able to grocery shop)                     Hand Dominance   Dominant Hand: Right    Extremity/Trunk Assessment   Upper Extremity Assessment Upper Extremity Assessment: Defer to OT evaluation    Lower Extremity Assessment Lower Extremity Assessment: RLE deficits/detail RLE Deficits / Details: Decreased strength and AROM consistent with injury listed above and subsequent surgery.    Cervical / Trunk Assessment Cervical / Trunk Assessment: Normal  Communication   Communication: No difficulties  Cognition Arousal/Alertness: Awake/alert Behavior During Therapy: WFL for tasks assessed/performed Overall Cognitive Status: Within Functional Limits for tasks assessed                                          General Comments      Exercises     Assessment/Plan    PT Assessment Patient needs continued PT services  PT Problem List Decreased strength;Decreased activity tolerance;Decreased balance;Decreased mobility;Decreased knowledge of use of DME       PT Treatment Interventions DME instruction;Gait training;Functional mobility training;Therapeutic activities;Therapeutic exercise;Balance training;Patient/family education    PT Goals (Current goals can be found in the Care Plan section)  Acute Rehab PT Goals Patient Stated Goal: Eventually return home alone PT Goal Formulation: With patient Time For Goal Achievement: 11/12/21 Potential to Achieve Goals: Good    Frequency Min 3X/week     Co-evaluation PT/OT/SLP Co-Evaluation/Treatment: Yes Reason for Co-Treatment: For  patient/therapist safety;To address functional/ADL transfers PT goals addressed during session: Mobility/safety with mobility;Balance;Proper use of DME;Strengthening/ROM         AM-PAC PT "6 Clicks" Mobility  Outcome Measure Help needed turning from your back to your side while in a flat bed without using bedrails?: A Lot Help needed moving from lying on your back to sitting on the side of a flat bed without using bedrails?: A Lot Help needed moving to and from a bed to a chair (including a wheelchair)?: A Lot Help needed standing up from a chair using your arms (e.g., wheelchair or bedside chair)?: A Lot Help needed to walk in hospital room?: Total Help needed climbing 3-5 steps with a railing? : Total 6 Click Score: 10    End of Session Equipment Utilized During Treatment: Gait belt Activity Tolerance: Patient tolerated treatment well Patient left: in chair;with call bell/phone within reach;with chair alarm set Nurse Communication: Mobility status PT Visit Diagnosis: Unsteadiness on feet (R26.81);Pain Pain - part of body:  (R hip)    Time: 6629-4765 PT Time Calculation (min) (ACUTE ONLY): 39 min   Charges:   PT Evaluation $PT Eval Moderate Complexity: 1 Mod PT Treatments $Gait Training: 8-22 mins        Rolinda Roan, PT, DPT Acute Rehabilitation Services Pager: [phone] Office: [phone]   Clarene Duke  Kearstyn Avitia 10/29/2021, 3:08 PM

## 2021-10-29 NOTE — Assessment & Plan Note (Addendum)
TSH 4.8, free T41.15.   He was placed on aggressive bowel regimen.  Had to be given a smog enema.  Finally having bowel movements.  Continue aggressive bowel regimen for now.  As his mobility improves his constipation is also resolved.

## 2021-10-29 NOTE — Plan of Care (Signed)

## 2021-10-30 ENCOUNTER — Encounter: Payer: Self-pay | Admitting: Dermatology

## 2021-10-30 DIAGNOSIS — K5900 Constipation, unspecified: Secondary | ICD-10-CM | POA: Diagnosis not present

## 2021-10-30 DIAGNOSIS — E871 Hypo-osmolality and hyponatremia: Secondary | ICD-10-CM | POA: Diagnosis not present

## 2021-10-30 DIAGNOSIS — S72001A Fracture of unspecified part of neck of right femur, initial encounter for closed fracture: Secondary | ICD-10-CM | POA: Diagnosis not present

## 2021-10-30 LAB — BASIC METABOLIC PANEL
Anion gap: 6 (ref 5–15)
BUN: 18 mg/dL (ref 8–23)
CO2: 28 mmol/L (ref 22–32)
Calcium: 8.1 mg/dL — ABNORMAL LOW (ref 8.9–10.3)
Chloride: 102 mmol/L (ref 98–111)
Creatinine, Ser: 1.07 mg/dL (ref 0.61–1.24)
GFR, Estimated: >60 mL/min (ref >60)
Glucose, Bld: 131 mg/dL — ABNORMAL HIGH (ref 70–99)
Potassium: 4.1 mmol/L (ref 3.5–5.1)
Sodium: 136 mmol/L (ref 135–145)

## 2021-10-30 LAB — CBC
HCT: 27.4 % — ABNORMAL LOW (ref 39.0–52.0)
Hemoglobin: 8.9 g/dL — ABNORMAL LOW (ref 13.0–17.0)
MCH: 28.7 pg (ref 26.0–34.0)
MCHC: 32.5 g/dL (ref 30.0–36.0)
MCV: 88.4 fL (ref 80.0–100.0)
Platelets: 110 10*3/uL — ABNORMAL LOW (ref 150–400)
RBC: 3.1 MIL/uL — ABNORMAL LOW (ref 4.22–5.81)
RDW: 14.6 % (ref 11.5–15.5)
WBC: 13 10*3/uL — ABNORMAL HIGH (ref 4.0–10.5)
nRBC: 0 % (ref 0.0–0.2)

## 2021-10-30 MED ORDER — METHOCARBAMOL 1000 MG/10ML IJ SOLN
500.0000 mg | Freq: Once | INTRAVENOUS | Status: AC
  Administered 2021-10-30: 500 mg via INTRAVENOUS
  Filled 2021-10-30: qty 500
  Filled 2021-10-30: qty 5

## 2021-10-30 NOTE — NC FL2 (Signed)
[place] LEVEL OF CARE SCREENING TOOL     IDENTIFICATION  Patient Name: [name] [name] Birthdate: [birth year] Sex: male Admission Date (Current Location): 10/28/2021  [facility] [place] Number:  Herbalist and Address:  [facility], [place] [address] [place], [place] [address]      Provider Number: [id number]  Attending Physician Name and Address:  Geradine Girt, DO  Relative Name and Phone Number:       Current Level of Care: Hospital Recommended Level of Care: [place] Prior Approval Number:    Date Approved/Denied:   PASRR Number: [id number]  Discharge Plan: SNF    Current Diagnoses: Patient Active Problem List   Diagnosis Date Noted   Constipation 10/29/2021   GERD (gastroesophageal reflux disease) 10/29/2021   Hyponatremia 10/29/2021   Closed right hip fracture ([place]) 10/28/2021   Multiple facial fractures, closed, initial encounter ([place]) 02/04/2020   Closed left hip fracture ([place]) 10/04/2019   Essential hypertension 10/04/2019   Fall    Port-A-Cath in place 02/04/2019   Thrombocytopenia ([place]) 01/22/2018   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   History of arthroscopy of left shoulder 07/12/2016   Port catheter in place 29/92/4268   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   Vitamin D deficiency 11/21/2012   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    Orientation RESPIRATION BLADDER Height & Weight     Self, Time, Situation, Place  Normal Continent Weight: 184 lb 11.9 oz (83.8 kg) Height:  5\' 9"  (175.3 cm)  BEHAVIORAL SYMPTOMS/MOOD NEUROLOGICAL BOWEL NUTRITION STATUS      Continent Diet (please see discharge summary)  AMBULATORY STATUS COMMUNICATION OF NEEDS Skin   Limited Assist Verbally Surgical wounds (closed incision Right Leg)                       Personal Care Assistance Level of Assistance   Bathing, Feeding, Dressing Bathing Assistance: Limited assistance Feeding assistance: Independent Dressing Assistance: Limited assistance     Functional Limitations Info  Sight, Hearing, Speech Sight Info: Adequate Hearing Info: Adequate Speech Info: Adequate    SPECIAL CARE FACTORS FREQUENCY  PT (By licensed PT), OT (By licensed OT)     PT Frequency: 5x per week OT Frequency: 5x per week            Contractures Contractures Info: Not present    Additional Factors Info  Code Status, Allergies Code Status Info: FULL Allergies Info: Morphine And Related, shellfish,Iohexol,Sulfa Antibiotics           Current Medications (10/30/2021):  This is the current hospital active medication list Current Facility-Administered Medications  Medication Dose Route Frequency Provider Last Rate Last Admin   alum & mag hydroxide-simeth (MAALOX/MYLANTA) 200-200-20 MG/5ML suspension 15 mL  15 mL Oral Q6H PRN Gawne, Meghan M, PA-C       amLODipine (NORVASC) tablet 5 mg  5 mg Oral Daily [name] A, PA-C   5 mg at 10/30/21 3419   calcium carbonate (TUMS - dosed in mg elemental calcium) chewable tablet 200 mg of elemental calcium  1 tablet Oral TID PRN Eulogio Bear U, DO   200 mg of elemental calcium at 10/30/21 1247   Chlorhexidine Gluconate Cloth 2 % PADS 6 each  6 each Topical Daily Chotiner, Yevonne Aline, MD   6 each at 10/30/21 0837   docusate sodium (COLACE) capsule  100 mg  100 mg Oral BID Corinne Ports, PA-C   100 mg at 10/30/21 0836   enoxaparin (LOVENOX) injection 40 mg  40 mg Subcutaneous Q24H Corinne Ports, PA-C   40 mg at 10/30/21 0837   famotidine (PEPCID) tablet 10 mg  10 mg Oral BID PRN Britt Bottom, PA-C   10 mg at 10/29/21 2028   feeding supplement (ENSURE ENLIVE / ENSURE PLUS) liquid 237 mL  237 mL Oral BID BM Eulogio Bear U, DO   237 mL at 10/30/21 2330   lactated ringers infusion   Intravenous Continuous Eulogio Bear U, DO 50 mL/hr at 10/30/21 1248 Rate Change at  10/30/21 1248   lisinopril (ZESTRIL) tablet 20 mg  20 mg Oral Daily Corinne Ports, PA-C   20 mg at 10/30/21 0836   metoCLOPramide (REGLAN) tablet 5-10 mg  5-10 mg Oral Q8H PRN Corinne Ports, PA-C   10 mg at 10/30/21 1247   Or   metoCLOPramide (REGLAN) injection 5-10 mg  5-10 mg Intravenous Q8H PRN Corinne Ports, PA-C   10 mg at 10/29/21 1237   multivitamin with minerals tablet 1 tablet  1 tablet Oral Daily Eulogio Bear U, DO   1 tablet at 10/30/21 0836   ondansetron (ZOFRAN) tablet 4 mg  4 mg Oral Q6H PRN Corinne Ports, PA-C       Or   ondansetron (ZOFRAN) injection 4 mg  4 mg Intravenous Q6H PRN Corinne Ports, PA-C   4 mg at 10/29/21 0617   pantoprazole (PROTONIX) EC tablet 40 mg  40 mg Oral BID Eulogio Bear U, DO   40 mg at 10/30/21 0836   phenol (CHLORASEPTIC) mouth spray 1 spray  1 spray Mouth/Throat PRN Aggie Moats M, PA-C       polyethylene glycol (MIRALAX / GLYCOLAX) packet 17 g  17 g Oral Daily PRN Thereasa Solo, Sarah A, PA-C       senna-docusate (Senokot-S) tablet 1 tablet  1 tablet Oral QHS PRN Corinne Ports, PA-C       sodium chloride flush (NS) 0.9 % injection 10-40 mL  10-40 mL Intracatheter Q12H Chotiner, Yevonne Aline, MD   10 mL at 10/30/21 0762   sodium chloride flush (NS) 0.9 % injection 10-40 mL  10-40 mL Intracatheter PRN Chotiner, Yevonne Aline, MD   10 mL at 10/29/21 2633   traMADol (ULTRAM) tablet 50-100 mg  50-100 mg Oral Q6H PRN Corinne Ports, PA-C   100 mg at 10/30/21 1241   Facility-Administered Medications Ordered in Other Encounters  Medication Dose Route Frequency Provider Last Rate Last Admin   heparin lock flush 100 unit/mL  500 Units Intravenous Once PRN Curt Bears, MD       sodium chloride 0.9 % injection 10 mL  10 mL Intravenous PRN Curt Bears, MD   10 mL at 12/29/14 1026   sodium chloride 0.9 % injection 10 mL  10 mL Intravenous PRN Curt Bears, MD         Discharge Medications: Please see discharge summary for a list of  discharge medications.  Relevant Imaging Results:  Relevant Lab Results:   Additional Information SSN [id number]  Myleah Cavendish N Morgann Woodburn, LCSW

## 2021-10-30 NOTE — Progress Notes (Signed)
Follow-Up Visit   Subjective  [name] [name] is a 81 y.o. male who presents for the following: Skin Problem (Lesion on left ear x months, bleeding. Lesion on left cheek x months. Personal history of bcc and scc. ).  Multiple new growths scalp near Location:  Duration:  Quality:  Associated Signs/Symptoms: Modifying Factors:  Severity:  Timing: Context:   Objective  Well appearing patient in no apparent distress; mood and affect are within normal limits. Left Buccal Cheek Hornlike 3 mm pink crust  Left Scaphoid Fossa 7Millimeter waxy crust     Mid Parietal Scalp superior 1.6 cm thick pink crust     Mid Occipital Scalp inferior 2 cm thick pink crust     left occipital scalp 0.7 cm thick pink crust       A focused examination was performed including the neck. Relevant physical exam findings are noted in the Assessment and Plan.   Assessment & Plan    AK (actinic keratosis) Left Buccal Cheek  Destruction of lesion - Left Buccal Cheek Complexity: simple   Destruction method: cryotherapy   Informed consent: discussed and consent obtained   Timeout:  patient name, date of birth, surgical site, and procedure verified Lesion destroyed using liquid nitrogen: Yes   Cryotherapy cycles:  3 Outcome: patient tolerated procedure well with no complications   Post-procedure details: wound care instructions given    Neoplasm of uncertain behavior Left Scaphoid Fossa  Skin / nail biopsy Type of biopsy: tangential   Informed consent: discussed and consent obtained   Timeout: patient name, date of birth, surgical site, and procedure verified   Anesthesia: the lesion was anesthetized in a standard fashion   Anesthetic:  1% lidocaine w/ epinephrine 1-100,000 local infiltration Instrument used: flexible razor blade   Hemostasis achieved with: aluminum chloride and electrodesiccation   Outcome: patient tolerated procedure well   Post-procedure details: wound care  instructions given    Destruction of lesion Complexity: simple   Destruction method: electrodesiccation and curettage   Informed consent: discussed and consent obtained   Timeout:  patient name, date of birth, surgical site, and procedure verified Anesthesia: the lesion was anesthetized in a standard fashion   Anesthetic:  1% lidocaine w/ epinephrine 1-100,000 local infiltration Curettage performed in three different directions: Yes   Curettage cycles:  3 Margin per side (cm):  0.1 Final wound size (cm):  0.7 Hemostasis achieved with:  aluminum chloride Outcome: patient tolerated procedure well with no complications   Post-procedure details: wound care instructions given    Specimen 1 - Surgical pathology Differential Diagnosis: sccvs bcc txpbx Check Margins: No  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 (3) Mid Parietal Scalp superior  Skin / nail biopsy Type of biopsy: tangential   Informed consent: discussed and consent obtained   Timeout: patient name, date of birth, surgical site, and procedure verified   Anesthesia: the lesion was anesthetized in a standard fashion   Anesthetic:  1% lidocaine w/ epinephrine 1-100,000 local infiltration Instrument used: flexible razor blade   Hemostasis achieved with: aluminum chloride and electrodesiccation   Outcome: patient tolerated procedure well   Post-procedure details: wound care instructions given    Destruction of lesion Complexity: simple   Destruction method: electrodesiccation and curettage   Informed consent: discussed and consent obtained   Timeout:  patient name, date of birth, surgical site, and procedure verified Anesthesia: the lesion was anesthetized in a standard fashion   Anesthetic:  1% lidocaine w/ epinephrine  1-100,000 local infiltration Curettage performed in three different directions: Yes   Curettage cycles:  3 Lesion length (cm):  1.6 Lesion width (cm):  1.6 Margin per side (cm):  0 Final wound size  (cm):  1.6 Hemostasis achieved with:  aluminum chloride Outcome: patient tolerated procedure well with no complications   Post-procedure details: wound care instructions given    Specimen 2 - Surgical pathology Differential Diagnosis: scc vs bcc txpbx Check Margins: No  Mid Occipital Scalp inferior  Skin / nail biopsy Type of biopsy: tangential   Informed consent: discussed and consent obtained   Timeout: patient name, date of birth, surgical site, and procedure verified   Anesthesia: the lesion was anesthetized in a standard fashion   Anesthetic:  1% lidocaine w/ epinephrine 1-100,000 local infiltration Instrument used: flexible razor blade   Hemostasis achieved with: aluminum chloride and electrodesiccation   Outcome: patient tolerated procedure well   Post-procedure details: wound care instructions given    Destruction of lesion Complexity: simple   Destruction method: electrodesiccation and curettage   Informed consent: discussed and consent obtained   Timeout:  patient name, date of birth, surgical site, and procedure verified Anesthesia: the lesion was anesthetized in a standard fashion   Anesthetic:  1% lidocaine w/ epinephrine 1-100,000 local infiltration Curettage performed in three different directions: Yes   Curettage cycles:  3 Lesion length (cm):  2.1 Lesion width (cm):  2.1 Margin per side (cm):  0 Final wound size (cm):  2.1 Hemostasis achieved with:  aluminum chloride Outcome: patient tolerated procedure well with no complications   Post-procedure details: wound care instructions given    Specimen 3 - Surgical pathology Differential Diagnosis: scc vs bcc txpbx Check Margins: No  left occipital scalp  Skin / nail biopsy Type of biopsy: tangential   Informed consent: discussed and consent obtained   Timeout: patient name, date of birth, surgical site, and procedure verified   Anesthesia: the lesion was anesthetized in a standard fashion   Anesthetic:   1% lidocaine w/ epinephrine 1-100,000 local infiltration Instrument used: flexible razor blade   Hemostasis achieved with: aluminum chloride and electrodesiccation   Outcome: patient tolerated procedure well   Post-procedure details: wound care instructions given    Destruction of lesion Complexity: simple   Destruction method: electrodesiccation and curettage   Informed consent: discussed and consent obtained   Timeout:  patient name, date of birth, surgical site, and procedure verified Anesthesia: the lesion was anesthetized in a standard fashion   Anesthetic:  1% lidocaine w/ epinephrine 1-100,000 local infiltration Curettage performed in three different directions: Yes   Curettage cycles:  3 Lesion length (cm):  1.9 Lesion width (cm):  1.9 Margin per side (cm):  0 Final wound size (cm):  1.9 Hemostasis achieved with:  aluminum chloride Outcome: patient tolerated procedure well with no complications   Post-procedure details: wound care instructions given    Specimen 4 - Surgical pathology Differential Diagnosis: scc vs bcc txpbx Check Margins: No      I, Lavonna Monarch, MD, have reviewed all documentation for this visit.  The documentation on 10/30/21 for the exam, diagnosis, procedures, and orders are all accurate and complete.

## 2021-10-30 NOTE — TOC Initial Note (Signed)
Transition of Care [facility]) - Initial/Assessment Note    Patient Details  Name: [name] [name] MRN: [id number] Date of Birth: [birth year]  Transition of Care [facility]) CM/SW Contact:    Gabrielle Dare Phone Number: 10/30/2021, 4:23 PM  Clinical Narrative:                 CSW received a consult to reach out to pt's daughter Lattie Haw.  Pt's daugher Lattie Haw needed information on how to select SNF's for possible placement of pt.  CSW referred pt's daughter to medicare.gov site for information.  Pt's daughter reports pt went to a SNF 2 years ago for rehab.  Pt's daughter had private care assistance a home for pt after return from SNF for a year.  Pt lived at home alone until current hospitalization.  Pt's daughter lives 10 minutes from pt's home.  Pt's daughter is agreeable for SNF placement for rehab.  Pt's daughters have selected [facility] and [facility] in [place].  TOC will continue to assist with disposition planning..   Expected Discharge Plan: Skilled Nursing Facility Barriers to Discharge: Continued Medical Work up, Ship broker, SNF Pending bed offer   Patient Goals and CMS Choice Patient states their goals for this hospitalization and ongoing recovery are:: Daughter states " get more PT at [facility] and nutritious meals CMS Medicare.gov Compare Post Acute Care list provided to:: Patient Represenative (must comment) (information given to pt's daughter [email or web address] Choice offered to / list presented to : Adult Children  Expected Discharge Plan and Services Expected Discharge Plan: [place] In-house Referral: Clinical Social Work     Living arrangements for the past 2 months: [place]                                      Prior Living Arrangements/Services Living arrangements for the past 2 months: [place] with:: Self Patient language and need for interpreter reviewed:: Yes Do you feel safe going back to the  place where you live?: Yes      Need for Family Participation in Patient Care: Yes (Comment) Care giver support system in place?: Yes (comment)   Criminal Activity/Legal Involvement Pertinent to Current Situation/Hospitalization: No - Comment as needed  Activities of Daily Living Home Assistive Devices/Equipment: Cane (specify quad or straight), Walker (specify type), Wheelchair ADL Screening (condition at time of admission) Patient's cognitive ability adequate to safely complete daily activities?: Yes Is the patient deaf or have difficulty hearing?: No Does the patient have difficulty seeing, even when wearing glasses/contacts?: No Does the patient have difficulty concentrating, remembering, or making decisions?: No Patient able to express need for assistance with ADLs?: Yes Does the patient have difficulty dressing or bathing?: No Independently performs ADLs?: Yes (appropriate for developmental age) Communication: Independent Dressing (OT): Independent Grooming: Independent Feeding: Independent Bathing: Independent Toileting: Independent In/Out Bed: Independent Walks in Home: Independent Does the patient have difficulty walking or climbing stairs?: Yes Weakness of Legs: Both Weakness of Arms/Hands: None  Permission Sought/Granted Permission sought to share information with : Case Freight forwarder, Customer service manager, Family Supports Permission granted to share information with : Yes, Verbal Permission Granted (VErbal permission given by daughters)  Share Information with NAME: Vonzell Schlatter & Hilaria Ota  Permission granted to share info w AGENCY: SNF  Permission granted to share info w Relationship: Daughters  Permission granted to share info w Contact  Information: Yes  Emotional Assessment Appearance:: Appears stated age Attitude/Demeanor/Rapport: Engaged Affect (typically observed): Appropriate Orientation: : Oriented to Self, Oriented to Place, Oriented to  Time,  Oriented to Situation Alcohol / Substance Use: Not Applicable Psych Involvement: No (comment)  Admission diagnosis:  Vomiting [R11.10] Fall [W19.XXXA] Closed right hip fracture ([place]) [S72.001A] Closed displaced intertrochanteric fracture of right femur, initial encounter ([place]) [S72.141A] Patient Active Problem List   Diagnosis Date Noted   Constipation 10/29/2021   GERD (gastroesophageal reflux disease) 10/29/2021   Hyponatremia 10/29/2021   Closed right hip fracture ([place]) 10/28/2021   Multiple facial fractures, closed, initial encounter ([place]) 02/04/2020   Closed left hip fracture ([place]) 10/04/2019   Essential hypertension 10/04/2019   Fall    Port-A-Cath in place 02/04/2019   Thrombocytopenia ([place]) 01/22/2018   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 AT [address] [place] [address] Phone: [phone] Fax: [phone]     Social Determinants of Health (SDOH) Interventions    Readmission Risk Interventions No flowsheet data found.

## 2021-10-30 NOTE — Progress Notes (Signed)
°  Progress Note   Patient: [name] [name]:[id number] DOB: [birth year] DOA: 10/28/2021     2 DOS: the patient was seen and examined on 10/30/2021   Brief hospital course: [name] [name] is a 81 y.o. male with medical history significant of HTN history of lymphoma and BCC who presents by EMS after a fall at home.  Found to have a hip fracture.  S/p repair on 2/10.  Most likely will need SNF placement.  Assessment and Plan: * Closed right hip fracture ([place])- (present on admission) -closed right hip fracture. S/p repair on 2/10 Dilaudid for pain control ordered.  -PT/OT  Hyponatremia Mild -daily labs  GERD (gastroesophageal reflux disease) Uses apple cidar vinegar at home for GERD symptoms -protonix and tums as needed  Constipation Bowel regimen   Essential hypertension- (present on admission) Continue lisinopril and norvasc. Monitor BP   C/o feeling like food gets stuck and/or is slow to pass in his esophagus  -dg esophagus in AM to r/o stricture -no vomiting -of note patient has a spray bottle of colloid silver at the bedside    Nutrition Status: Nutrition Problem: Increased nutrient needs Etiology: hip fracture, post-op healing Signs/Symptoms: estimated needs Interventions: Ensure Enlive (each supplement provides 350kcal and 20 grams of protein), MVI    Subjective: c/o feeling like food is having a hard time going into his stomach   Physical Exam: Vitals:   10/29/21 1443 10/29/21 2051 10/30/21 0414 10/30/21 0809  BP: (!) 107/55 120/70 123/67 118/66  Pulse: (!) 103 (!) 101 90 86  Resp: 17 18 20 16   Temp: 98.3 F (36.8 C) 99.6 F (37.6 C) 97.8 F (36.6 C) 97.8 F (36.6 C)  TempSrc: Oral Oral Oral Oral  SpO2: 95% 97% 98% 96%  Weight:      Height:        General: Appearance:     Overweight male in no acute distress     Lungs:     respirations unlabored  Heart:    Normal heart rate. Normal rhythm. No murmurs, rubs, or gallops.    MS:   All extremities  are intact.    Neurologic:   Awake, alert, oriented x 3. No apparent focal neurological           defect.      Data Reviewed: Na normalized WBC trending down    Disposition: Status is: Inpatient Remains inpatient appropriate because: needs SNF placement and DG esophagus          Planned Discharge Destination: Skilled nursing facility   Time spent: 45 minutes  Author: Geradine Girt, DO 10/30/2021 11:49 AM  For on call review [email or web address].

## 2021-10-30 NOTE — Plan of Care (Signed)

## 2021-10-30 NOTE — Progress Notes (Signed)
° ° °  Subjective: Patient reports pain as moderate. Knee starting to hurt. Says the indigestion and pain he feels in his chest is still worse than any leg pain. Swallowing is painful. TUMS and Pepcid not helping. Ordered Maalox but not given yet. Able to eat and drink a small bit more. Able to pass gas. Doesn't feel bloated. Had BM.  Urinating.  No SOB.  Working with PT/OT on mobilizing OOB.   Objective:   VITALS:   Vitals:   10/29/21 1443 10/29/21 2051 10/30/21 0414 10/30/21 0809  BP: (!) 107/55 120/70 123/67 118/66  Pulse: (!) 103 (!) 101 90 86  Resp: 17 18 20 16   Temp: 98.3 F (36.8 C) 99.6 F (37.6 C) 97.8 F (36.6 C) 97.8 F (36.6 C)  TempSrc: Oral Oral Oral Oral  SpO2: 95% 97% 98% 96%  Weight:      Height:       CBC Latest Ref Rng & Units 10/30/2021 10/29/2021 10/28/2021  WBC 4.0 - 10.5 K/uL 13.0(H) 14.7(H) 17.2(H)  Hemoglobin 13.0 - 17.0 g/dL 8.9(L) 10.9(L) 13.0  Hematocrit 39.0 - 52.0 % 27.4(L) 33.4(L) 39.0  Platelets 150 - 400 K/uL 110(L) 137(L) 147(L)   BMP Latest Ref Rng & Units 10/30/2021 10/29/2021 10/28/2021  Glucose 70 - 99 mg/dL 131(H) 192(H) -  BUN 8 - 23 mg/dL 18 18 -  Creatinine 0.61 - 1.24 mg/dL 1.07 1.13 1.07  Sodium 135 - 145 mmol/L 136 131(L) -  Potassium 3.5 - 5.1 mmol/L 4.1 4.2 -  Chloride 98 - 111 mmol/L 102 98 -  CO2 22 - 32 mmol/L 28 26 -  Calcium 8.9 - 10.3 mg/dL 8.1(L) 8.3(L) -   Intake/Output      02/11 0701 02/12 0700 02/12 0701 02/13 0700   P.O.     I.V. (mL/kg)     IV Piggyback     Total Intake(mL/kg)     Urine (mL/kg/hr) 400 (0.2)    Blood     Total Output 400    Net -400            Physical Exam: General: NAD. Laying in bed, calm, comfortable Resp: No increased wob Cardio: regular rate and rhythm ABD soft Neurologically intact MSK Neurovascularly intact Sensation intact distally Intact pulses distally Dorsiflexion/Plantar flexion intact Incision: dressing C/D/I Ice to knee   Assessment: 2 Days Post-Op  S/P  Procedure(s) (LRB): INTRAMEDULLARY (IM) NAIL INTERTROCHANTRIC (Right) HARDWARE REMOVAL (Right) by Dr. Doreatha Martin on 10/28/21  Principal Problem:   Closed right hip fracture ([place]) Active Problems:   Essential hypertension   Constipation   GERD (gastroesophageal reflux disease)   Hyponatremia   Plan: Ordered Maalox and Pepcid PRN since he says TUMS are not helping. Looks like he has an endoscopy ordered for tomorrow.  Advance diet Up with therapy Incentive Spirometry Elevate and Apply ice  Weightbearing: WBAT RLE Insicional and dressing care: since dressings looked clean and patient in pain I deferred the dressing change today, reinforce dressings PRN Orthopedic device(s): None Showering: Keep dressing dry VTE prophylaxis: Lovenox 40mg  qd , SCDs, ambulation Pain control: continue current regimen Follow - up plan: 2 weeks postop with Dr. Doreatha Martin   Dispo:  PT/OT recommending SNF.      Britt Bottom, PA-C Office [phone] 10/30/2021, 11:34 AM

## 2021-10-31 ENCOUNTER — Encounter (HOSPITAL_COMMUNITY): Payer: Self-pay | Admitting: Student

## 2021-10-31 ENCOUNTER — Inpatient Hospital Stay (HOSPITAL_COMMUNITY): Payer: Medicare HMO

## 2021-10-31 DIAGNOSIS — R1310 Dysphagia, unspecified: Secondary | ICD-10-CM

## 2021-10-31 DIAGNOSIS — S72001A Fracture of unspecified part of neck of right femur, initial encounter for closed fracture: Secondary | ICD-10-CM | POA: Diagnosis not present

## 2021-10-31 DIAGNOSIS — E871 Hypo-osmolality and hyponatremia: Secondary | ICD-10-CM | POA: Diagnosis not present

## 2021-10-31 DIAGNOSIS — K5900 Constipation, unspecified: Secondary | ICD-10-CM | POA: Diagnosis not present

## 2021-10-31 LAB — CBC
HCT: 26.7 % — ABNORMAL LOW (ref 39.0–52.0)
Hemoglobin: 8.8 g/dL — ABNORMAL LOW (ref 13.0–17.0)
MCH: 29.5 pg (ref 26.0–34.0)
MCHC: 33 g/dL (ref 30.0–36.0)
MCV: 89.6 fL (ref 80.0–100.0)
Platelets: 123 10*3/uL — ABNORMAL LOW (ref 150–400)
RBC: 2.98 MIL/uL — ABNORMAL LOW (ref 4.22–5.81)
RDW: 14.4 % (ref 11.5–15.5)
WBC: 8.2 10*3/uL (ref 4.0–10.5)
nRBC: 0 % (ref 0.0–0.2)

## 2021-10-31 LAB — BASIC METABOLIC PANEL
Anion gap: 4 — ABNORMAL LOW (ref 5–15)
BUN: 14 mg/dL (ref 8–23)
CO2: 30 mmol/L (ref 22–32)
Calcium: 7.8 mg/dL — ABNORMAL LOW (ref 8.9–10.3)
Chloride: 102 mmol/L (ref 98–111)
Creatinine, Ser: 0.93 mg/dL (ref 0.61–1.24)
GFR, Estimated: >60 mL/min (ref >60)
Glucose, Bld: 95 mg/dL (ref 70–99)
Potassium: 3.7 mmol/L (ref 3.5–5.1)
Sodium: 136 mmol/L (ref 135–145)

## 2021-10-31 MED ORDER — VITAMIN D 25 MCG (1000 UNIT) PO TABS
10000.0000 [IU] | ORAL_TABLET | Freq: Every day | ORAL | Status: DC
  Administered 2021-10-31 – 2021-11-03 (×3): 10000 [IU] via ORAL
  Administered 2021-11-09: 1000 [IU] via ORAL
  Filled 2021-10-31 (×7): qty 10

## 2021-10-31 MED ORDER — PANTOPRAZOLE SODIUM 40 MG IV SOLR
40.0000 mg | Freq: Two times a day (BID) | INTRAVENOUS | Status: DC
  Administered 2021-10-31 – 2021-11-10 (×20): 40 mg via INTRAVENOUS
  Filled 2021-10-31 (×21): qty 10

## 2021-10-31 MED ORDER — LIDOCAINE VISCOUS HCL 2 % MT SOLN
15.0000 mL | OROMUCOSAL | Status: DC | PRN
  Administered 2021-11-01: 15 mL via OROMUCOSAL
  Filled 2021-10-31 (×5): qty 15

## 2021-10-31 MED ORDER — VITAMIN D3 25 MCG PO TABS: 15000.0000 [IU] | ORAL_TABLET | Freq: Every day | ORAL | Status: DC

## 2021-10-31 MED ORDER — MAGNESIUM OXIDE -MG SUPPLEMENT 400 (240 MG) MG PO TABS
400.0000 mg | ORAL_TABLET | Freq: Every day | ORAL | Status: DC
  Administered 2021-10-31 – 2021-11-10 (×9): 400 mg via ORAL
  Filled 2021-10-31 (×9): qty 1

## 2021-10-31 MED ORDER — SUCRALFATE 1 GM/10ML PO SUSP
1.0000 g | Freq: Three times a day (TID) | ORAL | Status: DC
  Administered 2021-10-31 – 2021-11-02 (×12): 1 g via ORAL
  Filled 2021-10-31 (×11): qty 10

## 2021-10-31 NOTE — Progress Notes (Signed)
°  Progress Note   Patient: [name] [name] XBM:[id number] DOB: 08-19-1941 DOA: 10/28/2021     3 DOS: the patient was seen and examined on 10/31/2021   Brief hospital course: [name] [name] is a 81 y.o. male with medical history significant of HTN history of lymphoma and BCC who presents by EMS after a fall at home.  Found to have a hip fracture.  S/p repair on 2/10.  Most likely will need SNF placement.  Assessment and Plan: * Closed right hip fracture ([place])- (present on admission) -closed right hip fracture. S/p repair on 2/10 Dilaudid for pain control ordered.  -PT/OT- SNF placement -expected post op anemia from ABL- daily labs  Hyponatremia Mild -daily labs  GERD (gastroesophageal reflux disease) Uses apple cidar vinegar at home for GERD symptoms -protonix and tums as needed -DG Esophagus normal -GI consult  Constipation Bowel regimen   Essential hypertension- (present on admission) Continue lisinopril and norvasc     Subjective: still with epigastric pain Does not want to eat  Physical Exam: Vitals:   10/30/21 1450 10/30/21 1955 10/31/21 0442 10/31/21 0721  BP: 107/64 (!) 118/59 131/73 131/67  Pulse: 84 82 77 78  Resp: 17 17 20 16   Temp: 97.7 F (36.5 C) 98.2 F (36.8 C) 97.6 F (36.4 C) (!) 97.5 F (36.4 C)  TempSrc: Oral Oral  Oral  SpO2: 98% 98% 99% 99%  Weight:      Height:        General: Appearance:     Overweight male in no acute distress     Lungs:     respirations unlabored  Heart:    Normal heart rate.     MS:   All extremities are intact.    Neurologic:   Awake, alert     Data Reviewed: Hgb 8.8  Family Communication: none at bedside  Disposition: Status is: Inpatient Remains inpatient appropriate because: needs to eat/GI eval          Planned Discharge Destination: Skilled nursing facility   Time spent: 45 minutes  Author: Geradine Girt, DO 10/31/2021 3:19 PM  For on call review [email or web address].

## 2021-10-31 NOTE — Progress Notes (Signed)
Mobility Specialist Progress Note    10/31/21 1225  Mobility  Bed Position Chair  Activity Transferred from bed to chair  Level of Assistance +2 (takes two people) (safety)  Assistive Device Front wheel walker  RLE Weight Bearing WBAT  Distance Ambulated (ft) 4 ft  Activity Response Tolerated fair  $Mobility charge 1 Mobility   Pt received in bed and agreeable. No complaints. Left with chair alarm on and LPN present.   [facility]ter [facility] Mobility Specialist  M.S. 5N: [phone]

## 2021-10-31 NOTE — TOC CAGE-AID Note (Signed)
Transition of Care [facility]) - CAGE-AID Screening   Patient Details  Name: [name] [name] MRN: [id number] Date of Birth: [birth year]  Transition of [facility]) CM/SW Contact:    Gaetano Hawthorne Tarpley-Carter, [place] Phone Number: 10/31/2021, 12:30 PM   Clinical Narrative: Pt participated in [place].  Pt stated he does not use substance or ETOH.  Pt was not offered resources, due to no usage of substance or ETOH.    Ameet Sandy Tarpley-Carter, MSW, LCSW-A Pronouns:  She/Her/Hers [place] Transitions of Care Clinical Social Worker Direct Number:  [phone] [email or web address]  CAGE-AID Screening:    Have You Ever Felt You Ought to Cut Down on Your Drinking or Drug Use?: No Have People Annoyed You By [employer] Your Drinking Or Drug Use?: No Have You Felt Bad Or Guilty About Your Drinking Or Drug Use?: No Have You Ever Had a Drink or Used Drugs First Thing In The Morning to Steady Your Nerves or to Get Rid of a Hangover?: No CAGE-AID Score: 0  Substance Abuse Education Offered: No

## 2021-10-31 NOTE — Care Management Important Message (Signed)
Important Message  Patient Details  Name: [name] [name] MRN: [id number] Date of Birth: 1941/08/14   Medicare Important Message Given:  Yes     [name] 10/31/2021, 12:11 PM

## 2021-10-31 NOTE — Consult Note (Addendum)
Attending physician's note   I have taken a history, reviewed the chart, and examined the patient. I performed a substantive portion of this encounter, including complete performance of at least one of the key components, in conjunction with the APP. I agree with the APP's note, impression, and recommendations with my edits.   81 year old male with history of HTN, chronic BCC, admitted with right hip fracture s/p surgery on 10/28/2021, with subsequent odynophagia, pointing to subxiphoid area.  No prior similar symptoms.  Not dysphagia per se.  Described as sharp, burning pain lasting 5-6 seconds.  No radiation.  Does report a history of reflux, and will take OTC remedy 2-3 days/week.  Esophagram was normal including normal passage of 13 mm barium tablet.  1) Odynophagia 2) GERD DDx includes reflux esophagitis, pill esophagitis, less likely infection - High dose PPI - Carafate scheduled - Viscous lidocaine -Continue slowly advancing p.o. intake as tolerated - If symptoms persist over the next 24-48 hours, plan for EGD - I discussed all of this with the patient at length along with his family member at bedside  3) Right hip fracture 4) ABLA - Normocytic anemia in setting of recent hip fracture.  Otherwise no overt GI blood loss - Continue trending serial  CBC  Gerrit Heck, DO, FACG [phone] office         Consultation  Referring Provider: TRH/ Eliseo Squires DO Primary Care Physician:  [name], MD Primary Gastroenterologist:  none  Reason for Consultation odynophagia  HPI: MONTAE STAGER is a 81 y.o. male, who was admitted to the hospital on 10/28/2020 after he fell at home and sustained a right intertrochanteric  s/p .  He underwent surgical repair on 10/28/2020. Patient also has history of hypertension, hx lymphoma. He says that he has occasional GERD at home usually only if he eats spicy foods and eats late.  He will treat this with baking soda or on fermented  sauerkraut at times which she says works well.  He has not had any issues with dysphagia or abdominal pain. He believes that he had very remote EGD and colonoscopy greater than 20 years ago.  He was not having any issues with his esophagus prior to admission.  He says that since he underwent the surgery on 10/28/2020 he is now having painful swallowing of liquids and solids.  He is feeling the discomfort in the subxiphoid area and says that anything that hits this area is burning and painful.  He has been trying to just take very tiny sips of liquids and a protein shake this morning.  He says he can work through the pain if he needs to but wanted to be sure that he was not hurting himself by doing this.  Barium swallow was done this morning which was read as normal.   Labs reviewed, hemoglobin 8.8/hematocrit 26.7 this a.m./postop hemoglobin 13.0 on admit  Past Medical History:  Diagnosis Date   Basal cell carcinoma 06/08/1998   Upper lip - CX3+5FU   Basal cell carcinoma 06/22/1998   Left front neck - CX3+5FU   Basal cell carcinoma 03/11/2004   Right temple [facility])   Basal cell carcinoma 04/24/2016   Left top shoulder - Clear   Basal cell carcinoma 01/14/2018   Right sideburn - CX3+5FU   Basal cell carcinoma 06/08/2020   sup & nod- left postauricular sulcus   Femur fracture (HCC)    Hip fracture (HCC)    Hypertension    Malignant neoplasm of  pancreas, part unspecified    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   Right Ear Superior   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   Right Sideburn   Other malignant lymphomas, unspecified site, extranodal and solid organ sites    Squamous cell carcinoma of skin 09/26/2001   Left forehead - CX3+excision   Squamous cell carcinoma of skin 03/11/2004   Post right scalp - CX3+5FU   Squamous cell carcinoma of skin 03/11/2004   Left forehead (MOHs)   Squamous cell carcinoma of skin 03/11/2004   Right sideburn  - CX3+5FU+excision   Squamous cell carcinoma of skin 12/01/2004   Left temple - CX3+excision   Squamous cell carcinoma of skin 04/13/2006   Mid scalp, sup - tx p bx   Squamous cell carcinoma of skin 04/13/2006   Mid scalp, inf - tx p bx   Squamous cell carcinoma of skin 04/13/2006   Left temple - tx p bx   Squamous cell carcinoma of skin 06/30/2010   Left low back - tx p bx   Squamous cell carcinoma of skin 06/23/2013   Front scalp - CX3+5FU   Squamous cell carcinoma of skin 01/14/2018   Left scalp - CX3+5FU    Past Surgical History:  Procedure Laterality Date   FEMUR IM NAIL Left 10/06/2019   Procedure: INTRAMEDULLARY (IM) RETROGRADE FEMORAL NAIL REMOVAL ( [employer]);  Surgeon: Marybelle Killings, MD;  Location: [place];  Service: Orthopedics;  Laterality: Left;   FEMUR SURGERY Bilateral    INTRAMEDULLARY (IM) NAIL INTERTROCHANTERIC Left 10/06/2019   Procedure: BIOMET AFFIXUS SHORT NAIL,  INTERTROCHANTRIC;  Surgeon: Marybelle Killings, MD;  Location: [place];  Service: Orthopedics;  Laterality: Left;   LEG SURGERY Left    SHOULDER SURGERY Left     Prior to Admission medications   Medication Sig Start Date End Date Taking? Authorizing Provider  amLODipine (NORVASC) 5 MG tablet Take 5 mg by mouth daily. 06/02/20  Yes [provider]  APPLE CIDER VINEGAR PO Take 1 capsule by mouth at bedtime.   Yes [provider]  Ascorbic Acid (VITAMIN C) 1000 MG tablet Take 1,000 mg by mouth daily. Pt states he takes 6,000mg    Yes [provider]  cholecalciferol (VITAMIN D) 1000 UNITS tablet Take 15,000 Units by mouth daily.    Yes [provider]  lisinopril (ZESTRIL) 20 MG tablet Take 20 mg by mouth daily.   Yes [provider]  magnesium oxide (MAG-OX) 400 MG tablet Take 400 mg by mouth daily. "I take 500mg /day"   Yes [provider]  Multiple Vitamin (MULTIVITAMIN) tablet Take 6 tablets by mouth daily.   Yes [provider]   multivitamin-lutein (OCUVITE-LUTEIN) CAPS capsule Take 1 capsule by mouth daily.   Yes [provider]  multivitamin-lutein (OCUVITE-LUTEIN) CAPS capsule Take 1 capsule by mouth daily.   Yes [provider]  Probiotic Product (PROBIOTIC FORMULA PO) Take 1 tablet by mouth daily.   Yes [provider]  Turmeric (QC TUMERIC COMPLEX PO) Take 1 tablet by mouth daily.   Yes [provider]  Zinc 30 MG TABS Take 1 tablet by mouth every morning. Pt states he takes 44mcg.   Yes [provider]  Aflibercept 2 MG/0.05ML SOLN Inject 0.5 mLs into the eye as directed. Take every 6 wks in eye doctor office.    [provider]  ketoconazole (NIZORAL) 2 % cream APPLY TOPICALLY ONCE DAILY FOR 14 DAYS Patient not taking:  Reported on 10/28/2021 12/09/19   [provider]  rosuvastatin (CRESTOR) 5 MG tablet Take 5 mg by mouth once a week. Patient not taking: Reported on 10/28/2021 03/11/20   [provider]  tamsulosin (FLOMAX) 0.4 MG CAPS capsule Take 0.4 mg by mouth daily. Patient not taking: Reported on 10/28/2021 10/18/19   [provider]    Current Facility-Administered Medications  Medication Dose Route Frequency Provider Last Rate Last Admin   alum & mag hydroxide-simeth (MAALOX/MYLANTA) 200-200-20 MG/5ML suspension 15 mL  15 mL Oral Q6H PRN Gawne, Meghan M, PA-C       amLODipine (NORVASC) tablet 5 mg  5 mg Oral Daily Corinne Ports, PA-C   5 mg at 10/31/21 1016   calcium carbonate (TUMS - dosed in mg elemental calcium) chewable tablet 200 mg of elemental calcium  1 tablet Oral TID PRN Eulogio Bear U, DO   200 mg of elemental calcium at 10/30/21 1247   Chlorhexidine Gluconate Cloth 2 % PADS 6 each  6 each Topical Daily Chotiner, Yevonne Aline, MD   6 each at 10/31/21 1018   docusate sodium (COLACE) capsule 100 mg  100 mg Oral BID [name] A, PA-C   100 mg at 10/31/21 1016   enoxaparin (LOVENOX) injection 40 mg  40 mg  Subcutaneous Q24H [name] A, PA-C   40 mg at 10/31/21 1018   famotidine (PEPCID) tablet 10 mg  10 mg Oral BID PRN Britt Bottom, PA-C   10 mg at 10/29/21 2028   feeding supplement (ENSURE ENLIVE / ENSURE PLUS) liquid 237 mL  237 mL Oral BID BM Vann, Jessica U, DO   237 mL at 10/31/21 1018   lisinopril (ZESTRIL) tablet 20 mg  20 mg Oral Daily [name] A, PA-C   20 mg at 10/31/21 1016   metoCLOPramide (REGLAN) tablet 5-10 mg  5-10 mg Oral Q8H PRN Corinne Ports, PA-C   5 mg at 10/30/21 2106   Or   metoCLOPramide (REGLAN) injection 5-10 mg  5-10 mg Intravenous Q8H PRN Corinne Ports, PA-C   10 mg at 10/29/21 1237   multivitamin with minerals tablet 1 tablet  1 tablet Oral Daily Eulogio Bear U, DO   1 tablet at 10/31/21 1016   ondansetron (ZOFRAN) tablet 4 mg  4 mg Oral Q6H PRN Corinne Ports, PA-C       Or   ondansetron (ZOFRAN) injection 4 mg  4 mg Intravenous Q6H PRN Corinne Ports, PA-C   4 mg at 10/29/21 0617   pantoprazole (PROTONIX) EC tablet 40 mg  40 mg Oral BID Vann, Jessica U, DO   40 mg at 10/31/21 1016   phenol (CHLORASEPTIC) mouth spray 1 spray  1 spray Mouth/Throat PRN Gawne, Meghan M, PA-C       polyethylene glycol (MIRALAX / GLYCOLAX) packet 17 g  17 g Oral Daily PRN Thereasa Solo, Sarah A, PA-C       senna-docusate (Senokot-S) tablet 1 tablet  1 tablet Oral QHS PRN Corinne Ports, PA-C       sodium chloride flush (NS) 0.9 % injection 10-40 mL  10-40 mL Intracatheter Q12H Chotiner, Yevonne Aline, MD   10 mL at 10/30/21 2109   sodium chloride flush (NS) 0.9 % injection 10-40 mL  10-40 mL Intracatheter PRN Chotiner, Yevonne Aline, MD   10 mL at 10/29/21 0332   traMADol (ULTRAM) tablet 50-100 mg  50-100 mg Oral Q6H PRN [name] A, PA-C   100  mg at 10/31/21 0241   Facility-Administered Medications Ordered in Other Encounters  Medication Dose Route Frequency Provider Last Rate Last Admin   heparin lock flush 100 unit/mL  500 Units Intravenous Once PRN Curt Bears,  MD       sodium chloride 0.9 % injection 10 mL  10 mL Intravenous PRN Curt Bears, MD   10 mL at 12/29/14 1026   sodium chloride 0.9 % injection 10 mL  10 mL Intravenous PRN Curt Bears, MD        Allergies as of 10/28/2021 - Review Complete 10/28/2021  Allergen Reaction Noted   Morphine and related Anxiety 10/04/2019   Shellfish allergy  10/04/2019   Iohexol  08/02/2007   Other Other (See Comments) 06/25/2020   Sulfa antibiotics Hives 09/18/2011    History reviewed. No pertinent family history.  Social History   Socioeconomic History   Marital status: Widowed    Spouse name: Not on file   Number of children: Not on file   Years of education: Not on file   Highest education level: Not on file  Occupational History   Not on file  Tobacco Use   Smoking status: Never   Smokeless tobacco: Never  Vaping Use   Vaping Use: Never used  Substance and Sexual Activity   Alcohol use: No   Drug use: No   Sexual activity: Not on file  Other Topics Concern   Not on file  Social History Narrative   Not on file   Social Determinants of Health   Financial Resource Strain: Not on file  Food Insecurity: Not on file  Transportation Needs: Not on file  Physical Activity: Not on file  Stress: Not on file  Social Connections: Not on file  Intimate Partner Violence: Not on file    Review of Systems: Pertinent positive and negative review of systems were noted in the above HPI section.  All other review of systems was otherwise negative.   Physical Exam: Vital signs in last 24 hours: Temp:  [97.5 F (36.4 C)-98.2 F (36.8 C)] 97.5 F (36.4 C) (02/13 0721) Pulse Rate:  [77-84] 78 (02/13 0721) Resp:  [16-20] 16 (02/13 0721) BP: (107-131)/(59-73) 131/67 (02/13 0721) SpO2:  [98 %-99 %] 99 % (02/13 0721) Last BM Date: 10/29/21 General:   Alert,  Well-developed, well-nourished, elderly white male pleasant and cooperative in NAD Head:  Normocephalic and atraumatic. Eyes:   Sclera clear, no icterus.   Conjunctiva pink. Ears:  Normal auditory acuity. Nose:  No deformity, discharge,  or lesions. Mouth:  No deformity or lesions.   Neck:  Supple; no masses or thyromegaly. Lungs:  Clear throughout to auscultation.   No wheezes, crackles, or rhonchi. Heart:  Regular rate and rhythm; no murmurs, clicks, rubs,  or gallops.,  No chest wall pain Abdomen:  Soft,nontender, BS active,nonpalp mass or hsm.   Rectal:  Deferred  Msk:  Symmetrical without gross deformities. . Pulses:  Normal pulses noted. Extremities: Right hip dressed Neurologic:  Alert and  oriented x4;  grossly normal neurologically. Skin:  Intact without significant lesions or rashes.. Psych:  Alert and cooperative. Normal mood and affect.  Intake/Output from previous day: 02/12 0701 - 02/13 0700 In: 1978.4 [I.V.:1978.4] Out: 700 [Urine:700] Intake/Output this shift: No intake/output data recorded.  Lab Results: Recent Labs    10/29/21 0321 10/30/21 0406 10/31/21 0414  WBC 14.7* 13.0* 8.2  HGB 10.9* 8.9* 8.8*  HCT 33.4* 27.4* 26.7*  PLT 137* 110* 123*   BMET  Recent Labs    10/29/21 0321 10/30/21 0406 10/31/21 0414  NA 131* 136 136  K 4.2 4.1 3.7  CL 98 102 102  CO2 26 28 30   GLUCOSE 192* 131* 95  BUN 18 18 14   CREATININE 1.13 1.07 0.93  CALCIUM 8.3* 8.1* 7.8*   LFT No results for input(s): PROT, ALBUMIN, AST, ALT, ALKPHOS, BILITOT, BILIDIR, IBILI in the last 72 hours. PT/INR No results for input(s): LABPROT, INR in the last 72 hours. Hepatitis Panel No results for input(s): HEPBSAG, HCVAB, HEPAIGM, HEPBIGM in the last 72 hours.     IMPRESSION:  #66 81 year old white male status post right hip fracture 10/28/2020 secondary to fall at home, status postrepair 10/28/2020  #2 acute odynophagia postoperatively-in setting of occasional GERD symptoms at home, on no regular medication  I suspect he may have an acute distal esophagitis secondary to prolonged lying position,  anesthesia etc. Normal barium swallow is reassuring  #3 normocytic anemia acute postoperative secondary to blood loss #4 hypertension #5 history of lymphoma  PLAN: We will change to full liquid diet, continue protein supplements Would be ideal if could elevate head of bed 45 degrees but may be uncomfortable with his hip Change PPI to IV Protonix every 12 hours Add Carafate suspension 1 g between meals and at bedtime Add viscous lidocaine 15 cc every 3 hours as needed for esophageal pain.  Please give 30 minutes before meals Not sure he needs to be on Reglan we will discontinue Hopefully he will improve in the next 24 to 36 hours, if not then can pursue EGD.   Amy Esterwood PA-C 10/31/2021, 10:19 AM

## 2021-10-31 NOTE — TOC Progression Note (Addendum)
Transition of Care [facility]) - Progression Note    Patient Details  Name: DONNOVAN STAMOUR MRN: [id number] Date of Birth: 05-Nov-1940  Transition of [facility]) CM/SW Contact  Joanne Chars, LCSW Phone Number: 10/31/2021, 10:38 AM  Clinical Narrative:   Pt referral sent out in hub for SNF, also faxed to [facility] crossing.  1145: TC Simona Huh, [employer].  They cannot make bed offer for this pt.    Expected Discharge Plan: [place] Barriers to Discharge: Continued Medical Work up, Ship broker, SNF Pending bed offer  Expected Discharge Plan and Services Expected Discharge Plan: [place] In-house Referral: Clinical Social Work     Living arrangements for the past 2 months: Single Family Home                                       Social Determinants of Health (SDOH) Interventions    Readmission Risk Interventions No flowsheet data found.

## 2021-10-31 NOTE — Anesthesia Postprocedure Evaluation (Signed)
Anesthesia Post Note  Patient: [name] [name]  Procedure(s) Performed: INTRAMEDULLARY (IM) NAIL INTERTROCHANTRIC (Right) HARDWARE REMOVAL (Right)     Patient location during evaluation: PACU Anesthesia Type: General Level of consciousness: awake and alert Pain management: pain level controlled Vital Signs Assessment: post-procedure vital signs reviewed and stable Respiratory status: spontaneous breathing, nonlabored ventilation, respiratory function stable and patient connected to nasal cannula oxygen Cardiovascular status: blood pressure returned to baseline and stable Postop Assessment: no apparent nausea or vomiting Anesthetic complications: no   No notable events documented.  Last Vitals:  Vitals:   10/31/21 0442 10/31/21 0721  BP: 131/73 131/67  Pulse: 77 78  Resp: 20 16  Temp: 36.4 C (!) 36.4 C  SpO2: 99% 99%    Last Pain:  Vitals:   10/31/21 0721  TempSrc: Oral  PainSc:                  Tiajuana Amass

## 2021-11-01 DIAGNOSIS — S72001A Fracture of unspecified part of neck of right femur, initial encounter for closed fracture: Secondary | ICD-10-CM | POA: Diagnosis not present

## 2021-11-01 DIAGNOSIS — K5900 Constipation, unspecified: Secondary | ICD-10-CM | POA: Diagnosis not present

## 2021-11-01 DIAGNOSIS — K219 Gastro-esophageal reflux disease without esophagitis: Secondary | ICD-10-CM

## 2021-11-01 MED ORDER — TRAMADOL HCL 50 MG PO TABS: 50.0000 mg | ORAL_TABLET | Freq: Four times a day (QID) | ORAL | 0 refills | Status: DC | PRN

## 2021-11-01 MED ORDER — BISACODYL 10 MG RE SUPP
10.0000 mg | Freq: Once | RECTAL | Status: AC
  Administered 2021-11-01: 10 mg via RECTAL
  Filled 2021-11-01: qty 1

## 2021-11-01 MED ORDER — APIXABAN 2.5 MG PO TABS: 2.5000 mg | ORAL_TABLET | Freq: Two times a day (BID) | ORAL | 0 refills | Status: DC

## 2021-11-01 MED ORDER — BISACODYL 10 MG RE SUPP: 10.0000 mg | Freq: Every day | RECTAL | Status: DC | PRN

## 2021-11-01 NOTE — Discharge Instructions (Addendum)
[facility]th[facility]uma Service Discharge Instructions   General Discharge Instructions  WEIGHT BEARING STATUS:Weightbearing as tolerated  RANGE OF MOTION/ACTIVITY: Ok for unrestricted hip and knee motion  Wound Care: Incisions can be left open to air if there is no drainage. If incision continues to have drainage, follow wound care instructions below. Okay to shower if no drainage from incisions.  DVT/PE prophylaxis:  Eliquis 2.5 mg twice daily x 30 days  Diet: as you were eating previously.  Can use over the counter stool softeners and bowel preparations, such as Miralax, to help with bowel movements.  Narcotics can be constipating.  Be sure to drink plenty of fluids  PAIN MEDICATION USE AND EXPECTATIONS  You have likely been given narcotic medications to help control your pain.  After a traumatic event that results in an fracture (broken bone) with or without surgery, it is ok to use narcotic pain medications to help control one's pain.  We understand that everyone responds to pain differently and each individual patient will be evaluated on a regular basis for the continued need for narcotic medications. Ideally, narcotic medication use should last no more than 6-8 weeks (coinciding with fracture healing).   As a patient it is your responsibility as well to monitor narcotic medication use and report the amount and frequency you use these medications when you come to your office visit.   We would also advise that if you are using narcotic medications, you should take a dose prior to therapy to maximize you participation.  IF YOU ARE ON NARCOTIC MEDICATIONS IT IS NOT PERMISSIBLE TO OPERATE A MOTOR VEHICLE (MOTORCYCLE/CAR/TRUCK/MOPED) OR HEAVY MACHINERY DO NOT MIX NARCOTICS WITH OTHER CNS (CENTRAL NERVOUS SYSTEM) DEPRESSANTS SUCH AS ALCOHOL   STOP SMOKING OR USING NICOTINE PRODUCTS!!!!  As discussed nicotine severely impairs your body's ability to heal surgical and traumatic wounds but also  impairs bone healing.  Wounds and bone heal by forming microscopic blood vessels (angiogenesis) and nicotine is a vasoconstrictor (essentially, shrinks blood vessels).  Therefore, if vasoconstriction occurs to these microscopic blood vessels they essentially disappear and are unable to deliver necessary nutrients to the healing tissue.  This is one modifiable factor that you can do to dramatically increase your chances of healing your injury.    (This means no smoking, no nicotine gum, patches, etc)  DO NOT USE NONSTEROIDAL ANTI-INFLAMMATORY DRUGS (NSAID'S)  Using products such as Advil (ibuprofen), Aleve (naproxen), Motrin (ibuprofen) for additional pain control during fracture healing can delay and/or prevent the healing response.  If you would like to take over the counter (OTC) medication, Tylenol (acetaminophen) is ok.  However, some narcotic medications that are given for pain control contain acetaminophen as well. Therefore, you should not exceed more than 4000 mg of tylenol in a day if you do not have liver disease.  Also note that there are may OTC medicines, such as cold medicines and allergy medicines that my contain tylenol as well.  If you have any questions about medications and/or interactions please ask your doctor/PA or your pharmacist.      ICE AND ELEVATE INJURED/OPERATIVE EXTREMITY  Using ice and elevating the injured extremity above your heart can help with swelling and pain control.  Icing in a pulsatile fashion, such as 20 minutes on and 20 minutes off, can be followed.    Do not place ice directly on skin. Make sure there is a barrier between to skin and the ice pack.    Using frozen items such as  frozen peas works well as the conform nicely to the are that needs to be iced.  USE AN ACE WRAP OR TED HOSE FOR SWELLING CONTROL  In addition to icing and elevation, Ace wraps or TED hose are used to help limit and resolve swelling.  It is recommended to use Ace wraps or TED hose until  you are informed to stop.    When using Ace Wraps start the wrapping distally (farthest away from the body) and wrap proximally (closer to the body)   Example: If you had surgery on your leg or thing and you do not have a splint on, start the ace wrap at the toes and work your way up to the thigh        If you had surgery on your upper extremity and do not have a splint on, start the ace wrap at your fingers and work your way up to the upper arm   [place]: [phone]   VISIT OUR WEBSITE FOR ADDITIONAL INFORMATION: [email or web address]   Discharge Wound Care Instructions  Do NOT apply any ointments, solutions or lotions to pin sites or surgical wounds.  These prevent needed drainage and even though solutions like hydrogen peroxide kill bacteria, they also damage cells lining the pin sites that help fight infection.  Applying lotions or ointments can keep the wounds moist and can cause them to breakdown and open up as well. This can increase the risk for infection. When in doubt call the office.  If any drainage is noted, use one layer of adaptic, then gauze, Kerlix, and an ace wrap.  Once the incision is completely dry and without drainage, it may be left open to air out.  Showering may begin 36-48 hours later.  Cleaning gently with soap and water.

## 2021-11-01 NOTE — Progress Notes (Signed)
°  Progress Note   Patient: [name] [name]:[id number] DOB: 1940-12-11 DOA: 10/28/2021     4 DOS: the patient was seen and examined on 11/01/2021   Brief hospital course: [name] [name] is a 81 y.o. male with medical history significant of HTN history of lymphoma and BCC who presents by EMS after a fall at home.  Found to have a hip fracture.  S/p repair on 2/10.  Most likely will need SNF placement on 2/15 if his GI symptoms have improve.  Bed at [facility].  Assessment and Plan: * Closed right hip fracture ([place])- (present on admission) -closed right hip fracture. S/p repair on 2/10 Dilaudid for pain control ordered.  -PT/OT- SNF placement -expected post op anemia from ABL- daily labs  Hyponatremia Mild -daily labs  GERD (gastroesophageal reflux disease) Uses apple cidar vinegar at home for GERD symptoms -protonix and tums as needed -DG Esophagus normal -GI consult  Constipation Bowel regimen   Essential hypertension- (present on admission) Continue lisinopril and norvasc     Subjective: pain with eating grits  Physical Exam: Vitals:   10/31/21 1614 11/01/21 0448 11/01/21 0815 11/01/21 0938  BP: 126/65 (!) 143/78  (!) 145/74  Pulse: 81 80 75 93  Resp: 16 18  17   Temp: 98.3 F (36.8 C) 98 F (36.7 C)  98.5 F (36.9 C)  TempSrc: Oral Oral  Oral  SpO2: 94% 95% 99% 99%  Weight:      Height:        General: Appearance:     Overweight male in no acute distress     Lungs:     respirations unlabored  Heart:    Normal heart rate.     MS:   All extremities are intact.    Neurologic:   Awake, alert, oriented x 3.     Data Reviewed: No labs ordered  Family Communication: daughter at bedside  Disposition: Status is: Inpatient Remains inpatient appropriate because: needs SNF placement once able to tolerate diet          Planned Discharge Destination: Skilled nursing facility    Time spent: 45 minutes  Author: Geradine Girt, DO 11/01/2021  12:30 PM  For on call review [email or web address].

## 2021-11-01 NOTE — Progress Notes (Signed)
Occupational Therapy Treatment Patient Details Name: [name] [name] MRN: [id number] DOB: 11/05/1940 Today's Date: 11/01/2021   History of present illness Pt is an 81 y/o male who presents s/p mechanical fall at home. He was found to have a periprosthetic intertrochanteric femur fracture and is now s/p cephalomedullary nailing on 10/28/2021. He is WBAT through the RLE. PMH significant for HTN, malignant neoplasm of pancreas, other malignant lymphomas, L femuir fracture s/p IM nail 09/2019.   OT comments  Pt progressing towards goals, session focused on toilet transfer. Pt min A for bed mobility and transfers, overall needing supervision for pericare sitting on commode. Pt benefits from elevated toilet seat/BSC over commode with RLE slightly elevated. Pt presenting with impairments listed below, will follow acutely. Continue to recommend SNF at d/c.   Recommendations for follow up therapy are one component of a multi-disciplinary discharge planning process, led by the attending physician.  Recommendations may be updated based on patient status, additional functional criteria and insurance authorization.    Follow Up Recommendations  Skilled nursing-short term rehab (<3 hours/day)    Assistance Recommended at Discharge Intermittent Supervision/Assistance  Patient can return home with the following  A little help with walking and/or transfers;A lot of help with bathing/dressing/bathroom;Assistance with cooking/housework;Assist for transportation;Help with stairs or ramp for entrance   Equipment Recommendations  None recommended by OT;Other (comment) (defer to next venue of care)    Recommendations for Other Services PT consult    Precautions / Restrictions Precautions Precautions: Fall Restrictions Weight Bearing Restrictions: Yes RLE Weight Bearing: Weight bearing as tolerated       Mobility Bed Mobility Overal bed mobility: Needs Assistance Bed Mobility: Sit to Supine     Supine  to sit: Min assist     General bed mobility comments: min A to bring RLE to ground/off of bed    Transfers Overall transfer level: Needs assistance Equipment used: Rolling walker (2 wheels) Transfers: Sit to/from Stand, Bed to chair/wheelchair/BSC Sit to Stand: Min assist           General transfer comment: increased time needed, increased pain when brining hand onto walker from seated surface     Balance Overall balance assessment: Needs assistance Sitting-balance support: Feet supported, No upper extremity supported Sitting balance-Leahy Scale: Good     Standing balance support: Bilateral upper extremity supported, Reliant on assistive device for balance Standing balance-Leahy Scale: Poor                             ADL either performed or assessed with clinical judgement   ADL                       Lower Body Dressing: Moderate assistance;Sitting/lateral leans Lower Body Dressing Details (indicate cue type and reason): able to doff L sock in long sitting, decreased RLE mobility to perfrom on R side Toilet Transfer: Minimal assistance;Min guard;Rolling walker (2 wheels);Ambulation;BSC/3in1 [employer] forces technical officer Details (indicate cue type and reason): completed with BSC over toilet Toileting- Clothing Manipulation and Hygiene: Supervision/safety;Sitting/lateral lean Toileting - Clothing Manipulation Details (indicate cue type and reason): completes pericare in sitting     Functional mobility during ADLs: Min guard;Minimal assistance;Rolling walker (2 wheels)      Extremity/Trunk Assessment Upper Extremity Assessment Upper Extremity Assessment: Overall WFL for tasks assessed   Lower Extremity Assessment Lower Extremity Assessment: Defer to PT evaluation        Vision   Vision  Assessment?: No apparent visual deficits   Perception Perception Perception: Not tested   Praxis Praxis Praxis: Not tested    Cognition Arousal/Alertness:  Awake/alert Behavior During Therapy: WFL for tasks assessed/performed Overall Cognitive Status: Within Functional Limits for tasks assessed                                          Exercises      Shoulder Instructions       General Comments VSS on RA, pt's daughter in room at end of session/during session    Pertinent Vitals/ Pain       Pain Assessment Pain Assessment: Faces Pain Score: 6  Faces Pain Scale: Hurts even more Pain Location: R hip Pain Descriptors / Indicators: Operative site guarding, Moaning, Grimacing, Sore Pain Intervention(s): Limited activity within patient's tolerance, Monitored during session, Repositioned, Premedicated before session  Home Living                                          Prior Functioning/Environment              Frequency  Min 2X/week        Progress Toward Goals  OT Goals(current goals can now be found in the care plan section)  Progress towards OT goals: Progressing toward goals  Acute Rehab OT Goals Patient Stated Goal: none stated OT Goal Formulation: With patient Time For Goal Achievement: 11/12/21 Potential to Achieve Goals: Good ADL Goals Pt Will Perform Upper Body Dressing: with supervision;sitting Pt Will Perform Lower Body Dressing: with mod assist;sitting/lateral leans;sit to/from stand Pt Will Transfer to Toilet: with min guard assist;bedside commode;squat pivot transfer  Plan Discharge plan remains appropriate;Frequency remains appropriate    Co-evaluation                 AM-PAC OT "6 Clicks" Daily Activity     Outcome Measure   Help from another person eating meals?: None Help from another person taking care of personal grooming?: A Little Help from another person toileting, which includes using toliet, bedpan, or urinal?: A Lot Help from another person bathing (including washing, rinsing, drying)?: A Lot Help from another person to put on and taking off  regular upper body clothing?: A Lot Help from another person to put on and taking off regular lower body clothing?: A Lot 6 Click Score: 15    End of Session Equipment Utilized During Treatment: Gait belt;Rolling walker (2 wheels)  OT Visit Diagnosis: Unsteadiness on feet (R26.81);Other abnormalities of gait and mobility (R26.89);Muscle weakness (generalized) (M62.81)   Activity Tolerance Patient tolerated treatment well   Patient Left in chair;with call bell/phone within reach;with chair alarm set;with family/visitor present   Nurse Communication Mobility status        Time: 6734-1937 OT Time Calculation (min): 28 min  Charges: OT General Charges $OT Visit: 1 Visit OT Treatments $Self Care/Home Management : 23-37 mins  Lynnda Child, OTD, OTR/L Acute Rehab [phone]) [phone] - [place] 11/01/2021, 12:37 PM

## 2021-11-01 NOTE — TOC Progression Note (Addendum)
Transition of Care [facility]) - Progression Note    Patient Details  Name: [name] [name] MRN: [id number] Date of Birth: 05/08/1941  [facility] CM/SW Contact  Joanne Chars, LCSW Phone Number: 11/01/2021, 10:03 AM  Clinical Narrative: CSW presented bed offers to daughter and pt and they chose [facility].  CSW waiting on confirmation from [facility] that they can accept and will initiate auth.     1020: [facility] confirms and will initiate auth.  Expected Discharge Plan: [place] Barriers to Discharge: Continued Medical Work up, Ship broker, SNF Pending bed offer  Expected Discharge Plan and Services Expected Discharge Plan: [place] In-house Referral: Clinical Social Work     Living arrangements for the past 2 months: Single Family Home                                       Social Determinants of Health (SDOH) Interventions    Readmission Risk Interventions No flowsheet data found.

## 2021-11-01 NOTE — Plan of Care (Signed)
  Problem: Education: Goal: Knowledge of General Education information will improve Description Including pain rating scale, medication(s)/side effects and non-pharmacologic comfort measures Outcome: Progressing   Problem: Health Behavior/Discharge Planning: Goal: Ability to manage health-related needs will improve Outcome: Progressing   

## 2021-11-01 NOTE — Progress Notes (Signed)
Physical Therapy Treatment Patient Details Name: [name] [name] MRN: [id number] DOB: [birth year] Today's Date: 11/01/2021   History of Present Illness Pt is an 81 y/o male who presents s/p mechanical fall at home. He was found to have a periprosthetic intertrochanteric femur fracture and is now s/p cephalomedullary nailing on 10/28/2021. He is WBAT through the RLE. PMH significant for HTN, malignant neoplasm of pancreas, other malignant lymphomas, L femuir fracture s/p IM nail 09/2019.    PT Comments    Pt received supine and agreeable, pleasant, and motivated for session with good tolerance for functional mobility. Pt requiring up to mod assist for transfers for powering up an steadying balance and min assist for sequencing and safety during ambulation. LE TE focused on ROM with good tolerance. Continue to progress ambulation tolerance. Pt continues to benefit from skilled PT services to progress toward functional mobility goals.    Recommendations for follow up therapy are one component of a multi-disciplinary discharge planning process, led by the attending physician.  Recommendations may be updated based on patient status, additional functional criteria and insurance authorization.  Follow Up Recommendations  Skilled nursing-short term rehab (<3 hours/day)     Assistance Recommended at Discharge Frequent or constant Supervision/Assistance  Patient can return home with the following A lot of help with walking and/or transfers;Assist for transportation;Help with stairs or ramp for entrance   Equipment Recommendations  Rolling walker (2 wheels)    Recommendations for Other Services       Precautions / Restrictions Precautions Precautions: Fall Restrictions Weight Bearing Restrictions: Yes RLE Weight Bearing: Weight bearing as tolerated     Mobility  Bed Mobility Overal bed mobility: Needs Assistance Bed Mobility: Sit to Supine     Supine to sit: Min assist     General bed  mobility comments: min A to bring RLE to ground/off EOB    Transfers Overall transfer level: Needs assistance Equipment used: Rolling walker (2 wheels) Transfers: Sit to/from Stand, Bed to chair/wheelchair/BSC Sit to Stand: Mod assist           General transfer comment: increased time needed, assist needed for power up and steadying as pt brings hand from bed to RW    Ambulation/Gait Ambulation/Gait assistance: Min guard Gait Distance (Feet): 15 Feet Assistive device: Rolling walker (2 wheels) Gait Pattern/deviations: Step-to pattern, Decreased step length - right, Decreased step length - left, Antalgic, Trunk flexed Gait velocity: Decreased     General Gait Details: VC's for improved posture, sequencing and safety with the RW for support.   Stairs             Wheelchair Mobility    Modified Rankin (Stroke Patients Only)       Balance Overall balance assessment: Needs assistance Sitting-balance support: Feet supported, No upper extremity supported Sitting balance-Leahy Scale: Good     Standing balance support: Bilateral upper extremity supported, Reliant on assistive device for balance Standing balance-Leahy Scale: Poor                              Cognition Arousal/Alertness: Awake/alert Behavior During Therapy: WFL for tasks assessed/performed Overall Cognitive Status: Within Functional Limits for tasks assessed                                          Exercises General Exercises - Lower Extremity  Ankle Circles/Pumps: AROM, Both, 10 reps Heel Slides: AROM, AAROM, Both, 10 reps, Supine Hip ABduction/ADduction: AROM, Right, 10 reps, Supine Hip Flexion/Marching: AROM, Both, 20 reps, Seated    General Comments General comments (skin integrity, edema, etc.): VSS on RA      Pertinent Vitals/Pain Pain Assessment Pain Assessment: Faces Faces Pain Scale: Hurts little more Pain Location: R hip Pain Descriptors /  Indicators: Operative site guarding, Moaning, Grimacing, Sore Pain Intervention(s): Limited activity within patient's tolerance, Monitored during session    Home Living                          Prior Function            PT Goals (current goals can now be found in the care plan section) Acute Rehab PT Goals PT Goal Formulation: With patient/family Time For Goal Achievement: 11/12/21    Frequency    Min 3X/week      PT Plan Current plan remains appropriate    Co-evaluation              AM-PAC PT "6 Clicks" Mobility   Outcome Measure  Help needed turning from your back to your side while in a flat bed without using bedrails?: A Little Help needed moving from lying on your back to sitting on the side of a flat bed without using bedrails?: A Little Help needed moving to and from a bed to a chair (including a wheelchair)?: A Little Help needed standing up from a chair using your arms (e.g., wheelchair or bedside chair)?: A Little Help needed to walk in hospital room?: A Lot Help needed climbing 3-5 steps with a railing? : Total 6 Click Score: 15    End of Session Equipment Utilized During Treatment: Gait belt Activity Tolerance: Patient tolerated treatment well Patient left: in bed;with call bell/phone within reach;with bed alarm set   PT Visit Diagnosis: Unsteadiness on feet (R26.81);Pain Pain - Right/Left: Right Pain - part of body: Hip     Time: 0539-7673 PT Time Calculation (min) (ACUTE ONLY): 20 min  Charges:  $Gait Training: 8-22 mins                     Audry Riles. PTA Acute Rehabilitation Services Office: [place] 11/01/2021, 3:55 PM

## 2021-11-01 NOTE — Progress Notes (Addendum)
Patient ID: [name] [name], male   DOB: 06-Nov-1940, 81 y.o.   MRN: [id number]     Attending physician's note   I have taken a history, reviewed the chart, and examined the patient. I performed a substantive portion of this encounter, including complete performance of at least one of the key components, in conjunction with the APP. I agree with the APP's note, impression, and recommendations with my edits.   Symptoms overall improving.  Was able to have grits and [place] ice today.  Still with some odynophagia pointing at subxiphoid, but overall improved.  Has not yet had viscous lidocaine, but planning to have that prior to generous evening.  - Continue IV PPI bid for now.  Plan to convert to Protonix 40 mg bid when tolerating p.o. better - Continue Carafate - Continue viscous lidocaine prior to meals - No plan for EGD at this time given overall improvement - GI service can sign off at this time.  Please do not hesitate to re-contact with additional questions or concerns, or symptoms not continuing to improve  Las Flores, DO, FACG [phone] office          Progress Note   Subjective   Day # 4 CC; hip fracture, dysphagia  Sitting up in the chair, says he is doing fine from the fracture standpoint, daughter at bedside Some improvement in dysphagia, he was able to get grits down this morning, and says that it did not hurt with each swallow but still hurting intermittently with swallowing. He has not been given the viscous lidocaine as yet  Last bowel movement Saturday, would like a suppository which he says worked great     Objective   Vital signs in last 24 hours: Temp:  [98 F (36.7 C)-98.5 F (36.9 C)] 98.5 F (36.9 C) (02/14 0938) Pulse Rate:  [75-93] 93 (02/14 0938) Resp:  [16-18] 17 (02/14 0938) BP: (126-145)/(65-78) 145/74 (02/14 0938) SpO2:  [94 %-99 %] 99 % (02/14 0938) Last BM Date : 10/29/21 General: Elderly white male in NAD Heart:  Regular rate and  rhythm; no murmurs Lungs: Respirations even and unlabored, lungs CTA bilaterally Abdomen:  Soft, nontender and nondistended. Normal bowel sounds.  Neurologic:  Alert and oriented,  grossly normal neurologically. Psych:  Cooperative. Normal mood and affect.  Intake/Output from previous day: 02/13 0701 - 02/14 0700 In: 10 [I.V.:10] Out: 100 [Urine:100] Intake/Output this shift: Total I/O In: -  Out: 1100 [Urine:1100]  Lab Results: Recent Labs    10/30/21 0406 10/31/21 0414  WBC 13.0* 8.2  HGB 8.9* 8.8*  HCT 27.4* 26.7*  PLT 110* 123*   BMET Recent Labs    10/30/21 0406 10/31/21 0414  NA 136 136  K 4.1 3.7  CL 102 102  CO2 28 30  GLUCOSE 131* 95  BUN 18 14  CREATININE 1.07 0.93  CALCIUM 8.1* 7.8*   LFT No results for input(s): PROT, ALBUMIN, AST, ALT, ALKPHOS, BILITOT, BILIDIR, IBILI in the last 72 hours. PT/INR No results for input(s): LABPROT, INR in the last 72 hours.  Studies/Results: DG ESOPHAGUS W SINGLE CM (SOL OR THIN BA)  Result Date: 10/31/2021 CLINICAL DATA:  Dysphagia. EXAM: ESOPHAGUS/BARIUM SWALLOW/TABLET STUDY TECHNIQUE: Single contrast examination was performed using thin liquid barium. This exam was performed by [name], and was supervised and interpreted by Dr. Nyoka Cowden. FLUOROSCOPY TIME:  Radiation Exposure Index (as provided by the fluoroscopic device): 4.5 mGy. COMPARISON:  NONE. FINDINGS: Swallowing: Appears normal. No vestibular penetration or aspiration  seen. Pharynx: Unremarkable. Esophagus: Normal appearance. Esophageal motility: Within normal limits. Hiatal Hernia: None. Gastroesophageal reflux: None visualized. Ingested 81mm barium tablet: Passed normally. Other: None. IMPRESSION: No definite abnormality seen in the esophagus. Electronically Signed   By: Marijo Conception M.D.   On: 10/31/2021 09:21       Assessment / Plan:    #21 81 year old white male with acute odynophagia postop right hip fracture 10/28/2021 Patient has some  intermittent GERD symptoms at home which he treats with OTC remedies  Suspect acute reflux esophagitis, possible pill induced esophagitis, less likely infectious esophagitis  He has had some mild improvement in symptoms since yesterday  #2 stable status post right hip fracture and repair #3 acute blood loss anemia postop -stable  Plan; continue full liquid diet and supplements Continue IV PPI twice daily Continue Carafate suspension 4 times daily between meals and bedtime Viscous lidocaine 15 to 20 minutes AC meals I do not think he is going to require EGD, will reassess in a.m., hopefully will have continued gradual improvement in symptoms     Principal Problem:   Closed right hip fracture (HCC) Active Problems:   Essential hypertension   Constipation   GERD (gastroesophageal reflux disease)   Hyponatremia   Odynophagia     LOS: 4 days   Amy Esterwood PA-C 11/01/2021, 11:13 AM

## 2021-11-02 DIAGNOSIS — R1310 Dysphagia, unspecified: Secondary | ICD-10-CM | POA: Diagnosis not present

## 2021-11-02 DIAGNOSIS — D62 Acute posthemorrhagic anemia: Secondary | ICD-10-CM | POA: Diagnosis not present

## 2021-11-02 DIAGNOSIS — K5900 Constipation, unspecified: Secondary | ICD-10-CM | POA: Diagnosis not present

## 2021-11-02 LAB — CBC
HCT: 29.9 % — ABNORMAL LOW (ref 39.0–52.0)
Hemoglobin: 9.6 g/dL — ABNORMAL LOW (ref 13.0–17.0)
MCH: 28.3 pg (ref 26.0–34.0)
MCHC: 32.1 g/dL (ref 30.0–36.0)
MCV: 88.2 fL (ref 80.0–100.0)
Platelets: 207 10*3/uL (ref 150–400)
RBC: 3.39 MIL/uL — ABNORMAL LOW (ref 4.22–5.81)
RDW: 14.3 % (ref 11.5–15.5)
WBC: 7.8 10*3/uL (ref 4.0–10.5)
nRBC: 0 % (ref 0.0–0.2)

## 2021-11-02 MED ORDER — SODIUM CHLORIDE 0.45 % IV SOLN: INTRAVENOUS | Status: AC

## 2021-11-02 MED ORDER — POLYETHYLENE GLYCOL 3350 17 G PO PACK
17.0000 g | PACK | Freq: Two times a day (BID) | ORAL | Status: DC
  Administered 2021-11-02 – 2021-11-04 (×3): 17 g via ORAL
  Filled 2021-11-02 (×5): qty 1

## 2021-11-02 MED ORDER — ENOXAPARIN SODIUM 40 MG/0.4ML IJ SOSY: 40.0000 mg | PREFILLED_SYRINGE | INTRAMUSCULAR | Status: DC

## 2021-11-02 MED ORDER — SENNOSIDES-DOCUSATE SODIUM 8.6-50 MG PO TABS
2.0000 | ORAL_TABLET | Freq: Two times a day (BID) | ORAL | Status: DC
  Administered 2021-11-02 – 2021-11-09 (×13): 2 via ORAL
  Filled 2021-11-02 (×15): qty 2

## 2021-11-02 MED ORDER — ENOXAPARIN SODIUM 40 MG/0.4ML IJ SOSY
40.0000 mg | PREFILLED_SYRINGE | INTRAMUSCULAR | Status: DC
  Administered 2021-11-04 – 2021-11-10 (×7): 40 mg via SUBCUTANEOUS
  Filled 2021-11-02 (×7): qty 0.4

## 2021-11-02 MED ORDER — LIDOCAINE VISCOUS HCL 2 % MT SOLN
15.0000 mL | Freq: Three times a day (TID) | OROMUCOSAL | Status: AC
  Administered 2021-11-02: 15 mL via OROMUCOSAL
  Filled 2021-11-02 (×3): qty 15

## 2021-11-02 NOTE — Progress Notes (Signed)
Pt. And family expressed lots of concern about the care of pt. Swallowing problem. I contacted the [facility] and attending

## 2021-11-02 NOTE — Progress Notes (Signed)
[facility] PROGRESS NOTE   [name] [name] ZWC:[id number] DOB: 1941-06-02 DOA: 10/28/2021  5 DOS: the patient was seen and examined on 11/02/2021  PCP: [name], MD  Brief History and Hospital Course:  [name] [name] is a 81 y.o. male with medical history significant of HTN history of lymphoma and BCC who presents by EMS after a fall at home.  Found to have a hip fracture.  S/p repair on 2/10.  Most likely will need SNF placement on 2/15 if his GI symptoms have improve.  Bed at [facility].  Consultants: Gastroenterology.  Orthopedics  Procedures:   Cephalomedullary nailing of right intertrochanteric femur fracture Removal of hardware right femur    Subjective: Continues to have significant discomfort with swallowing.  Not able to have anything more than liquids at this time.  Denies any chest pain.  No abdominal pain.  No nausea vomiting.    Assessment/Plan:   * Closed right hip fracture ([place])- (present on admission) Closed right hip fracture. S/p repair on 2/10. Pain seems to be reasonably well controlled.  Continue Lovenox for DVT prophylaxis   Odynophagia Continues to have significant discomfort with swallowing.  Seen by gastroenterology.  Underwent.  Esophagogram which did not show any acute findings.  Remains on PPI and Carafate.  Viscous lidocaine as needed.  Still not much improvement of symptoms.  Oral intake remains poor.  GERD (gastroesophageal reflux disease) Uses apple cidar vinegar at home for GERD symptoms.  Remains on Protonix.  See discussion under odynophagia.  Essential hypertension- (present on admission) Blood pressure reasonably well controlled.  Occasional high readings noted.  Continue amlodipine and lisinopril.  Constipation Noted to be on Colace.  Start him on Senokot and scheduled MiraLAX.    Hyponatremia -resolved  Acute postoperative anemia due to expected blood loss Drop in hemoglobin from surgery and fracture.  Continue to  monitor every so often.  Stable for the most part.    DVT Prophylaxis: Lovenox Code Status: Full code Family Communication: Discussed with patient Disposition Plan: To SNF when oral intake improves  Status is: Inpatient Remains inpatient appropriate because: Hip fracture.  Odynophagia     Medications: Scheduled:  amLODipine  5 mg Oral Daily   Chlorhexidine Gluconate Cloth  6 each Topical Daily   cholecalciferol  10,000 Units Oral Daily   docusate sodium  100 mg Oral BID   enoxaparin (LOVENOX) injection  40 mg Subcutaneous Q24H   feeding supplement  237 mL Oral BID BM   lisinopril  20 mg Oral Daily   magnesium oxide  400 mg Oral Daily   multivitamin with minerals  1 tablet Oral Daily   pantoprazole (PROTONIX) IV  40 mg Intravenous Q12H   sodium chloride flush  10-40 mL Intracatheter Q12H   sucralfate  1 g Oral TID PC,HS,0200   Continuous: MPN:TIRW & mag hydroxide-simeth, bisacodyl, calcium carbonate, famotidine, lidocaine, ondansetron **OR** ondansetron (ZOFRAN) IV, phenol, polyethylene glycol, senna-docusate, sodium chloride flush, traMADol  Antibiotics: Anti-infectives (From admission, onward)    Start     Dose/Rate Route Frequency Ordered Stop   10/28/21 2200  ceFAZolin (ANCEF) IVPB 2g/100 mL premix        2 g 200 mL/hr over 30 Minutes Intravenous Every 8 hours 10/28/21 1807 10/29/21 1316   10/28/21 1245  ceFAZolin (ANCEF) IVPB 2g/100 mL premix        2 g 200 mL/hr over 30 Minutes Intravenous On call to O.R. 10/28/21 1218 10/28/21 1501  Objective:  Vital Signs  Vitals:   11/01/21 0938 11/01/21 2139 11/01/21 2204 11/02/21 0956  BP: (!) 145/74 130/79 132/71 (!) 157/78  Pulse: 93 87 86   Resp: 17 16 16    Temp: 98.5 F (36.9 C) 98 F (36.7 C) 97.9 F (36.6 C)   TempSrc: Oral Oral Oral   SpO2: 99% 97% 94%   Weight:      Height:        Intake/Output Summary (Last 24 hours) at 11/02/2021 1120 Last data filed at 11/01/2021 1805 Gross per 24 hour   Intake --  Output 500 ml  Net -500 ml   Filed Weights   10/28/21 0052 10/28/21 1239 10/28/21 1742  Weight: 79.4 kg 79.4 kg 83.8 kg    General appearance: Awake alert.  In no distress Resp: Clear to auscultation bilaterally.  Normal effort Cardio: S1-S2 is normal regular.  No S3-S4.  No rubs murmurs or bruit GI: Abdomen is soft.  Nontender nondistended.  Bowel sounds are present normal.  No masses organomegaly Extremities: No edema.  Neurologic: Alert and oriented x3.  No focal neurological deficits.    Lab Results:  Data Reviewed: I have personally reviewed labs and imaging study reports  CBC: Recent Labs  Lab 10/28/21 0140 10/28/21 1837 10/29/21 0321 10/30/21 0406 10/31/21 0414 11/02/21 0335  WBC 10.5 17.2* 14.7* 13.0* 8.2 7.8  NEUTROABS 8.4*  --   --   --   --   --   HGB 13.6 13.0 10.9* 8.9* 8.8* 9.6*  HCT 42.6 39.0 33.4* 27.4* 26.7* 29.9*  MCV 88.9 87.2 87.9 88.4 89.6 88.2  PLT 164 147* 137* 110* 123* 299    Basic Metabolic Panel: Recent Labs  Lab 10/28/21 0140 10/28/21 1837 10/29/21 0321 10/30/21 0406 10/31/21 0414  NA 138  --  131* 136 136  K 4.0  --  4.2 4.1 3.7  CL 104  --  98 102 102  CO2 24  --  26 28 30   GLUCOSE 143*  --  192* 131* 95  BUN 20  --  18 18 14   CREATININE 1.07 1.07 1.13 1.07 0.93  CALCIUM 9.3  --  8.3* 8.1* 7.8*    GFR: Estimated Creatinine Clearance: 62.3 mL/min (by C-G formula based on SCr of 0.93 mg/dL).  Liver Function Tests: Recent Labs  Lab 10/28/21 0140  AST 26  ALT 17  ALKPHOS 63  BILITOT 1.4*  PROT 6.8  ALBUMIN 3.9     Recent Results (from the past 240 hour(s))  Resp Panel by RT-PCR (Flu A&B, Covid) Nasopharyngeal Swab     Status: None   Collection Time: 10/28/21  1:20 AM   Specimen: Nasopharyngeal Swab; Nasopharyngeal(NP) swabs in vial transport medium  Result Value Ref Range Status   SARS Coronavirus 2 by RT PCR NEGATIVE NEGATIVE Final    Comment: (NOTE) SARS-CoV-2 target nucleic acids are NOT  DETECTED.  The SARS-CoV-2 RNA is generally detectable in upper respiratory specimens during the acute phase of infection. The lowest concentration of SARS-CoV-2 viral copies this assay can detect is 138 copies/mL. A negative result does not preclude SARS-Cov-2 infection and should not be used as the sole basis for treatment or other patient management decisions. A negative result may occur with  improper specimen collection/handling, submission of specimen other than nasopharyngeal swab, presence of viral mutation(s) within the areas targeted by this assay, and inadequate number of viral copies(<138 copies/mL). A negative result must be combined with clinical observations, patient history, and epidemiological  information. The expected result is Negative.  Fact Sheet for Patients:  EntrepreneurPulse.com.au  Fact Sheet for Healthcare Providers:  IncredibleEmployment.be  This test is no t yet approved or cleared by the [place] FDA and  has been authorized for detection and/or diagnosis of SARS-CoV-2 by FDA under an Emergency Use Authorization (EUA). This EUA will remain  in effect (meaning this test can be used) for the duration of the COVID-19 declaration under Section 564(b)(1) of the Act, 21 U.S.C.section 360bbb-3(b)(1), unless the authorization is terminated  or revoked sooner.       Influenza A by PCR NEGATIVE NEGATIVE Final   Influenza B by PCR NEGATIVE NEGATIVE Final    Comment: (NOTE) The Xpert Xpress SARS-CoV-2/FLU/RSV plus assay is intended as an aid in the diagnosis of influenza from Nasopharyngeal swab specimens and should not be used as a sole basis for treatment. Nasal washings and aspirates are unacceptable for Xpert Xpress SARS-CoV-2/FLU/RSV testing.  Fact Sheet for Patients: EntrepreneurPulse.com.au  Fact Sheet for Healthcare Providers: IncredibleEmployment.be  This test is not yet  approved or cleared by the [place] FDA and has been authorized for detection and/or diagnosis of SARS-CoV-2 by FDA under an Emergency Use Authorization (EUA). This EUA will remain in effect (meaning this test can be used) for the duration of the COVID-19 declaration under Section 564(b)(1) of the Act, 21 U.S.C. section 360bbb-3(b)(1), unless the authorization is terminated or revoked.  Performed at [facility], [place] [address], [place], [place] [address]   Surgical PCR Screen     Status: None   Collection Time: 10/28/21  1:43 PM   Specimen: Nasal Mucosa; Nasal Swab  Result Value Ref Range Status   MRSA, PCR NEGATIVE NEGATIVE Final   Staphylococcus aureus NEGATIVE NEGATIVE Final    Comment: (NOTE) The Xpert SA Assay (FDA approved for NASAL specimens in patients 5 years of age and older), is one component of a comprehensive surveillance program. It is not intended to diagnose infection nor to guide or monitor treatment. Performed at [facility], [place] [address] [place], [place] [address]       Radiology Studies: No results found.     LOS: 5 days   Kitana Gage [employer] on [email or web address]  11/02/2021, 11:20 AM

## 2021-11-02 NOTE — Progress Notes (Signed)
Physical Therapy Treatment Patient Details Name: [name] [name] MRN: [id number] DOB: 1941-07-28 Today's Date: 11/02/2021   History of Present Illness Pt is an 81 y/o male who presents s/p mechanical fall at home. He was found to have a periprosthetic intertrochanteric femur fracture and is now s/p cephalomedullary nailing on 10/28/2021. He is WBAT through the RLE. PMH significant for HTN, malignant neoplasm of pancreas, other malignant lymphomas, L femuir fracture s/p IM nail 09/2019.    PT Comments    Pt received in supine and agreeable to session with good tolerance for gait progression with grossly min assist needed throughout. Pt with increased stamina and increased overall ambulation distance. Pt continues to require cues for sequencing of BLE and RW throughout, with ability tto correct but inability to maintain correct sequencing. Pt continues to benefit from skilled PT services to progress toward functional mobility goals.    Recommendations for follow up therapy are one component of a multi-disciplinary discharge planning process, led by the attending physician.  Recommendations may be updated based on patient status, additional functional criteria and insurance authorization.  Follow Up Recommendations  Skilled nursing-short term rehab (<3 hours/day)     Assistance Recommended at Discharge Frequent or constant Supervision/Assistance  Patient can return home with the following A lot of help with walking and/or transfers;Assist for transportation;Help with stairs or ramp for entrance   Equipment Recommendations  Rolling walker (2 wheels)    Recommendations for Other Services       Precautions / Restrictions Precautions Precautions: Fall Restrictions Weight Bearing Restrictions: Yes RLE Weight Bearing: Weight bearing as tolerated     Mobility  Bed Mobility Overal bed mobility: Needs Assistance Bed Mobility: Sit to Supine     Supine to sit: Min assist     General bed  mobility comments: min A to bring RLE to ground/off EOB at pt request to mediate increase in pain    Transfers Overall transfer level: Needs assistance Equipment used: Rolling walker (2 wheels) Transfers: Sit to/from Stand, Bed to chair/wheelchair/BSC Sit to Stand: Min assist           General transfer comment: increased time needed, assist needed for power up and steadying as pt brings hand from bed to RW    Ambulation/Gait Ambulation/Gait assistance: Min assist, Mod assist (mod assist for cueing only) Gait Distance (Feet): 25 Feet (25' x 2 bouts with seated rest break between) Assistive device: Rolling walker (2 wheels) Gait Pattern/deviations: Step-to pattern, Decreased step length - right, Decreased step length - left, Antalgic, Trunk flexed Gait velocity: Decreased     General Gait Details: VC's for improved posture, sequencing and safety with the RW for support. Chair follow for safety.   Stairs             Wheelchair Mobility    Modified Rankin (Stroke Patients Only)       Balance Overall balance assessment: Needs assistance Sitting-balance support: Feet supported, No upper extremity supported Sitting balance-Leahy Scale: Good     Standing balance support: Bilateral upper extremity supported, Reliant on assistive device for balance Standing balance-Leahy Scale: Poor                              Cognition Arousal/Alertness: Awake/alert Behavior During Therapy: WFL for tasks assessed/performed Overall Cognitive Status: Within Functional Limits for tasks assessed  Exercises      General Comments        Pertinent Vitals/Pain Pain Assessment Pain Assessment: Faces Faces Pain Scale: Hurts a little bit Pain Location: R hip Pain Descriptors / Indicators: Operative site guarding, Moaning, Grimacing, Sore    Home Living                          Prior Function             PT Goals (current goals can now be found in the care plan section) Acute Rehab PT Goals PT Goal Formulation: With patient/family Time For Goal Achievement: 11/12/21    Frequency    Min 3X/week      PT Plan Current plan remains appropriate    Co-evaluation              AM-PAC PT "6 Clicks" Mobility   Outcome Measure  Help needed turning from your back to your side while in a flat bed without using bedrails?: A Little Help needed moving from lying on your back to sitting on the side of a flat bed without using bedrails?: A Little Help needed moving to and from a bed to a chair (including a wheelchair)?: A Little Help needed standing up from a chair using your arms (e.g., wheelchair or bedside chair)?: A Little Help needed to walk in hospital room?: A Lot Help needed climbing 3-5 steps with a railing? : Total 6 Click Score: 15    End of Session Equipment Utilized During Treatment: Gait belt Activity Tolerance: Patient tolerated treatment well Patient left: in chair;with call bell/phone within reach;with nursing/sitter in room (NT present to give pt bath) Nurse Communication: Mobility status PT Visit Diagnosis: Unsteadiness on feet (R26.81);Pain Pain - Right/Left: Right Pain - part of body: Hip     Time: 6834-1962 PT Time Calculation (min) (ACUTE ONLY): 30 min  Charges:  $Gait Training: 23-37 mins                     Lateesha Bezold R. PTA Acute Rehabilitation Services Office: [place] 11/02/2021, 4:00 PM

## 2021-11-02 NOTE — Progress Notes (Addendum)
Patient ID: [name] [name], male   DOB: 20-Jan-1941, 81 y.o.   MRN: [id number]     Attending physician's note   I have taken a history, reviewed the chart, and examined the patient. I performed a substantive portion of this encounter, including complete performance of at least one of the key components, in conjunction with the APP. I agree with the APP's note, impression, and recommendations with my edits.   Continues to have odynophagia despite adequate trial of high-dose PPI, Carafate, and viscous lidocaine.  Plan to evaluate with EGD tomorrow.  The indications, risks, and benefits of EGD were explained to the patient in detail. Risks include but are not limited to bleeding, perforation, adverse reaction to medications, and cardiopulmonary compromise. Sequelae include but are not limited to the possibility of surgery, hospitalization, and mortality. The patient verbalized understanding and wished to proceed. All questions answered. Further recommendations pending results of the exam.    Nelissa Bolduc, DO, FACG [phone] office          Progress Note   Subjective   Day # 5  CC;Odynophagia  BID PPI Carafate  susp QID   Viscous lidocaine q 3 hours prn    Ba Swallow 2/13 - normal   Patient still experiencing painful swallowing, liquid diet, says he can get some bites down without pain, but most are causing discomfort which is limiting his oral intake. He says the viscous lidocaine does not seem to make it to the area of pain. No nausea vomiting He did have a bowel movement yesterday after Dulcolax suppository, very constipated stool, trying to get MiraLAX down to      Objective   Vital signs in last 24 hours: Temp:  [97.6 F (36.4 C)-98 F (36.7 C)] 97.6 F (36.4 C) (02/15 1256) Pulse Rate:  [86-93] 93 (02/15 1256) Resp:  [16-17] 17 (02/15 1256) BP: (130-157)/(70-79) 136/70 (02/15 1256) SpO2:  [94 %-97 %] 97 % (02/15 1256) Last BM Date : 11/01/21 General:    elderly WM in NAD Heart:  Regular rate and rhythm; no murmurs Lungs: Respirations even and unlabored, lungs CTA bilaterally Abdomen:  Soft, nontender and nondistended. Normal bowel sounds. Extremities:  Without edema. Neurologic:  Alert and oriented,  grossly normal neurologically. Psych:  Cooperative. Normal mood and affect.  Intake/Output from previous day: 02/14 0701 - 02/15 0700 In: -  Out: 1600 [Urine:1600] Intake/Output this shift: No intake/output data recorded.  Lab Results: Recent Labs    10/31/21 0414 11/02/21 0335  WBC 8.2 7.8  HGB 8.8* 9.6*  HCT 26.7* 29.9*  PLT 123* 207   BMET Recent Labs    10/31/21 0414  NA 136  K 3.7  CL 102  CO2 30  GLUCOSE 95  BUN 14  CREATININE 0.93  CALCIUM 7.8*   LFT No results for input(s): PROT, ALBUMIN, AST, ALT, ALKPHOS, BILITOT, BILIDIR, IBILI in the last 72 hours. PT/INR No results for input(s): LABPROT, INR in the last 72 hours.       Assessment / Plan:    # 27 81 year old white male with acute odynophagia onset postop right hip fracture 10/28/2021 Intermittent GERD symptoms at home which she has been treating with OTC remedies long-term  Barium swallow was normal Suspect acute reflux esophagitis, possible pill induced esophagitis, less likely infectious esophagitis   Patient initially had some mild improvement in symptoms but at this point is still experiencing pain with most swallows, which is limiting his oral intake.  He does not have  much appetite, remains on full liquids  #2 stable status post right hip fracture and repair 10/28/2018.  #3 acute blood loss anemia-secondary to perioperative losses  Plan; continue full liquids today Patient will be scheduled for EGD with Dr. Bryan Lemma tomorrow morning 11/03/2021.  Procedure was discussed in detail with the patient including indications risk and benefits and is agreeable to proceed Hold Lovenox in a.m. Continue IV PPI twice daily, and Carafate suspension 4  times daily as well as viscous lidocaine 15 to 20 minutes AC Further recommendations pending findings at EGD     Principal Problem:   Closed right hip fracture [facility]) Active Problems:   Essential hypertension   Constipation   GERD (gastroesophageal reflux disease)   Hyponatremia   Odynophagia   Acute postoperative anemia due to expected blood loss     LOS: 5 days   Amy Esterwood PA-C 11/02/2021, 1:30 PM

## 2021-11-02 NOTE — Assessment & Plan Note (Addendum)
Drop in hemoglobin from surgery and fracture.  Stable for the most part.  Has not required any blood transfusion

## 2021-11-02 NOTE — TOC Progression Note (Signed)
Transition of Care [facility]) - Progression Note    Patient Details  Name: [name] [name] MRN: [id number] Date of Birth: May 09, 1941  Transition of Care [facility]) CM/SW Contact  Joanne Chars, LCSW Phone Number: 11/02/2021, 1:19 PM  Clinical Narrative:   Message from [place]. Auth still pending.      Expected Discharge Plan: [place] Barriers to Discharge: Continued Medical Work up, Ship broker, SNF Pending bed offer  Expected Discharge Plan and Services Expected Discharge Plan: [place] In-house Referral: Clinical Social Work     Living arrangements for the past 2 months: Single Family Home                                       Social Determinants of Health (SDOH) Interventions    Readmission Risk Interventions No flowsheet data found.

## 2021-11-02 NOTE — Assessment & Plan Note (Addendum)
Despite PPI Carafate and viscous lidocaine patient has not noticed any significant improvement in his symptoms.   Patient was reevaluated by gastroenterology.  Underwent EGD on 2/16 which showed severe reflux esophagitis.  Likely explains his symptoms.  Abdominal x-ray and chest x-ray without acute findings.  Continue PPI twice a day, Carafate.  Pepcid as needed.  Patient and family reassured.   Patient's diet was slowly advanced.  He is tolerating his soft diet well.  He is able to consume about 50% of his meals.  He was reassured.  He was told that his symptoms are gradually improved.

## 2021-11-02 NOTE — H&P (View-Only) (Signed)
Patient ID: [name] [name], male   DOB: 1941-02-14, 81 y.o.   MRN: [id number]     Attending physician's note   I have taken a history, reviewed the chart, and examined the patient. I performed a substantive portion of this encounter, including complete performance of at least one of the key components, in conjunction with the APP. I agree with the APP's note, impression, and recommendations with my edits.   Continues to have odynophagia despite adequate trial of high-dose PPI, Carafate, and viscous lidocaine.  Plan to evaluate with EGD tomorrow.  The indications, risks, and benefits of EGD were explained to the patient in detail. Risks include but are not limited to bleeding, perforation, adverse reaction to medications, and cardiopulmonary compromise. Sequelae include but are not limited to the possibility of surgery, hospitalization, and mortality. The patient verbalized understanding and wished to proceed. All questions answered. Further recommendations pending results of the exam.    [name] Madole, DO, FACG [phone] office          Progress Note   Subjective   Day # 5  CC;Odynophagia  BID PPI Carafate  susp QID   Viscous lidocaine q 3 hours prn    Ba Swallow 2/13 - normal   Patient still experiencing painful swallowing, liquid diet, says he can get some bites down without pain, but most are causing discomfort which is limiting his oral intake. He says the viscous lidocaine does not seem to make it to the area of pain. No nausea vomiting He did have a bowel movement yesterday after Dulcolax suppository, very constipated stool, trying to get MiraLAX down to      Objective   Vital signs in last 24 hours: Temp:  [97.6 F (36.4 C)-98 F (36.7 C)] 97.6 F (36.4 C) (02/15 1256) Pulse Rate:  [86-93] 93 (02/15 1256) Resp:  [16-17] 17 (02/15 1256) BP: (130-157)/(70-79) 136/70 (02/15 1256) SpO2:  [94 %-97 %] 97 % (02/15 1256) Last BM Date : 11/01/21 General:    elderly WM in NAD Heart:  Regular rate and rhythm; no murmurs Lungs: Respirations even and unlabored, lungs CTA bilaterally Abdomen:  Soft, nontender and nondistended. Normal bowel sounds. Extremities:  Without edema. Neurologic:  Alert and oriented,  grossly normal neurologically. Psych:  Cooperative. Normal mood and affect.  Intake/Output from previous day: 02/14 0701 - 02/15 0700 In: -  Out: 1600 [Urine:1600] Intake/Output this shift: No intake/output data recorded.  Lab Results: Recent Labs    10/31/21 0414 11/02/21 0335  WBC 8.2 7.8  HGB 8.8* 9.6*  HCT 26.7* 29.9*  PLT 123* 207   BMET Recent Labs    10/31/21 0414  NA 136  K 3.7  CL 102  CO2 30  GLUCOSE 95  BUN 14  CREATININE 0.93  CALCIUM 7.8*   LFT No results for input(s): PROT, ALBUMIN, AST, ALT, ALKPHOS, BILITOT, BILIDIR, IBILI in the last 72 hours. PT/INR No results for input(s): LABPROT, INR in the last 72 hours.       Assessment / Plan:    # 37 81 year old white male with acute odynophagia onset postop right hip fracture 10/28/2021 Intermittent GERD symptoms at home which she has been treating with OTC remedies long-term  Barium swallow was normal Suspect acute reflux esophagitis, possible pill induced esophagitis, less likely infectious esophagitis   Patient initially had some mild improvement in symptoms but at this point is still experiencing pain with most swallows, which is limiting his oral intake.  He does not have  much appetite, remains on full liquids  #2 stable status post right hip fracture and repair 10/28/2018.  #3 acute blood loss anemia-secondary to perioperative losses  Plan; continue full liquids today Patient will be scheduled for EGD with Dr. Bryan [name] tomorrow morning 11/03/2021.  Procedure was discussed in detail with the patient including indications risk and benefits and is agreeable to proceed Hold Lovenox in a.m. Continue IV PPI twice daily, and Carafate suspension 4  times daily as well as viscous lidocaine 15 to 20 minutes AC Further recommendations pending findings at EGD     Principal Problem:   Closed right hip fracture [facility]) Active Problems:   Essential hypertension   Constipation   GERD (gastroesophageal reflux disease)   Hyponatremia   Odynophagia   Acute postoperative anemia due to expected blood loss     LOS: 5 days   [name] Esterwood PA-C 11/02/2021, 1:30 PM

## 2021-11-03 ENCOUNTER — Inpatient Hospital Stay (HOSPITAL_COMMUNITY): Payer: Medicare HMO | Admitting: Anesthesiology

## 2021-11-03 ENCOUNTER — Inpatient Hospital Stay (HOSPITAL_COMMUNITY): Payer: Medicare HMO

## 2021-11-03 ENCOUNTER — Encounter (HOSPITAL_COMMUNITY): Payer: Self-pay | Admitting: Family Medicine

## 2021-11-03 ENCOUNTER — Telehealth: Payer: Self-pay | Admitting: Gastroenterology

## 2021-11-03 ENCOUNTER — Encounter (HOSPITAL_COMMUNITY)
Admission: EM | Disposition: A | Discharge status: Skilled Nursing Facility | Payer: Self-pay | Source: Home / Self Care | Attending: Internal Medicine

## 2021-11-03 DIAGNOSIS — I10 Essential (primary) hypertension: Secondary | ICD-10-CM

## 2021-11-03 DIAGNOSIS — K2100 Gastro-esophageal reflux disease with esophagitis, without bleeding: Secondary | ICD-10-CM

## 2021-11-03 DIAGNOSIS — M1990 Unspecified osteoarthritis, unspecified site: Secondary | ICD-10-CM

## 2021-11-03 DIAGNOSIS — C8590 Non-Hodgkin lymphoma, unspecified, unspecified site: Secondary | ICD-10-CM

## 2021-11-03 DIAGNOSIS — R1310 Dysphagia, unspecified: Secondary | ICD-10-CM | POA: Diagnosis not present

## 2021-11-03 HISTORY — PX: BIOPSY: SHX5522

## 2021-11-03 HISTORY — PX: ESOPHAGOGASTRODUODENOSCOPY (EGD) WITH PROPOFOL: SHX5813

## 2021-11-03 SURGERY — ESOPHAGOGASTRODUODENOSCOPY (EGD) WITH PROPOFOL
Anesthesia: Monitor Anesthesia Care

## 2021-11-03 MED ORDER — PROPOFOL 500 MG/50ML IV EMUL
INTRAVENOUS | Status: DC | PRN
  Administered 2021-11-03: 50 ug/kg/min via INTRAVENOUS

## 2021-11-03 MED ORDER — LACTATED RINGERS IV SOLN
INTRAVENOUS | Status: AC | PRN
  Administered 2021-11-03: 10 mL/h via INTRAVENOUS

## 2021-11-03 MED ORDER — PROPOFOL 10 MG/ML IV BOLUS
INTRAVENOUS | Status: DC | PRN
Start: 2021-11-03 — End: 2021-11-03
  Administered 2021-11-03: 30 mg via INTRAVENOUS
  Administered 2021-11-03: 50 mg via INTRAVENOUS

## 2021-11-03 MED ORDER — SODIUM CHLORIDE 0.45 % IV SOLN: INTRAVENOUS | Status: AC

## 2021-11-03 MED ORDER — PHENYLEPHRINE HCL-NACL 20-0.9 MG/250ML-% IV SOLN
INTRAVENOUS | Status: DC | PRN
  Administered 2021-11-03: 20 ug/min via INTRAVENOUS

## 2021-11-03 MED ORDER — SUCRALFATE 1 GM/10ML PO SUSP
2.0000 g | Freq: Three times a day (TID) | ORAL | Status: DC
  Administered 2021-11-03 – 2021-11-10 (×31): 2 g via ORAL
  Filled 2021-11-03 (×31): qty 20

## 2021-11-03 MED ORDER — FLEET ENEMA 7-19 GM/118ML RE ENEM
1.0000 | ENEMA | Freq: Every day | RECTAL | Status: DC | PRN
  Administered 2021-11-06: 1 via RECTAL
  Filled 2021-11-03: qty 1

## 2021-11-03 SURGICAL SUPPLY — 15 items

## 2021-11-03 NOTE — Telephone Encounter (Signed)
Left message for pt's daughter to call back.

## 2021-11-03 NOTE — Telephone Encounter (Signed)
Patient had EGD at the hospital with Dr. Bryan Lemma this morning.  Daughter called the hospital asking if there could be some type of pain medication prescribed for patient because he is in so much pain he cannot eat.  She has had no response from the hospital.  "Even water gives him stabbing, sharp pains."  She states he has been unable to eat for the past 4 days and he needs to eat.  Please call patient's daughter to advise if this can be done.  Thank you.

## 2021-11-03 NOTE — Plan of Care (Signed)

## 2021-11-03 NOTE — Transfer of Care (Signed)
Immediate Anesthesia Transfer of Care Note  Patient: [name] [name]  Procedure(s) Performed: ESOPHAGOGASTRODUODENOSCOPY (EGD) WITH PROPOFOL BIOPSY  Patient Location: PACU  Anesthesia Type:MAC  Level of Consciousness: awake and drowsy  Airway & Oxygen Therapy: Patient Spontanous Breathing  Post-op Assessment: Report given to RN and Post -op Vital signs reviewed and stable  Post vital signs: Reviewed and stable  Last Vitals:  Vitals Value Taken Time  BP    Temp    Pulse 83 11/03/21 1033  Resp 12 11/03/21 1033  SpO2 97 % 11/03/21 1033  Vitals shown include unvalidated device data.  Last Pain:  Vitals:   11/03/21 0903  TempSrc: Temporal  PainSc: 3       Patients Stated Pain Goal: 3 (41/14/64 3142)  Complications: No notable events documented.

## 2021-11-03 NOTE — Progress Notes (Signed)
Mobility Specialist Progress Note    11/03/21 1713  Mobility  Activity Ambulated with assistance in room  Level of Assistance Minimal assist, patient does 75% or more  Assistive Device Front wheel walker  RLE Weight Bearing WBAT  Distance Ambulated (ft) 60 ft  Activity Response Tolerated fair  $Mobility charge 1 Mobility   Pt received in bed and agreeable. Took a rest in chair then needed to transfer back to bed for scan. Left with techs present.   [facility] Mobility Specialist  M.S. 5N: [phone]

## 2021-11-03 NOTE — Op Note (Signed)
[facility] Patient Name: [name] [name] Procedure Date : 11/03/2021 MRN: [id number] Attending MD: Gerrit Heck , MD Date of Birth: 08-13-1941 CSN: [id number] Age: 81 Admit Type: Inpatient Procedure:                Upper GI endoscopy Indications:              Odynophagia, suspected GERD Providers:                Gerrit Heck, MD, Allayne Gitelman, RN, Benetta Spar, Technician Referring MD:              Medicines:                Monitored Anesthesia Care Complications:            No immediate complications. Estimated Blood Loss:     Estimated blood loss was minimal. Procedure:                Pre-Anesthesia Assessment:                           - Prior to the procedure, a History and Physical                            was performed, and patient medications and                            allergies were reviewed. [name] tolerance of                            previous anesthesia was also reviewed. The risks                            and benefits of the procedure and the sedation                            options and risks were discussed with the patient.                            All questions were answered, and informed consent                            was obtained. Prior Anticoagulants: The patient has                            taken no previous anticoagulant or antiplatelet                            agents. ASA Grade Assessment: III - A patient with                            severe systemic disease. After reviewing the risks  and benefits, the patient was deemed in                            satisfactory condition to undergo the procedure.                           After obtaining informed consent, the endoscope was                            passed under direct vision. Throughout the                            procedure, the patient's blood pressure, pulse, and                            oxygen  saturations were monitored continuously. The                            [id number]-[id number] ([id number]) Olympus endoscope was introduced                            through the mouth, and advanced to the second part                            of duodenum. The upper GI endoscopy was                            accomplished without difficulty. The patient                            tolerated the procedure well. Scope In: Scope Out: Findings:      LA Grade D (one or more mucosal breaks involving at least 75% of       esophageal circumference) esophagitis with no bleeding was found in the       lower third of the esophagus. Biopsies were taken with a cold forceps       for histology. Estimated blood loss was minimal.      The entire examined stomach was normal.      The examined duodenum was normal. Impression:               - LA Grade D reflux esophagitis with no bleeding.                            Biopsied.                           - Normal stomach.                           - Normal examined duodenum. Recommendation:           - Return patient to hospital ward for ongoing care.                           - Full liquid diet and continue to slowly advance  as tolerated.                           - Use Protonix (pantoprazole) 40 mg PO BID for 8                            weeks.                           - Use sucralfate suspension 1 gram PO QID for 4                            weeks.                           - Continue viscous Lidocaine AC and HS until                            symptoms start to improve and able to increase diet.                           - Await pathology results.                           - Repeat upper endoscopy in 8 weeks to check                            healing.                           - Return to GI office at appointment to be                            scheduled.                           - Please do not hesitate to contact the inpatient                             GI team with additional questions or concerns. Procedure Code(s):        --- Professional ---                           [phone], Esophagogastroduodenoscopy, flexible,                            transoral; with biopsy, single or multiple Diagnosis Code(s):        --- Professional ---                           K21.00, Gastro-esophageal reflux disease with                            esophagitis, without bleeding                           R13.10,  Dysphagia, unspecified CPT copyright 2019 American Medical Association. All rights reserved. The codes documented in this report are preliminary and upon coder review may  be revised to meet current compliance requirements. Gerrit Heck, MD 11/03/2021 10:29:00 AM Number of Addenda: 0

## 2021-11-03 NOTE — Interval H&P Note (Signed)
History and Physical Interval Note:  11/03/2021 9:51 AM  [name] [name]  has presented today for surgery, with the diagnosis of odynophagia.  The various methods of treatment have been discussed with the patient and family. After consideration of risks, benefits and other options for treatment, the patient has consented to  Procedure(s): ESOPHAGOGASTRODUODENOSCOPY (EGD) WITH PROPOFOL (N/A) as a surgical intervention.  [name] history has been reviewed, patient examined, no change in status, stable for surgery.  I have reviewed the patient's chart and labs.  Questions were answered to the patient's satisfaction.     Dominic Pea Jaleea Alesi

## 2021-11-03 NOTE — Anesthesia Preprocedure Evaluation (Signed)
Anesthesia Evaluation  Patient identified by MRN, date of birth, ID band Patient awake    Reviewed: Allergy & Precautions, NPO status , Patient's Chart, lab work & pertinent test results  History of Anesthesia Complications Negative for: history of anesthetic complications  Airway Mallampati: II  TM Distance: >3 FB Neck ROM: Full    Dental no notable dental hx. (+) Dental Advisory Given   Pulmonary neg pulmonary ROS,    Pulmonary exam normal        Cardiovascular hypertension, Pt. on medications Normal cardiovascular exam     Neuro/Psych negative neurological ROS  negative psych ROS   GI/Hepatic Neg liver ROS,  Pancreatic neoplasm    Endo/Other  negative endocrine ROS  Renal/GU negative Renal ROS     Musculoskeletal  (+) Arthritis ,   Abdominal   Peds  Hematology  (+) Blood dyscrasia, , Lymphoma   Anesthesia Other Findings    Reproductive/Obstetrics                             Anesthesia Physical  Anesthesia Plan  ASA: 3  Anesthesia Plan: MAC   Post-op Pain Management: Minimal or no pain anticipated   Induction:   PONV Risk Score and Plan: 2 and Treatment may vary due to age or medical condition and Propofol infusion  Airway Management Planned: Natural Airway and Nasal Cannula  Additional Equipment: None  Intra-op Plan:   Post-operative Plan:   Informed Consent: I have reviewed the patients History and Physical, chart, labs and discussed the procedure including the risks, benefits and alternatives for the proposed anesthesia with the patient or authorized representative who has indicated his/her understanding and acceptance.     Dental advisory given  Plan Discussed with: CRNA and Anesthesiologist  Anesthesia Plan Comments:         Anesthesia Quick Evaluation

## 2021-11-03 NOTE — Plan of Care (Signed)

## 2021-11-03 NOTE — Progress Notes (Signed)
[facility] PROGRESS NOTE   [name] [name] TJQ:[id number] DOB: [birth year] DOA: 10/28/2021  6 DOS: the patient was seen and examined on 11/03/2021  PCP: [name], MD  Brief History and Hospital Course:  [name] [name] is a 81 y.o. male with medical history significant of HTN history of lymphoma and BCC who presents by EMS after a fall at home.  Found to have a hip fracture.  S/p repair on 2/10.  Developed significant odynophagia.  Seen by gastroenterology.  Initially given conservative treatment without any improvement.  Plan is for EGD today.  Will eventually need to go to skilled nursing facility for rehabilitation.  Consultants: Gastroenterology.  Orthopedics  Procedures:   Cephalomedullary nailing of right intertrochanteric femur fracture Removal of hardware right femur    Subjective: Patient feels well but has not noticed any improvement in his painful/difficulty swallowing.  Denies any nausea vomiting.  No abdominal pain or chest pain otherwise.  No shortness of breath.     Assessment/Plan:   * Closed right hip fracture ([place])- (present on admission) Closed right hip fracture. S/p repair on 2/10. Pain seems to be reasonably well controlled.   Continue Lovenox for DVT prophylaxis   Odynophagia Despite PPI Carafate and viscous lidocaine patient has not noticed any significant improvement in his symptoms.  Reevaluated by gastroenterology yesterday.  Plan is for EGD today.    GERD (gastroesophageal reflux disease) Uses apple cidar vinegar at home for GERD symptoms.  Remains on Protonix.  See discussion under odynophagia.  Essential hypertension- (present on admission) Blood pressure reasonably well controlled.  Occasional high readings noted.  Continue amlodipine and lisinopril.  Constipation Continue bowel regimen.  Hyponatremia Resolved  Acute postoperative anemia due to expected blood loss Drop in hemoglobin from surgery and fracture.  Stable for the  most part.  Has not required any blood transfusion    DVT Prophylaxis: Lovenox Code Status: Full code Family Communication: Discussed with patient.  Discussed with daughter yesterday  Disposition Plan: SNF eventually  Status is: Inpatient Remains inpatient appropriate because: Hip fracture.  Odynophagia     Medications: Scheduled:  [MAR Hold] amLODipine  5 mg Oral Daily   [MAR Hold] Chlorhexidine Gluconate Cloth  6 each Topical Daily   [MAR Hold] cholecalciferol  10,000 Units Oral Daily   [MAR Hold] enoxaparin (LOVENOX) injection  40 mg Subcutaneous Q24H   [MAR Hold] feeding supplement  237 mL Oral BID BM   lidocaine  15 mL Mouth/Throat TID   [MAR Hold] lisinopril  20 mg Oral Daily   [MAR Hold] magnesium oxide  400 mg Oral Daily   [MAR Hold] multivitamin with minerals  1 tablet Oral Daily   [MAR Hold] pantoprazole (PROTONIX) IV  40 mg Intravenous Q12H   [MAR Hold] polyethylene glycol  17 g Oral BID   [MAR Hold] senna-docusate  2 tablet Oral BID   [MAR Hold] sodium chloride flush  10-40 mL Intracatheter Q12H   [MAR Hold] sucralfate  1 g Oral TID PC,HS,0200   Continuous:  sodium chloride 50 mL/hr at 11/02/21 1247   lactated ringers 10 mL/hr (11/03/21 0911)   PRN:[MAR Hold] alum & mag hydroxide-simeth, [MAR Hold] bisacodyl, [MAR Hold] calcium carbonate, [MAR Hold] famotidine, lactated ringers, [MAR Hold] lidocaine, [MAR Hold] ondansetron **OR** [MAR Hold] ondansetron (ZOFRAN) IV, [MAR Hold] phenol, [MAR Hold] sodium chloride flush, [MAR Hold] traMADol  Antibiotics: Anti-infectives (From admission, onward)    Start     Dose/Rate Route Frequency Ordered Stop   10/28/21 2200  ceFAZolin (ANCEF) IVPB 2g/100 mL premix        2 g 200 mL/hr over 30 Minutes Intravenous Every 8 hours 10/28/21 1807 10/29/21 1316   10/28/21 1245  ceFAZolin (ANCEF) IVPB 2g/100 mL premix        2 g 200 mL/hr over 30 Minutes Intravenous On call to O.R. 10/28/21 1218 10/28/21 1501        Objective:  Vital Signs  Vitals:   11/02/21 1941 11/03/21 0449 11/03/21 0748 11/03/21 0903  BP: 132/71 (!) 161/75 139/70 (!) 157/73  Pulse: 85 81 78 83  Resp: 18 18 17 15   Temp: 98.3 F (36.8 C) 98.1 F (36.7 C) 98.6 F (37 C) 98.1 F (36.7 C)  TempSrc: Oral Oral Oral Temporal  SpO2: 98% 97% 98% 98%  Weight:    79.4 kg  Height:    5\' 9"  (1.753 m)    Intake/Output Summary (Last 24 hours) at 11/03/2021 0933 Last data filed at 11/02/2021 1558 Gross per 24 hour  Intake --  Output 200 ml  Net -200 ml    Filed Weights   10/28/21 1239 10/28/21 1742 11/03/21 0903  Weight: 79.4 kg 83.8 kg 79.4 kg    General appearance: Awake alert.  In no distress Resp: Clear to auscultation bilaterally.  Normal effort Cardio: S1-S2 is normal regular.  No S3-S4.  No rubs murmurs or bruit GI: Abdomen is soft.  Nontender nondistended.  Bowel sounds are present normal.  No masses organomegaly Extremities: No edema.  Neurologic: Alert and oriented x3.  No focal neurological deficits.     Lab Results:  Data Reviewed: I have personally reviewed labs and imaging study reports  CBC: Recent Labs  Lab 10/28/21 0140 10/28/21 1837 10/29/21 0321 10/30/21 0406 10/31/21 0414 11/02/21 0335  WBC 10.5 17.2* 14.7* 13.0* 8.2 7.8  NEUTROABS 8.4*  --   --   --   --   --   HGB 13.6 13.0 10.9* 8.9* 8.8* 9.6*  HCT 42.6 39.0 33.4* 27.4* 26.7* 29.9*  MCV 88.9 87.2 87.9 88.4 89.6 88.2  PLT 164 147* 137* 110* 123* 207     Basic Metabolic Panel: Recent Labs  Lab 10/28/21 0140 10/28/21 1837 10/29/21 0321 10/30/21 0406 10/31/21 0414  NA 138  --  131* 136 136  K 4.0  --  4.2 4.1 3.7  CL 104  --  98 102 102  CO2 24  --  26 28 30   GLUCOSE 143*  --  192* 131* 95  BUN 20  --  18 18 14   CREATININE 1.07 1.07 1.13 1.07 0.93  CALCIUM 9.3  --  8.3* 8.1* 7.8*     GFR: Estimated Creatinine Clearance: 62.3 mL/min (by C-G formula based on SCr of 0.93 mg/dL).  Liver Function Tests: Recent Labs   Lab 10/28/21 0140  AST 26  ALT 17  ALKPHOS 63  BILITOT 1.4*  PROT 6.8  ALBUMIN 3.9      Recent Results (from the past 240 hour(s))  Resp Panel by RT-PCR (Flu A&B, Covid) Nasopharyngeal Swab     Status: None   Collection Time: 10/28/21  1:20 AM   Specimen: Nasopharyngeal Swab; Nasopharyngeal(NP) swabs in vial transport medium  Result Value Ref Range Status   SARS Coronavirus 2 by RT PCR NEGATIVE NEGATIVE Final    Comment: (NOTE) SARS-CoV-2 target nucleic acids are NOT DETECTED.  The SARS-CoV-2 RNA is generally detectable in upper respiratory specimens during the acute phase of infection. The lowest concentration of SARS-CoV-2 viral  copies this assay can detect is 138 copies/mL. A negative result does not preclude SARS-Cov-2 infection and should not be used as the sole basis for treatment or other patient management decisions. A negative result may occur with  improper specimen collection/handling, submission of specimen other than nasopharyngeal swab, presence of viral mutation(s) within the areas targeted by this assay, and inadequate number of viral copies(<138 copies/mL). A negative result must be combined with clinical observations, patient history, and epidemiological information. The expected result is Negative.  Fact Sheet for Patients:  EntrepreneurPulse.com.au  Fact Sheet for Healthcare Providers:  IncredibleEmployment.be  This test is no t yet approved or cleared by the [place] FDA and  has been authorized for detection and/or diagnosis of SARS-CoV-2 by FDA under an Emergency Use Authorization (EUA). This EUA will remain  in effect (meaning this test can be used) for the duration of the COVID-19 declaration under Section 564(b)(1) of the Act, 21 U.S.C.section 360bbb-3(b)(1), unless the authorization is terminated  or revoked sooner.       Influenza A by PCR NEGATIVE NEGATIVE Final   Influenza B by PCR NEGATIVE  NEGATIVE Final    Comment: (NOTE) The Xpert Xpress SARS-CoV-2/FLU/RSV plus assay is intended as an aid in the diagnosis of influenza from Nasopharyngeal swab specimens and should not be used as a sole basis for treatment. Nasal washings and aspirates are unacceptable for Xpert Xpress SARS-CoV-2/FLU/RSV testing.  Fact Sheet for Patients: EntrepreneurPulse.com.au  Fact Sheet for Healthcare Providers: IncredibleEmployment.be  This test is not yet approved or cleared by the [place] FDA and has been authorized for detection and/or diagnosis of SARS-CoV-2 by FDA under an Emergency Use Authorization (EUA). This EUA will remain in effect (meaning this test can be used) for the duration of the COVID-19 declaration under Section 564(b)(1) of the Act, 21 U.S.C. section 360bbb-3(b)(1), unless the authorization is terminated or revoked.  Performed at [facility], [place] [address] [place], [place] [address]   Surgical PCR Screen     Status: None   Collection Time: 10/28/21  1:43 PM   Specimen: Nasal Mucosa; Nasal Swab  Result Value Ref Range Status   MRSA, PCR NEGATIVE NEGATIVE Final   Staphylococcus aureus NEGATIVE NEGATIVE Final    Comment: (NOTE) The Xpert SA Assay (FDA approved for NASAL specimens in patients 65 years of age and older), is one component of a comprehensive surveillance program. It is not intended to diagnose infection nor to guide or monitor treatment. Performed at [facility], [place] [address], [place], [place] [address]        Radiology Studies: No results found.     LOS: 6 days   Ashdon Gillson [employer] on [email or web address]  11/03/2021, 9:33 AM

## 2021-11-04 DIAGNOSIS — I10 Essential (primary) hypertension: Secondary | ICD-10-CM | POA: Diagnosis not present

## 2021-11-04 DIAGNOSIS — R1310 Dysphagia, unspecified: Secondary | ICD-10-CM | POA: Diagnosis not present

## 2021-11-04 LAB — CBC
HCT: 31.8 % — ABNORMAL LOW (ref 39.0–52.0)
Hemoglobin: 10.3 g/dL — ABNORMAL LOW (ref 13.0–17.0)
MCH: 28.6 pg (ref 26.0–34.0)
MCHC: 32.4 g/dL (ref 30.0–36.0)
MCV: 88.3 fL (ref 80.0–100.0)
Platelets: 271 10*3/uL (ref 150–400)
RBC: 3.6 MIL/uL — ABNORMAL LOW (ref 4.22–5.81)
RDW: 14.4 % (ref 11.5–15.5)
WBC: 9 10*3/uL (ref 4.0–10.5)
nRBC: 0 % (ref 0.0–0.2)

## 2021-11-04 LAB — BASIC METABOLIC PANEL WITH GFR
Anion gap: 7 (ref 5–15)
BUN: 12 mg/dL (ref 8–23)
CO2: 25 mmol/L (ref 22–32)
Calcium: 8.2 mg/dL — ABNORMAL LOW (ref 8.9–10.3)
Chloride: 104 mmol/L (ref 98–111)
Creatinine, Ser: 1.05 mg/dL (ref 0.61–1.24)
GFR, Estimated: >60 mL/min
Glucose, Bld: 99 mg/dL (ref 70–99)
Potassium: 3.8 mmol/L (ref 3.5–5.1)
Sodium: 136 mmol/L (ref 135–145)

## 2021-11-04 MED ORDER — ENSURE ENLIVE PO LIQD
237.0000 mL | Freq: Three times a day (TID) | ORAL | Status: DC
  Administered 2021-11-04 – 2021-11-08 (×2): 237 mL via ORAL

## 2021-11-04 MED ORDER — BISACODYL 10 MG RE SUPP
10.0000 mg | Freq: Once | RECTAL | Status: AC
Start: 2021-11-04 — End: 2021-11-04
  Administered 2021-11-04: 10 mg via RECTAL
  Filled 2021-11-04: qty 1

## 2021-11-04 MED ORDER — PROSOURCE PLUS PO LIQD
30.0000 mL | Freq: Two times a day (BID) | ORAL | Status: DC
  Administered 2021-11-05 – 2021-11-10 (×9): 30 mL via ORAL
  Filled 2021-11-04 (×10): qty 30

## 2021-11-04 NOTE — Progress Notes (Signed)
Nutrition Follow-up  DOCUMENTATION CODES:   Not applicable  INTERVENTION:  -Ensure Enlive po TID, each supplement provides 350 kcal and 20 grams of protein. -PROSource PLUS PO 4mls BID, each supplement provides 100 kcals and 15 grams of protein -MVI with minerals daily  NUTRITION DIAGNOSIS:   Increased nutrient needs related to hip fracture, post-op healing as evidenced by estimated needs.  ongoing  GOAL:   Patient will meet greater than or equal to 90% of their needs  progressing  MONITOR:   PO intake, Supplement acceptance, Labs, Weight trends  REASON FOR ASSESSMENT:   Consult Hip fracture protocol  ASSESSMENT:   81 y.o. male with medical history of HTN, lymphoma, and basal cell carcinoma of the skin, squamous cell carcinoma of the skim, malignant neoplasm of the pancreas. He presented to the ED via EMS after a fall at home and was found to have R hip fracture, s/p repair on 2/10.  Per MD, pt has developed significant odynophagia. Seen by gastroenterology and was initially given conservative treatment without any improvement. Pt subsequently underwent EGD on 2/16 which showed severe esophagitis. Pt now on full liquid diet and tolerating well. Now awaiting for oral intake to improve prior to discharging to SNF. Will provide additional oral nutrition supplements in hopes of improving intake.   UOP: 187ml x24 hours I/O: +718ml since admit  Current weight: 79.4 kg Admit weight: 79.4 kg   Medications: vitamin D3, Ensure Enlive/Plus, Mag-ox, mvi with minerals, protonix, miralax, senokot-s, carafate Labs: Recent Labs  Lab 10/30/21 0406 10/31/21 0414 11/04/21 0406  NA 136 136 136  K 4.1 3.7 3.8  CL 102 102 104  CO2 28 30 25   BUN 18 14 12   CREATININE 1.07 0.93 1.05  CALCIUM 8.1* 7.8* 8.2*  GLUCOSE 131* 95 99   Diet Order:   Diet Order             Diet full liquid Room service appropriate? Yes; Fluid consistency: Thin  Diet effective now                    EDUCATION NEEDS:   No education needs have been identified at this time  Skin:  Skin Assessment: Skin Integrity Issues: Skin Integrity Issues:: Incisions Incisions: R leg  Last BM:  2/14  Height:   Ht Readings from Last 1 Encounters:  11/03/21 5\' 9"  (1.753 m)    Weight:   Wt Readings from Last 1 Encounters:  11/03/21 79.4 kg    Ideal Body Weight:  72.7 kg  BMI:  Body mass index is 25.84 kg/m.  Estimated Nutritional Needs:   Kcal:  1800-2000 kcal  Protein:  90-105 grams  Fluid:  >/= 1.9 L/day     Theone Stanley., MS, RD, LDN (she/her/hers) RD pager number and weekend/on-call pager number located in [place].

## 2021-11-04 NOTE — Progress Notes (Signed)
Occupational Therapy Treatment Patient Details Name: [name] [name] MRN: [id number] DOB: [birth year] Today's Date: 11/04/2021   History of present illness Pt is an 81 y/o male who presents s/p mechanical fall at home. He was found to have a periprosthetic intertrochanteric femur fracture and is now s/p cephalomedullary nailing on 10/28/2021. He is WBAT through the RLE. PMH significant for HTN, malignant neoplasm of pancreas, other malignant lymphomas, L femuir fracture s/p IM nail 09/2019.   OT comments  Pt. Seen for skilled OT treatment session.  Able to perform bed mobility, LB adls, and in room ambulation with toileting task min guard a.  Pt. Motivated and eager to participate.  Reports having sock aide at home he can utilize for RLE during LB ADLS.  Current d/c recommendations remain appropriate.     Recommendations for follow up therapy are one component of a multi-disciplinary discharge planning process, led by the attending physician.  Recommendations may be updated based on patient status, additional functional criteria and insurance authorization.    Follow Up Recommendations  Skilled nursing-short term rehab (<3 hours/day)    Assistance Recommended at Discharge Intermittent Supervision/Assistance  Patient can return home with the following  A little help with walking and/or transfers;A lot of help with bathing/dressing/bathroom;Assistance with cooking/housework;Assist for transportation;Help with stairs or ramp for entrance   Equipment Recommendations  None recommended by OT;Other (comment)    Recommendations for Other Services      Precautions / Restrictions Precautions Precautions: Fall Restrictions RLE Weight Bearing: Weight bearing as tolerated       Mobility Bed Mobility Overal bed mobility: Needs Assistance Bed Mobility: Supine to Sit     Supine to sit: Min guard     General bed mobility comments: educated on using b ues to guide b les off of bed without  physical assistance required    Transfers Overall transfer level: Needs assistance Equipment used: Rolling walker (2 wheels) Transfers: Sit to/from Stand, Bed to chair/wheelchair/BSC Sit to Stand: Min assist Stand pivot transfers: Min guard   Step pivot transfers: Min guard           Balance                                           ADL either performed or assessed with clinical judgement   ADL Overall ADL's : Needs assistance/impaired                       Lower Body Dressing Details (indicate cue type and reason): able to doff L sock in long sitting, decreased RLE mobility to perfrom on R side-reports he has a sock aide at home he can find Toilet Transfer: Min guard;Ambulation;Rolling walker (2 wheels);BSC/3in1;Grab bars;Comfort height toilet Toilet Transfer Details (indicate cue type and reason): completed with BSC over toilet         Functional mobility during ADLs: Min guard;Minimal assistance;Rolling walker (2 wheels)      Extremity/Trunk Assessment              Vision       Perception     Praxis      Cognition Arousal/Alertness: Awake/alert Behavior During Therapy: WFL for tasks assessed/performed Overall Cognitive Status: Within Functional Limits for tasks assessed  Exercises      Shoulder Instructions       General Comments      Pertinent Vitals/ Pain       Pain Assessment Pain Assessment: No/denies pain  Home Living                                          Prior Functioning/Environment              Frequency  Min 2X/week        Progress Toward Goals  OT Goals(current goals can now be found in the care plan section)  Progress towards OT goals: Progressing toward goals     Plan Discharge plan remains appropriate;Frequency remains appropriate    Co-evaluation                 AM-PAC OT "6 Clicks" Daily  Activity     Outcome Measure   Help from another person eating meals?: None Help from another person taking care of personal grooming?: A Little Help from another person toileting, which includes using toliet, bedpan, or urinal?: A Lot Help from another person bathing (including washing, rinsing, drying)?: A Lot Help from another person to put on and taking off regular upper body clothing?: A Lot Help from another person to put on and taking off regular lower body clothing?: A Lot 6 Click Score: 15    End of Session Equipment Utilized During Treatment: Gait belt;Rolling walker (2 wheels)  OT Visit Diagnosis: Unsteadiness on feet (R26.81);Other abnormalities of gait and mobility (R26.89);Muscle weakness (generalized) (M62.81)   Activity Tolerance Patient tolerated treatment well   Patient Left  (left in b.room attempting BM, educated on pulling alarm sting when finished)   Nurse Communication Other (comment) (reviewed with cna pt. in b.room and will pull string when ready for assistance)        Time: 1914-7829 OT Time Calculation (min): 25 min  Charges: OT General Charges $OT Visit: 1 Visit OT Treatments $Self Care/Home Management : 23-37 mins  Sonia Baller, COTA/L Acute Rehabilitation [phone]   Tanya Nones 11/04/2021, 1:15 PM

## 2021-11-04 NOTE — Progress Notes (Signed)
[facility] PROGRESS NOTE   [name]:[id number] DOB: 1941-04-13 DOA: 10/28/2021  7 DOS: the patient was seen and examined on 11/04/2021  PCP: [name], MD  Brief History and Hospital Course:  [name] [name] is a 81 y.o. male with medical history significant of HTN history of lymphoma and BCC who presents by EMS after a fall at home.  Found to have a hip fracture.  S/p repair on 2/10.  Developed significant odynophagia.  Seen by gastroenterology.  Initially given conservative treatment without any improvement.  Patient subsequently underwent EGD on 2/16 which showed severe esophagitis.   Waiting for his oral intake to improve prior to discharge to skilled nursing facility.  Consultants: Gastroenterology.  Orthopedics  Procedures:   Cephalomedullary nailing of right intertrochanteric femur fracture Removal of hardware right femur    Subjective: Patient feels well.  Reports slight improvement in his ability to swallow.  He was reassured about his EGD findings and his imaging studies.  He was told to continue to attempt taking full liquids.    Assessment/Plan:   * Closed right hip fracture ([place])- (present on admission) Closed right hip fracture. S/p repair on 2/10. Pain seems to be reasonably well controlled.   Continue Lovenox for DVT prophylaxis   Odynophagia Despite PPI Carafate and viscous lidocaine patient has not noticed any significant improvement in his symptoms.   Patient was reevaluated by gastroenterology.  Underwent EGD on 2/16 which showed severe reflux esophagitis.  Likely explains his symptoms.  Abdominal x-ray and chest x-ray without acute findings.  Continue PPI twice a day, Carafate.  Pepcid as needed.  Patient and family reassured.  Remains on full liquid diet.   Reports slight improvement in symptoms today.   GERD (gastroesophageal reflux disease) Uses apple cidar vinegar at home for GERD symptoms.   Remains on Protonix.  See discussion  under odynophagia.  Essential hypertension- (present on admission) Blood pressure reasonably well controlled.  Occasional high readings noted.  Continue amlodipine and lisinopril.  Constipation Continue bowel regimen.  Use suppository today since he has not had a bowel movement in 3 days.  Hyponatremia Resolved  Acute postoperative anemia due to expected blood loss Drop in hemoglobin from surgery and fracture.  Stable for the most part.  Has not required any blood transfusion    DVT Prophylaxis: Lovenox Code Status: Full code Family Communication: Discussed with patient.  Discussed with daughter in detail yesterday. Disposition Plan: SNF eventually once oral intake is improved.  Status is: Inpatient Remains inpatient appropriate because: Hip fracture.  Odynophagia     Medications: Scheduled:  amLODipine  5 mg Oral Daily   Chlorhexidine Gluconate Cloth  6 each Topical Daily   cholecalciferol  10,000 Units Oral Daily   enoxaparin (LOVENOX) injection  40 mg Subcutaneous Q24H   feeding supplement  237 mL Oral BID BM   lisinopril  20 mg Oral Daily   magnesium oxide  400 mg Oral Daily   multivitamin with minerals  1 tablet Oral Daily   pantoprazole (PROTONIX) IV  40 mg Intravenous Q12H   polyethylene glycol  17 g Oral BID   senna-docusate  2 tablet Oral BID   sodium chloride flush  10-40 mL Intracatheter Q12H   sucralfate  2 g Oral TID PC,HS,0200   Continuous:  sodium chloride 50 mL/hr at 11/03/21 1727   VQM:GQQP & mag hydroxide-simeth, bisacodyl, calcium carbonate, famotidine, lidocaine, ondansetron **OR** ondansetron (ZOFRAN) IV, phenol, sodium chloride flush, sodium phosphate, traMADol  Antibiotics:  Anti-infectives (From admission, onward)    Start     Dose/Rate Route Frequency Ordered Stop   10/28/21 2200  ceFAZolin (ANCEF) IVPB 2g/100 mL premix        2 g 200 mL/hr over 30 Minutes Intravenous Every 8 hours 10/28/21 1807 10/29/21 1316   10/28/21 1245  ceFAZolin  (ANCEF) IVPB 2g/100 mL premix        2 g 200 mL/hr over 30 Minutes Intravenous On call to O.R. 10/28/21 1218 10/28/21 1501       Objective:  Vital Signs  Vitals:   11/03/21 1104 11/03/21 1513 11/03/21 2103 11/04/21 0750  BP: 127/70 131/71 120/74 (!) 145/72  Pulse: 77 81 84 77  Resp: 17 17 16 17   Temp: 98.2 F (36.8 C) 98.5 F (36.9 C) 98.7 F (37.1 C) 98.3 F (36.8 C)  TempSrc: Oral Oral Oral Oral  SpO2: 97% 95% 99% 97%  Weight:      Height:        Intake/Output Summary (Last 24 hours) at 11/04/2021 1134 Last data filed at 11/04/2021 0417 Gross per 24 hour  Intake 1171.79 ml  Output 1250 ml  Net -78.21 ml    Filed Weights   10/28/21 1239 10/28/21 1742 11/03/21 0903  Weight: 79.4 kg 83.8 kg 79.4 kg    General appearance: Awake alert.  In no distress Resp: Clear to auscultation bilaterally.  Normal effort Cardio: S1-S2 is normal regular.  No S3-S4.  No rubs murmurs or bruit GI: Abdomen is soft.  Nontender nondistended.  Bowel sounds are present normal.  No masses organomegaly Neurologic: Alert and oriented x3.  No focal neurological deficits.      Lab Results:  Data Reviewed: I have personally reviewed labs and imaging study reports  CBC: Recent Labs  Lab 10/29/21 0321 10/30/21 0406 10/31/21 0414 11/02/21 0335 11/04/21 0406  WBC 14.7* 13.0* 8.2 7.8 9.0  HGB 10.9* 8.9* 8.8* 9.6* 10.3*  HCT 33.4* 27.4* 26.7* 29.9* 31.8*  MCV 87.9 88.4 89.6 88.2 88.3  PLT 137* 110* 123* 207 271     Basic Metabolic Panel: Recent Labs  Lab 10/28/21 1837 10/29/21 0321 10/30/21 0406 10/31/21 0414 11/04/21 0406  NA  --  131* 136 136 136  K  --  4.2 4.1 3.7 3.8  CL  --  98 102 102 104  CO2  --  26 28 30 25   GLUCOSE  --  192* 131* 95 99  BUN  --  18 18 14 12   CREATININE 1.07 1.13 1.07 0.93 1.05  CALCIUM  --  8.3* 8.1* 7.8* 8.2*     GFR: Estimated Creatinine Clearance: 55.2 mL/min (by C-G formula based on SCr of 1.05 mg/dL).    Recent Results (from the  past 240 hour(s))  Resp Panel by RT-PCR (Flu A&B, Covid) Nasopharyngeal Swab     Status: None   Collection Time: 10/28/21  1:20 AM   Specimen: Nasopharyngeal Swab; Nasopharyngeal(NP) swabs in vial transport medium  Result Value Ref Range Status   SARS Coronavirus 2 by RT PCR NEGATIVE NEGATIVE Final    Comment: (NOTE) SARS-CoV-2 target nucleic acids are NOT DETECTED.  The SARS-CoV-2 RNA is generally detectable in upper respiratory specimens during the acute phase of infection. The lowest concentration of SARS-CoV-2 viral copies this assay can detect is 138 copies/mL. A negative result does not preclude SARS-Cov-2 infection and should not be used as the sole basis for treatment or other patient management decisions. A negative result may occur with  improper  specimen collection/handling, submission of specimen other than nasopharyngeal swab, presence of viral mutation(s) within the areas targeted by this assay, and inadequate number of viral copies(<138 copies/mL). A negative result must be combined with clinical observations, patient history, and epidemiological information. The expected result is Negative.  Fact Sheet for Patients:  EntrepreneurPulse.com.au  Fact Sheet for Healthcare Providers:  IncredibleEmployment.be  This test is no t yet approved or cleared by the [place] FDA and  has been authorized for detection and/or diagnosis of SARS-CoV-2 by FDA under an Emergency Use Authorization (EUA). This EUA will remain  in effect (meaning this test can be used) for the duration of the COVID-19 declaration under Section 564(b)(1) of the Act, 21 U.S.C.section 360bbb-3(b)(1), unless the authorization is terminated  or revoked sooner.       Influenza A by PCR NEGATIVE NEGATIVE Final   Influenza B by PCR NEGATIVE NEGATIVE Final    Comment: (NOTE) The Xpert Xpress SARS-CoV-2/FLU/RSV plus assay is intended as an aid in the diagnosis of  influenza from Nasopharyngeal swab specimens and should not be used as a sole basis for treatment. Nasal washings and aspirates are unacceptable for Xpert Xpress SARS-CoV-2/FLU/RSV testing.  Fact Sheet for Patients: EntrepreneurPulse.com.au  Fact Sheet for Healthcare Providers: IncredibleEmployment.be  This test is not yet approved or cleared by the [place] FDA and has been authorized for detection and/or diagnosis of SARS-CoV-2 by FDA under an Emergency Use Authorization (EUA). This EUA will remain in effect (meaning this test can be used) for the duration of the COVID-19 declaration under Section 564(b)(1) of the Act, 21 U.S.C. section 360bbb-3(b)(1), unless the authorization is terminated or revoked.  Performed at [facility], [place] [address], [place], [place] [address]   Surgical PCR Screen     Status: None   Collection Time: 10/28/21  1:43 PM   Specimen: Nasal Mucosa; Nasal Swab  Result Value Ref Range Status   MRSA, PCR NEGATIVE NEGATIVE Final   Staphylococcus aureus NEGATIVE NEGATIVE Final    Comment: (NOTE) The Xpert SA Assay (FDA approved for NASAL specimens in patients 59 years of age and older), is one component of a comprehensive surveillance program. It is not intended to diagnose infection nor to guide or monitor treatment. Performed at [facility], [place] [address], [place], [place] [address]        Radiology Studies: DG CHEST PORT 1 VIEW  Result Date: 11/03/2021 CLINICAL DATA:  Chest and abdominal pain EXAM: PORTABLE CHEST 1 VIEW COMPARISON:  10/28/2021 FINDINGS: Single frontal view of the chest demonstrates a right chest wall port via subclavian approach tip overlying the superior vena cava. Cardiac silhouette is stable. No airspace disease, effusion, or pneumothorax. No acute bony abnormalities. IMPRESSION: 1. No acute intrathoracic process. Electronically Signed   By: Randa Ngo M.D.   On: 11/03/2021  17:27   DG Abd Portable 1V  Result Date: 11/03/2021 CLINICAL DATA:  Abdominal pain EXAM: PORTABLE ABDOMEN - 1 VIEW COMPARISON:  10/28/2021 FINDINGS: Supine frontal view of the abdomen and pelvis excludes the hemidiaphragms and right flank by collimation. No bowel obstruction or ileus. Mild stool and retained contrast throughout the colon. No masses or abnormal calcifications. Postsurgical changes of the bilateral hips. IMPRESSION: 1. Unremarkable bowel gas pattern. 2. Mild fecal retention. Electronically Signed   By: Randa Ngo M.D.   On: 11/03/2021 17:27       LOS: 7 days   [facility] Pager on [email or web address]  11/04/2021,  11:34 AM

## 2021-11-04 NOTE — Progress Notes (Signed)
Mobility Specialist Progress Note    11/04/21 1055  Mobility  Bed Position Chair  Activity Ambulated with assistance in room  Level of Assistance Contact guard assist, steadying assist  Assistive Device Front wheel walker  RLE Weight Bearing WBAT  Distance Ambulated (ft) 10 ft  Activity Response Tolerated fair  $Mobility charge 1 Mobility   Pt received in BR. No complaints. Left in chair with call bell in reach.   [facility] Mobility Specialist  M.S. 5N: [phone]

## 2021-11-04 NOTE — TOC Progression Note (Signed)
Transition of Care [facility]) - Progression Note    Patient Details  Name: [name] [name] MRN: [id number] Date of Birth: 1941/02/12  [facility] CM/SW Contact  Joanne Chars, LCSW Phone Number: 11/04/2021, 1:17 PM  Clinical Narrative:   [facility] has obtained auth good 2/17-2/19.  Pt not stable for DC today, [facility] cannot take over the weekend, Josem Kaufmann will be expired Monday and will need to be resubmitted.  TOC to continue to follow.     Expected Discharge Plan: [place] Barriers to Discharge: Continued Medical Work up, Ship broker, SNF Pending bed offer  Expected Discharge Plan and Services Expected Discharge Plan: [place] In-house Referral: Clinical Social Work     Living arrangements for the past 2 months: Single Family Home                                       Social Determinants of Health (SDOH) Interventions    Readmission Risk Interventions No flowsheet data found.

## 2021-11-04 NOTE — Plan of Care (Signed)

## 2021-11-04 NOTE — Progress Notes (Signed)
Physical Therapy Treatment Patient Details Name: [name] [name] MRN: [id number] DOB: January 04, 1941 Today's Date: 11/04/2021   History of Present Illness Pt is an 81 y/o male who presents s/p mechanical fall at home. He was found to have a periprosthetic intertrochanteric femur fracture and is now s/p cephalomedullary nailing on 10/28/2021. He is WBAT through the RLE. PMH significant for HTN, malignant neoplasm of pancreas, other malignant lymphomas, L femuir fracture s/p IM nail 09/2019.    PT Comments    Pt received supine and agreeable, with light encouragement, for session. Pt continues to require min assist to power up to standing as well as for ambulation for cueing for LE/RW sequencing. Pt with decreased tolerance for gait training this session secondary to fatigue as pt reports eating this morning for the first time in 4-5 days secondary to esophageal pain. Anticipate tolerance and stamina to progress as pt able to progress nutrition. Educated pt on importance of nutrition, pt verbalized understanding. Pt continues to benefit from skilled PT services to progress toward functional mobility goals.    Recommendations for follow up therapy are one component of a multi-disciplinary discharge planning process, led by the attending physician.  Recommendations may be updated based on patient status, additional functional criteria and insurance authorization.  Follow Up Recommendations  Skilled nursing-short term rehab (<3 hours/day)     Assistance Recommended at Discharge Frequent or constant Supervision/Assistance  Patient can return home with the following A lot of help with walking and/or transfers;Assist for transportation;Help with stairs or ramp for entrance   Equipment Recommendations  Rolling walker (2 wheels)    Recommendations for Other Services       Precautions / Restrictions Precautions Precautions: Fall Restrictions Weight Bearing Restrictions: Yes RLE Weight Bearing: Weight  bearing as tolerated     Mobility  Bed Mobility Overal bed mobility: Needs Assistance Bed Mobility: Sit to Supine     Supine to sit: Min guard     General bed mobility comments: increased time needed, pt able to bring RLE to/off bed with min guard for safety    Transfers Overall transfer level: Needs assistance Equipment used: Rolling walker (2 wheels) Transfers: Sit to/from Stand, Bed to chair/wheelchair/BSC Sit to Stand: Min assist     General transfer comment: increased time needed, assist needed for power up and steadying as pt brings hand from bed to RW    Ambulation/Gait Ambulation/Gait assistance: Min assist, Min guard (assist for cueing only) Gait Distance (Feet): 15 Feet Assistive device: Rolling walker (2 wheels) Gait Pattern/deviations: Step-to pattern, Decreased step length - right, Decreased step length - left, Antalgic, Trunk flexed Gait velocity: Decreased     General Gait Details: VC's for improved posture, sequencing   Stairs             Wheelchair Mobility    Modified Rankin (Stroke Patients Only)       Balance Overall balance assessment: Needs assistance Sitting-balance support: Feet supported, No upper extremity supported Sitting balance-Leahy Scale: Good     Standing balance support: Bilateral upper extremity supported, Reliant on assistive device for balance Standing balance-Leahy Scale: Poor            Cognition Arousal/Alertness: Awake/alert Behavior During Therapy: WFL for tasks assessed/performed Overall Cognitive Status: Within Functional Limits for tasks assessed            Exercises General Exercises - Lower Extremity Quad Sets: AROM, Right, 10 reps, Supine Heel Slides: AROM, Right, 10 reps, Supine Hip ABduction/ADduction: AROM, AAROM, Right,  10 reps, Supine (ADDuction towel squeeze x10) Straight Leg Raises: AROM, AAROM, Right, 10 reps, Supine    General Comments        Pertinent Vitals/Pain Pain  Assessment Pain Assessment: 0-10 Pain Score: 6  Pain Location: R hip Pain Descriptors / Indicators: Operative site guarding, Moaning, Grimacing, Sore Pain Intervention(s): Limited activity within patient's tolerance, Monitored during session, Premedicated before session, Repositioned    Home Living                          Prior Function            PT Goals (current goals can now be found in the care plan section) Acute Rehab PT Goals PT Goal Formulation: With patient/family Time For Goal Achievement: 11/12/21    Frequency    Min 3X/week      PT Plan Current plan remains appropriate    Co-evaluation              AM-PAC PT "6 Clicks" Mobility   Outcome Measure  Help needed turning from your back to your side while in a flat bed without using bedrails?: A Little Help needed moving from lying on your back to sitting on the side of a flat bed without using bedrails?: A Little Help needed moving to and from a bed to a chair (including a wheelchair)?: A Little Help needed standing up from a chair using your arms (e.g., wheelchair or bedside chair)?: A Little Help needed to walk in hospital room?: A Little Help needed climbing 3-5 steps with a railing? : Total 6 Click Score: 16    End of Session Equipment Utilized During Treatment: Gait belt Activity Tolerance: Patient tolerated treatment well Patient left: in bed;with call bell/phone within reach;with bed alarm set;with family/visitor present (NT present to give pt bath) Nurse Communication: Mobility status PT Visit Diagnosis: Unsteadiness on feet (R26.81);Pain Pain - Right/Left: Right Pain - part of body: Hip     Time: 1355-1425 PT Time Calculation (min) (ACUTE ONLY): 30 min  Charges:  $Gait Training: 8-22 mins $Therapeutic Exercise: 8-22 mins                     Giomar Gusler R. PTA Acute Rehabilitation Services Office: [place] 11/04/2021, 2:34 PM

## 2021-11-04 NOTE — Plan of Care (Signed)

## 2021-11-04 NOTE — Progress Notes (Signed)
Physical Therapy Progress Note  Assessment: Pt progressing towards physical therapy goals. Was able to perform transfers and ambulation with chair follow due to pain and fatigue. Will continue to follow and progress as able per POC.   10/31/21 1547  PT Visit Information  Last PT Received On 10/31/21  Assistance Needed +2 (chair follow)  History of Present Illness Pt is an 81 y/o male who presents s/p mechanical fall at home. He was found to have a periprosthetic intertrochanteric femur fracture and is now s/p cephalomedullary nailing on 10/28/2021. He is WBAT through the RLE. PMH significant for HTN, malignant neoplasm of pancreas, other malignant lymphomas, L femuir fracture s/p IM nail 09/2019.  Subjective Data  Patient Stated Goal Eventually return home alone  Precautions  Precautions Fall  Restrictions  Weight Bearing Restrictions Yes  RLE Weight Bearing WBAT  Pain Assessment  Pain Assessment Faces  Faces Pain Scale 6  Pain Location R hip  Pain Descriptors / Indicators Operative site guarding;Moaning;Grimacing;Sore  Pain Intervention(s) Limited activity within patient's tolerance;Monitored during session;Repositioned  Cognition  Arousal/Alertness Awake/alert  Behavior During Therapy WFL for tasks assessed/performed  Overall Cognitive Status Within Functional Limits for tasks assessed  Bed Mobility  Overal bed mobility Needs Assistance  Bed Mobility Sit to Supine  Sit to supine Min assist  General bed mobility comments VC's for sequencing and safety. Pt required assist for RLE elevation back up into bed at end of session.  Transfers  Overall transfer level Needs assistance  Equipment used Rolling walker (2 wheels)  Transfers Sit to/from Stand;Bed to chair/wheelchair/BSC  Sit to Stand +2 physical assistance;Min assist  Bed to/from chair/wheelchair/BSC transfer type: Step pivot  Step pivot transfers Min assist;+2 safety/equipment  General transfer comment Initial stand, pt had  difficulty transitioning hands from chair to walker. Increased independence on later stands, requiring +2 more for safety, not physical assist.  Ambulation/Gait  Ambulation/Gait assistance Min assist;+2 safety/equipment (chair follow)  Gait Distance (Feet) 25 Feet (25', seated rest break, 25')  Assistive device Rolling walker (2 wheels)  Gait Pattern/deviations Decreased stride length;Step-to pattern;Trunk flexed  General Gait Details VC's for improved posture, sequencing and safety with the RW for support. Pt required chair follow and 1 seated rest break.  Gait velocity Decreased  Gait velocity interpretation <1.8 ft/sec, indicate of risk for recurrent falls  Balance  Overall balance assessment Needs assistance  Sitting-balance support Feet supported;No upper extremity supported  Sitting balance-Leahy Scale Fair  Sitting balance - Comments Very guarded due to pain  Standing balance support Bilateral upper extremity supported;Reliant on assistive device for balance  Standing balance-Leahy Scale Poor  Exercises  Exercises General Lower Extremity  General Exercises - Lower Extremity  Quad Sets 10 reps  Heel Slides 10 reps  Hip ABduction/ADduction 10 reps  Long Arc Quad 10 reps  PT - End of Session  Equipment Utilized During Treatment Gait belt  Activity Tolerance Patient tolerated treatment well  Patient left in chair;with call bell/phone within reach;with chair alarm set  Nurse Communication Mobility status   PT - Assessment/Plan  PT Plan Current plan remains appropriate  PT Visit Diagnosis Unsteadiness on feet (R26.81);Pain  Pain - Right/Left Right  Pain - part of body Hip  PT Frequency (ACUTE ONLY) Min 3X/week  Follow Up Recommendations Skilled nursing-short term rehab (<3 hours/day)  Assistance recommended at discharge Frequent or constant Supervision/Assistance  Patient can return home with the following A lot of help with walking and/or transfers;Assist for  transportation;Help with stairs or ramp  for entrance  PT equipment Rolling walker (2 wheels)  AM-PAC PT "6 Clicks" Mobility Outcome Measure (Version 2)  Help needed turning from your back to your side while in a flat bed without using bedrails? 3  Help needed moving from lying on your back to sitting on the side of a flat bed without using bedrails? 3  Help needed moving to and from a bed to a chair (including a wheelchair)? 3  Help needed standing up from a chair using your arms (e.g., wheelchair or bedside chair)? 3  Help needed to walk in hospital room? 2  Help needed climbing 3-5 steps with a railing?  1  6 Click Score 15  Consider Recommendation of Discharge To: CIR/SNF/LTACH  Progressive Mobility  What is the highest level of mobility based on the progressive mobility assessment? Level 5 (Walks with assist in room/hall) - Balance while stepping forward/back and can walk in room with assist - Complete  Activity Ambulated with assistance in hallway  PT Goal Progression  Progress towards PT goals Progressing toward goals  Acute Rehab PT Goals  PT Goal Formulation With patient/family  Time For Goal Achievement 11/12/21  Potential to Achieve Goals Good  PT Time Calculation  PT Start Time (ACUTE ONLY) 1356  PT Stop Time (ACUTE ONLY) 1422  PT Time Calculation (min) (ACUTE ONLY) 26 min  PT General Charges  $$ ACUTE PT VISIT 1 Visit  PT Treatments  $Gait Training 8-22 mins  $Therapeutic Exercise 8-22 mins   Rolinda Roan, PT, DPT Acute Rehabilitation Services Pager: [phone] Office: [phone]   **This note entered as a late entry for date of service 10/31/2021**

## 2021-11-04 NOTE — Plan of Care (Signed)

## 2021-11-05 DIAGNOSIS — I10 Essential (primary) hypertension: Secondary | ICD-10-CM | POA: Diagnosis not present

## 2021-11-05 DIAGNOSIS — R1310 Dysphagia, unspecified: Secondary | ICD-10-CM | POA: Diagnosis not present

## 2021-11-05 LAB — TSH: TSH: 4.896 u[IU]/mL — ABNORMAL HIGH (ref 0.350–4.500)

## 2021-11-05 LAB — T4, FREE: Free T4: 1.15 ng/dL — ABNORMAL HIGH (ref 0.61–1.12)

## 2021-11-05 MED ORDER — POLYETHYLENE GLYCOL 3350 17 G PO PACK
17.0000 g | PACK | Freq: Three times a day (TID) | ORAL | Status: DC
  Administered 2021-11-05 – 2021-11-09 (×11): 17 g via ORAL
  Filled 2021-11-05 (×15): qty 1

## 2021-11-05 MED ORDER — SORBITOL 70 % SOLN
960.0000 mL | TOPICAL_OIL | Freq: Once | ORAL | Status: DC
  Filled 2021-11-05: qty 473

## 2021-11-05 NOTE — Progress Notes (Signed)
[facility] PROGRESS NOTE   [name] [name] UGQ:[id number] DOB: [birth year] DOA: 10/28/2021  8 DOS: the patient was seen and examined on 11/05/2021  PCP: [name], MD  Brief History and Hospital Course:  [name] [name] is a 81 y.o. male with medical history significant of HTN history of lymphoma and BCC who presents by EMS after a fall at home.  Found to have a hip fracture.  S/p repair on 2/10.  Developed significant odynophagia.  Seen by gastroenterology.  Initially given conservative treatment without any improvement.  Patient subsequently underwent EGD on 2/16 which showed severe esophagitis.   Waiting for his oral intake to improve prior to discharge to skilled nursing facility.  Oral intake seems to be improving.  Consultants: Gastroenterology.  Orthopedics  Procedures:   Cephalomedullary nailing of right intertrochanteric femur fracture Removal of hardware right femur    Subjective: Patient mentions that symptoms are improving.  Able to swallow better than before.  Ready to try soft diet.  Denies any other complaints.  Pain in the right leg is reasonably well controlled.    Assessment/Plan:   * Closed right hip fracture ([place])- (present on admission) Closed right hip fracture. S/p repair on 2/10. Pain seems to be reasonably well controlled.   Continue Lovenox for DVT prophylaxis   Odynophagia Despite PPI Carafate and viscous lidocaine patient has not noticed any significant improvement in his symptoms.   Patient was reevaluated by gastroenterology.  Underwent EGD on 2/16 which showed severe reflux esophagitis.  Likely explains his symptoms.  Abdominal x-ray and chest x-ray without acute findings.  Continue PPI twice a day, Carafate.  Pepcid as needed.  Patient and family reassured.   Patient was placed on full liquid diet.  Seems to be doing better today.  Mentions that he was able to consume more of his liquid diet yesterday than before.  Ready to try soft diet  this afternoon.    GERD (gastroesophageal reflux disease) Uses apple cidar vinegar at home for GERD symptoms.   Remains on Protonix.  See discussion under odynophagia.  Essential hypertension- (present on admission) Blood pressure reasonably well controlled.  Occasional high readings noted.  Continue amlodipine and lisinopril.  Constipation Continue bowel regimen.   Has not had a bowel movement in 4 days now.  Will use magnesium citrate. TSH 4.8, free T41.15.    Hyponatremia Resolved  Acute postoperative anemia due to expected blood loss Drop in hemoglobin from surgery and fracture.  Stable for the most part.  Has not required any blood transfusion    DVT Prophylaxis: Lovenox Code Status: Full code Family Communication: Discussed with patient.  No family at bedside. Disposition Plan: SNF eventually once oral intake is improved.  Status is: Inpatient Remains inpatient appropriate because: Hip fracture.  Odynophagia     Medications: Scheduled:  (feeding supplement) PROSource Plus  30 mL Oral BID BM   amLODipine  5 mg Oral Daily   Chlorhexidine Gluconate Cloth  6 each Topical Daily   cholecalciferol  10,000 Units Oral Daily   enoxaparin (LOVENOX) injection  40 mg Subcutaneous Q24H   feeding supplement  237 mL Oral TID BM   lisinopril  20 mg Oral Daily   magnesium oxide  400 mg Oral Daily   multivitamin with minerals  1 tablet Oral Daily   pantoprazole (PROTONIX) IV  40 mg Intravenous Q12H   polyethylene glycol  17 g Oral BID   senna-docusate  2 tablet Oral BID   sodium chloride  flush  10-40 mL Intracatheter Q12H   sucralfate  2 g Oral TID PC,HS,0200   Continuous:  RKY:HCWC & mag hydroxide-simeth, bisacodyl, calcium carbonate, famotidine, lidocaine, ondansetron **OR** ondansetron (ZOFRAN) IV, phenol, sodium chloride flush, sodium phosphate, traMADol  Antibiotics: Anti-infectives (From admission, onward)    Start     Dose/Rate Route Frequency Ordered Stop    10/28/21 2200  ceFAZolin (ANCEF) IVPB 2g/100 mL premix        2 g 200 mL/hr over 30 Minutes Intravenous Every 8 hours 10/28/21 1807 10/29/21 1316   10/28/21 1245  ceFAZolin (ANCEF) IVPB 2g/100 mL premix        2 g 200 mL/hr over 30 Minutes Intravenous On call to O.R. 10/28/21 1218 10/28/21 1501       Objective:  Vital Signs  Vitals:   11/04/21 1557 11/04/21 2000 11/05/21 0500 11/05/21 0740  BP: 126/64 122/66 (!) 141/73 (!) 157/79  Pulse: 80 86 76 76  Resp: 18 16 17 18   Temp: 97.9 F (36.6 C) 98.5 F (36.9 C)  98.8 F (37.1 C)  TempSrc: Oral Oral  Oral  SpO2: 96% 97% 100% 99%  Weight:      Height:        Intake/Output Summary (Last 24 hours) at 11/05/2021 1045 Last data filed at 11/05/2021 0746 Gross per 24 hour  Intake 480 ml  Output 2150 ml  Net -1670 ml    Filed Weights   10/28/21 1239 10/28/21 1742 11/03/21 0903  Weight: 79.4 kg 83.8 kg 79.4 kg    General appearance: Awake alert.  In no distress Resp: Clear to auscultation bilaterally.  Normal effort Cardio: S1-S2 is normal regular.  No S3-S4.  No rubs murmurs or bruit GI: Abdomen is soft.  Nontender nondistended.  Bowel sounds are present normal.  No masses organomegaly Neurologic: Alert and oriented x3.  No focal neurological deficits.      Lab Results:  Data Reviewed: I have personally reviewed labs and imaging study reports  CBC: Recent Labs  Lab 10/30/21 0406 10/31/21 0414 11/02/21 0335 11/04/21 0406  WBC 13.0* 8.2 7.8 9.0  HGB 8.9* 8.8* 9.6* 10.3*  HCT 27.4* 26.7* 29.9* 31.8*  MCV 88.4 89.6 88.2 88.3  PLT 110* 123* 207 271     Basic Metabolic Panel: Recent Labs  Lab 10/30/21 0406 10/31/21 0414 11/04/21 0406  NA 136 136 136  K 4.1 3.7 3.8  CL 102 102 104  CO2 28 30 25   GLUCOSE 131* 95 99  BUN 18 14 12   CREATININE 1.07 0.93 1.05  CALCIUM 8.1* 7.8* 8.2*     GFR: Estimated Creatinine Clearance: 55.2 mL/min (by C-G formula based on SCr of 1.05 mg/dL).    Recent Results  (from the past 240 hour(s))  Resp Panel by RT-PCR (Flu A&B, Covid) Nasopharyngeal Swab     Status: None   Collection Time: 10/28/21  1:20 AM   Specimen: Nasopharyngeal Swab; Nasopharyngeal(NP) swabs in vial transport medium  Result Value Ref Range Status   SARS Coronavirus 2 by RT PCR NEGATIVE NEGATIVE Final    Comment: (NOTE) SARS-CoV-2 target nucleic acids are NOT DETECTED.  The SARS-CoV-2 RNA is generally detectable in upper respiratory specimens during the acute phase of infection. The lowest concentration of SARS-CoV-2 viral copies this assay can detect is 138 copies/mL. A negative result does not preclude SARS-Cov-2 infection and should not be used as the sole basis for treatment or other patient management decisions. A negative result may occur with  improper specimen  collection/handling, submission of specimen other than nasopharyngeal swab, presence of viral mutation(s) within the areas targeted by this assay, and inadequate number of viral copies(<138 copies/mL). A negative result must be combined with clinical observations, patient history, and epidemiological information. The expected result is Negative.  Fact Sheet for Patients:  EntrepreneurPulse.com.au  Fact Sheet for Healthcare Providers:  IncredibleEmployment.be  This test is no t yet approved or cleared by the [place] FDA and  has been authorized for detection and/or diagnosis of SARS-CoV-2 by FDA under an Emergency Use Authorization (EUA). This EUA will remain  in effect (meaning this test can be used) for the duration of the COVID-19 declaration under Section 564(b)(1) of the Act, 21 U.S.C.section 360bbb-3(b)(1), unless the authorization is terminated  or revoked sooner.       Influenza A by PCR NEGATIVE NEGATIVE Final   Influenza B by PCR NEGATIVE NEGATIVE Final    Comment: (NOTE) The Xpert Xpress SARS-CoV-2/FLU/RSV plus assay is intended as an aid in the  diagnosis of influenza from Nasopharyngeal swab specimens and should not be used as a sole basis for treatment. Nasal washings and aspirates are unacceptable for Xpert Xpress SARS-CoV-2/FLU/RSV testing.  Fact Sheet for Patients: EntrepreneurPulse.com.au  Fact Sheet for Healthcare Providers: IncredibleEmployment.be  This test is not yet approved or cleared by the [place] FDA and has been authorized for detection and/or diagnosis of SARS-CoV-2 by FDA under an Emergency Use Authorization (EUA). This EUA will remain in effect (meaning this test can be used) for the duration of the COVID-19 declaration under Section 564(b)(1) of the Act, 21 U.S.C. section 360bbb-3(b)(1), unless the authorization is terminated or revoked.  Performed at [facility], [place] [address], [place], [place] [address]   Surgical PCR Screen     Status: None   Collection Time: 10/28/21  1:43 PM   Specimen: Nasal Mucosa; Nasal Swab  Result Value Ref Range Status   MRSA, PCR NEGATIVE NEGATIVE Final   Staphylococcus aureus NEGATIVE NEGATIVE Final    Comment: (NOTE) The Xpert SA Assay (FDA approved for NASAL specimens in patients 44 years of age and older), is one component of a comprehensive surveillance program. It is not intended to diagnose infection nor to guide or monitor treatment. Performed at [facility], [place] [address] [place], [place] [address]        Radiology Studies: DG CHEST PORT 1 VIEW  Result Date: 11/03/2021 CLINICAL DATA:  Chest and abdominal pain EXAM: PORTABLE CHEST 1 VIEW COMPARISON:  10/28/2021 FINDINGS: Single frontal view of the chest demonstrates a right chest wall port via subclavian approach tip overlying the superior vena cava. Cardiac silhouette is stable. No airspace disease, effusion, or pneumothorax. No acute bony abnormalities. IMPRESSION: 1. No acute intrathoracic process. Electronically Signed   By: Randa Ngo M.D.    On: 11/03/2021 17:27   DG Abd Portable 1V  Result Date: 11/03/2021 CLINICAL DATA:  Abdominal pain EXAM: PORTABLE ABDOMEN - 1 VIEW COMPARISON:  10/28/2021 FINDINGS: Supine frontal view of the abdomen and pelvis excludes the hemidiaphragms and right flank by collimation. No bowel obstruction or ileus. Mild stool and retained contrast throughout the colon. No masses or abnormal calcifications. Postsurgical changes of the bilateral hips. IMPRESSION: 1. Unremarkable bowel gas pattern. 2. Mild fecal retention. Electronically Signed   By: Randa Ngo M.D.   On: 11/03/2021 17:27       LOS: 8 days   [facility] Pager on [email or web address]  11/05/2021, 10:45  AM

## 2021-11-05 NOTE — Plan of Care (Signed)

## 2021-11-05 NOTE — Plan of Care (Signed)
°  Problem: Education: Goal: Knowledge of General Education information will improve Description: Including pain rating scale, medication(s)/side effects and non-pharmacologic comfort measures Outcome: Progressing   Problem: Activity: Goal: Risk for activity intolerance will decrease Outcome: Progressing   Problem: Pain Managment: Goal: General experience of comfort will improve Outcome: Progressing   Problem: Safety: Goal: Ability to remain free from injury will improve Outcome: Progressing   Problem: Skin Integrity: Goal: Risk for impaired skin integrity will decrease Outcome: Progressing   Problem: Elimination: Goal: Will not experience complications related to bowel motility Outcome: Not Progressing

## 2021-11-05 NOTE — Progress Notes (Signed)
Mobility Specialist Progress Note:   11/05/21 1012  Mobility  Bed Position Chair  Activity Ambulated with assistance in room;Ambulated with assistance to bathroom  Level of Assistance Contact guard assist, steadying assist  Assistive Device Front wheel walker  RLE Weight Bearing WBAT  Distance Ambulated (ft) 40 ft  Activity Response Tolerated well  $Mobility charge 1 Mobility   Pt received in bed willing to participate in mobility. Pt was able to stand without assist from elevated bed. Complaints of R leg pain. Left in chair with call bell in reach and all needs met.   [facility] Public librarian Phone [phone]

## 2021-11-06 ENCOUNTER — Encounter (HOSPITAL_COMMUNITY): Payer: Self-pay | Admitting: Gastroenterology

## 2021-11-06 DIAGNOSIS — R1310 Dysphagia, unspecified: Secondary | ICD-10-CM | POA: Diagnosis not present

## 2021-11-06 DIAGNOSIS — I10 Essential (primary) hypertension: Secondary | ICD-10-CM | POA: Diagnosis not present

## 2021-11-06 MED ORDER — SUCRALFATE 1 GM/10ML PO SUSP: 2.0000 g | Freq: Three times a day (TID) | ORAL | 0 refills | Status: DC

## 2021-11-06 MED ORDER — BISACODYL 10 MG RE SUPP: 10.0000 mg | Freq: Every day | RECTAL | 0 refills | Status: AC | PRN

## 2021-11-06 MED ORDER — SENNOSIDES-DOCUSATE SODIUM 8.6-50 MG PO TABS: 2.0000 | ORAL_TABLET | Freq: Two times a day (BID) | ORAL | Status: AC

## 2021-11-06 MED ORDER — PANTOPRAZOLE SODIUM 40 MG PO TBEC: 40.0000 mg | DELAYED_RELEASE_TABLET | Freq: Two times a day (BID) | ORAL | 1 refills | Status: DC

## 2021-11-06 MED ORDER — ALUM & MAG HYDROXIDE-SIMETH 200-200-20 MG/5ML PO SUSP: 15.0000 mL | Freq: Four times a day (QID) | ORAL | 0 refills | Status: DC | PRN

## 2021-11-06 MED ORDER — POLYETHYLENE GLYCOL 3350 17 G PO PACK: 17.0000 g | PACK | Freq: Three times a day (TID) | ORAL | 0 refills | Status: DC

## 2021-11-06 MED ORDER — CALCIUM CARBONATE ANTACID 500 MG PO CHEW: 1.0000 | CHEWABLE_TABLET | Freq: Three times a day (TID) | ORAL | Status: DC | PRN

## 2021-11-06 MED ORDER — ENSURE ENLIVE PO LIQD: 237.0000 mL | Freq: Three times a day (TID) | ORAL | 12 refills | Status: AC

## 2021-11-06 MED ORDER — FAMOTIDINE 10 MG PO TABS: 10.0000 mg | ORAL_TABLET | Freq: Two times a day (BID) | ORAL | Status: DC | PRN

## 2021-11-06 MED ORDER — PROSOURCE PLUS PO LIQD: 30.0000 mL | Freq: Two times a day (BID) | ORAL | Status: DC

## 2021-11-06 NOTE — Progress Notes (Signed)
[facility] PROGRESS NOTE   [name] [name] XKG:[id number] DOB: 1941-07-13 DOA: 10/28/2021  9 DOS: the patient was seen and examined on 11/06/2021  PCP: [name], MD  Brief History and Hospital Course:  [name] [name] is a 81 y.o. male with medical history significant of HTN history of lymphoma and BCC who presents by EMS after a fall at home.  Found to have a hip fracture.  S/p repair on 2/10.  Developed significant odynophagia.  Seen by gastroenterology.  Initially given conservative treatment without any improvement.  Patient subsequently underwent EGD on 2/16 which showed severe esophagitis.   Symptoms are slowly improving.  He is able to tolerate soft.  Hopefully to SNF tomorrow  Consultants: Gastroenterology.  Orthopedics  Procedures:   Cephalomedullary nailing of right intertrochanteric femur fracture Removal of hardware right femur    Subjective: Patient feels better.  Had a bowel movement overnight.  Tolerating soft diet.  Denies any abdominal pain.  No nausea or vomiting.  Pain in the leg is reasonably well controlled    Assessment/Plan:   * Closed right hip fracture ([place])- (present on admission) Closed right hip fracture. S/p repair on 2/10. Pain seems to be reasonably well controlled.   Continue Lovenox for DVT prophylaxis   Odynophagia Despite PPI Carafate and viscous lidocaine patient has not noticed any significant improvement in his symptoms.   Patient was reevaluated by gastroenterology.  Underwent EGD on 2/16 which showed severe reflux esophagitis.  Likely explains his symptoms.  Abdominal x-ray and chest x-ray without acute findings.  Continue PPI twice a day, Carafate.  Pepcid as needed.  Patient and family reassured.   Patient tolerated full liquid diet well.  He was advanced to soft diet which seems to be tolerating well also.  Patient feels better.  Hopefully discharge tomorrow.  GERD (gastroesophageal reflux disease) Uses apple cidar vinegar  at home for GERD symptoms.   Remains on Protonix and Carafate.  See discussion under odynophagia.  Essential hypertension- (present on admission) Blood pressure reasonably well controlled.  Occasional high readings noted.  Continue amlodipine and lisinopril.  Constipation TSH 4.8, free T41.15.   On very aggressive bowel regimen.  Had to be given a smog enema yesterday with which he did have a bowel movement.  Hyponatremia Resolved  Acute postoperative anemia due to expected blood loss Drop in hemoglobin from surgery and fracture.  Stable for the most part.  Has not required any blood transfusion    DVT Prophylaxis: Lovenox Code Status: Full code Family Communication: Discussed with patient.  Discussed with daughter yesterday. Disposition Plan: Hopefully to SNF tomorrow.  Status is: Inpatient Remains inpatient appropriate because: Hip fracture.  Odynophagia     Medications: Scheduled:  (feeding supplement) PROSource Plus  30 mL Oral BID BM   amLODipine  5 mg Oral Daily   Chlorhexidine Gluconate Cloth  6 each Topical Daily   cholecalciferol  10,000 Units Oral Daily   enoxaparin (LOVENOX) injection  40 mg Subcutaneous Q24H   feeding supplement  237 mL Oral TID BM   lisinopril  20 mg Oral Daily   magnesium oxide  400 mg Oral Daily   multivitamin with minerals  1 tablet Oral Daily   pantoprazole (PROTONIX) IV  40 mg Intravenous Q12H   polyethylene glycol  17 g Oral TID   senna-docusate  2 tablet Oral BID   sodium chloride flush  10-40 mL Intracatheter Q12H   sorbitol, milk of mag, mineral oil, glycerin (SMOG) enema  960 mL Rectal Once   sucralfate  2 g Oral TID PC,HS,0200   Continuous:  UXN:ATFT & mag hydroxide-simeth, bisacodyl, calcium carbonate, famotidine, lidocaine, ondansetron **OR** ondansetron (ZOFRAN) IV, phenol, sodium chloride flush, sodium phosphate, traMADol  Antibiotics: Anti-infectives (From admission, onward)    Start     Dose/Rate Route Frequency  Ordered Stop   10/28/21 2200  ceFAZolin (ANCEF) IVPB 2g/100 mL premix        2 g 200 mL/hr over 30 Minutes Intravenous Every 8 hours 10/28/21 1807 10/29/21 1316   10/28/21 1245  ceFAZolin (ANCEF) IVPB 2g/100 mL premix        2 g 200 mL/hr over 30 Minutes Intravenous On call to O.R. 10/28/21 1218 10/28/21 1501       Objective:  Vital Signs  Vitals:   11/05/21 0500 11/05/21 0740 11/05/21 2030 11/06/21 0546  BP: (!) 141/73 (!) 157/79 111/64 125/68  Pulse: 76 76 80 87  Resp: 17 18 14 14   Temp:  98.8 F (37.1 C) 97.7 F (36.5 C) (!) 97.4 F (36.3 C)  TempSrc:  Oral    SpO2: 100% 99% 97% 97%  Weight:      Height:        Intake/Output Summary (Last 24 hours) at 11/06/2021 0917 Last data filed at 11/06/2021 0725 Gross per 24 hour  Intake 240 ml  Output 850 ml  Net -610 ml    Filed Weights   10/28/21 1239 10/28/21 1742 11/03/21 0903  Weight: 79.4 kg 83.8 kg 79.4 kg    General appearance: Awake alert.  In no distress Resp: Clear to auscultation bilaterally.  Normal effort Cardio: S1-S2 is normal regular.  No S3-S4.  No rubs murmurs or bruit GI: Abdomen is soft.  Nontender nondistended.  Bowel sounds are present normal.  No masses organomegaly Extremities: No edema.  Neurologic: Alert and oriented x3.  No focal neurological deficits.      Lab Results:  Data Reviewed: I have personally reviewed labs and imaging study reports  CBC: Recent Labs  Lab 10/31/21 0414 11/02/21 0335 11/04/21 0406  WBC 8.2 7.8 9.0  HGB 8.8* 9.6* 10.3*  HCT 26.7* 29.9* 31.8*  MCV 89.6 88.2 88.3  PLT 123* 207 271     Basic Metabolic Panel: Recent Labs  Lab 10/31/21 0414 11/04/21 0406  NA 136 136  K 3.7 3.8  CL 102 104  CO2 30 25  GLUCOSE 95 99  BUN 14 12  CREATININE 0.93 1.05  CALCIUM 7.8* 8.2*     GFR: Estimated Creatinine Clearance: 55.2 mL/min (by C-G formula based on SCr of 1.05 mg/dL).    Recent Results (from the past 240 hour(s))  Resp Panel by RT-PCR (Flu  A&B, Covid) Nasopharyngeal Swab     Status: None   Collection Time: 10/28/21  1:20 AM   Specimen: Nasopharyngeal Swab; Nasopharyngeal(NP) swabs in vial transport medium  Result Value Ref Range Status   SARS Coronavirus 2 by RT PCR NEGATIVE NEGATIVE Final    Comment: (NOTE) SARS-CoV-2 target nucleic acids are NOT DETECTED.  The SARS-CoV-2 RNA is generally detectable in upper respiratory specimens during the acute phase of infection. The lowest concentration of SARS-CoV-2 viral copies this assay can detect is 138 copies/mL. A negative result does not preclude SARS-Cov-2 infection and should not be used as the sole basis for treatment or other patient management decisions. A negative result may occur with  improper specimen collection/handling, submission of specimen other than nasopharyngeal swab, presence of viral mutation(s) within the  areas targeted by this assay, and inadequate number of viral copies(<138 copies/mL). A negative result must be combined with clinical observations, patient history, and epidemiological information. The expected result is Negative.  Fact Sheet for Patients:  EntrepreneurPulse.com.au  Fact Sheet for Healthcare Providers:  IncredibleEmployment.be  This test is no t yet approved or cleared by the [place] FDA and  has been authorized for detection and/or diagnosis of SARS-CoV-2 by FDA under an Emergency Use Authorization (EUA). This EUA will remain  in effect (meaning this test can be used) for the duration of the COVID-19 declaration under Section 564(b)(1) of the Act, 21 U.S.C.section 360bbb-3(b)(1), unless the authorization is terminated  or revoked sooner.       Influenza A by PCR NEGATIVE NEGATIVE Final   Influenza B by PCR NEGATIVE NEGATIVE Final    Comment: (NOTE) The Xpert Xpress SARS-CoV-2/FLU/RSV plus assay is intended as an aid in the diagnosis of influenza from Nasopharyngeal swab specimens  and should not be used as a sole basis for treatment. Nasal washings and aspirates are unacceptable for Xpert Xpress SARS-CoV-2/FLU/RSV testing.  Fact Sheet for Patients: EntrepreneurPulse.com.au  Fact Sheet for Healthcare Providers: IncredibleEmployment.be  This test is not yet approved or cleared by the [place] FDA and has been authorized for detection and/or diagnosis of SARS-CoV-2 by FDA under an Emergency Use Authorization (EUA). This EUA will remain in effect (meaning this test can be used) for the duration of the COVID-19 declaration under Section 564(b)(1) of the Act, 21 U.S.C. section 360bbb-3(b)(1), unless the authorization is terminated or revoked.  Performed at [facility], [place] [address] [place], [place] [address]   Surgical PCR Screen     Status: None   Collection Time: 10/28/21  1:43 PM   Specimen: Nasal Mucosa; Nasal Swab  Result Value Ref Range Status   MRSA, PCR NEGATIVE NEGATIVE Final   Staphylococcus aureus NEGATIVE NEGATIVE Final    Comment: (NOTE) The Xpert SA Assay (FDA approved for NASAL specimens in patients 36 years of age and older), is one component of a comprehensive surveillance program. It is not intended to diagnose infection nor to guide or monitor treatment. Performed at [facility], [place] [address], [place], [place] [address]        Radiology Studies: No results found.     LOS: 9 days   Avalee Castrellon [employer] on [email or web address]  11/06/2021, 9:17 AM

## 2021-11-06 NOTE — Plan of Care (Signed)

## 2021-11-06 NOTE — Progress Notes (Signed)
Mobility Specialist Progress Note:   11/06/21 1000  Mobility  Activity Ambulated with assistance in room  Level of Assistance Minimal assist, patient does 75% or more  Assistive Device Front wheel walker  Distance Ambulated (ft) 56 ft  Activity Response Tolerated well  $Mobility charge 1 Mobility   Pt received in bed willing to participate in mobility. Complaints of 4/10 R hip pain. Pt left on BSC with call bell in reach and was instructed to hit call button when finished.   [facility] Public librarian Phone [phone]

## 2021-11-07 DIAGNOSIS — S72001A Fracture of unspecified part of neck of right femur, initial encounter for closed fracture: Secondary | ICD-10-CM | POA: Diagnosis not present

## 2021-11-07 LAB — BASIC METABOLIC PANEL
Anion gap: 8 (ref 5–15)
BUN: 16 mg/dL (ref 8–23)
CO2: 26 mmol/L (ref 22–32)
Calcium: 8.5 mg/dL — ABNORMAL LOW (ref 8.9–10.3)
Chloride: 102 mmol/L (ref 98–111)
Creatinine, Ser: 1.08 mg/dL (ref 0.61–1.24)
GFR, Estimated: >60 mL/min (ref >60)
Glucose, Bld: 119 mg/dL — ABNORMAL HIGH (ref 70–99)
Potassium: 3.8 mmol/L (ref 3.5–5.1)
Sodium: 136 mmol/L (ref 135–145)

## 2021-11-07 LAB — CBC
HCT: 32.4 % — ABNORMAL LOW (ref 39.0–52.0)
Hemoglobin: 10.7 g/dL — ABNORMAL LOW (ref 13.0–17.0)
MCH: 29.2 pg (ref 26.0–34.0)
MCHC: 33 g/dL (ref 30.0–36.0)
MCV: 88.3 fL (ref 80.0–100.0)
Platelets: 339 10*3/uL (ref 150–400)
RBC: 3.67 MIL/uL — ABNORMAL LOW (ref 4.22–5.81)
RDW: 14.8 % (ref 11.5–15.5)
WBC: 8.7 10*3/uL (ref 4.0–10.5)
nRBC: 0 % (ref 0.0–0.2)

## 2021-11-07 LAB — SURGICAL PATHOLOGY

## 2021-11-07 LAB — MAGNESIUM: Magnesium: 2.1 mg/dL (ref 1.7–2.4)

## 2021-11-07 NOTE — Telephone Encounter (Signed)
Spoke with pt's daughter who stated that the hospital called her back and everything was worked out.

## 2021-11-07 NOTE — Care Management Important Message (Signed)
Important Message  Patient Details  Name: [name] [name] MRN: [id number] Date of Birth: [birth year]   Medicare Important Message Given:  Yes     [name] 11/07/2021, 2:23 PM

## 2021-11-07 NOTE — TOC Progression Note (Addendum)
Transition of Care [facility]) - Progression Note    Patient Details  Name: [name] [name] MRN: [id number] Date of Birth: 28-Apr-1941  Transition of Care [facility]) CM/SW Contact  Joanne Chars, LCSW Phone Number: 11/07/2021, 10:51 AM  Clinical Narrative:   Per MD, pt stable for DC to SNF.  Auth expired at [facility] yesterday, Claiborne Billings at [place] has initiated another [place] and waiting on response from [place].   1420-TC Kelly, no response from [place]. 1425: TC pt daughter Mickel Baas, updated her.   Expected Discharge Plan: [place] Barriers to Discharge: Continued Medical Work up, Ship broker, SNF Pending bed offer  Expected Discharge Plan and Services Expected Discharge Plan: [place] In-house Referral: Clinical Social Work     Living arrangements for the past 2 months: Single Family Home Expected Discharge Date: 11/07/21                                     Social Determinants of Health (SDOH) Interventions    Readmission Risk Interventions No flowsheet data found.

## 2021-11-07 NOTE — Plan of Care (Signed)
  Problem: Education: Goal: Knowledge of General Education information will improve Description: Including pain rating scale, medication(s)/side effects and non-pharmacologic comfort measures Outcome: Progressing   Problem: Activity: Goal: Risk for activity intolerance will decrease Outcome: Progressing   Problem: Nutrition: Goal: Adequate nutrition will be maintained Outcome: Progressing   Problem: Coping: Goal: Level of anxiety will decrease Outcome: Progressing   

## 2021-11-07 NOTE — Progress Notes (Signed)
Physical Therapy Treatment Patient Details Name: [name] [name] MRN: [id number] DOB: 1941-02-19 Today's Date: 11/07/2021   History of Present Illness Pt is an 81 y/o male who presents s/p mechanical fall at home. He was found to have a periprosthetic intertrochanteric femur fracture and is now s/p cephalomedullary nailing on 10/28/2021. He is WBAT through the [facility]. PMH significant for HTN, malignant neoplasm of pancreas, other malignant lymphomas, L femuir fracture s/p IM nail 09/2019.    PT Comments    Pt received in supine and agreeable to session with fair tolerance. Gross min guard needed throughout for ambulation and transfers. Pt able to power up to standing this session without physical assist with minimal cueing needed for sequencing. Ambulation distance limited by pt fatigue and pt requesting to sit. Encouraged pt to participate in functional mobility as much as possible (up to BR, etc.) pt verbalized understanding and benefit. Anticipate safe pt discharge to SNF once medically cleared, will follow acutely. Pt continues to benefit from skilled PT services to progress toward functional mobility goals.     Recommendations for follow up therapy are one component of a multi-disciplinary discharge planning process, led by the attending physician.  Recommendations may be updated based on patient status, additional functional criteria and insurance authorization.  Follow Up Recommendations  Skilled nursing-short term rehab (<3 hours/day)     Assistance Recommended at Discharge Frequent or constant Supervision/Assistance  Patient can return home with the following A lot of help with walking and/or transfers;Assist for transportation;Help with stairs or ramp for entrance   Equipment Recommendations  Rolling walker (2 wheels)    Recommendations for Other Services       Precautions / Restrictions Precautions Precautions: Fall Restrictions Weight Bearing Restrictions: Yes RLE Weight  Bearing: Weight bearing as tolerated     Mobility  Bed Mobility Overal bed mobility: Needs Assistance, Modified Independent Bed Mobility: Supine to Sit     Supine to sit: Modified independent (Device/Increase time)     General bed mobility comments: use of bedrail and increased time needed    Transfers Overall transfer level: Needs assistance Equipment used: Rolling walker (2 wheels) Transfers: Sit to/from Stand Sit to Stand: Min guard           General transfer comment: increased time needed EOB prior to standing, from elevated EOB. able to power up and come static standing without physical assist.    Ambulation/Gait Ambulation/Gait assistance: Min guard (assist for cueing only) Gait Distance (Feet): 20 Feet Assistive device: Rolling walker (2 wheels) Gait Pattern/deviations: Step-to pattern, Decreased step length - right, Decreased step length - left, Antalgic, Trunk flexed Gait velocity: Decreased     General Gait Details: VC's for improved posture, distance limited by fatigue   Stairs             Wheelchair Mobility    Modified Rankin (Stroke Patients Only)       Balance Overall balance assessment: Needs assistance Sitting-balance support: Feet supported, No upper extremity supported Sitting balance-Leahy Scale: Good     Standing balance support: Bilateral upper extremity supported, Reliant on assistive device for balance Standing balance-Leahy Scale: Poor                              Cognition Arousal/Alertness: Awake/alert Behavior During Therapy: WFL for tasks assessed/performed Overall Cognitive Status: Within Functional Limits for tasks assessed  Exercises      General Comments        Pertinent Vitals/Pain Pain Assessment Pain Assessment: Faces Faces Pain Scale: Hurts a little bit Pain Location: R hip Pain Descriptors / Indicators: Operative site guarding,  Moaning, Grimacing, Sore Pain Intervention(s): Limited activity within patient's tolerance, Monitored during session, Repositioned    Home Living                          Prior Function            PT Goals (current goals can now be found in the care plan section) Acute Rehab PT Goals PT Goal Formulation: With patient/family Time For Goal Achievement: 11/12/21    Frequency    Min 3X/week      PT Plan Current plan remains appropriate    Co-evaluation              AM-PAC PT "6 Clicks" Mobility   Outcome Measure  Help needed turning from your back to your side while in a flat bed without using bedrails?: A Little Help needed moving from lying on your back to sitting on the side of a flat bed without using bedrails?: A Little Help needed moving to and from a bed to a chair (including a wheelchair)?: A Little Help needed standing up from a chair using your arms (e.g., wheelchair or bedside chair)?: A Little Help needed to walk in hospital room?: A Little Help needed climbing 3-5 steps with a railing? : Total 6 Click Score: 16    End of Session Equipment Utilized During Treatment: Gait belt Activity Tolerance: Patient tolerated treatment well;Patient limited by fatigue Patient left: in chair;with call bell/phone within reach;with chair alarm set (NT present to give pt bath) Nurse Communication: Mobility status PT Visit Diagnosis: Unsteadiness on feet (R26.81);Pain Pain - Right/Left: Right Pain - part of body: Hip     Time: 1045-1105 PT Time Calculation (min) (ACUTE ONLY): 20 min  Charges:  $Gait Training: 8-22 mins                    Wrigley Plasencia R. PTA Acute Rehabilitation Services Office: [place] 11/07/2021, 11:14 AM

## 2021-11-07 NOTE — Discharge Summary (Signed)
[facility]  Physician Discharge Summary   Patient ID: [name] [name] MRN: [id number] DOB/AGE: 82-Mar-1942 82 y.o.  Admit date: 10/28/2021 Discharge date:   11/07/2021   PCP: [name], MD  DISCHARGE DIAGNOSES:  Principal Problem:   Closed right hip fracture ([place]) Active Problems:   GERD (gastroesophageal reflux disease)   Odynophagia   Essential hypertension   Constipation   Hyponatremia   Acute postoperative anemia due to expected blood loss   RECOMMENDATIONS FOR OUTPATIENT FOLLOW UP: Please check CBC and basic metabolic panel in 1 week Patient needs to be followed up by Dr. Bryan Lemma with gastroenterology in the next few weeks. Follow-up with Dr. Doreatha Martin, orthopedic surgeon   Home Health: Going to SNF Equipment/Devices: None  CODE STATUS: Full code  DISCHARGE CONDITION: fair  Diet recommendation: Soft diet for now  INITIAL HISTORY: [name] [name] is a 81 y.o. male with medical history significant of HTN history of lymphoma and BCC who presents by EMS after a fall at home.  Found to have a hip fracture.  S/p repair on 2/10.  Developed significant odynophagia.  Seen by gastroenterology.  Initially given conservative treatment without any improvement.  Patient subsequently underwent EGD on 2/16 which showed severe esophagitis.   Symptoms are slowly improving.  He is able to tolerate soft.  Okay for discharge to skilled nursing facility.  Consultants: Gastroenterology.  Orthopedics   Procedures:   Cephalomedullary nailing of right intertrochanteric femur fracture Removal of hardware right femur  EGD 11/03/21 Impression:               - LA Grade D reflux esophagitis with no bleeding.                            Biopsied.                           - Normal stomach.                           - Normal examined duodenum.  HOSPITAL COURSE:    * Closed right hip fracture ([place])- (present on admission) Closed right hip fracture. S/p repair on 2/10. Pain  seems to be reasonably well controlled.   Orthopedics to transition from Lovenox to Eliquis at discharge.  Odynophagia Despite PPI Carafate and viscous lidocaine patient has not noticed any significant improvement in his symptoms.   Patient was reevaluated by gastroenterology.  Underwent EGD on 2/16 which showed severe reflux esophagitis.  Likely explains his symptoms.  Abdominal x-ray and chest x-ray without acute findings.  Continue PPI twice a day, Carafate.  Pepcid as needed.  Patient and family reassured.   Patient's diet was slowly advanced.  He is tolerating his soft diet well.  He is able to consume about 50% of his meals.  He was reassured.  He was told that his symptoms are gradually improved.  GERD (gastroesophageal reflux disease) Remains on Protonix and Carafate.  See discussion under odynophagia.  Essential hypertension- (present on admission) Blood pressure reasonably well controlled.  Continue amlodipine and lisinopril.  Constipation TSH 4.8, free T41.15.   He was placed on aggressive bowel regimen.  Had to be given a smog enema.  Finally having bowel movements.  Continue aggressive bowel regimen for now.  As his mobility improves his constipation is also resolved.  Hyponatremia Resolved  Acute postoperative anemia due  to expected blood loss Drop in hemoglobin from surgery and fracture.  Stable for the most part.  Has not required any blood transfusion   Patient feeling better.  Looking forward to going to rehab.  He is stable for discharge to SNF today.  PERTINENT LABS:  The results of significant diagnostics from this hospitalization (including imaging, microbiology, ancillary and laboratory) are listed below for reference.    Microbiology: Recent Results (from the past 240 hour(s))  Surgical PCR Screen     Status: None   Collection Time: 10/28/21  1:43 PM   Specimen: Nasal Mucosa; Nasal Swab  Result Value Ref Range Status   MRSA, PCR NEGATIVE NEGATIVE Final    Staphylococcus aureus NEGATIVE NEGATIVE Final    Comment: (NOTE) The Xpert SA Assay (FDA approved for NASAL specimens in patients 21 years of age and older), is one component of a comprehensive surveillance program. It is not intended to diagnose infection nor to guide or monitor treatment. Performed at [facility], [place] [address], [place], [place] [address]      Labs:  COVID-19 Labs  Lab Results  Component Value Date   SARSCOV2NAA NEGATIVE 10/28/2021   [place] NEGATIVE 10/08/2019   [place] NEGATIVE 10/04/2019      Basic Metabolic Panel: Recent Labs  Lab 11/04/21 0406 11/07/21 0502  NA 136 136  K 3.8 3.8  CL 104 102  CO2 25 26  GLUCOSE 99 119*  BUN 12 16  CREATININE 1.05 1.08  CALCIUM 8.2* 8.5*  MG  --  2.1    CBC: Recent Labs  Lab 11/02/21 0335 11/04/21 0406 11/07/21 0502  WBC 7.8 9.0 8.7  HGB 9.6* 10.3* 10.7*  HCT 29.9* 31.8* 32.4*  MCV 88.2 88.3 88.3  PLT 207 271 339     IMAGING STUDIES DG Chest 1 View  Result Date: 10/28/2021 CLINICAL DATA:  Trauma EXAM: CHEST  1 VIEW COMPARISON:  CT chest dated 06/06/2012 FINDINGS: Mild left basilar atelectasis. Right lung is clear No pleural effusion or pneumothorax. The heart is normal in size. Right chest power port terminates in the upper right atrium. Mild deformity of the left humeral head with cancellous screw in the proximal humeral shaft. IMPRESSION: No evidence of acute cardiopulmonary disease. Electronically Signed   By: Julian Hy M.D.   On: 10/28/2021 02:25   DG Pelvis 1-2 Views  Result Date: 10/28/2021 CLINICAL DATA:  Trauma EXAM: PELVIS - 1-2 VIEW COMPARISON:  None. FINDINGS: Intertrochanteric right hip fracture with varus angulation. Status post ORIF of the left hip.  Visualized bony pelvis is intact. Prior ORIF of the right proximal femur, incompletely visualized. IMPRESSION: Intertrochanteric right hip fracture. Electronically Signed   By: Julian Hy M.D.   On: 10/28/2021  02:26   DG CHEST PORT 1 VIEW  Result Date: 11/03/2021 CLINICAL DATA:  Chest and abdominal pain EXAM: PORTABLE CHEST 1 VIEW COMPARISON:  10/28/2021 FINDINGS: Single frontal view of the chest demonstrates a right chest wall port via subclavian approach tip overlying the superior vena cava. Cardiac silhouette is stable. No airspace disease, effusion, or pneumothorax. No acute bony abnormalities. IMPRESSION: 1. No acute intrathoracic process. Electronically Signed   By: Randa Ngo M.D.   On: 11/03/2021 17:27   DG Abd Portable 1V  Result Date: 11/03/2021 CLINICAL DATA:  Abdominal pain EXAM: PORTABLE ABDOMEN - 1 VIEW COMPARISON:  10/28/2021 FINDINGS: Supine frontal view of the abdomen and pelvis excludes the hemidiaphragms and right flank by collimation. No bowel obstruction or ileus.  Mild stool and retained contrast throughout the colon. No masses or abnormal calcifications. Postsurgical changes of the bilateral hips. IMPRESSION: 1. Unremarkable bowel gas pattern. 2. Mild fecal retention. Electronically Signed   By: Randa Ngo M.D.   On: 11/03/2021 17:27   DG Abd Portable 1V  Result Date: 10/28/2021 CLINICAL DATA:  Vomiting EXAM: PORTABLE ABDOMEN - 1 VIEW COMPARISON:  01/06/2015 FINDINGS: Slight gaseous distension of the stomach. Prominent gaseous distension of the cecum and ascending colon. Moderate volume of stool throughout the colon. No abnormally dilated loops of small bowel are seen. Partially visualized intertrochanteric fracture of the right femur as described on dedicated femur radiographs. IMPRESSION: 1. Prominent gaseous distension of the cecum and ascending colon. Moderate volume of stool throughout the colon. 2. No abnormally dilated loops of small bowel. Electronically Signed   By: Davina Poke D.O.   On: 10/28/2021 12:09   DG C-Arm 1-60 Min-No Report  Result Date: 10/28/2021 Fluoroscopy was utilized by the requesting physician.  No radiographic interpretation.   DG HIP PORT  UNILAT W OR W/O PELVIS 1V RIGHT  Result Date: 10/28/2021 CLINICAL DATA:  Fracture right hip EXAM: DG HIP (WITH OR WITHOUT PELVIS) 1V PORT RIGHT COMPARISON:  Study done earlier today FINDINGS: There is interval internal fixation of intertrochanteric fracture of proximal right femur with large caliber surgical screws. There is interval exchange of intramedullary rod with its tip in the proximal shaft. IMPRESSION: Status post internal fixation of intertrochanteric fracture of proximal right femur. Electronically Signed   By: Elmer Picker M.D.   On: 10/28/2021 18:13   DG FEMUR, MIN 2 VIEWS RIGHT  Result Date: 10/28/2021 CLINICAL DATA:  Hardware removal EXAM: RIGHT FEMUR 2 VIEWS COMPARISON:  10/28/2021 FINDINGS: Eight low resolution intraoperative spot views of the right femur. Total fluoroscopy time was 1 minutes 50 seconds, fluoroscopic dose of 13.21 mGy. The images demonstrate previous intramedullary rod and screws in the femur with subsequent removal. Placement of proximal femoral intramedullary rod and fixating screws for intertrochanteric fracture IMPRESSION: Intraoperative fluoroscopic assistance provided during hardware removal and subsequent internal fixation of right intertrochanteric fracture Electronically Signed   By: Donavan Foil M.D.   On: 10/28/2021 16:46   DG Femur Min 2 Views Right  Result Date: 10/28/2021 CLINICAL DATA:  Fall, leg shortened and externally rotated EXAM: RIGHT FEMUR 2 VIEWS COMPARISON:  None. FINDINGS: Intertrochanteric right hip fracture with foreshortening and varus angulation. Old right proximal femoral shaft fracture deformity, status post ORIF with IM nail and 2 proximal screws. Degenerative changes of the right knee. IMPRESSION: Intertrochanteric right hip fracture. Electronically Signed   By: Julian Hy M.D.   On: 10/28/2021 02:27   DG ESOPHAGUS W SINGLE CM (SOL OR THIN BA)  Result Date: 10/31/2021 CLINICAL DATA:  Dysphagia. EXAM: ESOPHAGUS/BARIUM  SWALLOW/TABLET STUDY TECHNIQUE: Single contrast examination was performed using thin liquid barium. This exam was performed by [name], and was supervised and interpreted by Dr. Nyoka Cowden. FLUOROSCOPY TIME:  Radiation Exposure Index (as provided by the fluoroscopic device): 4.5 mGy. COMPARISON:  NONE. FINDINGS: Swallowing: Appears normal. No vestibular penetration or aspiration seen. Pharynx: Unremarkable. Esophagus: Normal appearance. Esophageal motility: Within normal limits. Hiatal Hernia: None. Gastroesophageal reflux: None visualized. Ingested 22mm barium tablet: Passed normally. Other: None. IMPRESSION: No definite abnormality seen in the esophagus. Electronically Signed   By: Marijo Conception M.D.   On: 10/31/2021 09:21    DISCHARGE EXAMINATION: Vitals:   11/06/21 0546 11/06/21 1304 11/06/21 2230 11/07/21 0757  BP: 125/68  117/70 108/69 133/69  Pulse: 87 82 84 75  Resp: 14 18 18 17   Temp: (!) 97.4 F (36.3 C) 98.4 F (36.9 C) 97.8 F (36.6 C) 97.9 F (36.6 C)  TempSrc:  Oral Oral Oral  SpO2: 97% 97% 99% 99%  Weight:      Height:       General appearance: Awake alert.  In no distress Resp: Clear to auscultation bilaterally.  Normal effort Cardio: S1-S2 is normal regular.  No S3-S4.  No rubs murmurs or bruit GI: Abdomen is soft.  Nontender nondistended.  Bowel sounds are present normal.  No masses organomegaly   DISPOSITION: SNF  Discharge Instructions     Call MD for:  difficulty breathing, headache or visual disturbances   Complete by: As directed    Call MD for:  extreme fatigue   Complete by: As directed    Call MD for:  persistant dizziness or light-headedness   Complete by: As directed    Call MD for:  persistant nausea and vomiting   Complete by: As directed    Call MD for:  severe uncontrolled pain   Complete by: As directed    Call MD for:  temperature >100.4   Complete by: As directed    Discharge instructions   Complete by: As directed    Please review  instructions on the discharge summary.  You were cared for by a hospitalist during your hospital stay. If you have any questions about your discharge medications or the care you received while you were in the hospital after you are discharged, you can call the unit and asked to speak with the hospitalist on call if the hospitalist that took care of you is not available. Once you are discharged, your primary care physician will handle any further medical issues. Please note that NO REFILLS for any discharge medications will be authorized once you are discharged, as it is imperative that you return to your primary care physician (or establish a relationship with a primary care physician if you do not have one) for your aftercare needs so that they can reassess your need for medications and monitor your lab values. If you do not have a primary care physician, you can call [phone] for a physician referral.   Discharge wound care:   Complete by: As directed    As provided by orthopedics.   Increase activity slowly   Complete by: As directed           Allergies as of 11/07/2021       Reactions   Morphine And Related Anxiety   HALLUCINATIONS   Shellfish Allergy    Religious reasons   Iohexol     Desc: pt. broke out in red pimples 3 days after his ct scan w/contrast, ok with pre-meds   Other Other (See Comments)   Sulfa Antibiotics Hives        Medication List     STOP taking these medications    APPLE CIDER VINEGAR PO   ketoconazole 2 % cream Commonly known as: NIZORAL   multivitamin tablet   QC TUMERIC COMPLEX PO   rosuvastatin 5 MG tablet Commonly known as: CRESTOR   tamsulosin 0.4 MG Caps capsule Commonly known as: FLOMAX       TAKE these medications    Aflibercept 2 MG/0.05ML Soln Inject 0.5 mLs into the eye as directed. Take every 6 wks in eye doctor office.   alum & mag hydroxide-simeth 200-200-20 MG/5ML suspension Commonly known  as: MAALOX/MYLANTA Take 15  mLs by mouth every 6 (six) hours as needed for indigestion or heartburn.   amLODipine 5 MG tablet Commonly known as: NORVASC Take 5 mg by mouth daily.   apixaban 2.5 MG Tabs tablet Commonly known as: Eliquis Take 1 tablet (2.5 mg total) by mouth 2 (two) times daily.   bisacodyl 10 MG suppository Commonly known as: DULCOLAX Place 1 suppository (10 mg total) rectally daily as needed for moderate constipation.   calcium carbonate 500 MG chewable tablet Commonly known as: TUMS - dosed in mg elemental calcium Chew 1 tablet (200 mg of elemental calcium total) by mouth 3 (three) times daily as needed for indigestion or heartburn.   cholecalciferol 1000 units tablet Commonly known as: VITAMIN D Take 15,000 Units by mouth daily.   famotidine 10 MG tablet Commonly known as: PEPCID Take 1 tablet (10 mg total) by mouth 2 (two) times daily as needed for heartburn or indigestion.   feeding supplement Liqd Take 237 mLs by mouth 3 (three) times daily between meals.   (feeding supplement) PROSource Plus liquid Take 30 mLs by mouth 2 (two) times daily between meals.   lisinopril 20 MG tablet Commonly known as: ZESTRIL Take 20 mg by mouth daily.   magnesium oxide 400 MG tablet Commonly known as: MAG-OX Take 400 mg by mouth daily. "I take 500mg /day"   multivitamin-lutein Caps capsule Take 1 capsule by mouth daily.   pantoprazole 40 MG tablet Commonly known as: Protonix Take 1 tablet (40 mg total) by mouth 2 (two) times daily before a meal.   polyethylene glycol 17 g packet Commonly known as: MIRALAX / GLYCOLAX Take 17 g by mouth 3 (three) times daily.   PROBIOTIC FORMULA PO Take 1 tablet by mouth daily.   senna-docusate 8.6-50 MG tablet Commonly known as: Senokot-S Take 2 tablets by mouth 2 (two) times daily.   sucralfate 1 GM/10ML suspension Commonly known as: CARAFATE Take 20 mLs (2 g total) by mouth 3 (three) times daily after meals, bedtime and 0200.   traMADol 50 MG  tablet Commonly known as: ULTRAM Take 1-2 tablets (50-100 mg total) by mouth every 6 (six) hours as needed for moderate pain or severe pain (50 mg moderate pain, 100 mg severe pain).   vitamin C 1000 MG tablet Take 1,000 mg by mouth daily. Pt states he takes 6,000mg    Zinc 30 MG Tabs Take 1 tablet by mouth every morning. Pt states he takes 64mcg.               Discharge Care Instructions  (From admission, onward)           Start     Ordered   11/07/21 0000  Discharge wound care:       Comments: As provided by orthopedics.   11/07/21 1019              Follow-up Information     Haddix, Thomasene Lot, MD. Schedule an appointment as soon as possible for a visit in 2 week(s).   Specialty: Orthopedic Surgery Why: for wound check and repeat x-rays Contact information: [place] [address] [phone]         [name], MD. Schedule an appointment as soon as possible for a visit in 3 week(s).   Specialty: Family Medicine Contact information: [place] [address] [place], [place], DO Follow up.   Specialty:  Gastroenterology Why: office will call to schedule appointment Contact information: [place] [address] [phone]                 TOTAL DISCHARGE TIME: 3 minutes  [place]  [facility] Pager on [email or web address]  11/07/2021, 10:21 AM

## 2021-11-08 NOTE — Discharge Summary (Signed)
[facility]  Physician Discharge Summary   Patient ID: [name] [name] MRN: [id number] DOB/AGE: June 06, 1941 81 y.o.  Admit date: 10/28/2021 Discharge date:   11/08/2021   PCP: [name], MD  DISCHARGE DIAGNOSES:  Principal Problem:   Closed right hip fracture ([place]) Active Problems:   GERD (gastroesophageal reflux disease)   Odynophagia   Essential hypertension   Constipation   Hyponatremia   Acute postoperative anemia due to expected blood loss   RECOMMENDATIONS FOR OUTPATIENT FOLLOW UP: Please check CBC and basic metabolic panel in 1 week Patient needs to be followed up by Dr. Bryan Lemma with gastroenterology in the next few weeks. Follow-up with Dr. Doreatha Martin, orthopedic surgeon   Home Health: Going to SNF Equipment/Devices: None  CODE STATUS: Full code  DISCHARGE CONDITION: fair  Diet recommendation: Soft diet for now  INITIAL HISTORY: [name] [name] is a 81 y.o. male with medical history significant of HTN history of lymphoma and BCC who presents by EMS after a fall at home.  Found to have a hip fracture.  S/p repair on 2/10.  Developed significant odynophagia.  Seen by gastroenterology.  Initially given conservative treatment without any improvement.  Patient subsequently underwent EGD on 2/16 which showed severe esophagitis.   Symptoms are slowly improving.  He is able to tolerate soft.  Okay for discharge to skilled nursing facility.  Consultants: Gastroenterology.  Orthopedics   Procedures:   Cephalomedullary nailing of right intertrochanteric femur fracture Removal of hardware right femur  EGD 11/03/21 Impression:               - LA Grade D reflux esophagitis with no bleeding.                            Biopsied.                           - Normal stomach.                           - Normal examined duodenum.   HOSPITAL COURSE:    * Closed right hip fracture ([place])- (present on admission) Closed right hip fracture. S/p repair on 2/10. Pain  seems to be reasonably well controlled.   Orthopedics to transition from Lovenox to Eliquis at discharge.  Odynophagia Despite PPI Carafate and viscous lidocaine patient has not noticed any significant improvement in his symptoms.   Patient was reevaluated by gastroenterology.  Underwent EGD on 2/16 which showed severe reflux esophagitis.  Likely explains his symptoms.  Abdominal x-ray and chest x-ray without acute findings.  Continue PPI twice a day, Carafate.  Pepcid as needed.  Patient and family reassured.   Patient's diet was slowly advanced.  He is tolerating his soft diet well.  He is able to consume about 50% of his meals.  He was reassured.  He was told that his symptoms are gradually improved.  GERD (gastroesophageal reflux disease) Remains on Protonix and Carafate.  See discussion under odynophagia.  Essential hypertension- (present on admission) Blood pressure reasonably well controlled.  Continue amlodipine and lisinopril.  Constipation TSH 4.8, free T41.15.   He was placed on aggressive bowel regimen.  Had to be given a smog enema.  Finally having bowel movements.  Continue aggressive bowel regimen for now.  As his mobility improves his constipation is also resolved.  Hyponatremia Resolved  Acute postoperative anemia  due to expected blood loss Drop in hemoglobin from surgery and fracture.  Stable for the most part.  Has not required any blood transfusion   Patient feels well.  Looking forward to going to rehab.  He remains stable for discharge.  PERTINENT LABS:  The results of significant diagnostics from this hospitalization (including imaging, microbiology, ancillary and laboratory) are listed below for reference.      Labs:  COVID-19 Labs  Lab Results  Component Value Date   SARSCOV2NAA NEGATIVE 10/28/2021   [place] NEGATIVE 10/08/2019   [place] NEGATIVE 10/04/2019      Basic Metabolic Panel: Recent Labs  Lab 11/04/21 0406 11/07/21 0502  NA  136 136  K 3.8 3.8  CL 104 102  CO2 25 26  GLUCOSE 99 119*  BUN 12 16  CREATININE 1.05 1.08  CALCIUM 8.2* 8.5*  MG  --  2.1     CBC: Recent Labs  Lab 11/02/21 0335 11/04/21 0406 11/07/21 0502  WBC 7.8 9.0 8.7  HGB 9.6* 10.3* 10.7*  HCT 29.9* 31.8* 32.4*  MCV 88.2 88.3 88.3  PLT 207 271 339      IMAGING STUDIES DG Chest 1 View  Result Date: 10/28/2021 CLINICAL DATA:  Trauma EXAM: CHEST  1 VIEW COMPARISON:  CT chest dated 06/06/2012 FINDINGS: Mild left basilar atelectasis. Right lung is clear No pleural effusion or pneumothorax. The heart is normal in size. Right chest power port terminates in the upper right atrium. Mild deformity of the left humeral head with cancellous screw in the proximal humeral shaft. IMPRESSION: No evidence of acute cardiopulmonary disease. Electronically Signed   By: Julian Hy M.D.   On: 10/28/2021 02:25   DG Pelvis 1-2 Views  Result Date: 10/28/2021 CLINICAL DATA:  Trauma EXAM: PELVIS - 1-2 VIEW COMPARISON:  None. FINDINGS: Intertrochanteric right hip fracture with varus angulation. Status post ORIF of the left hip.  Visualized bony pelvis is intact. Prior ORIF of the right proximal femur, incompletely visualized. IMPRESSION: Intertrochanteric right hip fracture. Electronically Signed   By: Julian Hy M.D.   On: 10/28/2021 02:26   DG CHEST PORT 1 VIEW  Result Date: 11/03/2021 CLINICAL DATA:  Chest and abdominal pain EXAM: PORTABLE CHEST 1 VIEW COMPARISON:  10/28/2021 FINDINGS: Single frontal view of the chest demonstrates a right chest wall port via subclavian approach tip overlying the superior vena cava. Cardiac silhouette is stable. No airspace disease, effusion, or pneumothorax. No acute bony abnormalities. IMPRESSION: 1. No acute intrathoracic process. Electronically Signed   By: Randa Ngo M.D.   On: 11/03/2021 17:27   DG Abd Portable 1V  Result Date: 11/03/2021 CLINICAL DATA:  Abdominal pain EXAM: PORTABLE ABDOMEN - 1 VIEW  COMPARISON:  10/28/2021 FINDINGS: Supine frontal view of the abdomen and pelvis excludes the hemidiaphragms and right flank by collimation. No bowel obstruction or ileus. Mild stool and retained contrast throughout the colon. No masses or abnormal calcifications. Postsurgical changes of the bilateral hips. IMPRESSION: 1. Unremarkable bowel gas pattern. 2. Mild fecal retention. Electronically Signed   By: Randa Ngo M.D.   On: 11/03/2021 17:27   DG Abd Portable 1V  Result Date: 10/28/2021 CLINICAL DATA:  Vomiting EXAM: PORTABLE ABDOMEN - 1 VIEW COMPARISON:  01/06/2015 FINDINGS: Slight gaseous distension of the stomach. Prominent gaseous distension of the cecum and ascending colon. Moderate volume of stool throughout the colon. No abnormally dilated loops of small bowel are seen. Partially visualized intertrochanteric fracture of the right femur as described on dedicated femur radiographs.  IMPRESSION: 1. Prominent gaseous distension of the cecum and ascending colon. Moderate volume of stool throughout the colon. 2. No abnormally dilated loops of small bowel. Electronically Signed   By: Davina Poke D.O.   On: 10/28/2021 12:09   DG C-Arm 1-60 Min-No Report  Result Date: 10/28/2021 Fluoroscopy was utilized by the requesting physician.  No radiographic interpretation.   DG HIP PORT UNILAT W OR W/O PELVIS 1V RIGHT  Result Date: 10/28/2021 CLINICAL DATA:  Fracture right hip EXAM: DG HIP (WITH OR WITHOUT PELVIS) 1V PORT RIGHT COMPARISON:  Study done earlier today FINDINGS: There is interval internal fixation of intertrochanteric fracture of proximal right femur with large caliber surgical screws. There is interval exchange of intramedullary rod with its tip in the proximal shaft. IMPRESSION: Status post internal fixation of intertrochanteric fracture of proximal right femur. Electronically Signed   By: Elmer Picker M.D.   On: 10/28/2021 18:13   DG FEMUR, MIN 2 VIEWS RIGHT  Result Date:  10/28/2021 CLINICAL DATA:  Hardware removal EXAM: RIGHT FEMUR 2 VIEWS COMPARISON:  10/28/2021 FINDINGS: Eight low resolution intraoperative spot views of the right femur. Total fluoroscopy time was 1 minutes 50 seconds, fluoroscopic dose of 13.21 mGy. The images demonstrate previous intramedullary rod and screws in the femur with subsequent removal. Placement of proximal femoral intramedullary rod and fixating screws for intertrochanteric fracture IMPRESSION: Intraoperative fluoroscopic assistance provided during hardware removal and subsequent internal fixation of right intertrochanteric fracture Electronically Signed   By: Donavan Foil M.D.   On: 10/28/2021 16:46   DG Femur Min 2 Views Right  Result Date: 10/28/2021 CLINICAL DATA:  Fall, leg shortened and externally rotated EXAM: RIGHT FEMUR 2 VIEWS COMPARISON:  None. FINDINGS: Intertrochanteric right hip fracture with foreshortening and varus angulation. Old right proximal femoral shaft fracture deformity, status post ORIF with IM nail and 2 proximal screws. Degenerative changes of the right knee. IMPRESSION: Intertrochanteric right hip fracture. Electronically Signed   By: Julian Hy M.D.   On: 10/28/2021 02:27   DG ESOPHAGUS W SINGLE CM (SOL OR THIN BA)  Result Date: 10/31/2021 CLINICAL DATA:  Dysphagia. EXAM: ESOPHAGUS/BARIUM SWALLOW/TABLET STUDY TECHNIQUE: Single contrast examination was performed using thin liquid barium. This exam was performed by [name], and was supervised and interpreted by Dr. Nyoka Cowden. FLUOROSCOPY TIME:  Radiation Exposure Index (as provided by the fluoroscopic device): 4.5 mGy. COMPARISON:  NONE. FINDINGS: Swallowing: Appears normal. No vestibular penetration or aspiration seen. Pharynx: Unremarkable. Esophagus: Normal appearance. Esophageal motility: Within normal limits. Hiatal Hernia: None. Gastroesophageal reflux: None visualized. Ingested 28mm barium tablet: Passed normally. Other: None. IMPRESSION: No  definite abnormality seen in the esophagus. Electronically Signed   By: Marijo Conception M.D.   On: 10/31/2021 09:21    DISCHARGE EXAMINATION: Vitals:   11/07/21 1502 11/07/21 1954 11/08/21 0457 11/08/21 0810  BP: 107/63 114/64 124/67 122/67  Pulse: 81 80 72 72  Resp: 17 18 19 18   Temp: 97.8 F (36.6 C) 98 F (36.7 C) 97.8 F (36.6 C) (!) 97.4 F (36.3 C)  TempSrc: Oral Oral Oral Oral  SpO2: 100% 97% 98% 99%  Weight:      Height:       Awake alert.  No distress. Lungs are clear to auscultation bilaterally S1-S2 is normal regular Abdomen is soft.  Nontender nondistended  DISPOSITION: SNF  Discharge Instructions     Call MD for:  difficulty breathing, headache or visual disturbances   Complete by: As directed    Call MD  for:  extreme fatigue   Complete by: As directed    Call MD for:  persistant dizziness or light-headedness   Complete by: As directed    Call MD for:  persistant nausea and vomiting   Complete by: As directed    Call MD for:  severe uncontrolled pain   Complete by: As directed    Call MD for:  temperature >100.4   Complete by: As directed    Discharge instructions   Complete by: As directed    Please review instructions on the discharge summary.  You were cared for by a hospitalist during your hospital stay. If you have any questions about your discharge medications or the care you received while you were in the hospital after you are discharged, you can call the unit and asked to speak with the hospitalist on call if the hospitalist that took care of you is not available. Once you are discharged, your primary care physician will handle any further medical issues. Please note that NO REFILLS for any discharge medications will be authorized once you are discharged, as it is imperative that you return to your primary care physician (or establish a relationship with a primary care physician if you do not have one) for your aftercare needs so that they can reassess  your need for medications and monitor your lab values. If you do not have a primary care physician, you can call [phone] for a physician referral.   Discharge wound care:   Complete by: As directed    As provided by orthopedics.   Increase activity slowly   Complete by: As directed           Allergies as of 11/08/2021       Reactions   Morphine And Related Anxiety   HALLUCINATIONS   Shellfish Allergy    Religious reasons   Iohexol     Desc: pt. broke out in red pimples 3 days after his ct scan w/contrast, ok with pre-meds   Other Other (See Comments)   Sulfa Antibiotics Hives        Medication List     STOP taking these medications    APPLE CIDER VINEGAR PO   ketoconazole 2 % cream Commonly known as: NIZORAL   multivitamin tablet   QC TUMERIC COMPLEX PO   rosuvastatin 5 MG tablet Commonly known as: CRESTOR   tamsulosin 0.4 MG Caps capsule Commonly known as: FLOMAX       TAKE these medications    Aflibercept 2 MG/0.05ML Soln Inject 0.5 mLs into the eye as directed. Take every 6 wks in eye doctor office.   alum & mag hydroxide-simeth 200-200-20 MG/5ML suspension Commonly known as: MAALOX/MYLANTA Take 15 mLs by mouth every 6 (six) hours as needed for indigestion or heartburn.   amLODipine 5 MG tablet Commonly known as: NORVASC Take 5 mg by mouth daily.   apixaban 2.5 MG Tabs tablet Commonly known as: Eliquis Take 1 tablet (2.5 mg total) by mouth 2 (two) times daily.   bisacodyl 10 MG suppository Commonly known as: DULCOLAX Place 1 suppository (10 mg total) rectally daily as needed for moderate constipation.   calcium carbonate 500 MG chewable tablet Commonly known as: TUMS - dosed in mg elemental calcium Chew 1 tablet (200 mg of elemental calcium total) by mouth 3 (three) times daily as needed for indigestion or heartburn.   cholecalciferol 1000 units tablet Commonly known as: VITAMIN D Take 15,000 Units by mouth daily.   famotidine 10  MG  tablet Commonly known as: PEPCID Take 1 tablet (10 mg total) by mouth 2 (two) times daily as needed for heartburn or indigestion.   feeding supplement Liqd Take 237 mLs by mouth 3 (three) times daily between meals.   (feeding supplement) PROSource Plus liquid Take 30 mLs by mouth 2 (two) times daily between meals.   lisinopril 20 MG tablet Commonly known as: ZESTRIL Take 20 mg by mouth daily.   magnesium oxide 400 MG tablet Commonly known as: MAG-OX Take 400 mg by mouth daily. "I take 500mg /day"   multivitamin-lutein Caps capsule Take 1 capsule by mouth daily.   pantoprazole 40 MG tablet Commonly known as: Protonix Take 1 tablet (40 mg total) by mouth 2 (two) times daily before a meal.   polyethylene glycol 17 g packet Commonly known as: MIRALAX / GLYCOLAX Take 17 g by mouth 3 (three) times daily.   PROBIOTIC FORMULA PO Take 1 tablet by mouth daily.   senna-docusate 8.6-50 MG tablet Commonly known as: Senokot-S Take 2 tablets by mouth 2 (two) times daily.   sucralfate 1 GM/10ML suspension Commonly known as: CARAFATE Take 20 mLs (2 g total) by mouth 3 (three) times daily after meals, bedtime and 0200.   traMADol 50 MG tablet Commonly known as: ULTRAM Take 1-2 tablets (50-100 mg total) by mouth every 6 (six) hours as needed for moderate pain or severe pain (50 mg moderate pain, 100 mg severe pain).   vitamin C 1000 MG tablet Take 1,000 mg by mouth daily. Pt states he takes 6,000mg    Zinc 30 MG Tabs Take 1 tablet by mouth every morning. Pt states he takes 13mcg.               Discharge Care Instructions  (From admission, onward)           Start     Ordered   11/07/21 0000  Discharge wound care:       Comments: As provided by orthopedics.   11/07/21 1019              Follow-up Information     Haddix, Thomasene Lot, MD. Schedule an appointment as soon as possible for a visit in 2 week(s).   Specialty: Orthopedic Surgery Why: for wound check and  repeat x-rays Contact information: [place] [address] [phone]         [name], MD. Schedule an appointment as soon as possible for a visit in 3 week(s).   Specialty: Family Medicine Contact information: [place] [address] [place], [place], DO Follow up.   Specialty: Gastroenterology Why: office will call to schedule appointment Contact information: [place] [address] [phone]                 TOTAL DISCHARGE TIME: 63 minutes  [place]  [facility] Pager on [email or web address]  11/08/2021, 8:49 AM

## 2021-11-08 NOTE — Progress Notes (Signed)
Occupational Therapy Treatment Patient Details Name: [name] [name] MRN: [id number] DOB: 1941-09-01 Today's Date: 11/08/2021   History of present illness Pt is an 81 y/o male who presents s/p mechanical fall at home. He was found to have a periprosthetic intertrochanteric femur fracture and is now s/p cephalomedullary nailing on 10/28/2021. He is WBAT through the RLE. PMH significant for HTN, malignant neoplasm of pancreas, other malignant lymphomas, L femuir fracture s/p IM nail 09/2019.   OT comments  Pt with slow progression towards goals this session, feeling fatigued however agreeable to in-room ambulation. Pt BP 90/51 after walking to door, 112/59 after seated up in chair for ~2 mins. Pt initially dizzy, however has resolved quickly. Pt able to complete UE/LE exercises while sitting up in chair. RN notified of pt's soft BP. Pt presenting with impairments listed below, will follow acutely. Continue to recommend SNF at d/c.   Recommendations for follow up therapy are one component of a multi-disciplinary discharge planning process, led by the attending physician.  Recommendations may be updated based on patient status, additional functional criteria and insurance authorization.    Follow Up Recommendations  Skilled nursing-short term rehab (<3 hours/day)    Assistance Recommended at Discharge Intermittent Supervision/Assistance  Patient can return home with the following  A little help with walking and/or transfers;A lot of help with bathing/dressing/bathroom;Assistance with cooking/housework;Assist for transportation;Help with stairs or ramp for entrance   Equipment Recommendations  None recommended by OT;Other (comment)    Recommendations for Other Services PT consult    Precautions / Restrictions Precautions Precautions: Fall Restrictions Weight Bearing Restrictions: No RLE Weight Bearing: Weight bearing as tolerated       Mobility Bed Mobility               General  bed mobility comments: up in chair upon arrival    Transfers Overall transfer level: Needs assistance Equipment used: Rolling walker (2 wheels) Transfers: Sit to/from Stand Sit to Stand: Min guard           General transfer comment: increased time needed, difficulty placing R hand on RW     Balance Overall balance assessment: Needs assistance Sitting-balance support: Feet supported, No upper extremity supported Sitting balance-Leahy Scale: Good     Standing balance support: Bilateral upper extremity supported, Reliant on assistive device for balance Standing balance-Leahy Scale: Poor                             ADL either performed or assessed with clinical judgement   ADL                           Toilet Transfer: Min guard;Ambulation;Rolling walker (2 wheels);Grab bars;Comfort height toilet Toilet Transfer Details (indicate cue type and reason): simulated in room                Extremity/Trunk Assessment Upper Extremity Assessment Upper Extremity Assessment: Overall WFL for tasks assessed   Lower Extremity Assessment Lower Extremity Assessment: Defer to PT evaluation        Vision   Vision Assessment?: No apparent visual deficits   Perception Perception Perception: Not tested   Praxis Praxis Praxis: Not tested    Cognition Arousal/Alertness: Awake/alert Behavior During Therapy: WFL for tasks assessed/performed Overall Cognitive Status: Within Functional Limits for tasks assessed  Exercises Exercises: General Lower Extremity, General Upper Extremity General Exercises - Upper Extremity Shoulder Flexion: AROM, Both, 10 reps, Seated Shoulder Extension: AROM, Both, 10 reps, Seated General Exercises - Lower Extremity Ankle Circles/Pumps: AROM, Both, 10 reps Short Arc Quad: AROM, Both, 10 reps, Seated    Shoulder Instructions       General Comments BP 90/51 after  walking short distance, improved to 112/59 after sitting up in chair for a few mins    Pertinent Vitals/ Pain       Pain Assessment Pain Assessment: Faces Pain Score: 4  Faces Pain Scale: Hurts little more Pain Location: R hip Pain Descriptors / Indicators: Operative site guarding, Moaning, Grimacing, Sore Pain Intervention(s): Limited activity within patient's tolerance, Monitored during session, Premedicated before session, Repositioned  Home Living                                          Prior Functioning/Environment              Frequency  Min 2X/week        Progress Toward Goals  OT Goals(current goals can now be found in the care plan section)  Progress towards OT goals: Progressing toward goals  Acute Rehab OT Goals Patient Stated Goal: none stated OT Goal Formulation: With patient Time For Goal Achievement: 11/12/21 Potential to Achieve Goals: Good ADL Goals Pt Will Perform Upper Body Dressing: with supervision;sitting Pt Will Perform Lower Body Dressing: with mod assist;sitting/lateral leans;sit to/from stand Pt Will Transfer to Toilet: with min guard assist;bedside commode;squat pivot transfer  Plan Discharge plan remains appropriate;Frequency remains appropriate    Co-evaluation                 AM-PAC OT "6 Clicks" Daily Activity     Outcome Measure   Help from another person eating meals?: None Help from another person taking care of personal grooming?: A Little Help from another person toileting, which includes using toliet, bedpan, or urinal?: A Lot Help from another person bathing (including washing, rinsing, drying)?: A Lot Help from another person to put on and taking off regular upper body clothing?: A Lot Help from another person to put on and taking off regular lower body clothing?: A Lot 6 Click Score: 15    End of Session Equipment Utilized During Treatment: Gait belt;Rolling walker (2 wheels)  OT Visit  Diagnosis: Unsteadiness on feet (R26.81);Other abnormalities of gait and mobility (R26.89);Muscle weakness (generalized) (M62.81)   Activity Tolerance Patient tolerated treatment well   Patient Left in chair;with call bell/phone within reach;with chair alarm set   Nurse Communication Mobility status;Other (comment) (Rn notified of pt's soft BP)        Time: 5176-1607 OT Time Calculation (min): 28 min  Charges: OT General Charges $OT Visit: 1 Visit OT Treatments $Therapeutic Activity: 23-37 mins  Lynnda Child, OTD, OTR/L Acute Rehab [phone]) [phone] - [place] 11/08/2021, 11:14 AM

## 2021-11-08 NOTE — Plan of Care (Signed)
  Problem: Education: Goal: Knowledge of General Education information will improve Description: Including pain rating scale, medication(s)/side effects and non-pharmacologic comfort measures Outcome: Progressing   Problem: Clinical Measurements: Goal: Ability to maintain clinical measurements within normal limits will improve Outcome: Progressing   

## 2021-11-08 NOTE — Progress Notes (Signed)
Mobility Specialist: Progress Note   11/08/21 1758  Mobility  Activity Ambulated with assistance in room  Level of Assistance Contact guard assist, steadying assist  Assistive Device Front wheel walker  RLE Weight Bearing WBAT  Distance Ambulated (ft) 56 ft  Activity Response Tolerated well  $Mobility charge 1 Mobility   Pt c/o RLE pain during ambulation, no rating given. Pt otherwise asymptomatic. To BR per request, BM successful. Pt back to bed after walk with call bell at his side.   [facility] Haydan Mansouri Mobility Specialist Mobility Specialist 5 North: [phone] Mobility Specialist 6 North: [phone]

## 2021-11-08 NOTE — TOC Progression Note (Signed)
Transition of Care [facility]) - Progression Note    Patient Details  Name: [name] [name] MRN: [id number] Date of Birth: March 12, 1941  Transition of [facility]) CM/SW Contact  Joanne Chars, LCSW Phone Number: 11/08/2021, 9:43 AM  Clinical Narrative:   Per [facility], [facility] has informed her that they need to submit entirely new auth, which has been done.    Expected Discharge Plan: [place] Barriers to Discharge: Continued Medical Work up, Ship broker, SNF Pending bed offer  Expected Discharge Plan and Services Expected Discharge Plan: [place] In-house Referral: Clinical Social Work     Living arrangements for the past 2 months: Single Family Home Expected Discharge Date: 11/07/21                                     Social Determinants of Health (SDOH) Interventions    Readmission Risk Interventions No flowsheet data found.

## 2021-11-09 ENCOUNTER — Inpatient Hospital Stay (HOSPITAL_COMMUNITY): Payer: Medicare HMO

## 2021-11-09 ENCOUNTER — Telehealth: Payer: Self-pay | Admitting: General Surgery

## 2021-11-09 NOTE — Progress Notes (Signed)
Physical Therapy Treatment Patient Details Name: [name] [name] MRN: [id number] DOB: [birth year] Today's Date: 11/09/2021   History of Present Illness Pt is an 81 y/o male who presents s/p mechanical fall at home. He was found to have a periprosthetic intertrochanteric femur fracture and is now s/p cephalomedullary nailing on 10/28/2021. He is WBAT through the RLE. PMH significant for HTN, malignant neoplasm of pancreas, other malignant lymphomas, L femuir fracture s/p IM nail 09/2019.    PT Comments    Pt received supine and agreeable to session with fair tolerance for standing LE exercise for increased ROM and strength. Pt continues to require increased time at EOB prior to standing and between each set of exercise d/t pain and fatigue. No LOB during session, some posterior postural sway noted but pt able to self correct. Continued education re: sitting upright in bed post meals, continuation of functional mobility and benefits. Pt continues to benefit from skilled PT services to progress toward functional mobility goals.    Recommendations for follow up therapy are one component of a multi-disciplinary discharge planning process, led by the attending physician.  Recommendations may be updated based on patient status, additional functional criteria and insurance authorization.  Follow Up Recommendations  Skilled nursing-short term rehab (<3 hours/day)     Assistance Recommended at Discharge Frequent or constant Supervision/Assistance  Patient can return home with the following A lot of help with walking and/or transfers;Assist for transportation;Help with stairs or ramp for entrance   Equipment Recommendations  Rolling walker (2 wheels)    Recommendations for Other Services       Precautions / Restrictions Precautions Precautions: Fall Restrictions Weight Bearing Restrictions: No RLE Weight Bearing: Weight bearing as tolerated     Mobility  Bed Mobility Overal bed mobility:  Modified Independent, Needs Assistance Bed Mobility: Supine to Sit, Sit to Supine     Supine to sit: Modified independent (Device/Increase time) Sit to supine: Min assist   General bed mobility comments: min assist to bring RLE into bed    Transfers Overall transfer level: Needs assistance Equipment used: Rolling walker (2 wheels) Transfers: Sit to/from Stand Sit to Stand: Min guard           General transfer comment: increased time needed, difficulty placing R hand on RW    Ambulation/Gait                   Stairs             Wheelchair Mobility    Modified Rankin (Stroke Patients Only)       Balance Overall balance assessment: Needs assistance Sitting-balance support: Feet supported, No upper extremity supported Sitting balance-Leahy Scale: Good     Standing balance support: Bilateral upper extremity supported, Reliant on assistive device for balance Standing balance-Leahy Scale: Poor                              Cognition Arousal/Alertness: Awake/alert Behavior During Therapy: WFL for tasks assessed/performed Overall Cognitive Status: Within Functional Limits for tasks assessed                                          Exercises General Exercises - Lower Extremity Hip ABduction/ADduction: AROM, Right, Left, 10 reps, Standing (2 sets) Hip Flexion/Marching: AROM, Right, Left, 10 reps, Standing (2 sets) Heel Raises: AROM,  Both, 10 reps, Standing (2 sets) Mini-Sqauts: AROM, Both, 10 reps, Standing (2 sets) Other Exercises Other Exercises: Hip Extension alternating BLE x10 standing: 2 sets    General Comments        Pertinent Vitals/Pain Pain Assessment Pain Assessment: Faces Faces Pain Scale: Hurts a little bit Pain Location: R hip Pain Descriptors / Indicators: Operative site guarding, Moaning, Grimacing, Sore Pain Intervention(s): Limited activity within patient's tolerance, Monitored during session,  Repositioned    Home Living                          Prior Function            PT Goals (current goals can now be found in the care plan section) Acute Rehab PT Goals PT Goal Formulation: With patient/family Time For Goal Achievement: 11/12/21 Potential to Achieve Goals: Good    Frequency    Min 3X/week      PT Plan      Co-evaluation              AM-PAC PT "6 Clicks" Mobility   Outcome Measure  Help needed turning from your back to your side while in a flat bed without using bedrails?: A Little Help needed moving from lying on your back to sitting on the side of a flat bed without using bedrails?: A Little Help needed moving to and from a bed to a chair (including a wheelchair)?: A Little Help needed standing up from a chair using your arms (e.g., wheelchair or bedside chair)?: A Little Help needed to walk in hospital room?: A Little Help needed climbing 3-5 steps with a railing? : Total 6 Click Score: 16    End of Session Equipment Utilized During Treatment: Gait belt Activity Tolerance: Patient tolerated treatment well;Patient limited by fatigue Patient left: in bed;with bed alarm set;with call bell/phone within reach Nurse Communication: Mobility status PT Visit Diagnosis: Unsteadiness on feet (R26.81);Pain Pain - Right/Left: Right Pain - part of body: Hip     Time: 8638-1771 PT Time Calculation (min) (ACUTE ONLY): 23 min  Charges:  $Therapeutic Exercise: 23-37 mins                     Lakedra Washington R. PTA Acute Rehabilitation Services Office: [place] 11/09/2021, 4:43 PM

## 2021-11-09 NOTE — Progress Notes (Signed)
PROGRESS NOTE  [name] [name]  DOB: [birth year]  PCP: [name], MD YCX:[id number]  DOA: 10/28/2021  LOS: 12 days  Hospital Day: 13  Brief narrative: [name] [name] is a 81 y.o. male with PMH significant for HTN, lymphomas,, BCC of skin. Patient was brought to the ED on 10/28/2021 by EMS after a fall at home. Imaging showed right hip fracture. Admitted to hospital service 2/10, underwent ORIF. Postprocedure, patient developed significant odynophagia.  Seen by gastroenterology. EGD on 2/16 showed severe esophagitis Currently pending placement to SNF  Subjective: Patient was seen and examined this morning.  Pleasant elderly Caucasian male.  Lying on bed.  Not in distress.  No new symptoms.  Feels better.  Principal Problem:   Closed right hip fracture (HCC) Active Problems:   Essential hypertension   Constipation   GERD (gastroesophageal reflux disease)   Hyponatremia   Odynophagia   Acute postoperative anemia due to expected blood loss   Assessment and Plan: Closed right hip fracture   -Presented after mechanical fall at home.  Imaging showed right hip fracture.  Underwent ORIF on 2/10.   -Reasonably pain controlled.   -Currently due to prophylaxis Lovenox.  Per orthopedic recommendation, to switch to Eliquis at discharge for DVT prophylaxis. -Continue vitamin D supplementation.  Odynophagia -Patient complained of odynophagia postprocedure.  Initially tried on medical management with viscous lidocaine, Carafate and PPI but no significant improvement was achieved.   -Seen by gastroenterology.  Underwent EGD on 2/16 which showed severe reflux esophagitis.  -Per GI recommendation, patient will use Protonix 40 mg twice daily for 8 weeks, sucralfate suspension 1 g p.o. 4 times daily for 4 weeks. Continue viscous Lidocaine AC and HS until symptoms start to improve and able to increase diet. -Currently able to tolerate soft diet. -To follow-up with GI as an outpatient to  repeat upper endoscopy in 8 weeks to check healing.  Essential hypertension  -Blood pressure controlled on amlodipine and lisinopril.     Constipation -Continue aggressive bowel regimen.  As his mobility improves his constipation is also resolved.  Acute postoperative anemia due to expected blood loss -Hemoglobin currently stable over 10  Mobility: Encourage ambulation Goals of care   Code Status: Full Code    Nutritional status:  Body mass index is 25.84 kg/m.  Nutrition Problem: Increased nutrient needs Etiology: hip fracture, post-op healing Signs/Symptoms: estimated needs Diet:  Diet Order             DIET SOFT Room service appropriate? Yes; Fluid consistency: Thin  Diet effective now                   DVT prophylaxis:  enoxaparin (LOVENOX) injection 40 mg Start: 11/04/21 0800 SCDs Start: 10/28/21 1808 SCDs Start: 10/28/21 0354   Antimicrobials: None Fluid: None Consultants: Orthopedics Family Communication: None at bedside  Status is: Inpatient  Continue in-hospital care because: Pending SNF placement Level of care: Med-Surg   Dispo: The patient is from: Home              Anticipated d/c is to: SNF              Patient currently is not medically stable to d/c.   Difficult to place patient No     Infusions:    Scheduled Meds:  (feeding supplement) PROSource Plus  30 mL Oral BID BM   amLODipine  5 mg Oral Daily   Chlorhexidine Gluconate Cloth  6 each Topical Daily   cholecalciferol  10,000 Units Oral Daily   enoxaparin (LOVENOX) injection  40 mg Subcutaneous Q24H   feeding supplement  237 mL Oral TID BM   lisinopril  20 mg Oral Daily   magnesium oxide  400 mg Oral Daily   multivitamin with minerals  1 tablet Oral Daily   pantoprazole (PROTONIX) IV  40 mg Intravenous Q12H   polyethylene glycol  17 g Oral TID   senna-docusate  2 tablet Oral BID   sodium chloride flush  10-40 mL Intracatheter Q12H   sorbitol, milk of mag, mineral oil,  glycerin (SMOG) enema  960 mL Rectal Once   sucralfate  2 g Oral TID PC,HS,0200    PRN meds: alum & mag hydroxide-simeth, bisacodyl, calcium carbonate, famotidine, ondansetron **OR** ondansetron (ZOFRAN) IV, phenol, sodium chloride flush, sodium phosphate, traMADol   Antimicrobials: Anti-infectives (From admission, onward)    Start     Dose/Rate Route Frequency Ordered Stop   10/28/21 2200  ceFAZolin (ANCEF) IVPB 2g/100 mL premix        2 g 200 mL/hr over 30 Minutes Intravenous Every 8 hours 10/28/21 1807 10/29/21 1316   10/28/21 1245  ceFAZolin (ANCEF) IVPB 2g/100 mL premix        2 g 200 mL/hr over 30 Minutes Intravenous On call to O.R. 10/28/21 1218 10/28/21 1501       Objective: Vitals:   11/09/21 0433 11/09/21 0750  BP: (!) 149/71 123/64  Pulse: 78 79  Resp: 17 16  Temp: 97.9 F (36.6 C) (!) 97.4 F (36.3 C)  SpO2: 96% 99%    Intake/Output Summary (Last 24 hours) at 11/09/2021 1332 Last data filed at 11/09/2021 0900 Gross per 24 hour  Intake 360 ml  Output 350 ml  Net 10 ml   Filed Weights   10/28/21 1239 10/28/21 1742 11/03/21 0903  Weight: 79.4 kg 83.8 kg 79.4 kg   Weight change:  Body mass index is 25.84 kg/m.   Physical Exam: General exam: Pleasant, elderly Caucasian male.  Not in physical distress Skin: No rashes, lesions or ulcers. HEENT: Atraumatic, normocephalic, no obvious bleeding Lungs: Clear to auscultation bilaterally CVS: Regular rate and rhythm, no murmur GI/Abd soft, nontender, nondistended, bowel sound present CNS: Alert, awake, oriented x3 Psychiatry: Mood appropriate Extremities: No pedal edema, no calf tenderness  Data Review: I have personally reviewed the laboratory data and studies available.  F/u labs not required Unresulted Labs (From admission, onward)    None       Signed, Terrilee Croak, MD [facility] 11/09/2021

## 2021-11-09 NOTE — Progress Notes (Signed)
PT/OT working with pt.  Come back for [facility]

## 2021-11-09 NOTE — Telephone Encounter (Signed)
-----   Message from [place], DO sent at 11/03/2021 10:30 AM EST ----- Patient will need outpatient f/u appt in about 4 weeks and outpatient EGD in about 8 weeks. Hopefully to be discharged in the next few days, so ok to call and arrange on Monday. Thanks.

## 2021-11-09 NOTE — Progress Notes (Signed)
Orthopaedic Trauma Progress Note  SUBJECTIVE: Patient doing okay today, pain has been manageable.  Has slowly been able to put more weight on the leg over the last week.  Notes that the bruising over the hip and outer thighs were significant than the pain itself. No chest pain. No SOB. No nausea/vomiting. No other complaints.  Is hopeful to be discharged to rehab facility today.  OBJECTIVE:  Vitals:   11/09/21 0433 11/09/21 0750  BP: (!) 149/71 123/64  Pulse: 78 79  Resp: 17 16  Temp: 97.9 F (36.6 C) (!) 97.4 F (36.3 C)  SpO2: 96% 99%    General: Sitting up in bed, no acute distress Respiratory: No increased work of breathing.  Right lower extremity: Incisions clean, dry, intact.  Significant bruising to the right hip and lateral thigh.  Tenderness with palpation over these areas.  Nontender in the knee or throughout the lower leg.  Compartments soft and compressible. Tolerates gentle knee range of motion.  Ankle dorsiflexion/plantarflexion is intact without pain.  Endorses sensation throughout extremity.  Neurovascularly intact  IMAGING: Stable post op imaging.   LABS: No results found for this or any previous visit (from the past 24 hour(s)).  ASSESSMENT: [name] [name] is a 81 y.o. male, 6 Days Post-Op s/p   CV/Blood loss: Acute blood loss anemia, Hgb 10.7 this a.m. Hemodynamically stable  PLAN: Weightbearing: WBAT RLE ROM: Unrestricted hip and knee motion as tolerated Incisional and dressing care: Incisions may be left open to air Showering: Okay for showering, incisions may get wet Orthopedic device(s): None  Pain management: Continue current regimen VTE prophylaxis: Lovenox, SCDs ID:  Ancef 2gm post op completed Foley/Lines: No foley, KVO IVFs Impediments to Fracture Healing: Vitamin D level 66, no additional supplementation needed. Dispo: Therapies as tolerated, PT/OT recommending SNF.  Patient stable for discharge from Ortho standpoint once cleared by medicine team  and therapies.  Repeat imaging right hip ordered for today  D/C recommendations: - Tramadol for pain control - Eliquis for DVT prophylaxis - Continue home dose Vit D supplementation  Follow - up plan: 2 weeks after discharge for repeat x-rays and wound check   Contact information:  Katha Hamming MD, [name] PA-C. After hours and holidays please check [email or web address] for group call information for [facility]   Gwinda Passe, PA-C [phone] (office) [email or web address]

## 2021-11-09 NOTE — Progress Notes (Signed)
Mobility Specialist Progress Note    11/09/21 1111  Mobility  Bed Position Chair  Activity Ambulated with assistance in hallway  Level of Assistance +2 (takes two people) (chair follow)  Assistive Device Front wheel walker  RLE Weight Bearing WBAT  Distance Ambulated (ft) 70 ft  Activity Response Tolerated fair  $Mobility charge 1 Mobility   Pt received in bed and agreeable. No complaints on walk. Pushed pt back to room in chair. Left with call bell in reach.   [facility] Mobility Specialist  M.S. 5N: [phone]

## 2021-11-09 NOTE — Anesthesia Postprocedure Evaluation (Signed)
Anesthesia Post Note  Patient: [name] [name]  Procedure(s) Performed: ESOPHAGOGASTRODUODENOSCOPY (EGD) WITH PROPOFOL BIOPSY     Patient location during evaluation: PACU Anesthesia Type: MAC Level of consciousness: awake and alert Pain management: pain level controlled Vital Signs Assessment: post-procedure vital signs reviewed and stable Respiratory status: spontaneous breathing, nonlabored ventilation, respiratory function stable and patient connected to nasal cannula oxygen Cardiovascular status: stable and blood pressure returned to baseline Postop Assessment: no apparent nausea or vomiting Anesthetic complications: no   No notable events documented.  Last Vitals:  Vitals:   11/09/21 1534 11/09/21 2102  BP: 109/62 113/64  Pulse: 90 83  Resp: 16 17  Temp: 36.5 C 36.8 C  SpO2: 99% 97%    Last Pain:  Vitals:   11/09/21 1620  TempSrc:   PainSc: 5                  Tiajuana Amass

## 2021-11-09 NOTE — TOC Progression Note (Signed)
Transition of Care [facility]) - Progression Note    Patient Details  Name: AUTHUR CUBIT MRN: [id number] Date of Birth: 10-07-1940  Transition of Care [facility] CM/SW Contact  Joanne Chars, LCSW Phone Number: 11/09/2021, 11:47 AM  Clinical Narrative:  Per [facility] at [place], Josem Kaufmann still pending.      Expected Discharge Plan: [place] Barriers to Discharge: Continued Medical Work up, Ship broker, SNF Pending bed offer  Expected Discharge Plan and Services Expected Discharge Plan: [place] In-house Referral: Clinical Social Work     Living arrangements for the past 2 months: Single Family Home Expected Discharge Date: 11/07/21                                     Social Determinants of Health (SDOH) Interventions    Readmission Risk Interventions No flowsheet data found.

## 2021-11-09 NOTE — Telephone Encounter (Signed)
Left a voicemail for the patient to call and schedule a follow up appointment and an egd at [facility]

## 2021-11-10 ENCOUNTER — Ambulatory Visit: Payer: Medicare HMO | Admitting: Dermatology

## 2021-11-10 DIAGNOSIS — Z9181 History of falling: Secondary | ICD-10-CM | POA: Diagnosis not present

## 2021-11-10 DIAGNOSIS — N182 Chronic kidney disease, stage 2 (mild): Secondary | ICD-10-CM | POA: Diagnosis not present

## 2021-11-10 DIAGNOSIS — M6281 Muscle weakness (generalized): Secondary | ICD-10-CM | POA: Diagnosis not present

## 2021-11-10 DIAGNOSIS — C8590 Non-Hodgkin lymphoma, unspecified, unspecified site: Secondary | ICD-10-CM | POA: Diagnosis not present

## 2021-11-10 DIAGNOSIS — K219 Gastro-esophageal reflux disease without esophagitis: Secondary | ICD-10-CM | POA: Diagnosis not present

## 2021-11-10 DIAGNOSIS — Z7689 Persons encountering health services in other specified circumstances: Secondary | ICD-10-CM | POA: Diagnosis not present

## 2021-11-10 DIAGNOSIS — R2689 Other abnormalities of gait and mobility: Secondary | ICD-10-CM | POA: Diagnosis not present

## 2021-11-10 DIAGNOSIS — E871 Hypo-osmolality and hyponatremia: Secondary | ICD-10-CM | POA: Diagnosis not present

## 2021-11-10 DIAGNOSIS — R1310 Dysphagia, unspecified: Secondary | ICD-10-CM | POA: Diagnosis not present

## 2021-11-10 DIAGNOSIS — R531 Weakness: Secondary | ICD-10-CM | POA: Diagnosis not present

## 2021-11-10 DIAGNOSIS — S72141D Displaced intertrochanteric fracture of right femur, subsequent encounter for closed fracture with routine healing: Secondary | ICD-10-CM | POA: Diagnosis not present

## 2021-11-10 DIAGNOSIS — K5900 Constipation, unspecified: Secondary | ICD-10-CM | POA: Diagnosis not present

## 2021-11-10 DIAGNOSIS — D649 Anemia, unspecified: Secondary | ICD-10-CM | POA: Diagnosis not present

## 2021-11-10 DIAGNOSIS — E559 Vitamin D deficiency, unspecified: Secondary | ICD-10-CM | POA: Diagnosis not present

## 2021-11-10 DIAGNOSIS — S72001A Fracture of unspecified part of neck of right femur, initial encounter for closed fracture: Secondary | ICD-10-CM | POA: Diagnosis not present

## 2021-11-10 DIAGNOSIS — I10 Essential (primary) hypertension: Secondary | ICD-10-CM | POA: Diagnosis not present

## 2021-11-10 DIAGNOSIS — D696 Thrombocytopenia, unspecified: Secondary | ICD-10-CM | POA: Diagnosis not present

## 2021-11-10 DIAGNOSIS — Z743 Need for continuous supervision: Secondary | ICD-10-CM | POA: Diagnosis not present

## 2021-11-10 DIAGNOSIS — M79604 Pain in right leg: Secondary | ICD-10-CM | POA: Diagnosis not present

## 2021-11-10 DIAGNOSIS — S72002D Fracture of unspecified part of neck of left femur, subsequent encounter for closed fracture with routine healing: Secondary | ICD-10-CM | POA: Diagnosis not present

## 2021-11-10 MED ORDER — HEPARIN SOD (PORK) LOCK FLUSH 100 UNIT/ML IV SOLN
500.0000 [IU] | Freq: Once | INTRAVENOUS | Status: AC
  Administered 2021-11-10: 500 [IU] via INTRAVENOUS
  Filled 2021-11-10 (×2): qty 5

## 2021-11-10 NOTE — TOC Progression Note (Signed)
Transition of Care [facility]) - Progression Note    Patient Details  Name: [name] [name] MRN: [id number] Date of Birth: 03-Nov-1940  Transition of Care [facility]) CM/SW Contact  Joanne Chars, LCSW Phone Number: 11/10/2021, 11:29 AM  Clinical Narrative:   Per [facility], Josem Kaufmann continues to be pending with [employer].    Expected Discharge Plan: [place] Barriers to Discharge: Continued Medical Work up, Ship broker, SNF Pending bed offer  Expected Discharge Plan and Services Expected Discharge Plan: [place] In-house Referral: Clinical Social Work     Living arrangements for the past 2 months: Single Family Home Expected Discharge Date: 11/07/21                                     Social Determinants of Health (SDOH) Interventions    Readmission Risk Interventions No flowsheet data found.

## 2021-11-10 NOTE — Discharge Summary (Signed)
Physician Discharge Summary  [name] [name] LOV:[id number] DOB: 09/02/1941 DOA: 10/28/2021  PCP: [name], MD  Admit date: 10/28/2021 Discharge date: 11/10/2021  Admitted From: home Discharge disposition: SNF  Brief narrative: [name] [name] is a 81 y.o. male with PMH significant for HTN, lymphomas,, BCC of skin. Patient was brought to the ED on 10/28/2021 by EMS after a fall at home. Imaging showed right hip fracture. Admitted to hospital service 2/10, underwent ORIF. Postprocedure, patient developed significant odynophagia.  Seen by gastroenterology. EGD on 2/16 showed severe esophagitis Currently pending placement to SNF  Subjective: Patient was seen and examined this morning. Pleasant male.  Lying down in bed.  Not in distress.  Waiting for SNF.  Principal Problem:   Closed right hip fracture (HCC) Active Problems:   Essential hypertension   Constipation   GERD (gastroesophageal reflux disease)   Hyponatremia   Odynophagia   Acute postoperative anemia due to expected blood loss   Hospital course: Closed right hip fracture   -Presented after mechanical fall at home. Imaging showed right hip fracture. Underwent ORIF on 2/10.   -Reasonably pain controlled.   -Currently due to prophylaxis Lovenox. Per orthopedic recommendation, to switch to Eliquis at discharge for DVT prophylaxis. -Continue vitamin D supplementation.  Odynophagia -Patient complained of odynophagia postprocedure. Initially tried on medical management with viscous lidocaine, Carafate and PPI but no significant improvement was achieved.   -Seen by gastroenterology.  Underwent EGD on 2/16 which showed severe reflux esophagitis.  -Per GI recommendation, patient will use Protonix 40 mg twice daily for 8 weeks, sucralfate suspension 1 g p.o. 4 times daily for 4 weeks. Continue viscous Lidocaine AC and HS until symptoms start to improve and able to increase diet. -Currently able to tolerate soft diet. -To  follow-up with GI as an outpatient to repeat upper endoscopy in 8 weeks to check healing.  Essential hypertension  -Blood pressure controlled on amlodipine and lisinopril.     Constipation -Continue aggressive bowel regimen.  As his mobility improves his constipation is also resolved.  Acute postoperative anemia due to expected blood loss -Hemoglobin currently stable over 10  Mobility: Encourage ambulation Goals of care   Code Status: Full Code    Wounds:  - Incision (Closed) 10/06/19 Leg Left (Active)  Date First Assessed/Time First Assessed: 10/06/19 1331   Location: Leg  Location Orientation: Left    Assessments 10/06/2019  1:57 PM 10/11/2019  9:46 AM  Dressing Type Gauze (Comment);Tape dressing;Compression wrap Gauze (Comment);Abdominal pads  Dressing Clean;Dry;Intact Dry;Clean;Intact  Dressing Change Frequency -- Daily  Site / Wound Assessment Dressing in place / Unable to assess Clean;Pink  Drainage Amount None None     No Linked orders to display     Incision (Closed) 10/28/21 Leg Right (Active)  Date First Assessed/Time First Assessed: 10/28/21 1602   Location: Leg  Location Orientation: Right    Assessments 10/28/2021  4:47 PM 11/09/2021  9:15 PM  Dressing Type Compression wrap Silver hydrofiber  Dressing Dry;Clean;Intact Clean, Dry, Intact  Drainage Amount -- None     No Linked orders to display    Discharge Exam:   Vitals:   11/09/21 2102 11/10/21 0431 11/10/21 0736 11/10/21 1447  BP: 113/64 136/72 129/67 (!) 115/56  Pulse: 83 81 78 86  Resp: 17 17 16 14   Temp: 98.3 F (36.8 C) 97.8 F (36.6 C) 97.8 F (36.6 C) (!) 97.5 F (36.4 C)  TempSrc:   Oral Oral  SpO2: 97% 96% 98% 100%  Weight:      Height:        Body mass index is 25.84 kg/m.  General exam: Pleasant, elderly Caucasian male.  Not in visible distress Skin: No rashes, lesions or ulcers. HEENT: Atraumatic, normocephalic, no obvious bleeding Lungs: Clear to auscultation bilaterally CVS:  Regular rate and rhythm, no murmur GI/Abd soft, nontender, nondistended, bowel sound present CNS: Alert, awake, oriented x3 Psychiatry: Mood appropriate Extremities: No pedal edema, no calf tenderness  Follow ups:    Follow-up Information     Haddix, Thomasene Lot, MD. Schedule an appointment as soon as possible for a visit in 2 week(s).   Specialty: Orthopedic Surgery Why: for wound check and repeat x-rays Contact information: [place] [address] [phone]         [name], MD. Schedule an appointment as soon as possible for a visit in 3 week(s).   Specialty: Family Medicine Contact information: [place] [address] [place], [place], DO Follow up.   Specialty: Gastroenterology Why: office will call to schedule appointment Contact information: [place] [address] [phone]         [name], MD Follow up.   Specialty: Family Medicine Contact information: [place] [address] [phone]                 Discharge Instructions:   Discharge Instructions     Call MD for:  difficulty breathing, headache or visual disturbances   Complete by: As directed    Call MD for:  extreme fatigue   Complete by: As directed    Call MD for:  persistant dizziness or light-headedness   Complete by: As directed    Call MD for:  persistant nausea and vomiting   Complete by: As directed    Call MD for:  severe uncontrolled pain   Complete by: As directed    Call MD for:  temperature >100.4   Complete by: As directed    Discharge instructions   Complete by: As directed    Please review instructions on the discharge summary.  You were cared for by a hospitalist during your hospital stay. If you have any questions about your discharge medications or the care you received while you were in the hospital after you are  discharged, you can call the unit and asked to speak with the hospitalist on call if the hospitalist that took care of you is not available. Once you are discharged, your primary care physician will handle any further medical issues. Please note that NO REFILLS for any discharge medications will be authorized once you are discharged, as it is imperative that you return to your primary care physician (or establish a relationship with a primary care physician if you do not have one) for your aftercare needs so that they can reassess your need for medications and monitor your lab values. If you do not have a primary care physician, you can call [phone] for a physician referral.   Discharge wound care:   Complete by: As directed    As provided by orthopedics.   Increase activity slowly   Complete by: As directed        Discharge Medications:   Allergies as of 11/10/2021       Reactions   Morphine And Related Anxiety   HALLUCINATIONS   Shellfish Allergy  Religious reasons   Iohexol     Desc: pt. broke out in red pimples 3 days after his ct scan w/contrast, ok with pre-meds   Other Other (See Comments)   Sulfa Antibiotics Hives        Medication List     STOP taking these medications    APPLE CIDER VINEGAR PO   ketoconazole 2 % cream Commonly known as: NIZORAL   multivitamin tablet   QC TUMERIC COMPLEX PO   rosuvastatin 5 MG tablet Commonly known as: CRESTOR   tamsulosin 0.4 MG Caps capsule Commonly known as: FLOMAX       TAKE these medications    Aflibercept 2 MG/0.05ML Soln Inject 0.5 mLs into the eye as directed. Take every 6 wks in eye doctor office.   alum & mag hydroxide-simeth 200-200-20 MG/5ML suspension Commonly known as: MAALOX/MYLANTA Take 15 mLs by mouth every 6 (six) hours as needed for indigestion or heartburn.   amLODipine 5 MG tablet Commonly known as: NORVASC Take 5 mg by mouth daily.   apixaban 2.5 MG Tabs tablet Commonly known as:  Eliquis Take 1 tablet (2.5 mg total) by mouth 2 (two) times daily.   bisacodyl 10 MG suppository Commonly known as: DULCOLAX Place 1 suppository (10 mg total) rectally daily as needed for moderate constipation.   calcium carbonate 500 MG chewable tablet Commonly known as: TUMS - dosed in mg elemental calcium Chew 1 tablet (200 mg of elemental calcium total) by mouth 3 (three) times daily as needed for indigestion or heartburn.   cholecalciferol 1000 units tablet Commonly known as: VITAMIN D Take 15,000 Units by mouth daily.   famotidine 10 MG tablet Commonly known as: PEPCID Take 1 tablet (10 mg total) by mouth 2 (two) times daily as needed for heartburn or indigestion.   feeding supplement Liqd Take 237 mLs by mouth 3 (three) times daily between meals.   (feeding supplement) PROSource Plus liquid Take 30 mLs by mouth 2 (two) times daily between meals.   lisinopril 20 MG tablet Commonly known as: ZESTRIL Take 20 mg by mouth daily.   magnesium oxide 400 MG tablet Commonly known as: MAG-OX Take 400 mg by mouth daily. "I take 500mg /day"   multivitamin-lutein Caps capsule Take 1 capsule by mouth daily.   pantoprazole 40 MG tablet Commonly known as: Protonix Take 1 tablet (40 mg total) by mouth 2 (two) times daily before a meal.   polyethylene glycol 17 g packet Commonly known as: MIRALAX / GLYCOLAX Take 17 g by mouth 3 (three) times daily.   PROBIOTIC FORMULA PO Take 1 tablet by mouth daily.   senna-docusate 8.6-50 MG tablet Commonly known as: Senokot-S Take 2 tablets by mouth 2 (two) times daily.   sucralfate 1 GM/10ML suspension Commonly known as: CARAFATE Take 20 mLs (2 g total) by mouth 3 (three) times daily after meals, bedtime and 0200.   traMADol 50 MG tablet Commonly known as: ULTRAM Take 1-2 tablets (50-100 mg total) by mouth every 6 (six) hours as needed for moderate pain or severe pain (50 mg moderate pain, 100 mg severe pain).   vitamin C 1000 MG  tablet Take 1,000 mg by mouth daily. Pt states he takes 6,000mg    Zinc 30 MG Tabs Take 1 tablet by mouth every morning. Pt states he takes 52mcg.               Discharge Care Instructions  (From admission, onward)  Start     Ordered   11/07/21 0000  Discharge wound care:       Comments: As provided by orthopedics.   11/07/21 1019             The results of significant diagnostics from this hospitalization (including imaging, microbiology, ancillary and laboratory) are listed below for reference.    Procedures and Diagnostic Studies:   DG Chest 1 View  Result Date: 10/28/2021 CLINICAL DATA:  Trauma EXAM: CHEST  1 VIEW COMPARISON:  CT chest dated 06/06/2012 FINDINGS: Mild left basilar atelectasis. Right lung is clear No pleural effusion or pneumothorax. The heart is normal in size. Right chest power port terminates in the upper right atrium. Mild deformity of the left humeral head with cancellous screw in the proximal humeral shaft. IMPRESSION: No evidence of acute cardiopulmonary disease. Electronically Signed   By: Julian Hy M.D.   On: 10/28/2021 02:25   DG Pelvis 1-2 Views  Result Date: 10/28/2021 CLINICAL DATA:  Trauma EXAM: PELVIS - 1-2 VIEW COMPARISON:  None. FINDINGS: Intertrochanteric right hip fracture with varus angulation. Status post ORIF of the left hip.  Visualized bony pelvis is intact. Prior ORIF of the right proximal femur, incompletely visualized. IMPRESSION: Intertrochanteric right hip fracture. Electronically Signed   By: Julian Hy M.D.   On: 10/28/2021 02:26   DG Abd Portable 1V  Result Date: 10/28/2021 CLINICAL DATA:  Vomiting EXAM: PORTABLE ABDOMEN - 1 VIEW COMPARISON:  01/06/2015 FINDINGS: Slight gaseous distension of the stomach. Prominent gaseous distension of the cecum and ascending colon. Moderate volume of stool throughout the colon. No abnormally dilated loops of small bowel are seen. Partially visualized  intertrochanteric fracture of the right femur as described on dedicated femur radiographs. IMPRESSION: 1. Prominent gaseous distension of the cecum and ascending colon. Moderate volume of stool throughout the colon. 2. No abnormally dilated loops of small bowel. Electronically Signed   By: Davina Poke D.O.   On: 10/28/2021 12:09   DG C-Arm 1-60 Min-No Report  Result Date: 10/28/2021 Fluoroscopy was utilized by the requesting physician.  No radiographic interpretation.   DG HIP PORT UNILAT W OR W/O PELVIS 1V RIGHT  Result Date: 10/28/2021 CLINICAL DATA:  Fracture right hip EXAM: DG HIP (WITH OR WITHOUT PELVIS) 1V PORT RIGHT COMPARISON:  Study done earlier today FINDINGS: There is interval internal fixation of intertrochanteric fracture of proximal right femur with large caliber surgical screws. There is interval exchange of intramedullary rod with its tip in the proximal shaft. IMPRESSION: Status post internal fixation of intertrochanteric fracture of proximal right femur. Electronically Signed   By: Elmer Picker M.D.   On: 10/28/2021 18:13   DG FEMUR, MIN 2 VIEWS RIGHT  Result Date: 10/28/2021 CLINICAL DATA:  Hardware removal EXAM: RIGHT FEMUR 2 VIEWS COMPARISON:  10/28/2021 FINDINGS: Eight low resolution intraoperative spot views of the right femur. Total fluoroscopy time was 1 minutes 50 seconds, fluoroscopic dose of 13.21 mGy. The images demonstrate previous intramedullary rod and screws in the femur with subsequent removal. Placement of proximal femoral intramedullary rod and fixating screws for intertrochanteric fracture IMPRESSION: Intraoperative fluoroscopic assistance provided during hardware removal and subsequent internal fixation of right intertrochanteric fracture Electronically Signed   By: Donavan Foil M.D.   On: 10/28/2021 16:46   DG Femur Min 2 Views Right  Result Date: 10/28/2021 CLINICAL DATA:  Fall, leg shortened and externally rotated EXAM: RIGHT FEMUR 2 VIEWS  COMPARISON:  None. FINDINGS: Intertrochanteric right hip fracture with foreshortening and  varus angulation. Old right proximal femoral shaft fracture deformity, status post ORIF with IM nail and 2 proximal screws. Degenerative changes of the right knee. IMPRESSION: Intertrochanteric right hip fracture. Electronically Signed   By: Julian Hy M.D.   On: 10/28/2021 02:27     Labs:   Basic Metabolic Panel: Recent Labs  Lab 11/04/21 0406 11/07/21 0502  NA 136 136  K 3.8 3.8  CL 104 102  CO2 25 26  GLUCOSE 99 119*  BUN 12 16  CREATININE 1.05 1.08  CALCIUM 8.2* 8.5*  MG  --  2.1   GFR Estimated Creatinine Clearance: 53.6 mL/min (by C-G formula based on SCr of 1.08 mg/dL). Liver Function Tests: No results for input(s): AST, ALT, ALKPHOS, BILITOT, PROT, ALBUMIN in the last 168 hours. No results for input(s): LIPASE, AMYLASE in the last 168 hours. No results for input(s): AMMONIA in the last 168 hours. Coagulation profile No results for input(s): INR, PROTIME in the last 168 hours.  CBC: Recent Labs  Lab 11/04/21 0406 11/07/21 0502  WBC 9.0 8.7  HGB 10.3* 10.7*  HCT 31.8* 32.4*  MCV 88.3 88.3  PLT 271 339   Cardiac Enzymes: No results for input(s): CKTOTAL, CKMB, CKMBINDEX, TROPONINI in the last 168 hours. BNP: Invalid input(s): POCBNP CBG: No results for input(s): GLUCAP in the last 168 hours. D-Dimer No results for input(s): DDIMER in the last 72 hours. Hgb A1c No results for input(s): HGBA1C in the last 72 hours. Lipid Profile No results for input(s): CHOL, HDL, LDLCALC, TRIG, CHOLHDL, LDLDIRECT in the last 72 hours. Thyroid function studies No results for input(s): TSH, T4TOTAL, T3FREE, THYROIDAB in the last 72 hours.  Invalid input(s): FREET3 Anemia work up No results for input(s): VITAMINB12, FOLATE, FERRITIN, TIBC, IRON, RETICCTPCT in the last 72 hours. Microbiology No results found for this or any previous visit (from the past 240 hour(s)).  Time  coordinating discharge: 35 minutes  Signed: Quang Thorpe  [facility] 11/10/2021, 3:02 PM

## 2021-11-10 NOTE — Progress Notes (Signed)
Report given to Vance at [facility].  AVS given to pt and placed additional copy in discharge packet.  Pt to go to room 408 , according to [place].  Port deaccessed per policy.

## 2021-11-10 NOTE — Progress Notes (Signed)
PROGRESS NOTE  [name] [name]  DOB: Sep 12, 1941  PCP: [name], MD QMG:[id number]  DOA: 10/28/2021  LOS: 13 days  Hospital Day: 14  Brief narrative: [name] [name] is a 81 y.o. male with PMH significant for HTN, lymphomas,, BCC of skin. Patient was brought to the ED on 10/28/2021 by EMS after a fall at home. Imaging showed right hip fracture. Admitted to hospital service 2/10, underwent ORIF. Postprocedure, patient developed significant odynophagia.  Seen by gastroenterology. EGD on 2/16 showed severe esophagitis Currently pending placement to SNF  Subjective: Patient was seen and examined this morning. Pleasant male.  Lying down in bed.  Not in distress.  Waiting for SNF.  Principal Problem:   Closed right hip fracture (HCC) Active Problems:   Essential hypertension   Constipation   GERD (gastroesophageal reflux disease)   Hyponatremia   Odynophagia   Acute postoperative anemia due to expected blood loss   Assessment and Plan: Closed right hip fracture   -Presented after mechanical fall at home.  Imaging showed right hip fracture.  Underwent ORIF on 2/10.   -Reasonably pain controlled.   -Currently due to prophylaxis Lovenox.  Per orthopedic recommendation, to switch to Eliquis at discharge for DVT prophylaxis. -Continue vitamin D supplementation.  Odynophagia -Patient complained of odynophagia postprocedure.  Initially tried on medical management with viscous lidocaine, Carafate and PPI but no significant improvement was achieved.   -Seen by gastroenterology.  Underwent EGD on 2/16 which showed severe reflux esophagitis.  -Per GI recommendation, patient will use Protonix 40 mg twice daily for 8 weeks, sucralfate suspension 1 g p.o. 4 times daily for 4 weeks. Continue viscous Lidocaine AC and HS until symptoms start to improve and able to increase diet. -Currently able to tolerate soft diet. -To follow-up with GI as an outpatient to repeat upper endoscopy in 8 weeks  to check healing.  Essential hypertension  -Blood pressure controlled on amlodipine and lisinopril.     Constipation -Continue aggressive bowel regimen.  As his mobility improves his constipation is also resolved.  Acute postoperative anemia due to expected blood loss -Hemoglobin currently stable over 10  Mobility: Encourage ambulation Goals of care   Code Status: Full Code    Nutritional status:  Body mass index is 25.84 kg/m.  Nutrition Problem: Increased nutrient needs Etiology: hip fracture, post-op healing Signs/Symptoms: estimated needs Diet:  Diet Order             DIET SOFT Room service appropriate? Yes; Fluid consistency: Thin  Diet effective now                   DVT prophylaxis:  enoxaparin (LOVENOX) injection 40 mg Start: 11/04/21 0800 SCDs Start: 10/28/21 1808 SCDs Start: 10/28/21 0354   Antimicrobials: None Fluid: None Consultants: Orthopedics Family Communication: None at bedside  Status is: Inpatient  Continue in-hospital care because: Pending SNF placement Level of care: Med-Surg   Dispo: The patient is from: Home              Anticipated d/c is to: SNF, pending insurance authorization              Patient currently is not medically stable to d/c.   Difficult to place patient No     Infusions:    Scheduled Meds:  (feeding supplement) PROSource Plus  30 mL Oral BID BM   amLODipine  5 mg Oral Daily   Chlorhexidine Gluconate Cloth  6 each Topical Daily   cholecalciferol  10,000  Units Oral Daily   enoxaparin (LOVENOX) injection  40 mg Subcutaneous Q24H   feeding supplement  237 mL Oral TID BM   lisinopril  20 mg Oral Daily   magnesium oxide  400 mg Oral Daily   multivitamin with minerals  1 tablet Oral Daily   pantoprazole (PROTONIX) IV  40 mg Intravenous Q12H   polyethylene glycol  17 g Oral TID   senna-docusate  2 tablet Oral BID   sodium chloride flush  10-40 mL Intracatheter Q12H   sorbitol, milk of mag, mineral oil, glycerin  (SMOG) enema  960 mL Rectal Once   sucralfate  2 g Oral TID PC,HS,0200    PRN meds: alum & mag hydroxide-simeth, bisacodyl, calcium carbonate, famotidine, ondansetron **OR** ondansetron (ZOFRAN) IV, phenol, sodium chloride flush, sodium phosphate, traMADol   Antimicrobials: Anti-infectives (From admission, onward)    Start     Dose/Rate Route Frequency Ordered Stop   10/28/21 2200  ceFAZolin (ANCEF) IVPB 2g/100 mL premix        2 g 200 mL/hr over 30 Minutes Intravenous Every 8 hours 10/28/21 1807 10/29/21 1316   10/28/21 1245  ceFAZolin (ANCEF) IVPB 2g/100 mL premix        2 g 200 mL/hr over 30 Minutes Intravenous On call to O.R. 10/28/21 1218 10/28/21 1501       Objective: Vitals:   11/10/21 0431 11/10/21 0736  BP: 136/72 129/67  Pulse: 81 78  Resp: 17 16  Temp: 97.8 F (36.6 C) 97.8 F (36.6 C)  SpO2: 96% 98%    Intake/Output Summary (Last 24 hours) at 11/10/2021 1316 Last data filed at 11/10/2021 0820 Gross per 24 hour  Intake 240 ml  Output 1000 ml  Net -760 ml    Filed Weights   10/28/21 1239 10/28/21 1742 11/03/21 0903  Weight: 79.4 kg 83.8 kg 79.4 kg   Weight change:  Body mass index is 25.84 kg/m.   Physical Exam: General exam: Pleasant, elderly Caucasian male.  Not in visible distress Skin: No rashes, lesions or ulcers. HEENT: Atraumatic, normocephalic, no obvious bleeding Lungs: Clear to auscultation bilaterally CVS: Regular rate and rhythm, no murmur GI/Abd soft, nontender, nondistended, bowel sound present CNS: Alert, awake, oriented x3 Psychiatry: Mood appropriate Extremities: No pedal edema, no calf tenderness  Data Review: I have personally reviewed the laboratory data and studies available.  F/u labs not required Unresulted Labs (From admission, onward)    None       Signed, Terrilee Croak, MD [facility] 11/10/2021

## 2021-11-10 NOTE — TOC Transition Note (Signed)
Transition of Care [facility]) - CM/SW Discharge Note   Patient Details  Name: TASHAN KREITZER MRN: [id number] Date of Birth: 27-Oct-1940  [facility]) CM/SW Contact:  Joanne Chars, LCSW Phone Number: 11/10/2021, 3:19 PM   Clinical Narrative:  Pt discharging to [facility].  RN call report to [phone].     Final next level of care: Skilled Nursing Facility Barriers to Discharge: Barriers Resolved   Patient Goals and CMS Choice Patient states their goals for this hospitalization and ongoing recovery are:: Daughter states " get more PT at SNF and nutritious meals CMS Medicare.gov Compare Post Acute Care list provided to:: Patient Represenative (must comment) (information given to pt's daughter [email or web address] Choice offered to / list presented to : Adult Children  Discharge Placement              Patient chooses bed at:  ([facility]) Patient to be transferred to facility by: [place] Name of family member notified: left message with daughter Mickel Baas Patient and family notified of of transfer: 11/10/21  Discharge Plan and Services In-house Referral: Clinical Social Work                                   Social Determinants of Health ([place]) Interventions     Readmission Risk Interventions No flowsheet data found.

## 2021-11-10 NOTE — Progress Notes (Signed)
Mobility Specialist: Progress Note   11/10/21 1043  Mobility  Bed Position Chair  Activity Ambulated with assistance in hallway  Level of Assistance Minimal assist, patient does 75% or more  Assistive Device Front wheel walker  RLE Weight Bearing WBAT  Distance Ambulated (ft) 140 ft  Activity Response Tolerated well  $Mobility charge 1 Mobility   Pt received in bed and agreeable to ambulation. Stopped frequently for brief standing breaks d/t fatigue and c/o RLE pain he rated 3-5/10. Pt to [facility] and then to recliner after walk with call bell at his side. Chair alarm is on.   [facility] Lucendia Leard Mobility Specialist Mobility Specialist 5 North: [phone] Mobility Specialist 6 North: [phone]

## 2021-11-10 NOTE — TOC Progression Note (Addendum)
Transition of Care [facility]) - Progression Note    Patient Details  Name: [name] [name] MRN: [id number] Date of Birth: [birth year]  Transition of Care [facility]) CM/SW Contact  Joanne Chars, LCSW Phone Number: 11/10/2021, 2:39 PM  Clinical Narrative:   Per Gerilyn Nestle just called her and said that they never received her clinical information.  She has confirmation that she sent it successfully.  She is refaxing, she cannot continue to hold the bed much longer.     1450: TC [place].  She just received auth and can take pt today.  MD notified.    Expected Discharge Plan: [place] Barriers to Discharge: Continued Medical Work up, Ship broker, SNF Pending bed offer  Expected Discharge Plan and Services Expected Discharge Plan: [place] In-house Referral: Clinical Social Work     Living arrangements for the past 2 months: Single Family Home Expected Discharge Date: 11/07/21                                     Social Determinants of Health (SDOH) Interventions    Readmission Risk Interventions No flowsheet data found.

## 2021-11-10 NOTE — Care Management Important Message (Signed)
Important Message  Patient Details  Name: [name] [name] MRN: [id number] Date of Birth: 04/04/1941   Medicare Important Message Given:  Yes     [name] 11/10/2021, 1:13 PM

## 2021-11-10 NOTE — Progress Notes (Signed)
Occupational Therapy Treatment Patient Details Name: [name] [name] MRN: [id number] DOB: [birth year] Today's Date: 11/10/2021   History of present illness Pt is an 81 y/o male who presents s/p mechanical fall at home. He was found to have a periprosthetic intertrochanteric femur fracture and is now s/p cephalomedullary nailing on 10/28/2021. He is WBAT through the RLE. PMH significant for HTN, malignant neoplasm of pancreas, other malignant lymphomas, L femuir fracture s/p IM nail 09/2019.   OT comments  Focus of session on ADL training. Soreness in R LE continues to limit ability to access R foot for bathing and dressing. Pt educated to use AE, which he is familiar from his prior L hip fx. Moves slowly and fatigues easily. Continues to be appropriate for SNF level rehab and is eager to discharge.    Recommendations for follow up therapy are one component of a multi-disciplinary discharge planning process, led by the attending physician.  Recommendations may be updated based on patient status, additional functional criteria and insurance authorization.    Follow Up Recommendations  Skilled nursing-short term rehab (<3 hours/day)    Assistance Recommended at Discharge Intermittent Supervision/Assistance  Patient can return home with the following  A little help with walking and/or transfers;A lot of help with bathing/dressing/bathroom;Assistance with cooking/housework;Assist for transportation;Help with stairs or ramp for entrance   Equipment Recommendations  None recommended by OT;Other (comment)    Recommendations for Other Services      Precautions / Restrictions Precautions Precautions: Fall Restrictions Weight Bearing Restrictions: No RLE Weight Bearing: Weight bearing as tolerated       Mobility Bed Mobility               General bed mobility comments: in chair    Transfers Overall transfer level: Needs assistance Equipment used: Rolling walker (2 wheels) Transfers:  Sit to/from Stand Sit to Stand: Min guard           General transfer comment: slow to rise     Balance Overall balance assessment: Needs assistance   Sitting balance-Leahy Scale: Good     Standing balance support: Bilateral upper extremity supported, Reliant on assistive device for balance Standing balance-Leahy Scale: Poor                             ADL either performed or assessed with clinical judgement   ADL Overall ADL's : Needs assistance/impaired     Grooming: Wash/dry hands;Wash/dry face;Oral care;Sitting;Set up   Upper Body Bathing: Minimal assistance;Sitting   Lower Body Bathing: Moderate assistance;Sitting/lateral leans   Upper Body Dressing : Sitting;Set up   Lower Body Dressing: Moderate assistance;Sitting/lateral leans                      Extremity/Trunk Assessment              Vision       Perception     Praxis      Cognition Arousal/Alertness: Awake/alert Behavior During Therapy: WFL for tasks assessed/performed Overall Cognitive Status: Within Functional Limits for tasks assessed                                          Exercises      Shoulder Instructions       General Comments      Pertinent Vitals/ Pain  Pain Assessment Pain Assessment: Faces Faces Pain Scale: Hurts little more Pain Location: R hip Pain Descriptors / Indicators: Operative site guarding, Grimacing, Sore, Guarding Pain Intervention(s): Monitored during session, Premedicated before session, Repositioned  Home Living                                          Prior Functioning/Environment              Frequency  Min 2X/week        Progress Toward Goals  OT Goals(current goals can now be found in the care plan section)  Progress towards OT goals: Progressing toward goals  Acute Rehab OT Goals OT Goal Formulation: With patient Time For Goal Achievement: 11/12/21 Potential to  Achieve Goals: Good  Plan Discharge plan remains appropriate;Frequency remains appropriate    Co-evaluation                 AM-PAC OT "6 Clicks" Daily Activity     Outcome Measure   Help from another person eating meals?: None Help from another person taking care of personal grooming?: A Little Help from another person toileting, which includes using toliet, bedpan, or urinal?: A Lot Help from another person bathing (including washing, rinsing, drying)?: A Lot Help from another person to put on and taking off regular upper body clothing?: A Little Help from another person to put on and taking off regular lower body clothing?: A Lot 6 Click Score: 16    End of Session    OT Visit Diagnosis: Unsteadiness on feet (R26.81);Other abnormalities of gait and mobility (R26.89);Muscle weakness (generalized) (M62.81)   Activity Tolerance Patient tolerated treatment well   Patient Left in chair;with call bell/phone within reach;with chair alarm set   Nurse Communication          Time: 1100-1140 OT Time Calculation (min): 40 min  Charges: OT General Charges $OT Visit: 1 Visit OT Treatments $Self Care/Home Management : 38-52 mins  Nestor Lewandowsky, OTR/L Acute Rehabilitation Services Pager: [phone] Office: [phone]   Malka So 11/10/2021, 12:19 PM

## 2021-11-14 DIAGNOSIS — N182 Chronic kidney disease, stage 2 (mild): Secondary | ICD-10-CM | POA: Diagnosis not present

## 2021-11-14 DIAGNOSIS — D649 Anemia, unspecified: Secondary | ICD-10-CM | POA: Diagnosis not present

## 2021-11-14 DIAGNOSIS — E871 Hypo-osmolality and hyponatremia: Secondary | ICD-10-CM | POA: Diagnosis not present

## 2021-11-14 DIAGNOSIS — I10 Essential (primary) hypertension: Secondary | ICD-10-CM | POA: Diagnosis not present

## 2021-11-14 NOTE — Telephone Encounter (Signed)
Called the patient and LVM to schedule a follow up appointment and an egd at [facility]

## 2021-11-15 NOTE — Telephone Encounter (Signed)
Called the patient today and LVM to schedule a follow up appointment and an egd at [facility].

## 2021-11-18 NOTE — Telephone Encounter (Signed)
Angel from [employer] stone called to set up the appointment on the patient's behalf. She can be reached at [phone] ok to leave a message. ?

## 2021-11-21 ENCOUNTER — Other Ambulatory Visit: Payer: Self-pay

## 2021-11-21 ENCOUNTER — Telehealth: Payer: Self-pay

## 2021-11-21 DIAGNOSIS — K219 Gastro-esophageal reflux disease without esophagitis: Secondary | ICD-10-CM

## 2021-11-21 DIAGNOSIS — R1310 Dysphagia, unspecified: Secondary | ICD-10-CM

## 2021-11-21 DIAGNOSIS — M79604 Pain in right leg: Secondary | ICD-10-CM | POA: Diagnosis not present

## 2021-11-21 NOTE — Telephone Encounter (Signed)
[employer]. LVM to call me back ASAP to schedule a follow up appointment and an egd at [facility] for the patient.  ?

## 2021-11-21 NOTE — Telephone Encounter (Signed)
See telephone note on 11/09/21 ?

## 2021-11-21 NOTE — Telephone Encounter (Signed)
Daughter called after I had spoke with Glenard Haring and stated that her dad will be leaving the retirement home on Thursday. Seeking advice if you could make the EGD appointment with her. Please advise.  ?

## 2021-11-21 NOTE — Progress Notes (Signed)
EGD Instruction ?

## 2021-11-21 NOTE — Telephone Encounter (Signed)
Appointment was made with daughter Lattie Haw for EGD at [facility] on 01/17/2022 at 11:15am with Dr. Bryan Lemma. Instructed her that the patient has to be at the hospital at 9:45am 90 minutes prior to his procedure. Instruction was given to her by phone, and sent through [facility]. I also mailed instruction mailed at his home address as well. Per Lattie Haw, patient is not on Eliquis, was given to him at the hospital for short period of time only. Stated she will confirm it and will give a call back tomorrow. Patient office visit with Dr. Bryan Lemma is on 12/29/21.  ?

## 2021-11-25 ENCOUNTER — Other Ambulatory Visit: Payer: Self-pay | Admitting: *Deleted

## 2021-11-25 NOTE — Patient Outreach (Signed)
Per [place] eligible member resides in [facility] SNF.  Screened for potential [facility] Care Management services as a benefit of member's insurance plan. ? ?Mr. Kinn admitted to SNF on 11/11/21 after hospitalization. ? ?Communication sent to facility SW and Admissions Coordinator to make aware writer is following for transition plans and [facility] needs. ? ?Will continue to follow for potential [facility] needs and transition plans while member resides in SNF. ? ? ? ?Marthenia Rolling, MSN, RN,BSN ?[place] Coordinator ?[phone] [facility]) ?[phone]  (Toll free office)   ?

## 2021-11-28 ENCOUNTER — Other Ambulatory Visit: Payer: Self-pay | Admitting: *Deleted

## 2021-11-28 NOTE — Patient Outreach (Signed)
[facility] Post- Acute Care Coordinator follow up. Per [place] eligible member resides in [facility] SNF.  Screened for potential [facility] Care Management services as a benefit of member's insurance plan. ? ?Facility site visit to [facility] skilled nursing facility. Met with Mr. Mcquiston at bedside at [facility]. He was sitting up eating lunch. Conversation was brief. States he is having a home evaluation today in preparation for returning home.  ? ?Briefly discussed [place] Management services. No identifiable [facility] needs at this time.  ? ?Provided [facility] Care Management brochure, 24-hr nurse advice line magnet, and writer's contact information.  ? ? ?Marthenia Rolling, MSN, RN,BSN ?[place] Coordinator ?[phone] [facility]) ?[phone]  (Toll free office)  ? ? ? ? ?  ?

## 2021-11-29 DIAGNOSIS — S72141D Displaced intertrochanteric fracture of right femur, subsequent encounter for closed fracture with routine healing: Secondary | ICD-10-CM | POA: Diagnosis not present

## 2021-11-30 ENCOUNTER — Other Ambulatory Visit: Payer: Self-pay | Admitting: *Deleted

## 2021-11-30 DIAGNOSIS — I10 Essential (primary) hypertension: Secondary | ICD-10-CM

## 2021-11-30 NOTE — Patient Outreach (Addendum)
Telephone call received from Mr. [name] [name] who is currently residing in [facility].  Mr. [name] [name] states he was reviewing [facility] literature writer previously left with him on this past Monday. Mr. [name] [name] asked writer to re-explain [facility] Care Management services. Mr. [name] [name] is now agreeable to [place] Management follow up.  ? ?Mr. [name] [name] states he is transitioning to home from [facility] SNF on this Friday 12/02/21. He endorses he lives alone. States friends assist him with transportation and the like. Crossed checked Mr. [name] [name] number. It does not appear he has benefit for [place] transportation. Encouraged Mr. [name] [name] to call [place] to confirm. Mr. [name] [name] agreeable to transportation resources being mailed to him. Agreeable to [place] referral. Mr. [name] [name] states he usually does not answer unknown calls. However, he will call back if a message is left.  ? ?Confirmed PCP is Dr. Alyson [name] [name] with [facility].  ?Confirmed best contact number for Mr. [name] [name] is [phone]. ? ?Confirmed with SNF SW  Mr. [name] [name] will have Adordation ([place]) for home health services.  ? ?Will make referral to [facility] for care coordination. Mr. [name] [name] has medical history of lymphoma, HTN, GERD.  ? ?Will make referral to [place] for transportation resources.  ? ?Mr. [name] [name] is slated for discharge from [facility] on 12/02/21. ? ?Marthenia Rolling, MSN, RN,BSN ?[place] Coordinator ?[phone] [facility]) ?[phone]  (Toll free office)   ?

## 2021-11-30 NOTE — Addendum Note (Signed)
Addended by: Harless Litten on: 11/30/2021 04:22 PM ? ? Modules accepted: Orders ? ?

## 2021-12-01 ENCOUNTER — Telehealth: Payer: Self-pay | Admitting: *Deleted

## 2021-12-01 DIAGNOSIS — Z7689 Persons encountering health services in other specified circumstances: Secondary | ICD-10-CM | POA: Diagnosis not present

## 2021-12-01 DIAGNOSIS — Z9181 History of falling: Secondary | ICD-10-CM | POA: Diagnosis not present

## 2021-12-01 DIAGNOSIS — R2689 Other abnormalities of gait and mobility: Secondary | ICD-10-CM | POA: Diagnosis not present

## 2021-12-01 DIAGNOSIS — M6281 Muscle weakness (generalized): Secondary | ICD-10-CM | POA: Diagnosis not present

## 2021-12-01 NOTE — Telephone Encounter (Signed)
? ?  Telephone encounter was:  Unsuccessful.  12/01/2021 ?Name: [name] [name] MRN: [id number] DOB: [birth year] ? ?Unsuccessful outbound call made today to assist with:  Transportation Needs  ? ?Outreach Attempt:  1st Attempt ? ?Will mail patient Customer service manager options per case manager request ? ?A HIPAA compliant voice message was left requesting a return call.  Instructed patient to call back at   Instructed patient to call back at [phone]  at their earliest convenience. . ? ? ?Lovett Sox -Selinda Eon ?Care Guide , Embedded Care Coordination ?[place], Care Management  ?[phone] ?[address] , [place] [address] ?Email : [email or web address] ?  ?

## 2021-12-01 NOTE — Patient Outreach (Signed)
[facility]) Care Management ? ?12/01/2021 ? ?Chapman Fitch ?06-15-1941 ?[id number] ? ? ?Received hospital referral from [facility], [place] for [place] (central) for care coordination. Assigned patient to Valente David, RN Care Coordinator for follow up. ? ?Philmore Pali ?[place] Management Assistant ?[phone] ? ?

## 2021-12-02 DIAGNOSIS — S72001A Fracture of unspecified part of neck of right femur, initial encounter for closed fracture: Secondary | ICD-10-CM | POA: Diagnosis not present

## 2021-12-02 NOTE — Telephone Encounter (Signed)
Called Patient's daughter, Lattie Haw to confirm whether the patient is still on Eliquis. Stated his last dose is today and we ended the call. ?

## 2021-12-02 NOTE — Addendum Note (Signed)
Addended by: Howell Pringle on: 12/02/2021 01:33 PM ? ? Modules accepted: Orders ? ?

## 2021-12-05 ENCOUNTER — Encounter: Payer: Self-pay | Admitting: *Deleted

## 2021-12-05 ENCOUNTER — Other Ambulatory Visit: Payer: Self-pay | Admitting: *Deleted

## 2021-12-05 DIAGNOSIS — K219 Gastro-esophageal reflux disease without esophagitis: Secondary | ICD-10-CM | POA: Diagnosis not present

## 2021-12-05 DIAGNOSIS — C8251 Diffuse follicle center lymphoma, lymph nodes of head, face, and neck: Secondary | ICD-10-CM | POA: Diagnosis not present

## 2021-12-05 DIAGNOSIS — C8590 Non-Hodgkin lymphoma, unspecified, unspecified site: Secondary | ICD-10-CM | POA: Diagnosis not present

## 2021-12-05 DIAGNOSIS — S72001A Fracture of unspecified part of neck of right femur, initial encounter for closed fracture: Secondary | ICD-10-CM | POA: Diagnosis not present

## 2021-12-05 DIAGNOSIS — W19XXXD Unspecified fall, subsequent encounter: Secondary | ICD-10-CM | POA: Diagnosis not present

## 2021-12-05 DIAGNOSIS — K5900 Constipation, unspecified: Secondary | ICD-10-CM | POA: Diagnosis not present

## 2021-12-05 DIAGNOSIS — Z7409 Other reduced mobility: Secondary | ICD-10-CM | POA: Diagnosis not present

## 2021-12-05 DIAGNOSIS — Z79891 Long term (current) use of opiate analgesic: Secondary | ICD-10-CM | POA: Diagnosis not present

## 2021-12-05 DIAGNOSIS — E871 Hypo-osmolality and hyponatremia: Secondary | ICD-10-CM | POA: Diagnosis not present

## 2021-12-05 DIAGNOSIS — M9701XD Periprosthetic fracture around internal prosthetic right hip joint, subsequent encounter: Secondary | ICD-10-CM | POA: Diagnosis not present

## 2021-12-05 DIAGNOSIS — S72141D Displaced intertrochanteric fracture of right femur, subsequent encounter for closed fracture with routine healing: Secondary | ICD-10-CM | POA: Diagnosis not present

## 2021-12-05 DIAGNOSIS — I10 Essential (primary) hypertension: Secondary | ICD-10-CM | POA: Diagnosis not present

## 2021-12-05 DIAGNOSIS — Z741 Need for assistance with personal care: Secondary | ICD-10-CM | POA: Diagnosis not present

## 2021-12-05 DIAGNOSIS — D62 Acute posthemorrhagic anemia: Secondary | ICD-10-CM | POA: Diagnosis not present

## 2021-12-05 DIAGNOSIS — Z9181 History of falling: Secondary | ICD-10-CM | POA: Diagnosis not present

## 2021-12-05 NOTE — Patient Instructions (Signed)
Visit Information ? ?Thank you for taking time to visit with me today. Please don't hesitate to contact me if I can be of assistance to you before our next scheduled telephone appointment. ? ?Following are the goals we discussed today:  ?Call PCP to schedule follow up appointment ?Call Ortho doctor to confirm next appointment. ?Call Jeanine (Care Guide) at [phone] for transportation and food resources  ? ?Our next appointment is by telephone in 1 week ? ?The patient verbalized understanding of instructions, educational materials, and care plan provided today and agreed to receive a mailed copy of patient instructions, educational materials, and care plan.  ? ?The patient has been provided with contact information for the care management team and has been advised to call with any health related questions or concerns.  ? ?Valente David, RN, MSN, CCM ?[facility] Care Management  ?Community Care Manager ?[phone] ? ?

## 2021-12-05 NOTE — Patient Outreach (Signed)
?[facility]) Care Management ?Telephonic RN Care Manager Note ? ? ?12/05/2021 ?Name:  [name] [name] MRN:  [id number] DOB:  [birth year] ? ?Summary: ? ?Referral Date: 3/16 ?Referral Source: Post Acute Care Coordinator ?Date of Admission: 2/23 to [facility] SNF ?Diagnosis: Closed Hip fracture ?Date of Discharge: 3/17 ?Facility: [facility] ?Insurance: Traditional Medicare ?  ? ?Outreach attempt #2, incoming call received back from member.  Identity verified.  This care manager introduced self and stated purpose of call.  [facility] care management services explained.   ? ? ?Social: Lives at home alone, state a friend stayed with him the first night but has been alone since.  Daughter lives 15 minutes away, helps with laundry.  Report he is independent with bathing and dressing.  Has been in contact with [employer] who will provide prepared food for the next couple weeks.  Admits that he will need a long term plan for prepared meals going forward.  State he has been in contact with [facility], will start services today for home health. ? ?Conditions: Per chart, has history of HTN (controlled), GERD, thrombocytopenia, lymphoma, and osteoarthritis.  [place] last month and fractured hip, had ORIF and discharged to SNF.  During hospitalization, experienced odynophagia, report this is better since discharge. ? ?Medications: Reviewed with member, denies need for financial assistance.  State his Carafate and Protonix were both discontinued during SNF stay as a result if his condition improving.   ? ?Appointments: Was seen by Ortho specialist on 3/16 prior to discharge home, state he will revisit within the next month.  Appointment with GI on 4/13, has not called to schedule visit with PCP but will call.  He will need transportation, aware that Care Guide has tried to contact him without success.  Provided with contact information, encouraged to call again as well as this RNCM will notify her that he is now at home.    ? ?Consent: Agrees to [facility] involvement. ? ?Subjective: ?[name] [name] is an 81 y.o. year old male who is a primary patient of [name], MD. The care management team was consulted for assistance with care management and/or care coordination needs.   ? ?Telephonic RN Care Manager completed Telephone Visit today. ? ?Objective:  ? ?Medications Reviewed Today   ? ? Reviewed by Valente David, RN (Registered Nurse) on 12/05/21 at 1218  Med List Status: <None>  ? ?Medication Order Taking? Sig Documenting Provider Last Dose Status Informant  ?Aflibercept 2 MG/0.05ML SOLN [id number]  Inject 0.5 mLs into the eye as directed. Take every 6 wks in eye doctor office. [provider]  Active Self, Family Member, Pharmacy Records  ?         ?Med Note Gershon Cull, Joanell Rising   Fri Oct 28, 2021  3:20 AM) Gets done at eye doctor   ?alum & mag hydroxide-simeth (MAALOX/MYLANTA) 200-200-20 MG/5ML suspension [id number]  Take 15 mLs by mouth every 6 (six) hours as needed for indigestion or heartburn. Bonnielee Haff, MD  Active   ?amLODipine (NORVASC) 5 MG tablet [id number]  Take 5 mg by mouth daily. [provider]  Active Self, Family Member, Pharmacy Records  ?apixaban (ELIQUIS) 2.5 MG TABS tablet [id number]  Take 1 tablet (2.5 mg total) by mouth 2 (two) times daily. Corinne Ports, PA-C  Expired 12/01/21 2359   ?Ascorbic Acid (VITAMIN C) 1000 MG tablet [id number]  Take 1,000 mg by mouth daily. Pt states he takes 6,'000mg'$  [provider]  Active Self, Family Member, Pharmacy Records  ?         ?  Med Note Gloriann Loan, DIANE H   Thu Feb 12, 2020 10:59 AM) "4000 mg /day -powder"  ?bisacodyl (DULCOLAX) 10 MG suppository [id number]  Place 1 suppository (10 mg total) rectally daily as needed for moderate constipation. Bonnielee Haff, MD  Active   ?calcium carbonate (TUMS - DOSED IN MG ELEMENTAL CALCIUM) 500 MG chewable tablet [id number]  Chew 1 tablet (200 mg of elemental calcium total) by mouth 3 (three) times daily as needed  for indigestion or heartburn. Bonnielee Haff, MD  Active   ?cholecalciferol (VITAMIN D) 1000 UNITS tablet [id number]  Take 15,000 Units by mouth daily.  [provider]  Active Self, Family Member, Pharmacy Records  ?         ?Med Note Gloriann Loan, DIANE H   Thu Feb 12, 2020 11:00 AM) "takes 10000 iu"  ?famotidine (PEPCID) 10 MG tablet [id number]  Take 1 tablet (10 mg total) by mouth 2 (two) times daily as needed for heartburn or indigestion. Bonnielee Haff, MD  Active   ?feeding supplement (ENSURE ENLIVE / ENSURE PLUS) LIQD [id number]  Take 237 mLs by mouth 3 (three) times daily between meals. Bonnielee Haff, MD  Active   ?heparin lock flush 100 unit/mL [id number]   Curt Bears, MD  Active   ?lisinopril (ZESTRIL) 20 MG tablet [id number]  Take 20 mg by mouth daily. [provider]  Active Self, Family Member, Pharmacy Records  ?magnesium oxide (MAG-OX) 400 MG tablet [id number]  Take 400 mg by mouth daily. "I take '500mg'$ /day" [provider]  Active Self, Family Member, Pharmacy Records  ?         ?Med Note Gloriann Loan, DIANE H   Thu Feb 12, 2020 10:58 AM) " I take 1000 mg /day"  ?multivitamin-lutein (OCUVITE-LUTEIN) CAPS capsule [id number]  Take 1 capsule by mouth daily. [provider]  Active Pharmacy Records, Family Member, Self  ?Nutritional Supplements (,FEEDING SUPPLEMENT, PROSOURCE PLUS) liquid [id number]  Take 30 mLs by mouth 2 (two) times daily between meals. Bonnielee Haff, MD  Active   ?pantoprazole (PROTONIX) 40 MG tablet [id number]  Take 1 tablet (40 mg total) by mouth 2 (two) times daily before a meal. Bonnielee Haff, MD  Active   ?polyethylene glycol (MIRALAX / GLYCOLAX) 17 g packet [id number]  Take 17 g by mouth 3 (three) times daily. Bonnielee Haff, MD  Active   ?Probiotic Product (PROBIOTIC FORMULA PO) [id number]  Take 1 tablet by mouth daily. [provider]  Active Self, Family Member, Pharmacy Records  ?senna-docusate (SENOKOT-S) 8.6-50 MG tablet [id number]  Take 2  tablets by mouth 2 (two) times daily. Bonnielee Haff, MD  Active   ?sodium chloride 0.9 % injection 10 mL [id number]   Curt Bears, MD  Active   ?sodium chloride 0.9 % injection 10 mL [id number]   Curt Bears, MD  Active   ?sucralfate (CARAFATE) 1 GM/10ML suspension [id number]  Take 20 mLs (2 g total) by mouth 3 (three) times daily after meals, bedtime and 0200. Bonnielee Haff, MD  Active   ?traMADol Veatrice Bourbon) 50 MG tablet [id number]  Take 1-2 tablets (50-100 mg total) by mouth every 6 (six) hours as needed for moderate pain or severe pain (50 mg moderate pain, 100 mg severe pain). Corinne Ports, PA-C  Active   ?Zinc 30 MG TABS [id number]  Take 1 tablet by mouth every morning. Pt states he takes 42mg. [provider]  Active Self, Family Member, Pharmacy Records  ?         ?Med Note (S[place]TER[place], DARIUS  E   Sat Oct 04, 2019  7:14 PM)    ? ?  ?  ? ?  ? ? ? ?SDOH:  (Social Determinants of Health) assessments and interventions performed:  ? ? ? ?Care Plan ? ?Review of patient past medical history, allergies, medications, health status, including review of consultants reports, laboratory and other test data, was performed as part of comprehensive evaluation for care management services.  ? ?Care Plan : [facility] Plan of Care (Adult)  ?Updates made by Valente David, RN since 12/05/2021 12:00 AM  ?  ? ?Problem: Need for chronic care management due to recent fall resulting in hip fracture requiring ORIF   ?Priority: High  ?  ? ?Long-Range Goal: Member will not have any falls or be readmitted to hospital in the next 3 months   ?Start Date: 12/05/2021  ?Expected End Date: 03/05/2022  ?Priority: High  ?Note:   ?Current Barriers:  ?Knowledge Deficits related to plan of care for management of fall/hip fracture  ?Care Coordination needs related to Limited access to food ?Transportation barriers ? ?RNCM Clinical Goal(s):  ?Patient will verbalize understanding of plan for management of fall/hip fracture as evidenced by  no recurrent falls ?take all medications exactly as prescribed and will call provider for medication related questions as evidenced by member reported compliance ?attend all scheduled medical appointments: Ortho,

## 2021-12-05 NOTE — Patient Outreach (Signed)
[facility]) Care Management ? ?12/05/2021 ? ?Chapman Fitch ?08/11/1941 ?[id number] ? ? ?Referral Date: 3/16 ?Referral Source: Post Acute Care Coordinator ?Date of Admission: 2/23 to [facility] SNF ?Diagnosis: Closed Hip fracture ?Date of Discharge: 3/17 ?Facility: [facility] ?Insurance: Traditional Medicare ? ?Outreach attempt #1, unsuccessful to member's confirmed contact number [phone]), HIPAA compliant voice message left.   ? ? ?Plan: ?RN CM will send outreach letter and follow up within the next 3-4 business days. ? ?Valente David, RN, MSN, CCM ?[facility] Care Management  ?Community Care Manager ?[phone] ? ?

## 2021-12-06 ENCOUNTER — Telehealth: Payer: Self-pay | Admitting: *Deleted

## 2021-12-06 DIAGNOSIS — S72141D Displaced intertrochanteric fracture of right femur, subsequent encounter for closed fracture with routine healing: Secondary | ICD-10-CM | POA: Diagnosis not present

## 2021-12-06 DIAGNOSIS — C8590 Non-Hodgkin lymphoma, unspecified, unspecified site: Secondary | ICD-10-CM | POA: Diagnosis not present

## 2021-12-06 DIAGNOSIS — E871 Hypo-osmolality and hyponatremia: Secondary | ICD-10-CM | POA: Diagnosis not present

## 2021-12-06 DIAGNOSIS — I10 Essential (primary) hypertension: Secondary | ICD-10-CM | POA: Diagnosis not present

## 2021-12-06 DIAGNOSIS — D62 Acute posthemorrhagic anemia: Secondary | ICD-10-CM | POA: Diagnosis not present

## 2021-12-06 DIAGNOSIS — K219 Gastro-esophageal reflux disease without esophagitis: Secondary | ICD-10-CM | POA: Diagnosis not present

## 2021-12-06 DIAGNOSIS — M9701XD Periprosthetic fracture around internal prosthetic right hip joint, subsequent encounter: Secondary | ICD-10-CM | POA: Diagnosis not present

## 2021-12-06 DIAGNOSIS — W19XXXD Unspecified fall, subsequent encounter: Secondary | ICD-10-CM | POA: Diagnosis not present

## 2021-12-06 DIAGNOSIS — C8251 Diffuse follicle center lymphoma, lymph nodes of head, face, and neck: Secondary | ICD-10-CM | POA: Diagnosis not present

## 2021-12-06 DIAGNOSIS — K5900 Constipation, unspecified: Secondary | ICD-10-CM | POA: Diagnosis not present

## 2021-12-06 NOTE — Telephone Encounter (Signed)
? ?  Telephone encounter was:  Successful.  ?12/06/2021 ?Name: JARMARCUS WAMBOLD MRN: [id number] DOB: [birth year] ? ?JOSIAS TOMERLIN is a 81 y.o. year old male who is a primary care patient of [name], MD . The community resource team was consulted for assistance with Transportation Needs  and meal prep ? ?Care guide performed the following interventions: Patients medicare plan does not cover transportation and asked about meals on wheels will put in referral but patient advised he has someone moving in to help him with meals etc. Will look inot transportation for him through thn and send [place] mobility information to him. ? ?Follow Up Plan:  Care guide will follow up with patient by phone over the next am ? ?Arling Cerone Greenauer -Selinda Eon ?Care Guide , Embedded Care Coordination ?[place], Care Management  ?[phone] ?[address] , [place] [address] ?Email : Ashby Dawes. [email or web address]-moran [email or web address] ?  ?

## 2021-12-08 ENCOUNTER — Telehealth: Payer: Self-pay | Admitting: *Deleted

## 2021-12-08 NOTE — Telephone Encounter (Signed)
? ?  Telephone encounter was:  Successful.  ?12/08/2021 ?Name: [name] [name] MRN: [id number] DOB: [birth year] ? ?[name] is a 81 y.o. year old male who is a primary care patient of [name], MD . The community resource team was consulted for assistance with provided information about 4/[facility] appt at the cancer center email sent to lauren Wallis Bamberg at [facility] for transportation she confirmed with their transportation that patient should be registered , also mailed outside transportation resources to patient  ? ?Care guide performed the following interventions: Patient provided with information about care guide support team and interviewed to confirm resource needs ?Follow up call placed to the patient to discuss status of referral. ? ?Follow Up Plan:  Care guide will follow up with patient by phone over the next 10 days ? ?Penne Rosenstock Greenauer -Selinda Eon ?Care Guide , Embedded Care Coordination ?[place], Care Management  ?[phone] ?[address] , [place] [address] ?Email : Ashby Dawes. [email or web address]-moran [email or web address] ?  ? ?

## 2021-12-09 DIAGNOSIS — K5900 Constipation, unspecified: Secondary | ICD-10-CM | POA: Diagnosis not present

## 2021-12-09 DIAGNOSIS — S72141D Displaced intertrochanteric fracture of right femur, subsequent encounter for closed fracture with routine healing: Secondary | ICD-10-CM | POA: Diagnosis not present

## 2021-12-09 DIAGNOSIS — C8251 Diffuse follicle center lymphoma, lymph nodes of head, face, and neck: Secondary | ICD-10-CM | POA: Diagnosis not present

## 2021-12-09 DIAGNOSIS — K219 Gastro-esophageal reflux disease without esophagitis: Secondary | ICD-10-CM | POA: Diagnosis not present

## 2021-12-09 DIAGNOSIS — E871 Hypo-osmolality and hyponatremia: Secondary | ICD-10-CM | POA: Diagnosis not present

## 2021-12-09 DIAGNOSIS — I10 Essential (primary) hypertension: Secondary | ICD-10-CM | POA: Diagnosis not present

## 2021-12-09 DIAGNOSIS — M9701XD Periprosthetic fracture around internal prosthetic right hip joint, subsequent encounter: Secondary | ICD-10-CM | POA: Diagnosis not present

## 2021-12-09 DIAGNOSIS — D62 Acute posthemorrhagic anemia: Secondary | ICD-10-CM | POA: Diagnosis not present

## 2021-12-09 DIAGNOSIS — C8590 Non-Hodgkin lymphoma, unspecified, unspecified site: Secondary | ICD-10-CM | POA: Diagnosis not present

## 2021-12-09 DIAGNOSIS — W19XXXD Unspecified fall, subsequent encounter: Secondary | ICD-10-CM | POA: Diagnosis not present

## 2021-12-12 ENCOUNTER — Other Ambulatory Visit: Payer: Self-pay | Admitting: *Deleted

## 2021-12-12 NOTE — Patient Outreach (Signed)
?[place] [facility]) Care Management ?Telephonic RN Care Manager Note ? ? ?12/12/2021 ?Name:  [name] [name] MRN:  [id number] DOB:  Jan 11, 1941 ? ?Summary: ?Weekly transition of care call placed to member, successful.  Denies any urgent concerns, encouraged to contact this care manager with questions.   ? ?Subjective: ?[name] [name] is an 81 y.o. year old male who is a primary patient of [name], MD. The care management team was consulted for assistance with care management and/or care coordination needs.   ? ?Telephonic RN Care Manager completed Telephone Visit today. ? ?Objective:  ? ?Medications Reviewed Today   ? ? Reviewed by Valente David, RN (Registered Nurse) on 12/05/21 at 1218  Med List Status: <None>  ? ?Medication Order Taking? Sig Documenting Provider Last Dose Status Informant  ?Aflibercept 2 MG/0.05ML SOLN [id number]  Inject 0.5 mLs into the eye as directed. Take every 6 wks in eye doctor office. [provider]  Active Self, Family Member, Pharmacy Records  ?         ?Med Note Gershon Cull, Joanell Rising   Fri Oct 28, 2021  3:20 AM) Gets done at eye doctor   ?alum & mag hydroxide-simeth (MAALOX/MYLANTA) 200-200-20 MG/5ML suspension [id number]  Take 15 mLs by mouth every 6 (six) hours as needed for indigestion or heartburn. Bonnielee Haff, MD  Active   ?amLODipine (NORVASC) 5 MG tablet [id number]  Take 5 mg by mouth daily. [provider]  Active Self, Family Member, Pharmacy Records  ?apixaban (ELIQUIS) 2.5 MG TABS tablet [id number]  Take 1 tablet (2.5 mg total) by mouth 2 (two) times daily. Corinne Ports, PA-C  Expired 12/01/21 2359   ?Ascorbic Acid (VITAMIN C) 1000 MG tablet [id number]  Take 1,000 mg by mouth daily. Pt states he takes 6,'000mg'$  [provider]  Active Self, Family Member, Pharmacy Records  ?         ?Med Note Astronomer, DIANE H   Thu Feb 12, 2020 10:59 AM) "4000 mg /day -powder"  ?bisacodyl (DULCOLAX) 10 MG suppository February 25, 2020  Place 1 suppository (10 mg  total) rectally daily as needed for moderate constipation. [id number], MD  Active   ?calcium carbonate (TUMS - DOSED IN MG ELEMENTAL CALCIUM) 500 MG chewable tablet Bonnielee Haff  Chew 1 tablet (200 mg of elemental calcium total) by mouth 3 (three) times daily as needed for indigestion or heartburn. [id number], MD  Active   ?cholecalciferol (VITAMIN D) 1000 UNITS tablet Bonnielee Haff  Take 15,000 Units by mouth daily.  [provider]  Active Self, Family Member, Pharmacy Records  ?         ?Med Note [id number], DIANE H   Thu Feb 12, 2020 11:00 AM) "takes 10000 iu"  ?famotidine (PEPCID) 10 MG tablet February 25, 2020  Take 1 tablet (10 mg total) by mouth 2 (two) times daily as needed for heartburn or indigestion. [id number], MD  Active   ?feeding supplement (ENSURE ENLIVE / ENSURE PLUS) LIQD Bonnielee Haff  Take 237 mLs by mouth 3 (three) times daily between meals. [id number], MD  Active   ?heparin lock flush 100 unit/mL Bonnielee Haff   [id number], MD  Active   ?lisinopril (ZESTRIL) 20 MG tablet Curt Bears  Take 20 mg by mouth daily. [provider]  Active Self, Family Member, Pharmacy Records  ?magnesium oxide (MAG-OX) 400 MG tablet [id number]  Take 400 mg by mouth daily. "I take '500mg'$ /day" [provider]  Active Self, Family Member, Pharmacy Records  ?         ?  Med Note Gloriann Loan, DIANE H   Thu Feb 12, 2020 10:58 AM) " I take 1000 mg /day"  ?multivitamin-lutein (OCUVITE-LUTEIN) CAPS capsule [id number]  Take 1 capsule by mouth daily. [provider]  Active Pharmacy Records, Family Member, Self  ?Nutritional Supplements (,FEEDING SUPPLEMENT, PROSOURCE PLUS) liquid [id number]  Take 30 mLs by mouth 2 (two) times daily between meals. Bonnielee Haff, MD  Active   ?pantoprazole (PROTONIX) 40 MG tablet [id number]  Take 1 tablet (40 mg total) by mouth 2 (two) times daily before a meal. Bonnielee Haff, MD  Active   ?polyethylene glycol (MIRALAX / GLYCOLAX) 17 g packet [id number]  Take 17 g by mouth  3 (three) times daily. Bonnielee Haff, MD  Active   ?Probiotic Product (PROBIOTIC FORMULA PO) [id number]  Take 1 tablet by mouth daily. [provider]  Active Self, Family Member, Pharmacy Records  ?senna-docusate (SENOKOT-S) 8.6-50 MG tablet [id number]  Take 2 tablets by mouth 2 (two) times daily. Bonnielee Haff, MD  Active   ?sodium chloride 0.9 % injection 10 mL [id number]   Curt Bears, MD  Active   ?sodium chloride 0.9 % injection 10 mL [id number]   Curt Bears, MD  Active   ?sucralfate (CARAFATE) 1 GM/10ML suspension [id number]  Take 20 mLs (2 g total) by mouth 3 (three) times daily after meals, bedtime and 0200. Bonnielee Haff, MD  Active   ?traMADol Veatrice Bourbon) 50 MG tablet [id number]  Take 1-2 tablets (50-100 mg total) by mouth every 6 (six) hours as needed for moderate pain or severe pain (50 mg moderate pain, 100 mg severe pain). Corinne Ports, PA-C  Active   ?Zinc 30 MG TABS [id number]  Take 1 tablet by mouth every morning. Pt states he takes 67mg. [provider]  Active Self, Family Member, Pharmacy Records  ?         ?Med Note (Elyn Peers  Sat Oct 04, 2019  7:14 PM)    ? ?  ?  ? ?  ? ? ? ?SDOH:  (Social Determinants of Health) assessments and interventions performed:  ? ? ? ?Care Plan ? ?Review of patient past medical history, allergies, medications, health status, including review of consultants reports, laboratory and other test data, was performed as part of comprehensive evaluation for care management services.  ? ?[facility]  ?Updates made by LValente David RN since 12/12/2021 12:00 AM  ?  ? ?Problem: Need for chronic care management due to recent fall resulting in hip fracture requiring ORIF   ?Priority: High  ?  ? ?Long-Range Goal: Member will not have any falls or be readmitted to hospital in the next 3 months   ?Start Date: 12/05/2021  ?Expected End Date: 03/05/2022  ?This Visit's Progress: On track  ?Priority: High  ?Note:    ?Current Barriers:  ?Knowledge Deficits related to plan of care for management of fall/hip fracture  ?Care Coordination needs related to Limited access to food ?Transportation barriers ? ?RNCM Clinical Goal(s):  ?Patient will verbalize understanding of plan for management of fall/hip fracture as evidenced by no recurrent falls ?take all medications exactly as prescribed and will call provider for medication related questions as evidenced by member reported compliance ?attend all scheduled medical appointments: Ortho, GI, and PCP as evidenced by Member reported attendance and EMR records ?work with community resource care guide to address needs related to  Limited access to food as evidenced by patient and/or community resource care guide support ?work  with home health PT/OT to increase strength and independence as evidenced by ability to care for self in the home  through collaboration with RN Care manager, provider, and care team.  ? ?Interventions: ?Inter-disciplinary care team collaboration (see longitudinal plan of care) ?Evaluation of current treatment plan related to  self management and patient's adherence to plan as established by provider ? ? ?Falls Interventions:  (Status:  Goal on track:  Yes.) Long Term Goal ?Provided written and verbal education re: potential causes of falls and Fall prevention strategies ?Advised patient of importance of notifying provider of falls ?Assessed for falls since last encounter ? ? ? ?Surgery (ORIF):  (Status: Goal on track:  Yes.) Long Term Goal ?Evaluation of current treatment plan related to ORIF surgery ?assessed patient/caregiver understanding of surgical procedure   ?addressed questions about post - surgical incision care  ?reviewed medications with patient and addressed questions ?performed PHQ2-PHQ9 assessment    ? ?Patient Goals/Self-Care Activities: ?Take all medications as prescribed ?Attend all scheduled provider appointments ?Perform all self care activities  independently  ? ?Follow Up Plan:  The patient has been provided with contact information for the care management team and has been advised to call with any health related questions or concerns.  ? ? ?Update 3

## 2021-12-14 DIAGNOSIS — M9701XD Periprosthetic fracture around internal prosthetic right hip joint, subsequent encounter: Secondary | ICD-10-CM | POA: Diagnosis not present

## 2021-12-14 DIAGNOSIS — C8251 Diffuse follicle center lymphoma, lymph nodes of head, face, and neck: Secondary | ICD-10-CM | POA: Diagnosis not present

## 2021-12-14 DIAGNOSIS — E871 Hypo-osmolality and hyponatremia: Secondary | ICD-10-CM | POA: Diagnosis not present

## 2021-12-14 DIAGNOSIS — S72141D Displaced intertrochanteric fracture of right femur, subsequent encounter for closed fracture with routine healing: Secondary | ICD-10-CM | POA: Diagnosis not present

## 2021-12-14 DIAGNOSIS — K219 Gastro-esophageal reflux disease without esophagitis: Secondary | ICD-10-CM | POA: Diagnosis not present

## 2021-12-14 DIAGNOSIS — D62 Acute posthemorrhagic anemia: Secondary | ICD-10-CM | POA: Diagnosis not present

## 2021-12-14 DIAGNOSIS — I10 Essential (primary) hypertension: Secondary | ICD-10-CM | POA: Diagnosis not present

## 2021-12-14 DIAGNOSIS — K5900 Constipation, unspecified: Secondary | ICD-10-CM | POA: Diagnosis not present

## 2021-12-14 DIAGNOSIS — W19XXXD Unspecified fall, subsequent encounter: Secondary | ICD-10-CM | POA: Diagnosis not present

## 2021-12-14 DIAGNOSIS — C8590 Non-Hodgkin lymphoma, unspecified, unspecified site: Secondary | ICD-10-CM | POA: Diagnosis not present

## 2021-12-16 DIAGNOSIS — W19XXXD Unspecified fall, subsequent encounter: Secondary | ICD-10-CM | POA: Diagnosis not present

## 2021-12-16 DIAGNOSIS — S72141D Displaced intertrochanteric fracture of right femur, subsequent encounter for closed fracture with routine healing: Secondary | ICD-10-CM | POA: Diagnosis not present

## 2021-12-16 DIAGNOSIS — D62 Acute posthemorrhagic anemia: Secondary | ICD-10-CM | POA: Diagnosis not present

## 2021-12-16 DIAGNOSIS — Z9889 Other specified postprocedural states: Secondary | ICD-10-CM | POA: Diagnosis not present

## 2021-12-16 DIAGNOSIS — E559 Vitamin D deficiency, unspecified: Secondary | ICD-10-CM | POA: Diagnosis not present

## 2021-12-16 DIAGNOSIS — I10 Essential (primary) hypertension: Secondary | ICD-10-CM | POA: Diagnosis not present

## 2021-12-16 DIAGNOSIS — M9701XD Periprosthetic fracture around internal prosthetic right hip joint, subsequent encounter: Secondary | ICD-10-CM | POA: Diagnosis not present

## 2021-12-16 DIAGNOSIS — K2100 Gastro-esophageal reflux disease with esophagitis, without bleeding: Secondary | ICD-10-CM | POA: Diagnosis not present

## 2021-12-16 DIAGNOSIS — K5900 Constipation, unspecified: Secondary | ICD-10-CM | POA: Diagnosis not present

## 2021-12-16 DIAGNOSIS — E871 Hypo-osmolality and hyponatremia: Secondary | ICD-10-CM | POA: Diagnosis not present

## 2021-12-16 DIAGNOSIS — N401 Enlarged prostate with lower urinary tract symptoms: Secondary | ICD-10-CM | POA: Diagnosis not present

## 2021-12-16 DIAGNOSIS — K219 Gastro-esophageal reflux disease without esophagitis: Secondary | ICD-10-CM | POA: Diagnosis not present

## 2021-12-16 DIAGNOSIS — C8251 Diffuse follicle center lymphoma, lymph nodes of head, face, and neck: Secondary | ICD-10-CM | POA: Diagnosis not present

## 2021-12-16 DIAGNOSIS — Z09 Encounter for follow-up examination after completed treatment for conditions other than malignant neoplasm: Secondary | ICD-10-CM | POA: Diagnosis not present

## 2021-12-16 DIAGNOSIS — C8590 Non-Hodgkin lymphoma, unspecified, unspecified site: Secondary | ICD-10-CM | POA: Diagnosis not present

## 2021-12-19 ENCOUNTER — Telehealth: Payer: Self-pay | Admitting: *Deleted

## 2021-12-19 DIAGNOSIS — C8251 Diffuse follicle center lymphoma, lymph nodes of head, face, and neck: Secondary | ICD-10-CM | POA: Diagnosis not present

## 2021-12-19 DIAGNOSIS — K5900 Constipation, unspecified: Secondary | ICD-10-CM | POA: Diagnosis not present

## 2021-12-19 DIAGNOSIS — M9701XD Periprosthetic fracture around internal prosthetic right hip joint, subsequent encounter: Secondary | ICD-10-CM | POA: Diagnosis not present

## 2021-12-19 DIAGNOSIS — W19XXXD Unspecified fall, subsequent encounter: Secondary | ICD-10-CM | POA: Diagnosis not present

## 2021-12-19 DIAGNOSIS — D62 Acute posthemorrhagic anemia: Secondary | ICD-10-CM | POA: Diagnosis not present

## 2021-12-19 DIAGNOSIS — K219 Gastro-esophageal reflux disease without esophagitis: Secondary | ICD-10-CM | POA: Diagnosis not present

## 2021-12-19 DIAGNOSIS — S72141D Displaced intertrochanteric fracture of right femur, subsequent encounter for closed fracture with routine healing: Secondary | ICD-10-CM | POA: Diagnosis not present

## 2021-12-19 DIAGNOSIS — C8590 Non-Hodgkin lymphoma, unspecified, unspecified site: Secondary | ICD-10-CM | POA: Diagnosis not present

## 2021-12-19 DIAGNOSIS — I10 Essential (primary) hypertension: Secondary | ICD-10-CM | POA: Diagnosis not present

## 2021-12-19 DIAGNOSIS — E871 Hypo-osmolality and hyponatremia: Secondary | ICD-10-CM | POA: Diagnosis not present

## 2021-12-19 NOTE — Telephone Encounter (Signed)
? ?  Telephone encounter was:  Successful.  ?12/19/2021 ?Name: [name] [name] MRN: [id number] DOB: [birth year] ? ?[name] [name] is a 81 y.o. year old male who is a primary care patient of [name], MD . The community resource team was consulted for assistance with Patient received the information , daughter completeing [place] mobility application and patient informaed he is waitlisted on [employer]als [employer] wheels  ? ?Care guide performed the following interventions: Patient received the information , daughter completeing [place] mobility application and patient informaed he is waitlisted on Meals on wheels  ?. ? ?Follow Up Plan:  No further follow up planned at this time. The patient has been provided with needed resources. ? ?Lovett Sox -Selinda Eon ?Care Guide , Embedded Care Coordination ?[place], Care Management  ?[phone] ?[address] , [place] [address] ?Email : Ashby Dawes. Greenauer-moran [email or web address] ?  ? ?

## 2021-12-20 ENCOUNTER — Other Ambulatory Visit: Payer: Self-pay | Admitting: *Deleted

## 2021-12-20 DIAGNOSIS — H34813 Central retinal vein occlusion, bilateral, with macular edema: Secondary | ICD-10-CM | POA: Diagnosis not present

## 2021-12-20 DIAGNOSIS — H35372 Puckering of macula, left eye: Secondary | ICD-10-CM | POA: Diagnosis not present

## 2021-12-20 DIAGNOSIS — H3582 Retinal ischemia: Secondary | ICD-10-CM | POA: Diagnosis not present

## 2021-12-20 DIAGNOSIS — H43813 Vitreous degeneration, bilateral: Secondary | ICD-10-CM | POA: Diagnosis not present

## 2021-12-20 NOTE — Patient Outreach (Signed)
[place] [facility]) Care Management ? ?12/20/2021 ? ?Chapman Fitch ?October 27, 1940 ?[id number] ? ? ?Outgoing call placed to member, state he is currently at the doctor's office, unable to speak at this time but will call this care manager back.  If no call back, will follow up within the next week. ? ?Valente David, RN, MSN, CCM ?[facility] Care Management  ?Community Care Manager ?[phone] ? ?

## 2021-12-21 ENCOUNTER — Inpatient Hospital Stay: Payer: Medicare HMO | Attending: Internal Medicine

## 2021-12-21 ENCOUNTER — Other Ambulatory Visit: Payer: Self-pay

## 2021-12-21 DIAGNOSIS — Z95828 Presence of other vascular implants and grafts: Secondary | ICD-10-CM

## 2021-12-21 DIAGNOSIS — Z452 Encounter for adjustment and management of vascular access device: Secondary | ICD-10-CM | POA: Diagnosis not present

## 2021-12-21 DIAGNOSIS — Z8572 Personal history of non-Hodgkin lymphomas: Secondary | ICD-10-CM | POA: Insufficient documentation

## 2021-12-21 DIAGNOSIS — C8251 Diffuse follicle center lymphoma, lymph nodes of head, face, and neck: Secondary | ICD-10-CM

## 2021-12-21 MED ORDER — HEPARIN SOD (PORK) LOCK FLUSH 100 UNIT/ML IV SOLN
500.0000 [IU] | Freq: Once | INTRAVENOUS | Status: AC
  Administered 2021-12-21: 500 [IU]

## 2021-12-21 MED ORDER — SODIUM CHLORIDE 0.9% FLUSH
10.0000 mL | Freq: Once | INTRAVENOUS | Status: AC
  Administered 2021-12-21: 10 mL

## 2021-12-23 DIAGNOSIS — K5900 Constipation, unspecified: Secondary | ICD-10-CM | POA: Diagnosis not present

## 2021-12-23 DIAGNOSIS — W19XXXD Unspecified fall, subsequent encounter: Secondary | ICD-10-CM | POA: Diagnosis not present

## 2021-12-23 DIAGNOSIS — I10 Essential (primary) hypertension: Secondary | ICD-10-CM | POA: Diagnosis not present

## 2021-12-23 DIAGNOSIS — D62 Acute posthemorrhagic anemia: Secondary | ICD-10-CM | POA: Diagnosis not present

## 2021-12-23 DIAGNOSIS — C8251 Diffuse follicle center lymphoma, lymph nodes of head, face, and neck: Secondary | ICD-10-CM | POA: Diagnosis not present

## 2021-12-23 DIAGNOSIS — E871 Hypo-osmolality and hyponatremia: Secondary | ICD-10-CM | POA: Diagnosis not present

## 2021-12-23 DIAGNOSIS — M9701XD Periprosthetic fracture around internal prosthetic right hip joint, subsequent encounter: Secondary | ICD-10-CM | POA: Diagnosis not present

## 2021-12-23 DIAGNOSIS — S72141D Displaced intertrochanteric fracture of right femur, subsequent encounter for closed fracture with routine healing: Secondary | ICD-10-CM | POA: Diagnosis not present

## 2021-12-23 DIAGNOSIS — K219 Gastro-esophageal reflux disease without esophagitis: Secondary | ICD-10-CM | POA: Diagnosis not present

## 2021-12-23 DIAGNOSIS — C8590 Non-Hodgkin lymphoma, unspecified, unspecified site: Secondary | ICD-10-CM | POA: Diagnosis not present

## 2021-12-27 DIAGNOSIS — W19XXXD Unspecified fall, subsequent encounter: Secondary | ICD-10-CM | POA: Diagnosis not present

## 2021-12-27 DIAGNOSIS — E871 Hypo-osmolality and hyponatremia: Secondary | ICD-10-CM | POA: Diagnosis not present

## 2021-12-27 DIAGNOSIS — I10 Essential (primary) hypertension: Secondary | ICD-10-CM | POA: Diagnosis not present

## 2021-12-27 DIAGNOSIS — K219 Gastro-esophageal reflux disease without esophagitis: Secondary | ICD-10-CM | POA: Diagnosis not present

## 2021-12-27 DIAGNOSIS — C8251 Diffuse follicle center lymphoma, lymph nodes of head, face, and neck: Secondary | ICD-10-CM | POA: Diagnosis not present

## 2021-12-27 DIAGNOSIS — C8590 Non-Hodgkin lymphoma, unspecified, unspecified site: Secondary | ICD-10-CM | POA: Diagnosis not present

## 2021-12-27 DIAGNOSIS — S72141D Displaced intertrochanteric fracture of right femur, subsequent encounter for closed fracture with routine healing: Secondary | ICD-10-CM | POA: Diagnosis not present

## 2021-12-27 DIAGNOSIS — K5900 Constipation, unspecified: Secondary | ICD-10-CM | POA: Diagnosis not present

## 2021-12-27 DIAGNOSIS — M9701XD Periprosthetic fracture around internal prosthetic right hip joint, subsequent encounter: Secondary | ICD-10-CM | POA: Diagnosis not present

## 2021-12-27 DIAGNOSIS — D62 Acute posthemorrhagic anemia: Secondary | ICD-10-CM | POA: Diagnosis not present

## 2021-12-28 ENCOUNTER — Other Ambulatory Visit: Payer: Self-pay | Admitting: *Deleted

## 2021-12-28 NOTE — Patient Outreach (Signed)
?[place] [facility]) Care Management ?Telephonic RN Care Manager Note ? ? ?12/28/2021 ?Name:  [name] [name] MRN:  [id number] DOB:  November 14, 1940 ? ?Summary: ?Weekly transition of care call placed to member, successful.  Denies any urgent concerns, encouraged to contact this care manager with questions.   ? ? ?Subjective: ?[name] [name] is an 81 y.o. year old male who is a primary patient of [name], MD. The care management team was consulted for assistance with care management and/or care coordination needs.   ? ?Telephonic RN Care Manager completed Telephone Visit today. ? ?Objective:  ? ?Medications Reviewed Today   ? ? Reviewed by Valente David, RN (Registered Nurse) on 12/05/21 at 1218  Med List Status: <None>  ? ?Medication Order Taking? Sig Documenting Provider Last Dose Status Informant  ?Aflibercept 2 MG/0.05ML SOLN [id number]  Inject 0.5 mLs into the eye as directed. Take every 6 wks in eye doctor office. [provider]  Active Self, Family Member, Pharmacy Records  ?         ?Med Note Gershon Cull, Joanell Rising   Fri Oct 28, 2021  3:20 AM) Gets done at eye doctor   ?alum & mag hydroxide-simeth (MAALOX/MYLANTA) 200-200-20 MG/5ML suspension [id number]  Take 15 mLs by mouth every 6 (six) hours as needed for indigestion or heartburn. Bonnielee Haff, MD  Active   ?amLODipine (NORVASC) 5 MG tablet [id number]  Take 5 mg by mouth daily. [provider]  Active Self, Family Member, Pharmacy Records  ?apixaban (ELIQUIS) 2.5 MG TABS tablet [id number]  Take 1 tablet (2.5 mg total) by mouth 2 (two) times daily. Corinne Ports, PA-C  Expired 12/01/21 2359   ?Ascorbic Acid (VITAMIN C) 1000 MG tablet [id number]  Take 1,000 mg by mouth daily. Pt states he takes 6,'000mg'$  [provider]  Active Self, Family Member, Pharmacy Records  ?         ?Med Note Gloriann Loan, DIANE H   Thu Feb 12, 2020 10:59 AM) "4000 mg /day -powder"  ?bisacodyl (DULCOLAX) 10 MG suppository [id number]  Place 1 suppository (10  mg total) rectally daily as needed for moderate constipation. Bonnielee Haff, MD  Active   ?calcium carbonate (TUMS - DOSED IN MG ELEMENTAL CALCIUM) 500 MG chewable tablet [id number]  Chew 1 tablet (200 mg of elemental calcium total) by mouth 3 (three) times daily as needed for indigestion or heartburn. Bonnielee Haff, MD  Active   ?cholecalciferol (VITAMIN D) 1000 UNITS tablet [id number]  Take 15,000 Units by mouth daily.  [provider]  Active Self, Family Member, Pharmacy Records  ?         ?Med Note Gloriann Loan, DIANE H   Thu Feb 12, 2020 11:00 AM) "takes 10000 iu"  ?famotidine (PEPCID) 10 MG tablet [id number]  Take 1 tablet (10 mg total) by mouth 2 (two) times daily as needed for heartburn or indigestion. Bonnielee Haff, MD  Active   ?feeding supplement (ENSURE ENLIVE / ENSURE PLUS) LIQD [id number]  Take 237 mLs by mouth 3 (three) times daily between meals. Bonnielee Haff, MD  Active   ?heparin lock flush 100 unit/mL [id number]   Curt Bears, MD  Active   ?lisinopril (ZESTRIL) 20 MG tablet [id number]  Take 20 mg by mouth daily. [provider]  Active Self, Family Member, Pharmacy Records  ?magnesium oxide (MAG-OX) 400 MG tablet [id number]  Take 400 mg by mouth daily. "I take '500mg'$ /day" [provider]  Active Self, Family Member, Pharmacy Records  ?         ?  Med Note Gloriann Loan, DIANE H   Thu Feb 12, 2020 10:58 AM) " I take 1000 mg /day"  ?multivitamin-lutein (OCUVITE-LUTEIN) CAPS capsule [id number]  Take 1 capsule by mouth daily. [provider]  Active Pharmacy Records, Family Member, Self  ?Nutritional Supplements (,FEEDING SUPPLEMENT, PROSOURCE PLUS) liquid [id number]  Take 30 mLs by mouth 2 (two) times daily between meals. Bonnielee Haff, MD  Active   ?pantoprazole (PROTONIX) 40 MG tablet [id number]  Take 1 tablet (40 mg total) by mouth 2 (two) times daily before a meal. Bonnielee Haff, MD  Active   ?polyethylene glycol (MIRALAX / GLYCOLAX) 17 g packet [id number]  Take 17 g by  mouth 3 (three) times daily. Bonnielee Haff, MD  Active   ?Probiotic Product (PROBIOTIC FORMULA PO) [id number]  Take 1 tablet by mouth daily. [provider]  Active Self, Family Member, Pharmacy Records  ?senna-docusate (SENOKOT-S) 8.6-50 MG tablet [id number]  Take 2 tablets by mouth 2 (two) times daily. Bonnielee Haff, MD  Active   ?sodium chloride 0.9 % injection 10 mL [id number]   Curt Bears, MD  Active   ?sodium chloride 0.9 % injection 10 mL [id number]   Curt Bears, MD  Active   ?sucralfate (CARAFATE) 1 GM/10ML suspension [id number]  Take 20 mLs (2 g total) by mouth 3 (three) times daily after meals, bedtime and 0200. Bonnielee Haff, MD  Active   ?traMADol Veatrice Bourbon) 50 MG tablet [id number]  Take 1-2 tablets (50-100 mg total) by mouth every 6 (six) hours as needed for moderate pain or severe pain (50 mg moderate pain, 100 mg severe pain). Corinne Ports, PA-C  Active   ?Zinc 30 MG TABS [id number]  Take 1 tablet by mouth every morning. Pt states he takes 60mg. [provider]  Active Self, Family Member, Pharmacy Records  ?         ?Med Note (Elyn Peers  Sat Oct 04, 2019  7:14 PM)    ? ?  ?  ? ?  ? ? ? ?SDOH:  (Social Determinants of Health) assessments and interventions performed:  ? ? ? ?Care Plan ? ?Review of patient past medical history, allergies, medications, health status, including review of consultants reports, laboratory and other test data, was performed as part of comprehensive evaluation for care management services.  ? ?[facility]  ?Updates made by LValente David RN since 12/28/2021 12:00 AM  ?  ? ?Problem: Need for chronic care management due to recent fall resulting in hip fracture requiring ORIF   ?Priority: High  ?  ? ?Long-Range Goal: Member will not have any falls or be readmitted to hospital in the next 3 months   ?Start Date: 12/05/2021  ?Expected End Date: 03/05/2022  ?This Visit's Progress: On track  ?Recent Progress: On track   ?Priority: High  ?Note:   ?Current Barriers:  ?Knowledge Deficits related to plan of care for management of fall/hip fracture  ?Care Coordination needs related to Limited access to food ?Transportation barriers ? ?RNCM Clinical Goal(s):  ?Patient will verbalize understanding of plan for management of fall/hip fracture as evidenced by no recurrent falls ?take all medications exactly as prescribed and will call provider for medication related questions as evidenced by member reported compliance ?attend all scheduled medical appointments: Ortho, GI, and PCP as evidenced by Member reported attendance and EMR records ?work with community resource care guide to address needs related to  Limited access to food as evidenced by patient and/or community  resource care guide support ?work with home health PT/OT to increase strength and independence as evidenced by ability to care for self in the home  through collaboration with Consulting civil engineer, provider, and care team.  ? ?Interventions: ?Inter-disciplinary care team collaboration (see longitudinal plan of care) ?Evaluation of current treatment plan related to  self management and patient's adherence to plan as established by provider ? ? ?Falls Interventions:  (Status:  Goal on track:  Yes.) Long Term Goal ?Provided written and verbal education re: potential causes of falls and Fall prevention strategies ?Advised patient of importance of notifying provider of falls ?Assessed for falls since last encounter ? ? ? ?Surgery (ORIF):  (Status: Goal on track:  Yes.) Long Term Goal ?Evaluation of current treatment plan related to ORIF surgery ?assessed patient/caregiver understanding of surgical procedure   ?addressed questions about post - surgical incision care  ?reviewed medications with patient and addressed questions ?performed PHQ2-PHQ9 assessment    ? ?Patient Goals/Self-Care Activities: ?Take all medications as prescribed ?Attend all scheduled provider appointments ?Perform  all self care activities independently  ? ?Follow Up Plan:  The patient has been provided with contact information for the care management team and has been advised to call with any health related questio

## 2021-12-29 ENCOUNTER — Encounter: Payer: Self-pay | Admitting: Gastroenterology

## 2021-12-29 ENCOUNTER — Ambulatory Visit (INDEPENDENT_AMBULATORY_CARE_PROVIDER_SITE_OTHER): Payer: Medicare HMO | Admitting: Gastroenterology

## 2021-12-29 VITALS — BP 106/76 | HR 61 | Ht 69.0 in | Wt 163.1 lb

## 2021-12-29 DIAGNOSIS — K2210 Ulcer of esophagus without bleeding: Secondary | ICD-10-CM

## 2021-12-29 NOTE — Progress Notes (Signed)
? ?Chief Complaint:    Hospital follow-up, erosive esophagitis ? ? ?HPI:   ? ? ?Patient is a 81 y.o. male presenting to the [facility] for hospital follow-up.  He was admitted in 10/2021 with right hip fracture and subsequent developed odynophagia. ?- Suboptimal improvement despite adequate trial of high-dose IV PPI, Carafate, viscous lidocaine ?- EGD (11/03/2021): LA grade D esophagitis (path: Ulcerative esophagitis without infection).  Normal stomach/duodenum.  Treated with Protonix 40 mg twice daily x8 weeks, sucralfate suspension 4 times daily x4 weeks, viscous lidocaine before meals and at bedtime with plan for repeat upper endoscopy in 8 weeks ?- 11/10/2021: Discharged from hospital to SNF ? ?Today, he states he is feeling better.  Tolerating p.o. intake.  Is scheduled for repeat upper endoscopy on 01/17/2022 to evaluate for appropriate healing of erosive esophagitis.  He is otherwise without any GI issues today. He stopped taking te Protonix since d/c from SNF, and no breakthrough sxs. Similarly, no longer taking Carafate.  ? ?Currently using Miralax daily to facilitate BM, with improvement since leaving SNF.  Prior to hospitalization, no issues with constipation. ? ? ?Review of systems:     No chest pain, no SOB, no fevers, no urinary sx  ? ?Past Medical History:  ?Diagnosis Date  ? Basal cell carcinoma 06/08/1998  ? Upper lip - CX3+5FU  ? Basal cell carcinoma 06/22/1998  ? Left front neck - CX3+5FU  ? Basal cell carcinoma 03/11/2004  ? Right temple [facility])  ? Basal cell carcinoma 04/24/2016  ? Left top shoulder - Clear  ? Basal cell carcinoma 01/14/2018  ? Right sideburn - CX3+5FU  ? Basal cell carcinoma 06/08/2020  ? sup & nod- left postauricular sulcus  ? Complication of anesthesia   ? Femur fracture ([place])   ? Hip fracture ([place])   ? Hypertension   ? Malignant neoplasm of pancreas, part unspecified   ? 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  ? Right Ear Superior  ? Nodular  infiltrative basal cell carcinoma (BCC) 04/06/2020  ? Right Sideburn  ? Other malignant lymphomas, unspecified site, extranodal and solid organ sites   ? Squamous cell carcinoma of skin 09/26/2001  ? Left forehead - CX3+excision  ? Squamous cell carcinoma of skin 03/11/2004  ? Post right scalp - CX3+5FU  ? Squamous cell carcinoma of skin 03/11/2004  ? Left forehead [facility])  ? Squamous cell carcinoma of skin 03/11/2004  ? Right sideburn - CX3+5FU+excision  ? Squamous cell carcinoma of skin 12/01/2004  ? Left temple - CX3+excision  ? Squamous cell carcinoma of skin 04/13/2006  ? Mid scalp, sup - tx p bx  ? Squamous cell carcinoma of skin 04/13/2006  ? Mid scalp, inf - tx p bx  ? Squamous cell carcinoma of skin 04/13/2006  ? Left temple - tx p bx  ? Squamous cell carcinoma of skin 06/30/2010  ? Left low back - tx p bx  ? Squamous cell carcinoma of skin 06/23/2013  ? Front scalp - CX3+5FU  ? Squamous cell carcinoma of skin 01/14/2018  ? Left scalp - CX3+5FU  ? ? ?Patient's surgical history, family medical history, social history, medications and allergies were all reviewed in Epic  ? ? ?Current Outpatient Medications  ?Medication Sig Dispense Refill  ? Aflibercept 2 MG/0.05ML SOLN Inject 0.5 mLs into the eye as directed. Take every 12 wks in eye doctor office.    ? alum & mag hydroxide-simeth (MAALOX/MYLANTA) 200-200-20 MG/5ML suspension Take 15 mLs by mouth every  6 (six) hours as needed for indigestion or heartburn. 355 mL 0  ? AMBULATORY NON FORMULARY MEDICATION 3 tablets 2 (two) times daily. Medication Name: Gifford Shave supplement    ? amLODipine (NORVASC) 5 MG tablet Take 5 mg by mouth daily.    ? Ascorbic Acid (VITAMIN C) 1000 MG tablet Take 4,000 mg by mouth daily. Pt states he takes 4,'000mg'$     ? bisacodyl (DULCOLAX) 10 MG suppository Place 1 suppository (10 mg total) rectally daily as needed for moderate constipation. (Patient taking differently: Place 10 mg rectally as needed for moderate constipation.) 12  suppository 0  ? cholecalciferol (VITAMIN D) 1000 UNITS tablet Take 15,000 Units by mouth daily.     ? lisinopril (ZESTRIL) 20 MG tablet Take 20 mg by mouth daily.    ? magnesium oxide (MAG-OX) 400 MG tablet Take 400 mg by mouth daily. "I take '500mg'$ /day"    ? Menaquinone-7 (VITAMIN K2 PO) Take 500 mcg by mouth daily in the afternoon.    ? Turmeric (QC TUMERIC COMPLEX PO) Take 1 tablet by mouth daily in the afternoon.    ? Zinc 50 MG TABS Take 1 tablet by mouth daily in the afternoon.    ? feeding supplement (ENSURE ENLIVE / ENSURE PLUS) LIQD Take 237 mLs by mouth 3 (three) times daily between meals. (Patient not taking: Reported on 12/29/2021) 237 mL 12  ? Nutritional Supplements (,FEEDING SUPPLEMENT, PROSOURCE PLUS) liquid Take 30 mLs by mouth 2 (two) times daily between meals. (Patient not taking: Reported on 12/05/2021)    ? senna-docusate (SENOKOT-S) 8.6-50 MG tablet Take 2 tablets by mouth 2 (two) times daily. (Patient not taking: Reported on 12/29/2021)    ? ?No current facility-administered medications for this visit.  ? ?Facility-Administered Medications Ordered in Other Visits  ?Medication Dose Route Frequency Provider Last Rate Last Admin  ? heparin lock flush 100 unit/mL  500 Units Intravenous Once PRN Curt Bears, MD      ? sodium chloride 0.9 % injection 10 mL  10 mL Intravenous PRN Curt Bears, MD   10 mL at 12/29/14 1026  ? sodium chloride 0.9 % injection 10 mL  10 mL Intravenous PRN Curt Bears, MD      ? ? ?Physical Exam:   ? ? ?Ht '5\' 9"'$  (1.753 m)   Wt 163 lb 2 oz (74 kg)   BMI 24.09 kg/m?  ? ?GENERAL:  Pleasant male in NAD.  Walks with walker ?PSYCH: : Cooperative, normal affect ?CARDIAC:  RRR, no murmur heard, no peripheral edema ?PULM: Normal respiratory effort, lungs CTA bilaterally, no wheezing ?ABDOMEN:  Nondistended, soft, nontender. No obvious masses, no hepatomegaly,  normal bowel sounds ?SKIN:  turgor, no lesions seen ?NEURO: Alert and oriented x 3 ? ? ?IMPRESSION and PLAN:    ? ?1) Erosive Esophagitis/Esophageal ulceration ?- Back to tolerating all p.o. intake without issue and has subsequently stopped taking PPI without return of index symptoms ?- Repeat EGD next month as scheduled to evaluate for resolution of esophagitis ?- Continue p.o. intake as tolerated ? ?2) Hip fracture ?- Improving ambulation with walker assist, PT exercises ?  ?The indications, risks, and benefits of EGD were explained to the patient in detail. Risks include but are not limited to bleeding, perforation, adverse reaction to medications, and cardiopulmonary compromise. Sequelae include but are not limited to the possibility of surgery, hospitalization, and mortality. The patient verbalized understanding and wished to proceed. All questions answered, referred to scheduler. Further recommendations pending results of the exam.  ?   ?    ? ?  Lavena Bullion ,DO, FACG 12/29/2021, 11:26 AM ? ?

## 2021-12-29 NOTE — Patient Instructions (Addendum)
If you are age 82 or older, your body mass index should be between 23-30. Your Body mass index is 24.09 kg/m?Marland Kitchen If this is out of the aforementioned range listed, please consider follow up with your Primary Care Provider. ? ?__________________________________________________________ ? ?The [place] [facility] providers would like to encourage you to use [facility] to communicate with providers for non-urgent requests or questions.  Due to long hold times on the telephone, sending your provider a message by [facility] may be a faster and more efficient way to get a response.  Please allow 48 business hours for a response.  Please remember that this is for non-urgent requests.   ? ?Due to recent changes in healthcare laws, you may see the results of your imaging and laboratory studies on MyChart before your provider has had a chance to review them.  We understand that in some cases there may be results that are confusing or concerning to you. Not all laboratory results come back in the same time frame and the provider may be waiting for multiple results in order to interpret others.  Please give [place] 48 hours in order for your provider to thoroughly review all the results before contacting the office for clarification of your results.  ? ?You are already scheduled for EGD procedure at the hospital on 01/17/22 @ 11.15am.  ? ? ?Thank you for choosing me and [facility]. ? ?Gerrit Heck, D.O.  ? ? ? ? ?We want to thank you for trusting [facility] with your care. All of our staff and providers value the relationships we have built with our patients, and it is an honor to care for you.  ? ?We are writing to let you know that [facility] [place] will close on Jan 30, 2022, and we invite you to continue to see Dr. Carmell Austria and Gerrit Heck at the [facility] [place] office location. We are consolidating our serices at these [facility] practices to better provide care. Our  office staff will work with you to ensure a seamless transition.  ? ?Gerrit Heck, DO -Dr. Bryan Lemma will be movig to [facility] at [address] [place], [place] [address], effective Jan 30, 2022.  Contact [phone] to schedule an appointment with him.  ? ?Carmell Austria, MD- Dr. Lyndel Safe will be movig to [facility] at [address], [place], [place] [address], effective Jan 30, 2022.  Contact [phone] to schedule an appointment with him.  ? ?Requesting Medical Records ?If you need to request your medical records, please follow the instructions below. Your medical records are confidential, and a copy can be transferred to another provider or released to you or another person you designate only with your permission. ? ?There are several ways to request your medical records: ?Requests for medical records can be submitted through our practice.   ?You can also request your records electronically, in your MyChart account by selecting the ?Request Health Records? tab.  ?If you need additional information on how to request records, please go to [email or web address]/, choose Patient Information, then select Request Medical Records. ?To make an appointment or if you have any questions about your health care needs, please contact our office at [phone] and one of our staff members will be glad to assist you. ?[facility] is committed to providing exceptional care for you and our community. Thank you for allowing [place] to serve your health care needs. ?Sincerely, ? ?Windy Canny, Director [facility] ?[facility] also offers  convenient virtual care options. Sore throat? Sinus problems? Cold or flu symptoms? Get care from the comfort of home with [facility] Video Visits and e-Visits. Learn more about the non-emergency conditions treated and start your virtual visit at [email or web address]  ?

## 2021-12-29 NOTE — H&P (View-Only) (Signed)
? ?Chief Complaint:    Hospital follow-up, erosive esophagitis ? ? ?HPI:   ? ? ?Patient is a 81 y.o. male presenting to the Gastroenterology Clinic for hospital follow-up.  He was admitted in 10/2021 with right hip fracture and subsequent developed odynophagia. ?- Suboptimal improvement despite adequate trial of high-dose IV PPI, Carafate, viscous lidocaine ?- EGD (11/03/2021): LA grade D esophagitis (path: Ulcerative esophagitis without infection).  Normal stomach/duodenum.  Treated with Protonix 40 mg twice daily x8 weeks, sucralfate suspension 4 times daily x4 weeks, viscous lidocaine before meals and at bedtime with plan for repeat upper endoscopy in 8 weeks ?- 11/10/2021: Discharged from hospital to SNF ? ?Today, he states he is feeling better.  Tolerating p.o. intake.  Is scheduled for repeat upper endoscopy on 01/17/2022 to evaluate for appropriate healing of erosive esophagitis.  He is otherwise without any GI issues today. He stopped taking te Protonix since d/c from SNF, and no breakthrough sxs. Similarly, no longer taking Carafate.  ? ?Currently using Miralax daily to facilitate BM, with improvement since leaving SNF.  Prior to hospitalization, no issues with constipation. ? ? ?Review of systems:     No chest pain, no SOB, no fevers, no urinary sx  ? ?Past Medical History:  ?Diagnosis Date  ? Basal cell carcinoma 06/08/1998  ? Upper lip - CX3+5FU  ? Basal cell carcinoma 06/22/1998  ? Left front neck - CX3+5FU  ? Basal cell carcinoma 03/11/2004  ? Right temple [facility])  ? Basal cell carcinoma 04/24/2016  ? Left top shoulder - Clear  ? Basal cell carcinoma 01/14/2018  ? Right sideburn - CX3+5FU  ? Basal cell carcinoma 06/08/2020  ? sup & nod- left postauricular sulcus  ? Complication of anesthesia   ? Femur fracture ([place])   ? Hip fracture ([place])   ? Hypertension   ? Malignant neoplasm of pancreas, part unspecified   ? 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  ? Right Ear Superior  ? Nodular  infiltrative basal cell carcinoma (BCC) 04/06/2020  ? Right Sideburn  ? Other malignant lymphomas, unspecified site, extranodal and solid organ sites   ? Squamous cell carcinoma of skin 09/26/2001  ? Left forehead - CX3+excision  ? Squamous cell carcinoma of skin 03/11/2004  ? Post right scalp - CX3+5FU  ? Squamous cell carcinoma of skin 03/11/2004  ? Left forehead [facility])  ? Squamous cell carcinoma of skin 03/11/2004  ? Right sideburn - CX3+5FU+excision  ? Squamous cell carcinoma of skin 12/01/2004  ? Left temple - CX3+excision  ? Squamous cell carcinoma of skin 04/13/2006  ? Mid scalp, sup - tx p bx  ? Squamous cell carcinoma of skin 04/13/2006  ? Mid scalp, inf - tx p bx  ? Squamous cell carcinoma of skin 04/13/2006  ? Left temple - tx p bx  ? Squamous cell carcinoma of skin 06/30/2010  ? Left low back - tx p bx  ? Squamous cell carcinoma of skin 06/23/2013  ? Front scalp - CX3+5FU  ? Squamous cell carcinoma of skin 01/14/2018  ? Left scalp - CX3+5FU  ? ? ?Patient's surgical history, family medical history, social history, medications and allergies were all reviewed in Epic  ? ? ?Current Outpatient Medications  ?Medication Sig Dispense Refill  ? Aflibercept 2 MG/0.05ML SOLN Inject 0.5 mLs into the eye as directed. Take every 12 wks in eye doctor office.    ? alum & mag hydroxide-simeth (MAALOX/MYLANTA) 200-200-20 MG/5ML suspension Take 15 mLs by mouth every  6 (six) hours as needed for indigestion or heartburn. 355 mL 0  ? AMBULATORY NON FORMULARY MEDICATION 3 tablets 2 (two) times daily. Medication Name: Gifford Shave supplement    ? amLODipine (NORVASC) 5 MG tablet Take 5 mg by mouth daily.    ? Ascorbic Acid (VITAMIN C) 1000 MG tablet Take 4,000 mg by mouth daily. Pt states he takes 4,'000mg'$     ? bisacodyl (DULCOLAX) 10 MG suppository Place 1 suppository (10 mg total) rectally daily as needed for moderate constipation. (Patient taking differently: Place 10 mg rectally as needed for moderate constipation.) 12  suppository 0  ? cholecalciferol (VITAMIN D) 1000 UNITS tablet Take 15,000 Units by mouth daily.     ? lisinopril (ZESTRIL) 20 MG tablet Take 20 mg by mouth daily.    ? magnesium oxide (MAG-OX) 400 MG tablet Take 400 mg by mouth daily. "I take '500mg'$ /day"    ? Menaquinone-7 (VITAMIN K2 PO) Take 500 mcg by mouth daily in the afternoon.    ? Turmeric (QC TUMERIC COMPLEX PO) Take 1 tablet by mouth daily in the afternoon.    ? Zinc 50 MG TABS Take 1 tablet by mouth daily in the afternoon.    ? feeding supplement (ENSURE ENLIVE / ENSURE PLUS) LIQD Take 237 mLs by mouth 3 (three) times daily between meals. (Patient not taking: Reported on 12/29/2021) 237 mL 12  ? Nutritional Supplements (,FEEDING SUPPLEMENT, PROSOURCE PLUS) liquid Take 30 mLs by mouth 2 (two) times daily between meals. (Patient not taking: Reported on 12/05/2021)    ? senna-docusate (SENOKOT-S) 8.6-50 MG tablet Take 2 tablets by mouth 2 (two) times daily. (Patient not taking: Reported on 12/29/2021)    ? ?No current facility-administered medications for this visit.  ? ?Facility-Administered Medications Ordered in Other Visits  ?Medication Dose Route Frequency Provider Last Rate Last Admin  ? heparin lock flush 100 unit/mL  500 Units Intravenous Once PRN Curt Bears, MD      ? sodium chloride 0.9 % injection 10 mL  10 mL Intravenous PRN Curt Bears, MD   10 mL at 12/29/14 1026  ? sodium chloride 0.9 % injection 10 mL  10 mL Intravenous PRN Curt Bears, MD      ? ? ?Physical Exam:   ? ? ?Ht '5\' 9"'$  (1.753 m)   Wt 163 lb 2 oz (74 kg)   BMI 24.09 kg/m?  ? ?GENERAL:  Pleasant male in NAD.  Walks with walker ?PSYCH: : Cooperative, normal affect ?CARDIAC:  RRR, no murmur heard, no peripheral edema ?PULM: Normal respiratory effort, lungs CTA bilaterally, no wheezing ?ABDOMEN:  Nondistended, soft, nontender. No obvious masses, no hepatomegaly,  normal bowel sounds ?SKIN:  turgor, no lesions seen ?NEURO: Alert and oriented x 3 ? ? ?IMPRESSION and PLAN:    ? ?1) Erosive Esophagitis/Esophageal ulceration ?- Back to tolerating all p.o. intake without issue and has subsequently stopped taking PPI without return of index symptoms ?- Repeat EGD next month as scheduled to evaluate for resolution of esophagitis ?- Continue p.o. intake as tolerated ? ?2) Hip fracture ?- Improving ambulation with walker assist, PT exercises ?  ?The indications, risks, and benefits of EGD were explained to the patient in detail. Risks include but are not limited to bleeding, perforation, adverse reaction to medications, and cardiopulmonary compromise. Sequelae include but are not limited to the possibility of surgery, hospitalization, and mortality. The patient verbalized understanding and wished to proceed. All questions answered, referred to scheduler. Further recommendations pending results of the exam.  ?   ?    ? ?  Lavena Bullion ,DO, FACG 12/29/2021, 11:26 AM ? ?

## 2022-01-02 DIAGNOSIS — N529 Male erectile dysfunction, unspecified: Secondary | ICD-10-CM | POA: Diagnosis not present

## 2022-01-02 DIAGNOSIS — C8251 Diffuse follicle center lymphoma, lymph nodes of head, face, and neck: Secondary | ICD-10-CM | POA: Diagnosis not present

## 2022-01-02 DIAGNOSIS — S72141D Displaced intertrochanteric fracture of right femur, subsequent encounter for closed fracture with routine healing: Secondary | ICD-10-CM | POA: Diagnosis not present

## 2022-01-02 DIAGNOSIS — E871 Hypo-osmolality and hyponatremia: Secondary | ICD-10-CM | POA: Diagnosis not present

## 2022-01-02 DIAGNOSIS — K219 Gastro-esophageal reflux disease without esophagitis: Secondary | ICD-10-CM | POA: Diagnosis not present

## 2022-01-02 DIAGNOSIS — C8590 Non-Hodgkin lymphoma, unspecified, unspecified site: Secondary | ICD-10-CM | POA: Diagnosis not present

## 2022-01-02 DIAGNOSIS — Z809 Family history of malignant neoplasm, unspecified: Secondary | ICD-10-CM | POA: Diagnosis not present

## 2022-01-02 DIAGNOSIS — D62 Acute posthemorrhagic anemia: Secondary | ICD-10-CM | POA: Diagnosis not present

## 2022-01-02 DIAGNOSIS — M9701XD Periprosthetic fracture around internal prosthetic right hip joint, subsequent encounter: Secondary | ICD-10-CM | POA: Diagnosis not present

## 2022-01-02 DIAGNOSIS — Z008 Encounter for other general examination: Secondary | ICD-10-CM | POA: Diagnosis not present

## 2022-01-02 DIAGNOSIS — H3530 Unspecified macular degeneration: Secondary | ICD-10-CM | POA: Diagnosis not present

## 2022-01-02 DIAGNOSIS — E785 Hyperlipidemia, unspecified: Secondary | ICD-10-CM | POA: Diagnosis not present

## 2022-01-02 DIAGNOSIS — W19XXXD Unspecified fall, subsequent encounter: Secondary | ICD-10-CM | POA: Diagnosis not present

## 2022-01-02 DIAGNOSIS — I77819 Aortic ectasia, unspecified site: Secondary | ICD-10-CM | POA: Diagnosis not present

## 2022-01-02 DIAGNOSIS — Z8571 Personal history of Hodgkin lymphoma: Secondary | ICD-10-CM | POA: Diagnosis not present

## 2022-01-02 DIAGNOSIS — Z85828 Personal history of other malignant neoplasm of skin: Secondary | ICD-10-CM | POA: Diagnosis not present

## 2022-01-02 DIAGNOSIS — I10 Essential (primary) hypertension: Secondary | ICD-10-CM | POA: Diagnosis not present

## 2022-01-02 DIAGNOSIS — R32 Unspecified urinary incontinence: Secondary | ICD-10-CM | POA: Diagnosis not present

## 2022-01-02 DIAGNOSIS — K5900 Constipation, unspecified: Secondary | ICD-10-CM | POA: Diagnosis not present

## 2022-01-02 DIAGNOSIS — M1990 Unspecified osteoarthritis, unspecified site: Secondary | ICD-10-CM | POA: Diagnosis not present

## 2022-01-04 DIAGNOSIS — Z741 Need for assistance with personal care: Secondary | ICD-10-CM | POA: Diagnosis not present

## 2022-01-04 DIAGNOSIS — I10 Essential (primary) hypertension: Secondary | ICD-10-CM | POA: Diagnosis not present

## 2022-01-04 DIAGNOSIS — C8590 Non-Hodgkin lymphoma, unspecified, unspecified site: Secondary | ICD-10-CM | POA: Diagnosis not present

## 2022-01-04 DIAGNOSIS — K219 Gastro-esophageal reflux disease without esophagitis: Secondary | ICD-10-CM | POA: Diagnosis not present

## 2022-01-04 DIAGNOSIS — W19XXXD Unspecified fall, subsequent encounter: Secondary | ICD-10-CM | POA: Diagnosis not present

## 2022-01-04 DIAGNOSIS — M9701XD Periprosthetic fracture around internal prosthetic right hip joint, subsequent encounter: Secondary | ICD-10-CM | POA: Diagnosis not present

## 2022-01-04 DIAGNOSIS — Z79891 Long term (current) use of opiate analgesic: Secondary | ICD-10-CM | POA: Diagnosis not present

## 2022-01-04 DIAGNOSIS — Z9181 History of falling: Secondary | ICD-10-CM | POA: Diagnosis not present

## 2022-01-04 DIAGNOSIS — E871 Hypo-osmolality and hyponatremia: Secondary | ICD-10-CM | POA: Diagnosis not present

## 2022-01-04 DIAGNOSIS — C8251 Diffuse follicle center lymphoma, lymph nodes of head, face, and neck: Secondary | ICD-10-CM | POA: Diagnosis not present

## 2022-01-04 DIAGNOSIS — Z7409 Other reduced mobility: Secondary | ICD-10-CM | POA: Diagnosis not present

## 2022-01-04 DIAGNOSIS — S72141D Displaced intertrochanteric fracture of right femur, subsequent encounter for closed fracture with routine healing: Secondary | ICD-10-CM | POA: Diagnosis not present

## 2022-01-04 DIAGNOSIS — D62 Acute posthemorrhagic anemia: Secondary | ICD-10-CM | POA: Diagnosis not present

## 2022-01-04 DIAGNOSIS — K5900 Constipation, unspecified: Secondary | ICD-10-CM | POA: Diagnosis not present

## 2022-01-05 ENCOUNTER — Other Ambulatory Visit: Payer: Self-pay | Admitting: *Deleted

## 2022-01-05 NOTE — Patient Outreach (Signed)
?[facility]) Care Management ?Telephonic RN Care Manager Note ? ? ?01/05/2022 ?Name:  [name] [name] MRN:  [id number] DOB:  [birth year] ? ?Summary: ?Outgoing call placed to member, successful.  Denies any urgent concerns, encouraged to contact this care manager with questions.   ? ?Subjective: ?[name] [name] is an 81 y.o. year old male who is a primary patient of [name], MD. The care management team was consulted for assistance with care management and/or care coordination needs.   ? ?Telephonic RN Care Manager completed Telephone Visit today. ? ?Objective:  ? ?Medications Reviewed Today   ? ? Reviewed by Lavena Bullion, DO (Physician) on 12/29/21 at 1130  Med List Status: <None>  ? ?Medication Order Taking? Sig Documenting Provider Last Dose Status Informant  ?Aflibercept 2 MG/0.05ML SOLN [id number] Yes Inject 0.5 mLs into the eye as directed. Take every 12 wks in eye doctor office. [provider] Taking Active Self, Family Member, Pharmacy Records  ?         ?Med Note Gershon Cull, Joanell Rising   Fri Oct 28, 2021  3:20 AM) Gets done at eye doctor   ?alum & mag hydroxide-simeth (MAALOX/MYLANTA) 200-200-20 MG/5ML suspension [id number] Yes Take 15 mLs by mouth every 6 (six) hours as needed for indigestion or heartburn. Bonnielee Haff, MD Taking Active   ?AMBULATORY NON FORMULARY MEDICATION [id number] Yes 3 tablets 2 (two) times daily. Medication Name: Vollara supplement [provider] Taking Active   ?amLODipine (NORVASC) 5 MG tablet [id number] Yes Take 5 mg by mouth daily. [provider] Taking Active Self, Family Member, Pharmacy Records  ?Ascorbic Acid (VITAMIN C) 1000 MG tablet [id number] Yes Take 4,000 mg by mouth daily. Pt states he takes 4,'000mg'$  [provider] Taking Active Self, Family Member, Pharmacy Records  ?         ?Med Note Gloriann Loan, DIANE H   Thu Feb 12, 2020 10:59 AM) "4000 mg /day -powder"  ?bisacodyl (DULCOLAX) 10 MG suppository [id number] Yes  Place 1 suppository (10 mg total) rectally daily as needed for moderate constipation.  ?Patient taking differently: Place 10 mg rectally as needed for moderate constipation.  ? Bonnielee Haff, MD Taking Active   ?cholecalciferol (VITAMIN D) 1000 UNITS tablet [id number] Yes Take 15,000 Units by mouth daily.  [provider] Taking Active Self, Family Member, Pharmacy Records  ?         ?Med Note Gloriann Loan, DIANE H   Thu Feb 12, 2020 11:00 AM) "takes 10000 iu"  ?feeding supplement (ENSURE ENLIVE / ENSURE PLUS) LIQD [id number] No Take 237 mLs by mouth 3 (three) times daily between meals.  ?Patient not taking: Reported on 12/29/2021  ? Bonnielee Haff, MD Not Taking Active   ?heparin lock flush 100 unit/mL [id number]   Curt Bears, MD  Active   ?lisinopril (ZESTRIL) 20 MG tablet [id number] Yes Take 20 mg by mouth daily. [provider] Taking Active Self, Family Member, Pharmacy Records  ?magnesium oxide (MAG-OX) 400 MG tablet [id number] Yes Take 400 mg by mouth daily. "I take '500mg'$ /day" [provider] Taking Active Self, Family Member, Pharmacy Records  ?         ?Med Note Ardeen Garland   Thu Feb 12, 2020 10:58 AM) " I take 1000 mg /day"  ?Menaquinone-7 (VITAMIN K2 PO) [id number] Yes Take 500 mcg by mouth daily in the afternoon. [provider] Taking Active   ?Nutritional Supplements (,FEEDING SUPPLEMENT, PROSOURCE PLUS) liquid [id number] No Take 30 mLs by mouth  2 (two) times daily between meals.  ?Patient not taking: Reported on 12/05/2021  ? Bonnielee Haff, MD Not Taking Active   ?senna-docusate (SENOKOT-S) 8.6-50 MG tablet [id number] No Take 2 tablets by mouth 2 (two) times daily.  ?Patient not taking: Reported on 12/29/2021  ? Bonnielee Haff, MD Not Taking Active   ?sodium chloride 0.9 % injection 10 mL [id number]   Curt Bears, MD  Active   ?sodium chloride 0.9 % injection 10 mL [id number]   Curt Bears, MD  Active   ?Turmeric (QC TUMERIC COMPLEX PO) [id number] Yes Take 1  tablet by mouth daily in the afternoon. [provider] Taking Active   ?Zinc 50 MG TABS [id number] Yes Take 1 tablet by mouth daily in the afternoon. [provider] Taking Active   ? ?  ?  ? ?  ? ? ? ?SDOH:  (Social Determinants of Health) assessments and interventions performed:  ? ? ? ?Care Plan ? ?Review of patient past medical history, allergies, medications, health status, including review of consultants reports, laboratory and other test data, was performed as part of comprehensive evaluation for care management services.  ? ?Care Plan : [facility] Plan of Care (Adult)  ?Updates made by Valente David, RN since 01/05/2022 12:00 AM  ?  ? ?Problem: Need for chronic care management due to recent fall resulting in hip fracture requiring ORIF   ?Priority: High  ?  ? ?Long-Range Goal: Member will not have any falls or be readmitted to hospital in the next 3 months   ?Start Date: 12/05/2021  ?Expected End Date: 03/05/2022  ?This Visit's Progress: On track  ?Recent Progress: On track  ?Priority: High  ?Note:   ?Current Barriers:  ?Knowledge Deficits related to plan of care for management of fall/hip fracture  ?Care Coordination needs related to Limited access to food ?Transportation barriers ? ?RNCM Clinical Goal(s):  ?Patient will verbalize understanding of plan for management of fall/hip fracture as evidenced by no recurrent falls ?take all medications exactly as prescribed and will call provider for medication related questions as evidenced by member reported compliance ?attend all scheduled medical appointments: Ortho, GI, and PCP as evidenced by Member reported attendance and EMR records ?work with community resource care guide to address needs related to  Limited access to food as evidenced by patient and/or community resource care guide support ?work with home health PT/OT to increase strength and independence as evidenced by ability to care for self in the home  through collaboration with Consulting civil engineer, provider, and care team.  ? ?Interventions: ?Inter-disciplinary care team collaboration (see longitudinal plan of care) ?Evaluation of current treatment plan related to  self management and patient's adherence to plan as established by provider ? ? ?Falls Interventions:  (Status:  Goal on track:  Yes.) Long Term Goal ?Provided written and verbal education re: potential causes of falls and Fall prevention strategies ?Advised patient of importance of notifying provider of falls ?Assessed for falls since last encounter ? ? ? ?Surgery (ORIF):  (Status: Goal on track:  Yes.) Long Term Goal ?Evaluation of current treatment plan related to ORIF surgery ?assessed patient/caregiver understanding of surgical procedure   ?addressed questions about post - surgical incision care  ?reviewed medications with patient and addressed questions ?performed PHQ2-PHQ9 assessment    ? ?Patient Goals/Self-Care Activities: ?Take all medications as prescribed ?Attend all scheduled provider appointments ?Perform all self care activities independently  ? ?Follow Up Plan:  The patient has been provided with contact information for  the care management team and has been advised to call with any health related questions or concerns.  ? ? ?Update 3/27 - Member report he is doing well since discharge.  Continues to improve on pain control, state he's only taking medication now in the evening.  He has had some constipation, last bowel movement 6 days ago.  He has taken MOM as well as a laxative for relief.  Advised to consider stool softeners such as Dulcolax or sennokot, agrees.  Will see his PCP on Wednesday, transportation already secure.  He is still in need of a long term plan for transportation and food resources, reminded to reach out to care guide, contact information for [place] provided.  Home health nurse and PT remains active, no signs of infection, no falls since last outreach. ? ? ?Update 4/12 - Member state he is doing much  better.  Had follow up with PCP last week, report that visit went well.  He was seen by ortho specialist yesterday, report healing well from fracture and surgery.  He is still having to take Miralax twice a day to k

## 2022-01-10 ENCOUNTER — Encounter (HOSPITAL_COMMUNITY): Payer: Self-pay | Admitting: Gastroenterology

## 2022-01-10 NOTE — Progress Notes (Signed)
Attempted to obtain medical history via telephone, unable to reach at this time. I left a voicemail to return pre surgical testing department's phone call.  

## 2022-01-11 DIAGNOSIS — C8251 Diffuse follicle center lymphoma, lymph nodes of head, face, and neck: Secondary | ICD-10-CM | POA: Diagnosis not present

## 2022-01-11 DIAGNOSIS — M9701XD Periprosthetic fracture around internal prosthetic right hip joint, subsequent encounter: Secondary | ICD-10-CM | POA: Diagnosis not present

## 2022-01-11 DIAGNOSIS — Z79891 Long term (current) use of opiate analgesic: Secondary | ICD-10-CM | POA: Diagnosis not present

## 2022-01-11 DIAGNOSIS — I10 Essential (primary) hypertension: Secondary | ICD-10-CM | POA: Diagnosis not present

## 2022-01-11 DIAGNOSIS — Z7409 Other reduced mobility: Secondary | ICD-10-CM | POA: Diagnosis not present

## 2022-01-11 DIAGNOSIS — K5900 Constipation, unspecified: Secondary | ICD-10-CM | POA: Diagnosis not present

## 2022-01-11 DIAGNOSIS — K219 Gastro-esophageal reflux disease without esophagitis: Secondary | ICD-10-CM | POA: Diagnosis not present

## 2022-01-11 DIAGNOSIS — C8590 Non-Hodgkin lymphoma, unspecified, unspecified site: Secondary | ICD-10-CM | POA: Diagnosis not present

## 2022-01-11 DIAGNOSIS — Z9181 History of falling: Secondary | ICD-10-CM | POA: Diagnosis not present

## 2022-01-11 DIAGNOSIS — W19XXXD Unspecified fall, subsequent encounter: Secondary | ICD-10-CM | POA: Diagnosis not present

## 2022-01-11 DIAGNOSIS — E871 Hypo-osmolality and hyponatremia: Secondary | ICD-10-CM | POA: Diagnosis not present

## 2022-01-11 DIAGNOSIS — D62 Acute posthemorrhagic anemia: Secondary | ICD-10-CM | POA: Diagnosis not present

## 2022-01-11 DIAGNOSIS — Z741 Need for assistance with personal care: Secondary | ICD-10-CM | POA: Diagnosis not present

## 2022-01-11 DIAGNOSIS — S72141D Displaced intertrochanteric fracture of right femur, subsequent encounter for closed fracture with routine healing: Secondary | ICD-10-CM | POA: Diagnosis not present

## 2022-01-16 NOTE — Anesthesia Preprocedure Evaluation (Addendum)
Anesthesia Evaluation  ?Patient identified by MRN, date of birth, ID band ?Patient awake ? ? ? ?Reviewed: ?Allergy & Precautions, NPO status , Patient's Chart, lab work & pertinent test results ? ?Airway ?Mallampati: III ? ?TM Distance: >3 FB ?Neck ROM: Full ? ? ? Dental ? ?(+) Poor Dentition, Chipped, Dental Advisory Given ?  ?Pulmonary ?neg pulmonary ROS,  ?  ?Pulmonary exam normal ?breath sounds clear to auscultation ? ? ? ? ? ? Cardiovascular ?hypertension, Pt. on medications ?Normal cardiovascular exam ?Rhythm:Regular Rate:Normal ? ? ?  ?Neuro/Psych ?negative neurological ROS ? negative psych ROS  ? GI/Hepatic ?GERD  Controlled,Pancreatic ca ?  ?Endo/Other  ?negative endocrine ROS ? Renal/GU ?negative Renal ROS  ?negative genitourinary ?  ?Musculoskeletal ? ?(+) Arthritis ,  ? Abdominal ?  ?Peds ? Hematology ?negative hematology ROS ?(+)   ?Anesthesia Other Findings ? ? Reproductive/Obstetrics ?negative OB ROS ? ?  ? ? ? ? ? ? ? ? ? ? ? ? ? ?  ?  ? ? ? ? ? ? ? ?Anesthesia Physical ?Anesthesia Plan ? ?ASA: 3 ? ?Anesthesia Plan: MAC  ? ?Post-op Pain Management:   ? ?Induction:  ? ?PONV Risk Score and Plan: 2 and Propofol infusion and TIVA ? ?Airway Management Planned: Natural Airway and Simple Face Mask ? ?Additional Equipment: None ? ?Intra-op Plan:  ? ?Post-operative Plan:  ? ?Informed Consent: I have reviewed the patients History and Physical, chart, labs and discussed the procedure including the risks, benefits and alternatives for the proposed anesthesia with the patient or authorized representative who has indicated his/her understanding and acceptance.  ? ? ? ?Dental advisory given ? ?Plan Discussed with: CRNA ? ?Anesthesia Plan Comments:   ? ? ? ? ? ?Anesthesia Quick Evaluation ? ?

## 2022-01-17 ENCOUNTER — Ambulatory Visit (HOSPITAL_COMMUNITY): Payer: Medicare HMO | Admitting: Anesthesiology

## 2022-01-17 ENCOUNTER — Telehealth: Payer: Self-pay | Admitting: Gastroenterology

## 2022-01-17 ENCOUNTER — Ambulatory Visit (HOSPITAL_COMMUNITY)
Admission: RE | Admit: 2022-01-17 | Discharge: 2022-01-17 | Disposition: A | Discharge status: Home / Self Care | Payer: Medicare HMO | Source: Ambulatory Visit | Attending: Gastroenterology | Admitting: Gastroenterology

## 2022-01-17 ENCOUNTER — Encounter (HOSPITAL_COMMUNITY)
Admission: RE | Disposition: A | Discharge status: Home / Self Care | Payer: Self-pay | Source: Ambulatory Visit | Attending: Gastroenterology

## 2022-01-17 ENCOUNTER — Encounter (HOSPITAL_COMMUNITY): Payer: Self-pay | Admitting: Gastroenterology

## 2022-01-17 ENCOUNTER — Ambulatory Visit (HOSPITAL_BASED_OUTPATIENT_CLINIC_OR_DEPARTMENT_OTHER): Payer: Medicare HMO | Admitting: Anesthesiology

## 2022-01-17 ENCOUNTER — Other Ambulatory Visit: Payer: Self-pay

## 2022-01-17 DIAGNOSIS — M1990 Unspecified osteoarthritis, unspecified site: Secondary | ICD-10-CM | POA: Diagnosis not present

## 2022-01-17 DIAGNOSIS — X58XXXD Exposure to other specified factors, subsequent encounter: Secondary | ICD-10-CM | POA: Diagnosis not present

## 2022-01-17 DIAGNOSIS — I10 Essential (primary) hypertension: Secondary | ICD-10-CM | POA: Insufficient documentation

## 2022-01-17 DIAGNOSIS — Z79899 Other long term (current) drug therapy: Secondary | ICD-10-CM | POA: Diagnosis not present

## 2022-01-17 DIAGNOSIS — K2100 Gastro-esophageal reflux disease with esophagitis, without bleeding: Secondary | ICD-10-CM

## 2022-01-17 DIAGNOSIS — K2289 Other specified disease of esophagus: Secondary | ICD-10-CM

## 2022-01-17 DIAGNOSIS — Z8507 Personal history of malignant neoplasm of pancreas: Secondary | ICD-10-CM | POA: Diagnosis not present

## 2022-01-17 DIAGNOSIS — B3781 Candidal esophagitis: Secondary | ICD-10-CM | POA: Diagnosis not present

## 2022-01-17 DIAGNOSIS — S72001D Fracture of unspecified part of neck of right femur, subsequent encounter for closed fracture with routine healing: Secondary | ICD-10-CM | POA: Diagnosis not present

## 2022-01-17 DIAGNOSIS — K208 Other esophagitis without bleeding: Secondary | ICD-10-CM | POA: Diagnosis not present

## 2022-01-17 DIAGNOSIS — K209 Esophagitis, unspecified without bleeding: Secondary | ICD-10-CM

## 2022-01-17 DIAGNOSIS — R1319 Other dysphagia: Secondary | ICD-10-CM

## 2022-01-17 DIAGNOSIS — C259 Malignant neoplasm of pancreas, unspecified: Secondary | ICD-10-CM | POA: Diagnosis not present

## 2022-01-17 DIAGNOSIS — R1310 Dysphagia, unspecified: Secondary | ICD-10-CM | POA: Diagnosis not present

## 2022-01-17 HISTORY — PX: BIOPSY: SHX5522

## 2022-01-17 HISTORY — PX: ESOPHAGOGASTRODUODENOSCOPY (EGD) WITH PROPOFOL: SHX5813

## 2022-01-17 SURGERY — ESOPHAGOGASTRODUODENOSCOPY (EGD) WITH PROPOFOL
Anesthesia: Monitor Anesthesia Care

## 2022-01-17 MED ORDER — FLUCONAZOLE 100 MG PO TABS: 100.0000 mg | ORAL_TABLET | Freq: Every day | ORAL | 0 refills | Status: AC

## 2022-01-17 MED ORDER — LACTATED RINGERS IV SOLN
INTRAVENOUS | Status: AC | PRN
  Administered 2022-01-17: 10 mL/h via INTRAVENOUS

## 2022-01-17 MED ORDER — PROPOFOL 500 MG/50ML IV EMUL
INTRAVENOUS | Status: DC | PRN
Start: 2022-01-17 — End: 2022-01-17
  Administered 2022-01-17: 140 ug/kg/min via INTRAVENOUS

## 2022-01-17 MED ORDER — LIDOCAINE 2% (20 MG/ML) 5 ML SYRINGE
INTRAMUSCULAR | Status: DC | PRN
  Administered 2022-01-17: 100 mg via INTRAVENOUS

## 2022-01-17 SURGICAL SUPPLY — 15 items

## 2022-01-17 NOTE — Telephone Encounter (Signed)
Patient called has a procedure schedule for this morning at the hospital said he woke up with a sore throat not sure if that would be ok to proceed with the procedure today please advise. ?

## 2022-01-17 NOTE — Anesthesia Postprocedure Evaluation (Signed)
Anesthesia Post Note ? ?Patient: [name] [name] ? ?Procedure(s) Performed: ESOPHAGOGASTRODUODENOSCOPY (EGD) WITH PROPOFOL ?BIOPSY ? ?  ? ?Patient location during evaluation: PACU ?Anesthesia Type: MAC ?Level of consciousness: awake and alert ?Pain management: pain level controlled ?Vital Signs Assessment: post-procedure vital signs reviewed and stable ?Respiratory status: spontaneous breathing, nonlabored ventilation and respiratory function stable ?Cardiovascular status: blood pressure returned to baseline and stable ?Postop Assessment: no apparent nausea or vomiting ?Anesthetic complications: no ? ? ?No notable events documented. ? ?Last Vitals:  ?Vitals:  ? 01/17/22 1130 01/17/22 1140  ?BP: (!) 147/85 (!) 174/93  ?Pulse: 79 85  ?Resp: 20 16  ?Temp:    ?SpO2: 96% 97%  ?  ?Last Pain:  ?Vitals:  ? 01/17/22 1140  ?TempSrc:   ?PainSc: 0-No pain  ? ? ?  ?  ?  ?  ?  ?  ? ?Jarome Matin Madelon Welsch ? ? ? ? ?

## 2022-01-17 NOTE — Op Note (Signed)
[facility] ?Patient Name: [name] [name] ?Procedure Date: 01/17/2022 ?MRN: [id number] ?Attending MD: Gerrit Heck , MD ?Date of Birth: 1941/04/09 ?CSN: [id number] ?Age: 81 ?Admit Type: Outpatient ?Procedure:                Upper GI endoscopy ?Indications:              Follow-up of reflux esophagitis ?                          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 Normal  ?                          stomach/duodenum. Treated with Protonix, Carafate,  ?                          and viscous Lidocaine with resolution of symptoms.  ?                          He presents today to evaluate for mucosal healing. ?Providers:                Gerrit Heck, MD ?Referring MD:              ?Medicines:                Monitored Anesthesia Care ?Complications:            No immediate complications. ?Estimated Blood Loss:     Estimated blood loss was minimal. ?Procedure:                Pre-Anesthesia Assessment: ?                          - Prior to the procedure, a History and Physical  ?                          was performed, and patient medications and  ?                          allergies were reviewed. [name] tolerance of  ?                          previous anesthesia was also reviewed. The risks  ?                          and benefits of the procedure and the sedation  ?                          options and risks were discussed with the patient.  ?                          All questions were answered, and informed consent  ?                          was obtained.  Prior Anticoagulants: The patient has  ?                          taken no previous anticoagulant or antiplatelet  ?                          agents. ASA Grade Assessment: III - A patient with  ?                          severe systemic disease. After reviewing the risks  ?                          and benefits, the patient was deemed in  ?                           satisfactory condition to undergo the procedure. ?                          After obtaining informed consent, the endoscope was  ?                          passed under direct vision. Throughout the  ?                          procedure, the patient's blood pressure, pulse, and  ?                          oxygen saturations were monitored continuously. The  ?                          [id number]-[id number] ([id number]) Olympus endoscope was introduced  ?                          through the mouth, and advanced to the second part  ?                          of duodenum. The upper GI endoscopy was  ?                          accomplished without difficulty. The patient  ?                          tolerated the procedure well. ?Scope In: ?Scope Out: ?Findings: ?     Localized, white plaques were found in the upper third of the esophagus.  ?     Biopsies were taken with a cold forceps for histology. Estimated blood  ?     loss was minimal. ?     The middle third of the esophagus, lower third of the esophagus and  ?     gastroesophageal junction were normal. The previously noted severe  ?     erosive esophagitis has since healed well. ?     The entire examined stomach was normal. ?     The examined duodenum was normal. 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 Biopsied. ?                          - Normal middle third of esophagus, lower third of  ?                          esophagus and gastroesophageal junction. ?                          - Normal stomach. ?                          - Normal examined duodenum. ?Moderate Sedation: ?     Not Applicable - Patient had care per Anesthesia. ?Recommendation:           - Patient has a contact number available for  ?                          emergencies. The signs and symptoms of potential  ?                          delayed complications were discussed with the  ?                          patient. Return to normal  activities tomorrow.  ?                          Written discharge instructions were provided to the  ?                          patient. ?                          - Resume previous diet. ?                          - Continue present medications. ?                          - Await pathology results. ?                          - Diflucan (fluconazole) 200 mg x1 then 100 mg PO  ?                          daily for 3 weeks. ?                          - Follow-up in the GI clinic as needed. ?Procedure Code(s):        --- Professional --- ?                          [phone], Esophagogastroduodenoscopy, flexible,  ?  transoral; with biopsy, single or multiple ?Diagnosis Code(s):        --- Professional --- ?                          K22.9, Disease of esophagus, unspecified ?                          K21.00, Gastro-esophageal reflux disease with  ?                          esophagitis, without bleeding ?CPT copyright 2019 American Medical Association. All rights reserved. ?The codes documented in this report are preliminary and upon coder review may  ?be revised to meet current compliance requirements. ?Gerrit Heck, MD ?01/17/2022 11:14:31 AM ?Number of Addenda: 0 ?

## 2022-01-17 NOTE — Interval H&P Note (Signed)
History and Physical Interval Note: ? ?01/17/2022 ?10:17 AM ? ?[name] [name]  has presented today for surgery, with the diagnosis of gerd odynophagia.  The various methods of treatment have been discussed with the patient and family. After consideration of risks, benefits and other options for treatment, the patient has consented to  Procedure(s): ?ESOPHAGOGASTRODUODENOSCOPY (EGD) WITH PROPOFOL (N/A) as a surgical intervention.  [name] history has been reviewed, patient examined, no change in status, stable for surgery.  I have reviewed the patient's chart and labs.  Questions were answered to the patient's satisfaction.   ? ? ?[name] V [name] ? ? ?

## 2022-01-17 NOTE — Transfer of Care (Signed)
Immediate Anesthesia Transfer of Care Note ? ?Patient: COALTON ARCH ? ?Procedure(s) Performed: ESOPHAGOGASTRODUODENOSCOPY (EGD) WITH PROPOFOL ?BIOPSY ? ?Patient Location: Endoscopy Unit ? ?Anesthesia Type:MAC ? ?Level of Consciousness: awake ? ?Airway & Oxygen Therapy: Patient Spontanous Breathing and Patient connected to face mask oxygen ? ?Post-op Assessment: Report given to RN and Post -op Vital signs reviewed and stable ? ?Post vital signs: Reviewed and stable ? ?Last Vitals:  ?Vitals Value Taken Time  ?BP 142/88 01/17/22 1111  ?Temp    ?Pulse 91 01/17/22 1113  ?Resp 22 01/17/22 1113  ?SpO2 100 % 01/17/22 1113  ?Vitals shown include unvalidated device data. ? ?Last Pain:  ?Vitals:  ? 01/17/22 1041  ?TempSrc: Temporal  ?PainSc: 0-No pain  ?   ? ?  ? ?Complications: No notable events documented. ?

## 2022-01-17 NOTE — Telephone Encounter (Signed)
Spoke with pt and he states last night he had a tickle in his throat that would not go away. This morning when he got up and moving around his throat is better so he will keep the appt as scheduled. He knows to check in with admitting at the main entrance at [facility]. ?

## 2022-01-17 NOTE — Discharge Instructions (Signed)
YOU HAD AN ENDOSCOPIC PROCEDURE TODAY: Refer to the procedure report and other information in the discharge instructions given to you for any specific questions about what was found during the examination. If this information does not answer your questions, please call [place] office at [phone] to clarify.   YOU SHOULD EXPECT: Some feelings of bloating in the abdomen. Passage of more gas than usual. Walking can help get rid of the air that was put into your GI tract during the procedure and reduce the bloating. If you had a lower endoscopy (such as a colonoscopy or flexible sigmoidoscopy) you may notice spotting of blood in your stool or on the toilet paper. Some abdominal soreness may be present for a day or two, also.  DIET: Your first meal following the procedure should be a light meal and then it is ok to progress to your normal diet. A half-sandwich or bowl of soup is an example of a good first meal. Heavy or fried foods are harder to digest and may make you feel nauseous or bloated. Drink plenty of fluids but you should avoid alcoholic beverages for 24 hours. If you had a esophageal dilation, please see attached instructions for diet.    ACTIVITY: Your care partner should take you home directly after the procedure. You should plan to take it easy, moving slowly for the rest of the day. You can resume normal activity the day after the procedure however YOU SHOULD NOT DRIVE, use power tools, machinery or perform tasks that involve climbing or major physical exertion for 24 hours (because of the sedation medicines used during the test).   SYMPTOMS TO REPORT IMMEDIATELY: A gastroenterologist can be reached at any hour. Please call [phone]  for any of the following symptoms:   Following upper endoscopy (EGD, EUS, ERCP, esophageal dilation) Vomiting of blood or coffee ground material  New, significant abdominal pain  New, significant chest pain or pain under the shoulder blades  Painful or  persistently difficult swallowing  New shortness of breath  Black, tarry-looking or red, bloody stools  FOLLOW UP:  If any biopsies were taken you will be contacted by phone or by letter within the next 1-3 weeks. Call [phone]  if you have not heard about the biopsies in 3 weeks.  Please also call with any specific questions about appointments or follow up tests.  

## 2022-01-18 ENCOUNTER — Encounter (HOSPITAL_COMMUNITY): Payer: Self-pay | Admitting: Gastroenterology

## 2022-01-18 LAB — SURGICAL PATHOLOGY

## 2022-01-19 DIAGNOSIS — M79604 Pain in right leg: Secondary | ICD-10-CM | POA: Diagnosis not present

## 2022-01-25 DIAGNOSIS — E871 Hypo-osmolality and hyponatremia: Secondary | ICD-10-CM | POA: Diagnosis not present

## 2022-01-25 DIAGNOSIS — Z79891 Long term (current) use of opiate analgesic: Secondary | ICD-10-CM | POA: Diagnosis not present

## 2022-01-25 DIAGNOSIS — S72141D Displaced intertrochanteric fracture of right femur, subsequent encounter for closed fracture with routine healing: Secondary | ICD-10-CM | POA: Diagnosis not present

## 2022-01-25 DIAGNOSIS — Z9181 History of falling: Secondary | ICD-10-CM | POA: Diagnosis not present

## 2022-01-25 DIAGNOSIS — M79604 Pain in right leg: Secondary | ICD-10-CM | POA: Diagnosis not present

## 2022-01-25 DIAGNOSIS — M9701XD Periprosthetic fracture around internal prosthetic right hip joint, subsequent encounter: Secondary | ICD-10-CM | POA: Diagnosis not present

## 2022-01-25 DIAGNOSIS — W19XXXD Unspecified fall, subsequent encounter: Secondary | ICD-10-CM | POA: Diagnosis not present

## 2022-01-25 DIAGNOSIS — C8251 Diffuse follicle center lymphoma, lymph nodes of head, face, and neck: Secondary | ICD-10-CM | POA: Diagnosis not present

## 2022-01-25 DIAGNOSIS — K5900 Constipation, unspecified: Secondary | ICD-10-CM | POA: Diagnosis not present

## 2022-01-25 DIAGNOSIS — Z7409 Other reduced mobility: Secondary | ICD-10-CM | POA: Diagnosis not present

## 2022-01-25 DIAGNOSIS — D62 Acute posthemorrhagic anemia: Secondary | ICD-10-CM | POA: Diagnosis not present

## 2022-01-25 DIAGNOSIS — C8590 Non-Hodgkin lymphoma, unspecified, unspecified site: Secondary | ICD-10-CM | POA: Diagnosis not present

## 2022-01-25 DIAGNOSIS — K219 Gastro-esophageal reflux disease without esophagitis: Secondary | ICD-10-CM | POA: Diagnosis not present

## 2022-01-25 DIAGNOSIS — I10 Essential (primary) hypertension: Secondary | ICD-10-CM | POA: Diagnosis not present

## 2022-01-25 DIAGNOSIS — Z741 Need for assistance with personal care: Secondary | ICD-10-CM | POA: Diagnosis not present

## 2022-02-01 ENCOUNTER — Inpatient Hospital Stay: Payer: Medicare HMO | Attending: Internal Medicine

## 2022-02-01 ENCOUNTER — Other Ambulatory Visit: Payer: Self-pay

## 2022-02-01 DIAGNOSIS — C8251 Diffuse follicle center lymphoma, lymph nodes of head, face, and neck: Secondary | ICD-10-CM

## 2022-02-01 DIAGNOSIS — Z95828 Presence of other vascular implants and grafts: Secondary | ICD-10-CM

## 2022-02-01 DIAGNOSIS — Z452 Encounter for adjustment and management of vascular access device: Secondary | ICD-10-CM | POA: Diagnosis not present

## 2022-02-01 DIAGNOSIS — Z8572 Personal history of non-Hodgkin lymphomas: Secondary | ICD-10-CM | POA: Insufficient documentation

## 2022-02-01 MED ORDER — SODIUM CHLORIDE 0.9% FLUSH
10.0000 mL | Freq: Once | INTRAVENOUS | Status: AC
  Administered 2022-02-01: 10 mL

## 2022-02-01 MED ORDER — HEPARIN SOD (PORK) LOCK FLUSH 100 UNIT/ML IV SOLN
500.0000 [IU] | Freq: Once | INTRAVENOUS | Status: AC
  Administered 2022-02-01: 500 [IU]

## 2022-02-02 ENCOUNTER — Other Ambulatory Visit: Payer: Self-pay | Admitting: *Deleted

## 2022-02-02 NOTE — Patient Outreach (Signed)
[facility]) Care Management Telephonic RN Care Manager Note   02/02/2022 Name:  [name] [name] MRN:  [id number] DOB:  04-Feb-1941  Summary: Lahoma Crocker call placed to member, successful.  Denies any urgent concerns, encouraged to contact this care manager with questions.    Subjective: [name] [name] is an 81 y.o. year old male who is a primary patient of [name], MD. The care management team was consulted for assistance with care management and/or care coordination needs.    Telephonic RN Care Manager completed Telephone Visit today.  Objective:   Medications Reviewed Today     Reviewed by Lavena Bullion, DO (Physician) on 01/17/22 at 1051  Med List Status: Complete   Medication Order Taking? Sig Documenting Provider Last Dose Status Informant  Aflibercept 2 MG/0.05ML SOLN [id number] No Inject 0.5 mLs into the eye as directed. Take every 12 wks in eye doctor office. [provider] More than a month Active Self           Med Note Gershon Cull, Joanell Rising   Fri Oct 28, 2021  3:20 AM) Gets done at eye doctor   alum & mag hydroxide-simeth (MAALOX/MYLANTA) 200-200-20 MG/5ML suspension [id number] Yes Take 15 mLs by mouth every 6 (six) hours as needed for indigestion or heartburn. Bonnielee Haff, MD Past Week Active Self  Camillo Flaming MEDICATION [id number] Yes Take 3 tablets by mouth 2 (two) times daily. Medication Name: Vollara supplement (multivitamin) [provider] 01/16/2022 Active Self  amLODipine (NORVASC) 5 MG tablet [id number] Yes Take 5 mg by mouth daily. [provider] 01/16/2022 Active Self  Ascorbic Acid (VITAMIN C) 1000 MG tablet [id number] Yes Take 4,000 mg by mouth daily. Pt states he takes 4,'000mg'$  [provider] 01/16/2022 Active Self           Med Note Gloriann Loan, DIANE H   Thu Feb 12, 2020 10:59 AM) "4000 mg /day -powder"  bisacodyl (DULCOLAX) 10 MG suppository [id number] Yes Place 1 suppository (10 mg total) rectally  daily as needed for moderate constipation. Bonnielee Haff, MD Past Week Active Self  Cholecalciferol (VITAMIN D) 125 MCG (5000 UT) CAPS [id number] Yes Take 5,000-10,000 Units by mouth daily. [provider] 01/16/2022 Active Self           Med Note Wilmon Pali, MELISSA R   Tue Jan 10, 2022  2:46 PM)    feeding supplement (ENSURE ENLIVE / ENSURE PLUS) LIQD [id number] No Take 237 mLs by mouth 3 (three) times daily between meals.  Patient not taking: Reported on 12/29/2021   Bonnielee Haff, MD Unknown Active Self  lisinopril (ZESTRIL) 20 MG tablet [id number] Yes Take 20 mg by mouth daily. [provider] 01/16/2022 Active Self  LUTEIN PO [id number] Yes Take 1 tablet by mouth daily. Eye vitamin [provider] 01/16/2022 Active Self  MAGNESIUM OXIDE PO [id number] Yes Take 1,000 mg by mouth daily. 500 mg per tab [provider] 01/16/2022 Active Self           Med Note Wilmon Pali, MELISSA R   Tue Jan 10, 2022  2:46 PM)    Misc Natural Products (PROSTATE HEALTH PO) [id number] Yes Take 1 capsule by mouth daily. Prostagenix [provider] 01/16/2022 Active Self  Nutritional Supplements (,FEEDING SUPPLEMENT, PROSOURCE PLUS) liquid [id number] No Take 30 mLs by mouth 2 (two) times daily between meals.  Patient not taking: Reported on 12/05/2021   Bonnielee Haff, MD Unknown Active Self  senna-docusate (SENOKOT-S) 8.6-50 MG tablet [id number] No Take 2  tablets by mouth 2 (two) times daily.  Patient not taking: Reported on 12/29/2021   Bonnielee Haff, MD Unknown Active Self  Turmeric (QC TUMERIC COMPLEX PO) [id number] Yes Take 1 tablet by mouth daily in the afternoon. [provider] 01/16/2022 Active Self  Vitamin D-Vitamin K (VITAMIN K2-VITAMIN D3 PO) [id number] Yes Take 1 tablet by mouth daily. 100 mcg / 1000 units [provider] 01/16/2022 Active Self  Zinc 50 MG TABS [id number] Yes Take 50 mg by mouth daily in the afternoon. [provider] 01/16/2022 Active Self              SDOH:  (Social Determinants of Health) assessments and interventions performed:     Care Plan  Review of patient past medical history, allergies, medications, health status, including review of consultants reports, laboratory and other test data, was performed as part of comprehensive evaluation for care management services.   Care Plan : [facility] Plan of Care (Adult)  Updates made by Valente David, RN since 02/02/2022 12:00 AM     Problem: Need for chronic care management due to recent fall resulting in hip fracture requiring ORIF   Priority: High     Long-Range Goal: Member will not have any falls or be readmitted to hospital in the next 3 months   Start Date: 12/05/2021  Expected End Date: 03/05/2022  This Visit's Progress: On track  Recent Progress: On track  Priority: High  Note:   Current Barriers:  Knowledge Deficits related to plan of care for management of fall/hip fracture  Care Coordination needs related to Limited access to food Transportation barriers  RNCM Clinical Goal(s):  Patient will verbalize understanding of plan for management of fall/hip fracture as evidenced by no recurrent falls take all medications exactly as prescribed and will call provider for medication related questions as evidenced by member reported compliance attend all scheduled medical appointments: Ortho, GI, and PCP as evidenced by Member reported attendance and EMR records work with community resource care guide to address needs related to  Limited access to food as evidenced by patient and/or community resource care guide support work with home health PT/OT to increase strength and independence as evidenced by ability to care for self in the home  through collaboration with Consulting civil engineer, provider, and care team.   Interventions: Inter-disciplinary care team collaboration (see longitudinal plan of care) Evaluation of current treatment plan related to  self management and  patient's adherence to plan as established by provider   Falls Interventions:  (Status:  Goal on track:  Yes.) Long Term Goal Provided written and verbal education re: potential causes of falls and Fall prevention strategies Advised patient of importance of notifying provider of falls Assessed for falls since last encounter    Surgery (ORIF):  (Status: Goal on track:  Yes.) Long Term Goal Evaluation of current treatment plan related to ORIF surgery assessed patient/caregiver understanding of surgical procedure   addressed questions about post - surgical incision care  reviewed medications with patient and addressed questions performed PHQ2-PHQ9 assessment     Patient Goals/Self-Care Activities: Take all medications as prescribed Attend all scheduled provider appointments Perform all self care activities independently   Follow Up Plan:  The patient has been provided with contact information for the care management team and has been advised to call with any health related questions or concerns.    Update 3/27 - Member report he is doing well since discharge.  Continues to improve on pain control, state he's  only taking medication now in the evening.  He has had some constipation, last bowel movement 6 days ago.  He has taken MOM as well as a laxative for relief.  Advised to consider stool softeners such as Dulcolax or sennokot, agrees.  Will see his PCP on Wednesday, transportation already secure.  He is still in need of a long term plan for transportation and food resources, reminded to reach out to care guide, contact information for [place] provided.  Home health nurse and PT remains active, no signs of infection, no falls since last outreach.   Update 4/12 - Member state he is doing much better.  Had follow up with PCP last week, report that visit went well.  He was seen by ortho specialist yesterday, report healing well from fracture and surgery.  He is still having to take Miralax  twice a day to keep his bowels regular, last BM was yesterday.  He denies having any further issues with reflux however will follow up with GI tomorrow.  Advised that he has EGD scheduled for 5/2, state he is hoping he will not need it but will know more information after tomorrow.  He still has friend that is living with him and helping with needs (such as transportation).     Update 4/20 - Doing well with no falls but state he still feels he is constipated at times.  He is taking Miralax as instructed, eating prunes, and taking MOM.  He is not having a bowel movement daily as he feels he should. Denies any abdominal discomfort or distention.  Advised some patients do not have daily movements but if he is still concerned, contact his PCP to inquire about alternatives.  He agrees.  Will continue to work with home health to further regain strength.   Update 5/18 - Member state he has completed home health PT and has not started outpatient PT services.  Has friend that will be providing transportation as well as continuing to help with tasks in the home (cooking, grocery shopping, etc).  He will follow up with PCP and ortho provider in June.  No complaints of constipation today.          Plan:  Telephone follow up appointment with care management team member scheduled for:  1 month The patient has been provided with contact information for the care management team and has been advised to call with any health related questions or concerns.   Valente David, RN, MSN, [place] Manager [phone]

## 2022-02-07 DIAGNOSIS — S72141D Displaced intertrochanteric fracture of right femur, subsequent encounter for closed fracture with routine healing: Secondary | ICD-10-CM | POA: Diagnosis not present

## 2022-02-20 DIAGNOSIS — M79604 Pain in right leg: Secondary | ICD-10-CM | POA: Diagnosis not present

## 2022-02-27 DIAGNOSIS — M79604 Pain in right leg: Secondary | ICD-10-CM | POA: Diagnosis not present

## 2022-03-02 ENCOUNTER — Other Ambulatory Visit: Payer: Self-pay | Admitting: *Deleted

## 2022-03-02 NOTE — Patient Outreach (Signed)
[facility]) Care Management Telephonic RN Care Manager Note   03/02/2022 Name:  [name] [name] MRN:  [id number] DOB:  November 20, 1940  Summary: Lahoma Crocker call placed to member, successful.  Denies any urgent concerns, encouraged to contact this care manager with questions.    Subjective: [name] [name] is an 81 y.o. year old male who is a primary patient of [name], MD. The care management team was consulted for assistance with care management and/or care coordination needs.    Telephonic RN Care Manager completed Telephone Visit today.  Objective:   Medications Reviewed Today     Reviewed by Lavena Bullion, DO (Physician) on 01/17/22 at 1051  Med List Status: Complete   Medication Order Taking? Sig Documenting Provider Last Dose Status Informant  Aflibercept 2 MG/0.05ML SOLN [id number] No Inject 0.5 mLs into the eye as directed. Take every 12 wks in eye doctor office. [provider] More than a month Active Self           Med Note Gershon Cull, Joanell Rising   Fri Oct 28, 2021  3:20 AM) Gets done at eye doctor   alum & mag hydroxide-simeth (MAALOX/MYLANTA) 200-200-20 MG/5ML suspension [id number] Yes Take 15 mLs by mouth every 6 (six) hours as needed for indigestion or heartburn. Bonnielee Haff, MD Past Week Active Self  Camillo Flaming MEDICATION [id number] Yes Take 3 tablets by mouth 2 (two) times daily. Medication Name: Vollara supplement (multivitamin) [provider] 01/16/2022 Active Self  amLODipine (NORVASC) 5 MG tablet [id number] Yes Take 5 mg by mouth daily. [provider] 01/16/2022 Active Self  Ascorbic Acid (VITAMIN C) 1000 MG tablet [id number] Yes Take 4,000 mg by mouth daily. Pt states he takes 4,037m [provider] 01/16/2022 Active Self           Med Note (Gloriann Loan DIANE H   Thu Feb 12, 2020 10:59 AM) "4000 mg /day -powder"  bisacodyl (DULCOLAX) 10 MG suppository [id number] Place 1 suppository (10 mg total) rectally  daily as needed for moderate constipation. KBonnielee Haff MD Past Week Active Self  Cholecalciferol (VITAMIN D) 125 MCG (5000 UT) CAPS [id number] Take 5,000-10,000 Units by mouth daily. [provider] 01/16/2022 Active Self           Med Note (Wilmon Pali MELISSA R   Tue Jan 10, 2022  2:46 PM)    feeding supplement (ENSURE ENLIVE / ENSURE PLUS) LIQD [id number] Take 237 mLs by mouth 3 (three) times daily between meals.  Patient not taking: Reported on 12/29/2021   KBonnielee Haff MD Unknown Active Self  lisinopril (ZESTRIL) 20 MG tablet [id number] Take 20 mg by mouth daily. [provider] 01/16/2022 Active Self  LUTEIN PO [id number] Take 1 tablet by mouth daily. Eye vitamin [provider] 01/16/2022 Active Self  MAGNESIUM OXIDE PO [id number] Take 1,000 mg by mouth daily. 500 mg per tab [provider] 01/16/2022 Active Self           Med Note (Wilmon Pali MELISSA R   Tue Jan 10, 2022  2:46 PM)    Misc Natural Products (PROSTATE HEALTH PO) [id number] Take 1 capsule by mouth daily. Prostagenix [provider] 01/16/2022 Active Self  Nutritional Supplements (,FEEDING SUPPLEMENT, PROSOURCE PLUS) liquid [id number] Take 30 mLs by mouth 2 (two) times daily between meals.  Patient not taking: Reported on 12/05/2021   KBonnielee Haff MD Unknown Active Self  senna-docusate (SENOKOT-S) 8.6-50 MG tablet [id number] Take 2  tablets by mouth 2 (two) times daily.  Patient not taking: Reported on 12/29/2021   Bonnielee Haff, MD Unknown Active Self  Turmeric (QC TUMERIC COMPLEX PO) [id number] Yes Take 1 tablet by mouth daily in the afternoon. [provider] 01/16/2022 Active Self  Vitamin D-Vitamin K (VITAMIN K2-VITAMIN D3 PO) [id number] Yes Take 1 tablet by mouth daily. 100 mcg / 1000 units [provider] 01/16/2022 Active Self  Zinc 50 MG TABS [id number] Yes Take 50 mg by mouth daily in the afternoon. [provider] 01/16/2022 Active Self              SDOH:  (Social Determinants of Health) assessments and interventions performed:     Care Plan  Review of patient past medical history, allergies, medications, health status, including review of consultants reports, laboratory and other test data, was performed as part of comprehensive evaluation for care management services.   Care Plan : [facility] Plan of Care (Adult)  Updates made by Valente David, RN since 03/02/2022 12:00 AM     Problem: Need for chronic care management due to recent fall resulting in hip fracture requiring ORIF   Priority: High     Long-Range Goal: Member will not have any falls or be readmitted to hospital in the next 3 months Completed 03/02/2022  Start Date: 12/05/2021  Expected End Date: 03/05/2022  This Visit's Progress: On track  Recent Progress: On track  Priority: High  Note:   Current Barriers:  Knowledge Deficits related to plan of care for management of fall/hip fracture  Care Coordination needs related to Limited access to food Transportation barriers  RNCM Clinical Goal(s):  Patient will verbalize understanding of plan for management of fall/hip fracture as evidenced by no recurrent falls take all medications exactly as prescribed and will call provider for medication related questions as evidenced by member reported compliance attend all scheduled medical appointments: Ortho, GI, and PCP as evidenced by Member reported attendance and EMR records work with community resource care guide to address needs related to  Limited access to food as evidenced by patient and/or community resource care guide support work with home health PT/OT to increase strength and independence as evidenced by ability to care for self in the home  through collaboration with Consulting civil engineer, provider, and care team.   Interventions: Inter-disciplinary care team collaboration (see longitudinal plan of care) Evaluation of current treatment plan related to  self  management and patient's adherence to plan as established by provider   Falls Interventions:  (Status:  Goal Met.) Long Term Goal Provided written and verbal education re: potential causes of falls and Fall prevention strategies Advised patient of importance of notifying provider of falls Assessed for falls since last encounter    Surgery (ORIF):  (Status: Goal Met.) Long Term Goal Evaluation of current treatment plan related to ORIF surgery assessed patient/caregiver understanding of surgical procedure   addressed questions about post - surgical incision care  reviewed medications with patient and addressed questions performed PHQ2-PHQ9 assessment     Patient Goals/Self-Care Activities: Take all medications as prescribed Attend all scheduled provider appointments Perform all self care activities independently   Follow Up Plan:  The patient has been provided with contact information for the care management team and has been advised to call with any health related questions or concerns.    Update 3/27 - Member report he is doing well since discharge.  Continues to improve on pain control, state he's only taking medication now in  the evening.  He has had some constipation, last bowel movement 6 days ago.  He has taken MOM as well as a laxative for relief.  Advised to consider stool softeners such as Dulcolax or sennokot, agrees.  Will see his PCP on Wednesday, transportation already secure.  He is still in need of a long term plan for transportation and food resources, reminded to reach out to care guide, contact information for [place] provided.  Home health nurse and PT remains active, no signs of infection, no falls since last outreach.   Update 4/12 - Member state he is doing much better.  Had follow up with PCP last week, report that visit went well.  He was seen by ortho specialist yesterday, report healing well from fracture and surgery.  He is still having to take Miralax twice a  day to keep his bowels regular, last BM was yesterday.  He denies having any further issues with reflux however will follow up with GI tomorrow.  Advised that he has EGD scheduled for 5/2, state he is hoping he will not need it but will know more information after tomorrow.  He still has friend that is living with him and helping with needs (such as transportation).     Update 4/20 - Doing well with no falls but state he still feels he is constipated at times.  He is taking Miralax as instructed, eating prunes, and taking MOM.  He is not having a bowel movement daily as he feels he should. Denies any abdominal discomfort or distention.  Advised some patients do not have daily movements but if he is still concerned, contact his PCP to inquire about alternatives.  He agrees.  Will continue to work with home health to further regain strength.   Update 5/18 - Member state he has completed home health PT and has not started outpatient PT services.  Has friend that will be providing transportation as well as continuing to help with tasks in the home (cooking, grocery shopping, etc).  He will follow up with PCP and ortho provider in June.  No complaints of constipation today.     Update 6/15 - Now active with outpatient PT services, state he is getting stronger slower than he hoped but making progress.  Denies ongoing issues, goals met, no further falls.  Continues to have support of friends and family.       Plan:  No further follow up required: goals met, case closed  Valente David, RN, MSN, [place] Manager [phone]

## 2022-03-07 DIAGNOSIS — M79604 Pain in right leg: Secondary | ICD-10-CM | POA: Diagnosis not present

## 2022-03-09 DIAGNOSIS — M79604 Pain in right leg: Secondary | ICD-10-CM | POA: Diagnosis not present

## 2022-03-13 DIAGNOSIS — M79604 Pain in right leg: Secondary | ICD-10-CM | POA: Diagnosis not present

## 2022-03-15 ENCOUNTER — Inpatient Hospital Stay: Payer: Medicare HMO | Attending: Internal Medicine

## 2022-03-15 ENCOUNTER — Other Ambulatory Visit: Payer: Self-pay

## 2022-03-15 DIAGNOSIS — Z8572 Personal history of non-Hodgkin lymphomas: Secondary | ICD-10-CM | POA: Diagnosis not present

## 2022-03-15 DIAGNOSIS — C8251 Diffuse follicle center lymphoma, lymph nodes of head, face, and neck: Secondary | ICD-10-CM

## 2022-03-15 DIAGNOSIS — H43391 Other vitreous opacities, right eye: Secondary | ICD-10-CM | POA: Diagnosis not present

## 2022-03-15 DIAGNOSIS — H43813 Vitreous degeneration, bilateral: Secondary | ICD-10-CM | POA: Diagnosis not present

## 2022-03-15 DIAGNOSIS — H35372 Puckering of macula, left eye: Secondary | ICD-10-CM | POA: Diagnosis not present

## 2022-03-15 DIAGNOSIS — H34813 Central retinal vein occlusion, bilateral, with macular edema: Secondary | ICD-10-CM | POA: Diagnosis not present

## 2022-03-15 DIAGNOSIS — Z452 Encounter for adjustment and management of vascular access device: Secondary | ICD-10-CM | POA: Diagnosis not present

## 2022-03-15 DIAGNOSIS — Z95828 Presence of other vascular implants and grafts: Secondary | ICD-10-CM

## 2022-03-15 MED ORDER — SODIUM CHLORIDE 0.9% FLUSH
10.0000 mL | Freq: Once | INTRAVENOUS | Status: AC
  Administered 2022-03-15: 10 mL

## 2022-03-15 MED ORDER — HEPARIN SOD (PORK) LOCK FLUSH 100 UNIT/ML IV SOLN
500.0000 [IU] | Freq: Once | INTRAVENOUS | Status: AC
  Administered 2022-03-15: 500 [IU]

## 2022-03-16 DIAGNOSIS — M79604 Pain in right leg: Secondary | ICD-10-CM | POA: Diagnosis not present

## 2022-03-22 DIAGNOSIS — M79604 Pain in right leg: Secondary | ICD-10-CM | POA: Diagnosis not present

## 2022-03-27 DIAGNOSIS — M79604 Pain in right leg: Secondary | ICD-10-CM | POA: Diagnosis not present

## 2022-03-31 DIAGNOSIS — M79604 Pain in right leg: Secondary | ICD-10-CM | POA: Diagnosis not present

## 2022-04-03 DIAGNOSIS — M79604 Pain in right leg: Secondary | ICD-10-CM | POA: Diagnosis not present

## 2022-04-05 DIAGNOSIS — M79604 Pain in right leg: Secondary | ICD-10-CM | POA: Diagnosis not present

## 2022-04-10 ENCOUNTER — Ambulatory Visit: Payer: Medicare HMO | Admitting: Dermatology

## 2022-04-10 ENCOUNTER — Encounter: Payer: Self-pay | Admitting: Dermatology

## 2022-04-10 DIAGNOSIS — L570 Actinic keratosis: Secondary | ICD-10-CM

## 2022-04-10 DIAGNOSIS — M79604 Pain in right leg: Secondary | ICD-10-CM | POA: Diagnosis not present

## 2022-04-10 DIAGNOSIS — C44319 Basal cell carcinoma of skin of other parts of face: Secondary | ICD-10-CM | POA: Diagnosis not present

## 2022-04-10 DIAGNOSIS — D044 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: Secondary | ICD-10-CM | POA: Diagnosis not present

## 2022-04-10 DIAGNOSIS — D485 Neoplasm of uncertain behavior of skin: Secondary | ICD-10-CM

## 2022-04-10 NOTE — Patient Instructions (Signed)

## 2022-04-11 DIAGNOSIS — S72141D Displaced intertrochanteric fracture of right femur, subsequent encounter for closed fracture with routine healing: Secondary | ICD-10-CM | POA: Diagnosis not present

## 2022-04-12 DIAGNOSIS — M79604 Pain in right leg: Secondary | ICD-10-CM | POA: Diagnosis not present

## 2022-04-17 ENCOUNTER — Other Ambulatory Visit: Payer: Self-pay | Admitting: Student

## 2022-04-17 DIAGNOSIS — S72144A Nondisplaced intertrochanteric fracture of right femur, initial encounter for closed fracture: Secondary | ICD-10-CM

## 2022-04-17 DIAGNOSIS — M79604 Pain in right leg: Secondary | ICD-10-CM | POA: Diagnosis not present

## 2022-04-19 DIAGNOSIS — M79604 Pain in right leg: Secondary | ICD-10-CM | POA: Diagnosis not present

## 2022-04-20 ENCOUNTER — Other Ambulatory Visit: Payer: Medicare HMO

## 2022-04-21 ENCOUNTER — Ambulatory Visit
Admission: RE | Admit: 2022-04-21 | Discharge: 2022-04-21 | Disposition: A | Discharge status: Home / Self Care | Payer: Medicare HMO | Source: Ambulatory Visit | Attending: Student | Admitting: Student

## 2022-04-21 DIAGNOSIS — S72141A Displaced intertrochanteric fracture of right femur, initial encounter for closed fracture: Secondary | ICD-10-CM | POA: Diagnosis not present

## 2022-04-21 DIAGNOSIS — S72144A Nondisplaced intertrochanteric fracture of right femur, initial encounter for closed fracture: Secondary | ICD-10-CM

## 2022-04-24 DIAGNOSIS — M79604 Pain in right leg: Secondary | ICD-10-CM | POA: Diagnosis not present

## 2022-04-26 ENCOUNTER — Inpatient Hospital Stay: Payer: Medicare HMO | Attending: Internal Medicine

## 2022-04-26 ENCOUNTER — Other Ambulatory Visit: Payer: Self-pay

## 2022-04-26 DIAGNOSIS — Z452 Encounter for adjustment and management of vascular access device: Secondary | ICD-10-CM | POA: Insufficient documentation

## 2022-04-26 DIAGNOSIS — Z8572 Personal history of non-Hodgkin lymphomas: Secondary | ICD-10-CM | POA: Diagnosis not present

## 2022-04-26 DIAGNOSIS — Z95828 Presence of other vascular implants and grafts: Secondary | ICD-10-CM

## 2022-04-26 DIAGNOSIS — M79604 Pain in right leg: Secondary | ICD-10-CM | POA: Diagnosis not present

## 2022-04-26 DIAGNOSIS — C8251 Diffuse follicle center lymphoma, lymph nodes of head, face, and neck: Secondary | ICD-10-CM

## 2022-04-26 MED ORDER — SODIUM CHLORIDE 0.9% FLUSH
10.0000 mL | Freq: Once | INTRAVENOUS | Status: AC
  Administered 2022-04-26: 10 mL

## 2022-04-26 MED ORDER — HEPARIN SOD (PORK) LOCK FLUSH 100 UNIT/ML IV SOLN
500.0000 [IU] | Freq: Once | INTRAVENOUS | Status: AC
  Administered 2022-04-26: 500 [IU]

## 2022-04-29 ENCOUNTER — Encounter: Payer: Self-pay | Admitting: Dermatology

## 2022-04-30 NOTE — Progress Notes (Signed)
Follow-Up Visit   Subjective  [name] [name] is a 81 y.o. male who presents for the following: Annual Exam (No new concerns-Itchy scalp, face and ears- tx- none).  Annual examination, concerned with more spots on scalp and face Location:  Duration:  Quality:  Associated Signs/Symptoms: Modifying Factors:  Severity:  Timing: Context:   Objective  Well appearing patient in no apparent distress; mood and affect are within normal limits. Left Buccal Cheek, Left Temple, Mid Parietal Scalp (4), Right Buccal Cheek, Right Forehead Roughly ten 3 to 5 mm gritty and hornlike pink crusts  Left Parotid Area 8 mm waxy pink none eroded lesion       Mid Parietal Scalp 1 Centimeter eroded waxy crust         A focused examination was performed including the head and neck. Relevant physical exam findings are noted in the Assessment and Plan.   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  Skin / nail biopsy Type of biopsy: tangential   Informed consent: discussed and consent obtained   Timeout: patient name, date of birth, surgical site, and procedure verified   Procedure prep:  Patient was prepped and draped in usual sterile fashion (Non sterile) Prep type:  Chlorhexidine Anesthesia: the lesion was anesthetized in a standard fashion   Anesthetic:  1% lidocaine w/ epinephrine 1-100,000 local  infiltration Instrument used: flexible razor blade   Outcome: patient tolerated procedure well   Post-procedure details: wound care instructions given    Destruction of lesion Complexity: simple   Destruction method: electrodesiccation and curettage   Informed consent: discussed and consent obtained   Timeout:  patient name, date of birth, surgical site, and procedure verified Anesthesia: the lesion was anesthetized in a standard fashion   Anesthetic:  1% lidocaine w/ epinephrine 1-100,000 local infiltration Curettage performed in three different directions: Yes   Curettage cycles:  3 Lesion length (cm):  1 Lesion width (cm):  1 Margin per side (cm):  0 Final wound size (cm):  1 Hemostasis achieved with:  aluminum chloride Outcome: patient tolerated procedure well with no complications   Post-procedure details: wound care instructions given    Specimen 2 - Surgical pathology Differential Diagnosis: bcc vs scc- txpbx  Check Margins: No  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  Skin / nail biopsy Type of biopsy: tangential   Informed consent: discussed and consent obtained   Timeout: patient name, date of birth, surgical site, and procedure verified   Procedure prep:  Patient was prepped and draped in usual sterile fashion (Non sterile) Prep type:  Chlorhexidine Anesthesia: the lesion was anesthetized in a standard fashion   Anesthetic:  1% lidocaine w/ epinephrine 1-100,000 local infiltration Instrument used: flexible razor blade   Outcome: patient tolerated procedure well   Post-procedure details: wound care instructions given    Destruction of lesion Complexity: simple   Destruction method: electrodesiccation and  curettage   Informed consent: discussed and consent obtained   Timeout:  patient name, date of birth, surgical site, and procedure verified Anesthesia: the lesion was anesthetized in a standard fashion   Anesthetic:  1% lidocaine w/  epinephrine 1-100,000 local infiltration Curettage performed in three different directions: Yes   Curettage cycles:  3 Lesion length (cm):  1.1 Lesion width (cm):  1.1 Margin per side (cm):  0 Final wound size (cm):  1.1 Hemostasis achieved with:  aluminum chloride Outcome: patient tolerated procedure well with no complications   Post-procedure details: wound care instructions given    Specimen 1 - Surgical pathology Differential Diagnosis: bcc vs scc- txpbx  Check Margins: No  After shave biopsy the base was treated with curettage plus cautery      I, Lavonna Monarch, MD, have reviewed all documentation for this visit.  The documentation on 04/30/22 for the exam, diagnosis, procedures, and orders are all accurate and complete.

## 2022-05-01 DIAGNOSIS — M79604 Pain in right leg: Secondary | ICD-10-CM | POA: Diagnosis not present

## 2022-05-15 DIAGNOSIS — M79604 Pain in right leg: Secondary | ICD-10-CM | POA: Diagnosis not present

## 2022-05-16 DIAGNOSIS — S72141K Displaced intertrochanteric fracture of right femur, subsequent encounter for closed fracture with nonunion: Secondary | ICD-10-CM | POA: Diagnosis not present

## 2022-05-17 DIAGNOSIS — M79604 Pain in right leg: Secondary | ICD-10-CM | POA: Diagnosis not present

## 2022-05-24 DIAGNOSIS — M79604 Pain in right leg: Secondary | ICD-10-CM | POA: Diagnosis not present

## 2022-05-24 DIAGNOSIS — S72141K Displaced intertrochanteric fracture of right femur, subsequent encounter for closed fracture with nonunion: Secondary | ICD-10-CM | POA: Diagnosis not present

## 2022-05-26 DIAGNOSIS — M79604 Pain in right leg: Secondary | ICD-10-CM | POA: Diagnosis not present

## 2022-05-31 DIAGNOSIS — R351 Nocturia: Secondary | ICD-10-CM | POA: Diagnosis not present

## 2022-05-31 DIAGNOSIS — Z125 Encounter for screening for malignant neoplasm of prostate: Secondary | ICD-10-CM | POA: Diagnosis not present

## 2022-05-31 DIAGNOSIS — R3914 Feeling of incomplete bladder emptying: Secondary | ICD-10-CM | POA: Diagnosis not present

## 2022-05-31 DIAGNOSIS — N401 Enlarged prostate with lower urinary tract symptoms: Secondary | ICD-10-CM | POA: Diagnosis not present

## 2022-05-31 DIAGNOSIS — M79604 Pain in right leg: Secondary | ICD-10-CM | POA: Diagnosis not present

## 2022-06-02 DIAGNOSIS — M79604 Pain in right leg: Secondary | ICD-10-CM | POA: Diagnosis not present

## 2022-06-07 ENCOUNTER — Other Ambulatory Visit: Payer: Self-pay

## 2022-06-07 ENCOUNTER — Inpatient Hospital Stay: Payer: Medicare HMO | Attending: Internal Medicine

## 2022-06-07 DIAGNOSIS — C8251 Diffuse follicle center lymphoma, lymph nodes of head, face, and neck: Secondary | ICD-10-CM

## 2022-06-07 DIAGNOSIS — Z8572 Personal history of non-Hodgkin lymphomas: Secondary | ICD-10-CM | POA: Diagnosis not present

## 2022-06-07 DIAGNOSIS — H43391 Other vitreous opacities, right eye: Secondary | ICD-10-CM | POA: Diagnosis not present

## 2022-06-07 DIAGNOSIS — M79604 Pain in right leg: Secondary | ICD-10-CM | POA: Diagnosis not present

## 2022-06-07 DIAGNOSIS — Z452 Encounter for adjustment and management of vascular access device: Secondary | ICD-10-CM | POA: Diagnosis not present

## 2022-06-07 DIAGNOSIS — H3582 Retinal ischemia: Secondary | ICD-10-CM | POA: Diagnosis not present

## 2022-06-07 DIAGNOSIS — Z95828 Presence of other vascular implants and grafts: Secondary | ICD-10-CM

## 2022-06-07 DIAGNOSIS — H43813 Vitreous degeneration, bilateral: Secondary | ICD-10-CM | POA: Diagnosis not present

## 2022-06-07 DIAGNOSIS — H34813 Central retinal vein occlusion, bilateral, with macular edema: Secondary | ICD-10-CM | POA: Diagnosis not present

## 2022-06-07 MED ORDER — HEPARIN SOD (PORK) LOCK FLUSH 100 UNIT/ML IV SOLN
500.0000 [IU] | Freq: Once | INTRAVENOUS | Status: AC
  Administered 2022-06-07: 500 [IU]

## 2022-06-07 MED ORDER — SODIUM CHLORIDE 0.9% FLUSH
10.0000 mL | Freq: Once | INTRAVENOUS | Status: AC
  Administered 2022-06-07: 10 mL

## 2022-06-09 DIAGNOSIS — M79604 Pain in right leg: Secondary | ICD-10-CM | POA: Diagnosis not present

## 2022-06-14 ENCOUNTER — Telehealth: Payer: Self-pay

## 2022-06-14 DIAGNOSIS — M79604 Pain in right leg: Secondary | ICD-10-CM | POA: Diagnosis not present

## 2022-06-14 NOTE — Patient Outreach (Signed)
  Care Coordination   06/14/2022 Name: [name] [name] MRN: [id number] DOB: 1941/03/25   Care Coordination Outreach Attempts:  An unsuccessful telephone outreach was attempted today to offer the patient information about available care coordination services as a benefit of their health plan.   Follow Up Plan:  Additional outreach attempts will be made to offer the patient care coordination information and services.   Encounter Outcome:  No Answer  Care Coordination Interventions Activated:  No   Care Coordination Interventions:  No, not indicated    Jone Baseman, RN, MSN [facility] Care Management Care Management Coordinator Direct Line [phone]

## 2022-06-16 DIAGNOSIS — M79604 Pain in right leg: Secondary | ICD-10-CM | POA: Diagnosis not present

## 2022-06-23 ENCOUNTER — Telehealth: Payer: Self-pay

## 2022-06-23 DIAGNOSIS — J029 Acute pharyngitis, unspecified: Secondary | ICD-10-CM | POA: Diagnosis not present

## 2022-06-23 DIAGNOSIS — R058 Other specified cough: Secondary | ICD-10-CM | POA: Diagnosis not present

## 2022-06-23 DIAGNOSIS — J40 Bronchitis, not specified as acute or chronic: Secondary | ICD-10-CM | POA: Diagnosis not present

## 2022-06-23 NOTE — Patient Outreach (Signed)
  Care Coordination   06/23/2022 Name: [name] [name] MRN: [id number] DOB: 1941-08-19   Care Coordination Outreach Attempts:  A second unsuccessful outreach was attempted today to offer the patient with information about available care coordination services as a benefit of their health plan.     Follow Up Plan:  Additional outreach attempts will be made to offer the patient care coordination information and services.   Encounter Outcome:  No Answer  Care Coordination Interventions Activated:  No   Care Coordination Interventions:  No, not indicated    Jone Baseman, RN, MSN [facility] Care Management Care Management Coordinator Direct Line [phone]

## 2022-06-29 DIAGNOSIS — N401 Enlarged prostate with lower urinary tract symptoms: Secondary | ICD-10-CM | POA: Diagnosis not present

## 2022-06-29 DIAGNOSIS — R3914 Feeling of incomplete bladder emptying: Secondary | ICD-10-CM | POA: Diagnosis not present

## 2022-06-30 DIAGNOSIS — M79604 Pain in right leg: Secondary | ICD-10-CM | POA: Diagnosis not present

## 2022-07-04 DIAGNOSIS — M79604 Pain in right leg: Secondary | ICD-10-CM | POA: Diagnosis not present

## 2022-07-06 ENCOUNTER — Telehealth: Payer: Self-pay

## 2022-07-06 NOTE — Patient Outreach (Signed)
  Care Coordination   07/06/2022 Name: [name] [name] MRN: [id number] DOB: 1941-04-14   Care Coordination Outreach Attempts:  A third unsuccessful outreach was attempted today to offer the patient with information about available care coordination services as a benefit of their health plan.   Follow Up Plan:  No further outreach attempts will be made at this time. We have been unable to contact the patient to offer or enroll patient in care coordination services  Encounter Outcome:  No Answer  Care Coordination Interventions Activated:  No   Care Coordination Interventions:    {THN Tip this will not be part of the note when signed-REQUIRED REPORT FIELNo, not indicatedD DO NOT DELETE (Optional):[id number]}  Jone Baseman, RN, MSN [facility] Care Management Coordinator Direct Line [phone]

## 2022-07-07 DIAGNOSIS — M79604 Pain in right leg: Secondary | ICD-10-CM | POA: Diagnosis not present

## 2022-07-10 DIAGNOSIS — H52203 Unspecified astigmatism, bilateral: Secondary | ICD-10-CM | POA: Diagnosis not present

## 2022-07-10 DIAGNOSIS — H31003 Unspecified chorioretinal scars, bilateral: Secondary | ICD-10-CM | POA: Diagnosis not present

## 2022-07-12 DIAGNOSIS — M79604 Pain in right leg: Secondary | ICD-10-CM | POA: Diagnosis not present

## 2022-07-18 DIAGNOSIS — M79604 Pain in right leg: Secondary | ICD-10-CM | POA: Diagnosis not present

## 2022-07-19 ENCOUNTER — Inpatient Hospital Stay: Payer: Medicare HMO | Attending: Internal Medicine

## 2022-07-19 ENCOUNTER — Other Ambulatory Visit: Payer: Self-pay

## 2022-07-19 DIAGNOSIS — Z8572 Personal history of non-Hodgkin lymphomas: Secondary | ICD-10-CM | POA: Diagnosis not present

## 2022-07-19 DIAGNOSIS — Z452 Encounter for adjustment and management of vascular access device: Secondary | ICD-10-CM | POA: Diagnosis not present

## 2022-07-19 DIAGNOSIS — Z95828 Presence of other vascular implants and grafts: Secondary | ICD-10-CM

## 2022-07-19 DIAGNOSIS — C8251 Diffuse follicle center lymphoma, lymph nodes of head, face, and neck: Secondary | ICD-10-CM

## 2022-07-19 MED ORDER — HEPARIN SOD (PORK) LOCK FLUSH 100 UNIT/ML IV SOLN
500.0000 [IU] | Freq: Once | INTRAVENOUS | Status: AC
  Administered 2022-07-19: 500 [IU]

## 2022-07-19 MED ORDER — SODIUM CHLORIDE 0.9% FLUSH
10.0000 mL | Freq: Once | INTRAVENOUS | Status: AC
  Administered 2022-07-19: 10 mL

## 2022-07-21 DIAGNOSIS — M79604 Pain in right leg: Secondary | ICD-10-CM | POA: Diagnosis not present

## 2022-07-27 DIAGNOSIS — M79604 Pain in right leg: Secondary | ICD-10-CM | POA: Diagnosis not present

## 2022-07-31 DIAGNOSIS — M79604 Pain in right leg: Secondary | ICD-10-CM | POA: Diagnosis not present

## 2022-08-02 DIAGNOSIS — M79604 Pain in right leg: Secondary | ICD-10-CM | POA: Diagnosis not present

## 2022-08-07 DIAGNOSIS — M79604 Pain in right leg: Secondary | ICD-10-CM | POA: Diagnosis not present

## 2022-08-09 DIAGNOSIS — M79604 Pain in right leg: Secondary | ICD-10-CM | POA: Diagnosis not present

## 2022-08-14 DIAGNOSIS — S72141K Displaced intertrochanteric fracture of right femur, subsequent encounter for closed fracture with nonunion: Secondary | ICD-10-CM | POA: Diagnosis not present

## 2022-08-15 DIAGNOSIS — M79604 Pain in right leg: Secondary | ICD-10-CM | POA: Diagnosis not present

## 2022-08-16 DIAGNOSIS — H34813 Central retinal vein occlusion, bilateral, with macular edema: Secondary | ICD-10-CM | POA: Diagnosis not present

## 2022-08-21 DIAGNOSIS — M79604 Pain in right leg: Secondary | ICD-10-CM | POA: Diagnosis not present

## 2022-08-24 DIAGNOSIS — M79604 Pain in right leg: Secondary | ICD-10-CM | POA: Diagnosis not present

## 2022-08-28 DIAGNOSIS — M79604 Pain in right leg: Secondary | ICD-10-CM | POA: Diagnosis not present

## 2022-08-30 ENCOUNTER — Inpatient Hospital Stay: Payer: Medicare HMO | Attending: Internal Medicine

## 2022-08-30 ENCOUNTER — Other Ambulatory Visit: Payer: Self-pay

## 2022-08-30 DIAGNOSIS — Z8572 Personal history of non-Hodgkin lymphomas: Secondary | ICD-10-CM | POA: Diagnosis not present

## 2022-08-30 DIAGNOSIS — Z95828 Presence of other vascular implants and grafts: Secondary | ICD-10-CM

## 2022-08-30 DIAGNOSIS — M79604 Pain in right leg: Secondary | ICD-10-CM | POA: Diagnosis not present

## 2022-08-30 DIAGNOSIS — C8251 Diffuse follicle center lymphoma, lymph nodes of head, face, and neck: Secondary | ICD-10-CM

## 2022-08-30 DIAGNOSIS — Z452 Encounter for adjustment and management of vascular access device: Secondary | ICD-10-CM | POA: Diagnosis not present

## 2022-08-30 MED ORDER — SODIUM CHLORIDE 0.9% FLUSH
10.0000 mL | Freq: Once | INTRAVENOUS | Status: AC
  Administered 2022-08-30: 10 mL

## 2022-08-30 MED ORDER — HEPARIN SOD (PORK) LOCK FLUSH 100 UNIT/ML IV SOLN
500.0000 [IU] | Freq: Once | INTRAVENOUS | Status: AC
  Administered 2022-08-30: 500 [IU]

## 2022-09-05 DIAGNOSIS — M79604 Pain in right leg: Secondary | ICD-10-CM | POA: Diagnosis not present

## 2022-09-07 DIAGNOSIS — M79604 Pain in right leg: Secondary | ICD-10-CM | POA: Diagnosis not present

## 2022-09-12 DIAGNOSIS — M79604 Pain in right leg: Secondary | ICD-10-CM | POA: Diagnosis not present

## 2022-09-14 DIAGNOSIS — M79604 Pain in right leg: Secondary | ICD-10-CM | POA: Diagnosis not present

## 2022-09-19 DIAGNOSIS — M79604 Pain in right leg: Secondary | ICD-10-CM | POA: Diagnosis not present

## 2022-09-21 DIAGNOSIS — M79604 Pain in right leg: Secondary | ICD-10-CM | POA: Diagnosis not present

## 2022-09-26 DIAGNOSIS — M79604 Pain in right leg: Secondary | ICD-10-CM | POA: Diagnosis not present

## 2022-09-28 DIAGNOSIS — M79604 Pain in right leg: Secondary | ICD-10-CM | POA: Diagnosis not present

## 2022-10-09 ENCOUNTER — Other Ambulatory Visit: Payer: Self-pay

## 2022-10-09 DIAGNOSIS — C8251 Diffuse follicle center lymphoma, lymph nodes of head, face, and neck: Secondary | ICD-10-CM

## 2022-10-10 DIAGNOSIS — M79604 Pain in right leg: Secondary | ICD-10-CM | POA: Diagnosis not present

## 2022-10-11 ENCOUNTER — Other Ambulatory Visit: Payer: Self-pay

## 2022-10-11 ENCOUNTER — Inpatient Hospital Stay: Payer: Medicare HMO | Attending: Internal Medicine | Admitting: Internal Medicine

## 2022-10-11 ENCOUNTER — Inpatient Hospital Stay: Payer: Medicare HMO

## 2022-10-11 VITALS — BP 157/86 | HR 83 | Temp 98.0°F | Resp 19 | Wt 171.6 lb

## 2022-10-11 DIAGNOSIS — Z8572 Personal history of non-Hodgkin lymphomas: Secondary | ICD-10-CM | POA: Insufficient documentation

## 2022-10-11 DIAGNOSIS — Z79899 Other long term (current) drug therapy: Secondary | ICD-10-CM | POA: Insufficient documentation

## 2022-10-11 DIAGNOSIS — Z95828 Presence of other vascular implants and grafts: Secondary | ICD-10-CM

## 2022-10-11 DIAGNOSIS — Z9221 Personal history of antineoplastic chemotherapy: Secondary | ICD-10-CM | POA: Diagnosis not present

## 2022-10-11 DIAGNOSIS — I10 Essential (primary) hypertension: Secondary | ICD-10-CM | POA: Insufficient documentation

## 2022-10-11 DIAGNOSIS — C8251 Diffuse follicle center lymphoma, lymph nodes of head, face, and neck: Secondary | ICD-10-CM

## 2022-10-11 DIAGNOSIS — Z452 Encounter for adjustment and management of vascular access device: Secondary | ICD-10-CM | POA: Diagnosis not present

## 2022-10-11 LAB — CBC WITH DIFFERENTIAL (CANCER CENTER ONLY)
Abs Immature Granulocytes: 0.02 10*3/uL (ref 0.00–0.07)
Basophils Absolute: 0.1 10*3/uL (ref 0.0–0.1)
Basophils Relative: 1 %
Eosinophils Absolute: 0.2 10*3/uL (ref 0.0–0.5)
Eosinophils Relative: 4 %
HCT: 41.7 % (ref 39.0–52.0)
Hemoglobin: 14 g/dL (ref 13.0–17.0)
Immature Granulocytes: 0 %
Lymphocytes Relative: 29 %
Lymphs Abs: 1.9 10*3/uL (ref 0.7–4.0)
MCH: 29 pg (ref 26.0–34.0)
MCHC: 33.6 g/dL (ref 30.0–36.0)
MCV: 86.3 fL (ref 80.0–100.0)
Monocytes Absolute: 0.4 10*3/uL (ref 0.1–1.0)
Monocytes Relative: 6 %
Neutro Abs: 3.9 10*3/uL (ref 1.7–7.7)
Neutrophils Relative %: 60 %
Platelet Count: 166 10*3/uL (ref 150–400)
RBC: 4.83 MIL/uL (ref 4.22–5.81)
RDW: 14.3 % (ref 11.5–15.5)
WBC Count: 6.5 10*3/uL (ref 4.0–10.5)
nRBC: 0 % (ref 0.0–0.2)

## 2022-10-11 LAB — CMP (CANCER CENTER ONLY)
ALT: 17 U/L (ref 0–44)
AST: 20 U/L (ref 15–41)
Albumin: 4.1 g/dL (ref 3.5–5.0)
Alkaline Phosphatase: 80 U/L (ref 38–126)
Anion gap: 6 (ref 5–15)
BUN: 13 mg/dL (ref 8–23)
CO2: 28 mmol/L (ref 22–32)
Calcium: 9.4 mg/dL (ref 8.9–10.3)
Chloride: 105 mmol/L (ref 98–111)
Creatinine: 1.1 mg/dL (ref 0.61–1.24)
GFR, Estimated: >60 mL/min (ref >60)
Glucose, Bld: 134 mg/dL — ABNORMAL HIGH (ref 70–99)
Potassium: 4.1 mmol/L (ref 3.5–5.1)
Sodium: 139 mmol/L (ref 135–145)
Total Bilirubin: 0.9 mg/dL (ref 0.3–1.2)
Total Protein: 7.4 g/dL (ref 6.5–8.1)

## 2022-10-11 LAB — LACTATE DEHYDROGENASE: LDH: 162 U/L (ref 98–192)

## 2022-10-11 MED ORDER — HEPARIN SOD (PORK) LOCK FLUSH 100 UNIT/ML IV SOLN
500.0000 [IU] | Freq: Once | INTRAVENOUS | Status: AC
  Administered 2022-10-11: 500 [IU]

## 2022-10-11 MED ORDER — SODIUM CHLORIDE 0.9% FLUSH
10.0000 mL | Freq: Once | INTRAVENOUS | Status: AC
  Administered 2022-10-11: 10 mL

## 2022-10-11 NOTE — Progress Notes (Signed)
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  CURRENT THERAPY: Observation.   INTERVAL HISTORY: [name] [name] 82 y.o. male returns to the clinic today for follow-up visit.  The patient is feeling fine today with no concerning complaints.  He had some mobility issues and using a walker after he has right hip fracture in February 2023.  He denied having any current chest pain, shortness of breath, cough or hemoptysis.  He denied having any nausea, vomiting, diarrhea but has occasional constipation.  He has no headache or visual changes.  The patient is here today for evaluation and repeat blood work.    MEDICAL HISTORY: Past Medical History:  Diagnosis Date   Basal cell carcinoma 06/08/1998   Upper lip - CX3+5FU   Basal cell carcinoma 06/22/1998   Left front neck - CX3+5FU   Basal cell carcinoma 03/11/2004   Right temple [facility])   Basal cell carcinoma 04/24/2016   Left top shoulder - Clear   Basal cell carcinoma 01/14/2018   Right sideburn - CX3+5FU   Basal cell carcinoma 06/08/2020   sup & nod- left postauricular sulcus   Complication of anesthesia    Femur fracture (HCC)    Hip fracture (HCC)    Hypertension    Malignant neoplasm of pancreas, part unspecified    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   Right Ear Superior   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   Right Sideburn   Other malignant lymphomas, unspecified site, extranodal and solid organ  sites    Squamous cell carcinoma of skin 09/26/2001   Left forehead - CX3+excision   Squamous cell carcinoma of skin 03/11/2004   Post right scalp - CX3+5FU   Squamous cell carcinoma of skin 03/11/2004   Left forehead (MOHs)   Squamous cell carcinoma of skin 03/11/2004   Right sideburn - CX3+5FU+excision   Squamous cell carcinoma of skin 12/01/2004   Left temple - CX3+excision   Squamous cell carcinoma of skin 04/13/2006   Mid scalp, sup - tx p bx   Squamous cell carcinoma of skin 04/13/2006   Mid scalp, inf - tx p bx   Squamous cell carcinoma of skin 04/13/2006   Left temple - tx p bx   Squamous cell carcinoma of skin 06/30/2010   Left low back - tx p bx   Squamous cell carcinoma of skin 06/23/2013   Front scalp - CX3+5FU   Squamous cell carcinoma of skin 01/14/2018   Left scalp - CX3+5FU    ALLERGIES:  is allergic to morphine and related, shellfish allergy, iohexol, and sulfa antibiotics.  MEDICATIONS:  Current Outpatient Medications  Medication Sig Dispense Refill   Aflibercept 2 MG/0.05ML SOLN Inject 0.5 mLs into the eye as directed. Take every 12 wks in eye doctor office.     alum & mag hydroxide-simeth (MAALOX/MYLANTA) 200-200-20 MG/5ML suspension Take 15 mLs by mouth every 6 (six) hours as needed for indigestion or heartburn. 355 mL 0   AMBULATORY NON FORMULARY MEDICATION Take 3 tablets by mouth 2 (two) times daily.  Medication Name: Vollara supplement (multivitamin)     amLODipine (NORVASC) 5 MG tablet Take 5 mg by mouth daily.     Ascorbic Acid (VITAMIN C) 1000 MG tablet Take 4,000 mg by mouth daily. Pt states he takes 4,'000mg'$      bisacodyl (DULCOLAX) 10 MG suppository Place 1 suppository (10 mg total) rectally daily as needed for moderate constipation. 12 suppository 0   Cholecalciferol (VITAMIN D) 125 MCG (5000 UT) CAPS Take 5,000-10,000 Units by mouth daily.     feeding supplement (ENSURE ENLIVE / ENSURE PLUS) LIQD Take 237 mLs by mouth 3 (three) times daily between  meals. 237 mL 12   lisinopril (ZESTRIL) 20 MG tablet Take 20 mg by mouth daily.     LUTEIN PO Take 1 tablet by mouth daily. Eye vitamin     MAGNESIUM OXIDE PO Take 1,000 mg by mouth daily. 500 mg per tab     Misc Natural Products (PROSTATE HEALTH PO) Take 1 capsule by mouth daily. Prostagenix     Nutritional Supplements (,FEEDING SUPPLEMENT, PROSOURCE PLUS) liquid Take 30 mLs by mouth 2 (two) times daily between meals.     senna-docusate (SENOKOT-S) 8.6-50 MG tablet Take 2 tablets by mouth 2 (two) times daily.     Turmeric (QC TUMERIC COMPLEX PO) Take 1 tablet by mouth daily in the afternoon.     Vitamin D-Vitamin K (VITAMIN K2-VITAMIN D3 PO) Take 1 tablet by mouth daily. 100 mcg / 1000 units     Zinc 50 MG TABS Take 50 mg by mouth daily in the afternoon.     No current facility-administered medications for this visit.   Facility-Administered Medications Ordered in Other Visits  Medication Dose Route Frequency Provider Last Rate Last Admin   heparin lock flush 100 unit/mL  500 Units Intravenous Once PRN Curt Bears, MD       sodium chloride 0.9 % injection 10 mL  10 mL Intravenous PRN Curt Bears, MD   10 mL at 12/29/14 1026   sodium chloride 0.9 % injection 10 mL  10 mL Intravenous PRN Curt Bears, MD        SURGICAL HISTORY:  Past Surgical History:  Procedure Laterality Date   BIOPSY  11/03/2021   Procedure: BIOPSY;  Surgeon: Lavena Bullion, DO;  Location: [facility];  Service: Gastroenterology;;   BIOPSY  01/17/2022   Procedure: BIOPSY;  Surgeon: Lavena Bullion, DO;  Location: [facility];  Service: Gastroenterology;;   ESOPHAGOGASTRODUODENOSCOPY (EGD) WITH PROPOFOL N/A 11/03/2021   Procedure: ESOPHAGOGASTRODUODENOSCOPY (EGD) WITH PROPOFOL;  Surgeon: Lavena Bullion, DO;  Location: [place];  Service: Gastroenterology;  Laterality: N/A;   ESOPHAGOGASTRODUODENOSCOPY (EGD) WITH PROPOFOL N/A 01/17/2022   Procedure: ESOPHAGOGASTRODUODENOSCOPY (EGD) WITH  PROPOFOL;  Surgeon: Lavena Bullion, DO;  Location: [facility];  Service: Gastroenterology;  Laterality: N/A;   FEMUR IM NAIL Left 10/06/2019   Procedure: INTRAMEDULLARY (IM) RETROGRADE FEMORAL NAIL REMOVAL ( [employer]);  Surgeon: Marybelle Killings, MD;  Location: [place];  Service: Orthopedics;  Laterality: Left;   FEMUR SURGERY Bilateral    HARDWARE REMOVAL Right 10/28/2021   Procedure: HARDWARE REMOVAL;  Surgeon: Shona Needles, MD;  Location: [place];  Service: Orthopedics;  Laterality: Right;   INTRAMEDULLARY (IM) NAIL INTERTROCHANTERIC Left 10/06/2019   Procedure: BIOMET AFFIXUS SHORT NAIL,  INTERTROCHANTRIC;  Surgeon: Marybelle Killings, MD;  Location: [place];  Service: Orthopedics;  Laterality: Left;   INTRAMEDULLARY (IM) NAIL INTERTROCHANTERIC Right 10/28/2021   Procedure: INTRAMEDULLARY (IM) NAIL INTERTROCHANTRIC;  Surgeon: Shona Needles, MD;  Location: [place];  Service: Orthopedics;  Laterality: Right;   LEG SURGERY Left    SHOULDER SURGERY Left     REVIEW OF SYSTEMS:  A comprehensive review of systems was negative except for: Musculoskeletal: positive for arthralgias   PHYSICAL EXAMINATION: General appearance: alert, cooperative and no distress Head: Normocephalic, without obvious abnormality, atraumatic Neck: no adenopathy, no JVD, supple, symmetrical, trachea midline and thyroid not enlarged, symmetric, no tenderness/mass/nodules Lymph nodes: Cervical, supraclavicular, and axillary nodes normal. Resp: clear to auscultation bilaterally Back: symmetric, no curvature. ROM normal. No CVA tenderness. Cardio: regular rate and rhythm, S1, S2 normal, no murmur, click, rub or gallop GI: soft, non-tender; bowel sounds normal; no masses,  no organomegaly Extremities: extremities normal, atraumatic, no cyanosis or edema  ECOG PERFORMANCE STATUS: 1 - Symptomatic but completely ambulatory  Blood pressure (!) 157/86, pulse 83, temperature 98 F (36.7 C), temperature source Oral, resp. rate 19,  weight 171 lb 9 oz (77.8 kg), SpO2 100 %.  LABORATORY DATA: Lab Results  Component Value Date   WBC 8.7 11/07/2021   HGB 10.7 (L) 11/07/2021   HCT 32.4 (L) 11/07/2021   MCV 88.3 11/07/2021   PLT 339 11/07/2021      Chemistry      Component Value Date/Time   NA 136 11/07/2021 0502   NA 140 01/23/2017 0958   K 3.8 11/07/2021 0502   K 4.0 01/23/2017 0958   CL 102 11/07/2021 0502   CL 103 11/21/2012 0930   CO2 26 11/07/2021 0502   CO2 28 01/23/2017 0958   BUN 16 11/07/2021 0502   BUN 17.0 01/23/2017 0958   CREATININE 1.08 11/07/2021 0502   CREATININE 1.07 09/27/2021 0956   CREATININE 1.3 01/23/2017 0958      Component Value Date/Time   CALCIUM 8.5 (L) 11/07/2021 0502   CALCIUM 10.2 01/23/2017 0958   ALKPHOS 63 10/28/2021 0140   ALKPHOS 61 01/23/2017 0958   AST 26 10/28/2021 0140   AST 22 09/27/2021 0956   AST 20 01/23/2017 0958   ALT 17 10/28/2021 0140   ALT 21 09/27/2021 0956   ALT 21 01/23/2017 0958   BILITOT 1.4 (H) 10/28/2021 0140   BILITOT 0.8 09/27/2021 0956   BILITOT 1.14 01/23/2017 0958       RADIOGRAPHIC STUDIES: No results found.  ASSESSMENT AND PLAN:  This is a very pleasant 82 years old white male with history of large B-cell non-Hodgkin lymphoma presented with a large mass close to the head of the pancreas status post systemic chemotherapy with CHOP/Rituxan for 4 cycles in addition to hyperthermic therapy in [place] with almost complete response of his disease. The patient has been on observation for more than 15 years now and he is doing fine with no concerning complaints. Repeat CBC today is unremarkable.  Comprehensive metabolic panel and LDH are still pending. I recommended for the patient to continue on observation with repeat blood work in 1 year. He was advised to call immediately if he has any other concerning symptoms in the interval. The patient voices understanding of current disease status and treatment options and is in agreement with the  current care plan. All questions were answered. The patient knows to call the clinic with any problems, questions or concerns. We can certainly see the patient much sooner if necessary.  Disclaimer: This note was dictated with voice recognition software. Similar sounding words can inadvertently be transcribed and may not be corrected upon review.

## 2022-10-12 DIAGNOSIS — M79604 Pain in right leg: Secondary | ICD-10-CM | POA: Diagnosis not present

## 2022-10-17 DIAGNOSIS — M79604 Pain in right leg: Secondary | ICD-10-CM | POA: Diagnosis not present

## 2022-10-19 DIAGNOSIS — M79604 Pain in right leg: Secondary | ICD-10-CM | POA: Diagnosis not present

## 2022-10-24 ENCOUNTER — Telehealth: Payer: Self-pay | Admitting: Internal Medicine

## 2022-10-24 DIAGNOSIS — M79604 Pain in right leg: Secondary | ICD-10-CM | POA: Diagnosis not present

## 2022-10-24 NOTE — Telephone Encounter (Signed)
Called patient regarding upcoming appointment, left a voicemail. 

## 2022-10-25 DIAGNOSIS — H34813 Central retinal vein occlusion, bilateral, with macular edema: Secondary | ICD-10-CM | POA: Diagnosis not present

## 2022-10-25 DIAGNOSIS — H43813 Vitreous degeneration, bilateral: Secondary | ICD-10-CM | POA: Diagnosis not present

## 2022-10-25 DIAGNOSIS — H35373 Puckering of macula, bilateral: Secondary | ICD-10-CM | POA: Diagnosis not present

## 2022-10-25 DIAGNOSIS — H43391 Other vitreous opacities, right eye: Secondary | ICD-10-CM | POA: Diagnosis not present

## 2022-10-26 DIAGNOSIS — M79604 Pain in right leg: Secondary | ICD-10-CM | POA: Diagnosis not present

## 2022-10-31 DIAGNOSIS — M79604 Pain in right leg: Secondary | ICD-10-CM | POA: Diagnosis not present

## 2022-11-02 DIAGNOSIS — M79604 Pain in right leg: Secondary | ICD-10-CM | POA: Diagnosis not present

## 2022-11-07 DIAGNOSIS — M79604 Pain in right leg: Secondary | ICD-10-CM | POA: Diagnosis not present

## 2022-11-09 ENCOUNTER — Ambulatory Visit
Admission: RE | Admit: 2022-11-09 | Discharge: 2022-11-09 | Disposition: A | Discharge status: Home / Self Care | Payer: Medicare HMO | Source: Ambulatory Visit | Attending: Physician Assistant | Admitting: Physician Assistant

## 2022-11-09 ENCOUNTER — Other Ambulatory Visit: Payer: Self-pay | Admitting: Physician Assistant

## 2022-11-09 DIAGNOSIS — Z95828 Presence of other vascular implants and grafts: Secondary | ICD-10-CM | POA: Diagnosis not present

## 2022-11-09 DIAGNOSIS — H348132 Central retinal vein occlusion, bilateral, stable: Secondary | ICD-10-CM | POA: Diagnosis not present

## 2022-11-09 DIAGNOSIS — R0989 Other specified symptoms and signs involving the circulatory and respiratory systems: Secondary | ICD-10-CM | POA: Diagnosis not present

## 2022-11-09 DIAGNOSIS — Z452 Encounter for adjustment and management of vascular access device: Secondary | ICD-10-CM | POA: Diagnosis not present

## 2022-11-09 DIAGNOSIS — E559 Vitamin D deficiency, unspecified: Secondary | ICD-10-CM | POA: Diagnosis not present

## 2022-11-09 DIAGNOSIS — M79604 Pain in right leg: Secondary | ICD-10-CM | POA: Diagnosis not present

## 2022-11-09 DIAGNOSIS — Z5320 Procedure and treatment not carried out because of patient's decision for unspecified reasons: Secondary | ICD-10-CM | POA: Diagnosis not present

## 2022-11-09 DIAGNOSIS — I10 Essential (primary) hypertension: Secondary | ICD-10-CM | POA: Diagnosis not present

## 2022-11-09 DIAGNOSIS — Z13220 Encounter for screening for lipoid disorders: Secondary | ICD-10-CM | POA: Diagnosis not present

## 2022-11-09 DIAGNOSIS — Z0000 Encounter for general adult medical examination without abnormal findings: Secondary | ICD-10-CM | POA: Diagnosis not present

## 2022-11-09 DIAGNOSIS — E1139 Type 2 diabetes mellitus with other diabetic ophthalmic complication: Secondary | ICD-10-CM | POA: Diagnosis not present

## 2022-11-09 DIAGNOSIS — Z136 Encounter for screening for cardiovascular disorders: Secondary | ICD-10-CM | POA: Diagnosis not present

## 2022-11-09 DIAGNOSIS — R2681 Unsteadiness on feet: Secondary | ICD-10-CM | POA: Diagnosis not present

## 2022-11-09 DIAGNOSIS — I700 Atherosclerosis of aorta: Secondary | ICD-10-CM | POA: Diagnosis not present

## 2022-11-09 DIAGNOSIS — Z9181 History of falling: Secondary | ICD-10-CM | POA: Diagnosis not present

## 2022-11-14 DIAGNOSIS — M79604 Pain in right leg: Secondary | ICD-10-CM | POA: Diagnosis not present

## 2022-11-16 DIAGNOSIS — M79604 Pain in right leg: Secondary | ICD-10-CM | POA: Diagnosis not present

## 2022-11-21 DIAGNOSIS — M79604 Pain in right leg: Secondary | ICD-10-CM | POA: Diagnosis not present

## 2022-11-21 DIAGNOSIS — S72141K Displaced intertrochanteric fracture of right femur, subsequent encounter for closed fracture with nonunion: Secondary | ICD-10-CM | POA: Diagnosis not present

## 2022-11-23 DIAGNOSIS — M79604 Pain in right leg: Secondary | ICD-10-CM | POA: Diagnosis not present

## 2022-12-04 ENCOUNTER — Ambulatory Visit (INDEPENDENT_AMBULATORY_CARE_PROVIDER_SITE_OTHER): Payer: Medicare HMO | Admitting: Orthopaedic Surgery

## 2022-12-04 ENCOUNTER — Other Ambulatory Visit (INDEPENDENT_AMBULATORY_CARE_PROVIDER_SITE_OTHER): Payer: Medicare HMO

## 2022-12-04 ENCOUNTER — Encounter: Payer: Self-pay | Admitting: Orthopaedic Surgery

## 2022-12-04 VITALS — BP 145/82 | Ht 69.0 in | Wt 170.0 lb

## 2022-12-04 DIAGNOSIS — M545 Low back pain, unspecified: Secondary | ICD-10-CM

## 2022-12-04 DIAGNOSIS — S32040A Wedge compression fracture of fourth lumbar vertebra, initial encounter for closed fracture: Secondary | ICD-10-CM

## 2022-12-04 MED ORDER — TRAMADOL HCL 50 MG PO TABS: 50.0000 mg | ORAL_TABLET | Freq: Three times a day (TID) | ORAL | 1 refills | Status: AC | PRN

## 2022-12-04 NOTE — Progress Notes (Unsigned)
Office Visit Note   Patient: [name] [name]           Date of Birth: November 19, 1940           MRN: [id number] Visit Date: 12/04/2022              Requested by: No referring provider defined for this encounter. PCP: [name], MD (Inactive)   Assessment & Plan: Visit Diagnoses:  1. Acute bilateral low back pain, unspecified whether sciatica present   2. Compression fracture of L4 vertebra, initial encounter ([place])     Plan: Recommend proceeding with a bone density test with his previous history of femur fractures, right and left inotrope fractures and now L4 compression fracture and history of lymphoma.  Recheck 6 weeks.  We discussed avoiding lifting forward bending.  Fall prevention discussed.  Lateral lumbar x-ray return in 6 weeks.  Follow-Up Instructions: Return in about 6 weeks (around 01/15/2023).   Orders:  Orders Placed This Encounter  Procedures   XR Lumbar Spine 2-3 Views   DG BONE DENSITY (DXA)   Meds ordered this encounter  Medications   traMADol (ULTRAM) 50 MG tablet    Sig: Take 1 tablet (50 mg total) by mouth every 8 (eight) hours as needed.    Dispense:  30 tablet    Refill:  1    L4 COMPRESSION FX      Procedures: No procedures performed   Clinical Data: No additional findings.   Subjective: Chief Complaint  Patient presents with   Lower Back - Pain    DOI 11/23/2022    HPI 82 year old male with history of right and left femur fractures in the past later inotrope fractures above requiring removal to retrograde nails and then fixation of the hip fractures I did the left Dr. Doreatha Martin did the right hip.  He is now seen in follow-up after resolved high-pressure traumatic compression fracture.  Past history of lymphoma thrombocytopenia Port-A-Cath, hypertension GERD.  Review of Systems   Objective: Vital Signs: BP (!) 145/82   Ht 5\' 9"  (1.753 m)   Wt 170 lb (77.1 kg)   BMI 25.10 kg/m   Physical Exam  Ortho Exam  Specialty Comments:  No  specialty comments available.  Imaging: No results found.   PMFS History: Patient Active Problem List   Diagnosis Date Noted   Lumbar compression fracture ([place]) 12/05/2022   Esophageal dysphagia    Acute esophagitis    Acute postoperative anemia due to expected blood loss 11/02/2021   Odynophagia    Constipation 10/29/2021   GERD (gastroesophageal reflux disease) 10/29/2021   Hyponatremia 10/29/2021   Closed right hip fracture ([place]) 10/28/2021   Multiple facial fractures, closed, initial encounter ([place]) 02/04/2020   Closed left hip fracture ([place]) 10/04/2019   Essential hypertension 10/04/2019   Fall    Port-A-Cath in place 02/04/2019   Thrombocytopenia ([place]) 01/22/2018   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   History of arthroscopy of left shoulder 07/12/2016   Port catheter in place [id number]   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[place]) 12/28/2015   Vitamin D deficiency 11/21/2012   Lymphoma ([place]) 06/13/2012   Past Medical History:  Diagnosis Date   Basal cell carcinoma 06/08/1998   Upper lip - CX3+5FU   Basal cell carcinoma 06/22/1998   Left front neck - CX3+5FU   Basal cell carcinoma 03/11/2004   Right temple (MOHs)   Basal cell carcinoma  04/24/2016   Left top shoulder - Clear   Basal cell carcinoma 01/14/2018   Right sideburn - CX3+5FU   Basal cell carcinoma 06/08/2020   sup & nod- left postauricular sulcus   Complication of anesthesia    Femur fracture (HCC)    Hip fracture (HCC)    Hypertension    Malignant neoplasm of pancreas, part unspecified    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   Right Ear Superior   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   Right Sideburn   Other malignant lymphomas, unspecified site, extranodal and solid organ sites    Squamous cell carcinoma of skin 09/26/2001   Left forehead - CX3+excision   Squamous  cell carcinoma of skin 03/11/2004   Post right scalp - CX3+5FU   Squamous cell carcinoma of skin 03/11/2004   Left forehead (MOHs)   Squamous cell carcinoma of skin 03/11/2004   Right sideburn - CX3+5FU+excision   Squamous cell carcinoma of skin 12/01/2004   Left temple - CX3+excision   Squamous cell carcinoma of skin 04/13/2006   Mid scalp, sup - tx p bx   Squamous cell carcinoma of skin 04/13/2006   Mid scalp, inf - tx p bx   Squamous cell carcinoma of skin 04/13/2006   Left temple - tx p bx   Squamous cell carcinoma of skin 06/30/2010   Left low back - tx p bx   Squamous cell carcinoma of skin 06/23/2013   Front scalp - CX3+5FU   Squamous cell carcinoma of skin 01/14/2018   Left scalp - CX3+5FU    No family history on file.  Past Surgical History:  Procedure Laterality Date   BIOPSY  11/03/2021   Procedure: BIOPSY;  Surgeon: Lavena Bullion, DO;  Location: [facility] [place] [facility];  Service: Gastroenterology;;   BIOPSY  01/17/2022   Procedure: BIOPSY;  Surgeon: Lavena Bullion, DO;  [facility];  Service: Gastroenterology;;   ESOPHAGOGASTRODUODENOSCOPY (EGD) WITH PROPOFOL N/A 11/03/2021   Procedure: ESOPHAGOGASTRODUODENOSCOPY (EGD) WITH PROPOFOL;  Surgeon: Lavena Bullion, DO;  Location: [place];  Service: Gastroenterology;  Laterality: N/A;   ESOPHAGOGASTRODUODENOSCOPY (EGD) WITH PROPOFOL N/A 01/17/2022   Procedure: ESOPHAGOGASTRODUODENOSCOPY (EGD) WITH PROPOFOL;  Surgeon: Lavena Bullion, DO;  [facility];  Service: Gastroenterology;  Laterality: N/A;   FEMUR IM NAIL Left 10/06/2019   Procedure: INTRAMEDULLARY (IM) RETROGRADE FEMORAL NAIL REMOVAL ( [employer]);  Surgeon: Marybelle Killings, MD;  Location: [place];  Service: Orthopedics;  Laterality: Left;   FEMUR SURGERY Bilateral    HARDWARE REMOVAL Right 10/28/2021   Procedure: HARDWARE REMOVAL;  Surgeon: Shona Needles, MD;  Location: [place];  Service: Orthopedics;  Laterality: Right;   INTRAMEDULLARY (IM)  NAIL INTERTROCHANTERIC Left 10/06/2019   Procedure: BIOMET AFFIXUS SHORT NAIL,  INTERTROCHANTRIC;  Surgeon: Marybelle Killings, MD;  Location: [place];  Service: Orthopedics;  Laterality: Left;   INTRAMEDULLARY (IM) NAIL INTERTROCHANTERIC Right 10/28/2021   Procedure: INTRAMEDULLARY (IM) NAIL INTERTROCHANTRIC;  Surgeon: Shona Needles, MD;  Location: [place];  Service: Orthopedics;  Laterality: Right;   LEG SURGERY Left    SHOULDER SURGERY Left    Social History   Occupational History   Not on file  Tobacco Use   Smoking status: Never   Smokeless tobacco: Never  Vaping Use   Vaping Use: Never used  Substance and Sexual Activity   Alcohol use: No   Drug use: No   Sexual activity: Not on file

## 2022-12-05 DIAGNOSIS — S32000A Wedge compression fracture of unspecified lumbar vertebra, initial encounter for closed fracture: Secondary | ICD-10-CM | POA: Insufficient documentation

## 2023-01-12 DIAGNOSIS — Z882 Allergy status to sulfonamides status: Secondary | ICD-10-CM | POA: Diagnosis not present

## 2023-01-12 DIAGNOSIS — R2681 Unsteadiness on feet: Secondary | ICD-10-CM | POA: Diagnosis not present

## 2023-01-12 DIAGNOSIS — I10 Essential (primary) hypertension: Secondary | ICD-10-CM | POA: Diagnosis not present

## 2023-01-12 DIAGNOSIS — Z8572 Personal history of non-Hodgkin lymphomas: Secondary | ICD-10-CM | POA: Diagnosis not present

## 2023-01-12 DIAGNOSIS — H348192 Central retinal vein occlusion, unspecified eye, stable: Secondary | ICD-10-CM | POA: Diagnosis not present

## 2023-01-12 DIAGNOSIS — Z961 Presence of intraocular lens: Secondary | ICD-10-CM | POA: Diagnosis not present

## 2023-01-12 DIAGNOSIS — M1990 Unspecified osteoarthritis, unspecified site: Secondary | ICD-10-CM | POA: Diagnosis not present

## 2023-01-12 DIAGNOSIS — Z833 Family history of diabetes mellitus: Secondary | ICD-10-CM | POA: Diagnosis not present

## 2023-01-12 DIAGNOSIS — Z809 Family history of malignant neoplasm, unspecified: Secondary | ICD-10-CM | POA: Diagnosis not present

## 2023-01-12 DIAGNOSIS — Z8249 Family history of ischemic heart disease and other diseases of the circulatory system: Secondary | ICD-10-CM | POA: Diagnosis not present

## 2023-01-13 IMAGING — CR DG HIP (WITH OR WITHOUT PELVIS) 2-3V*R*
3 series · 3 of 3 positions shown · non-contrast
Comparison: 10/28/2021

CLINICAL DATA: 80-year-old male with right hip pain and soreness
after recent surgery.

EXAM:
DG HIP (WITH OR WITHOUT PELVIS) 2-3V RIGHT

[pelvis ap]
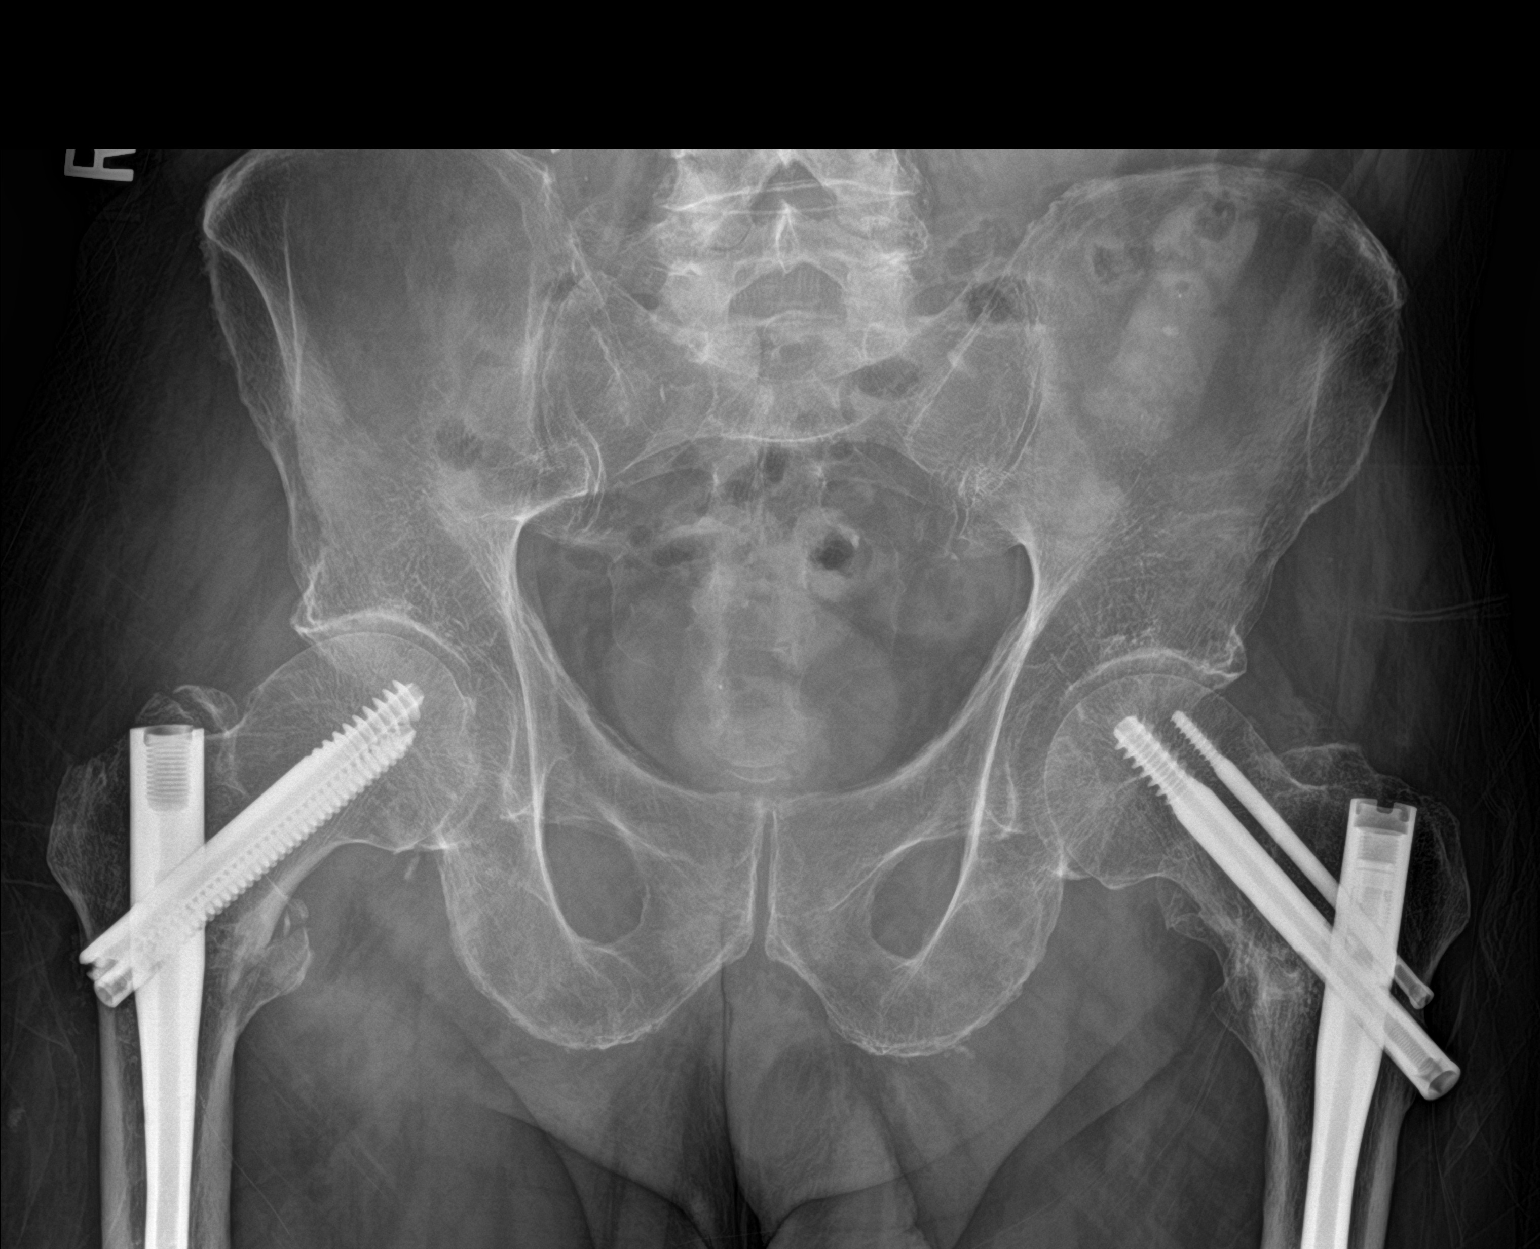

[hip ap]
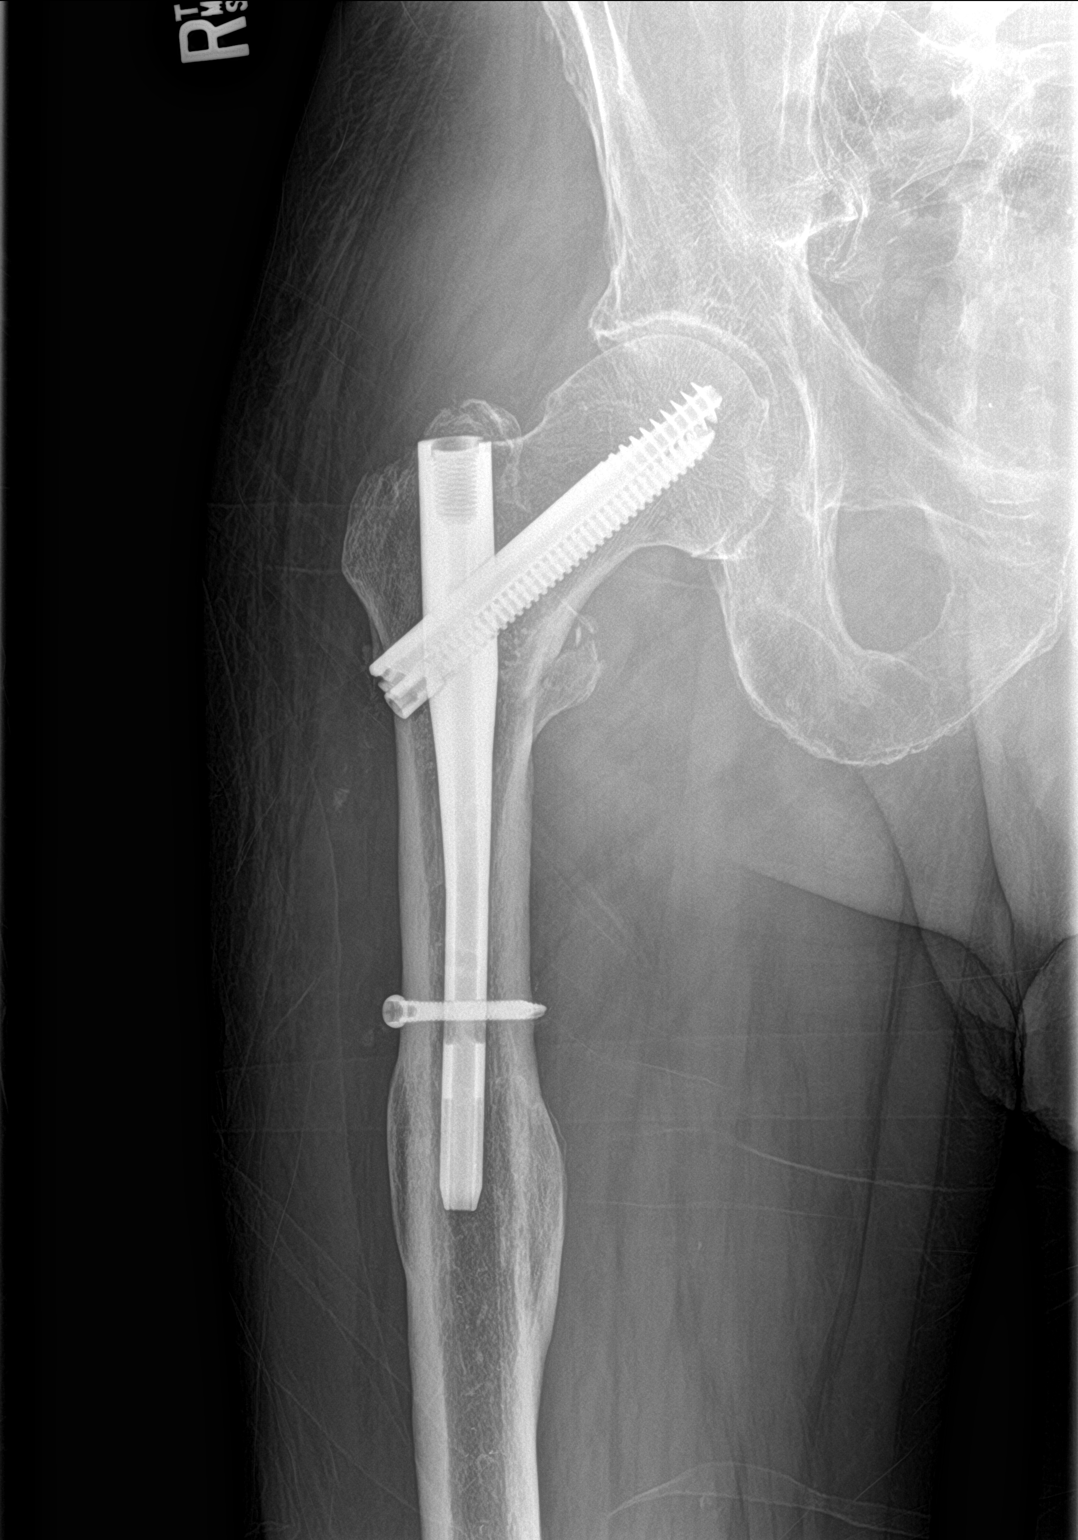

[hip lat]
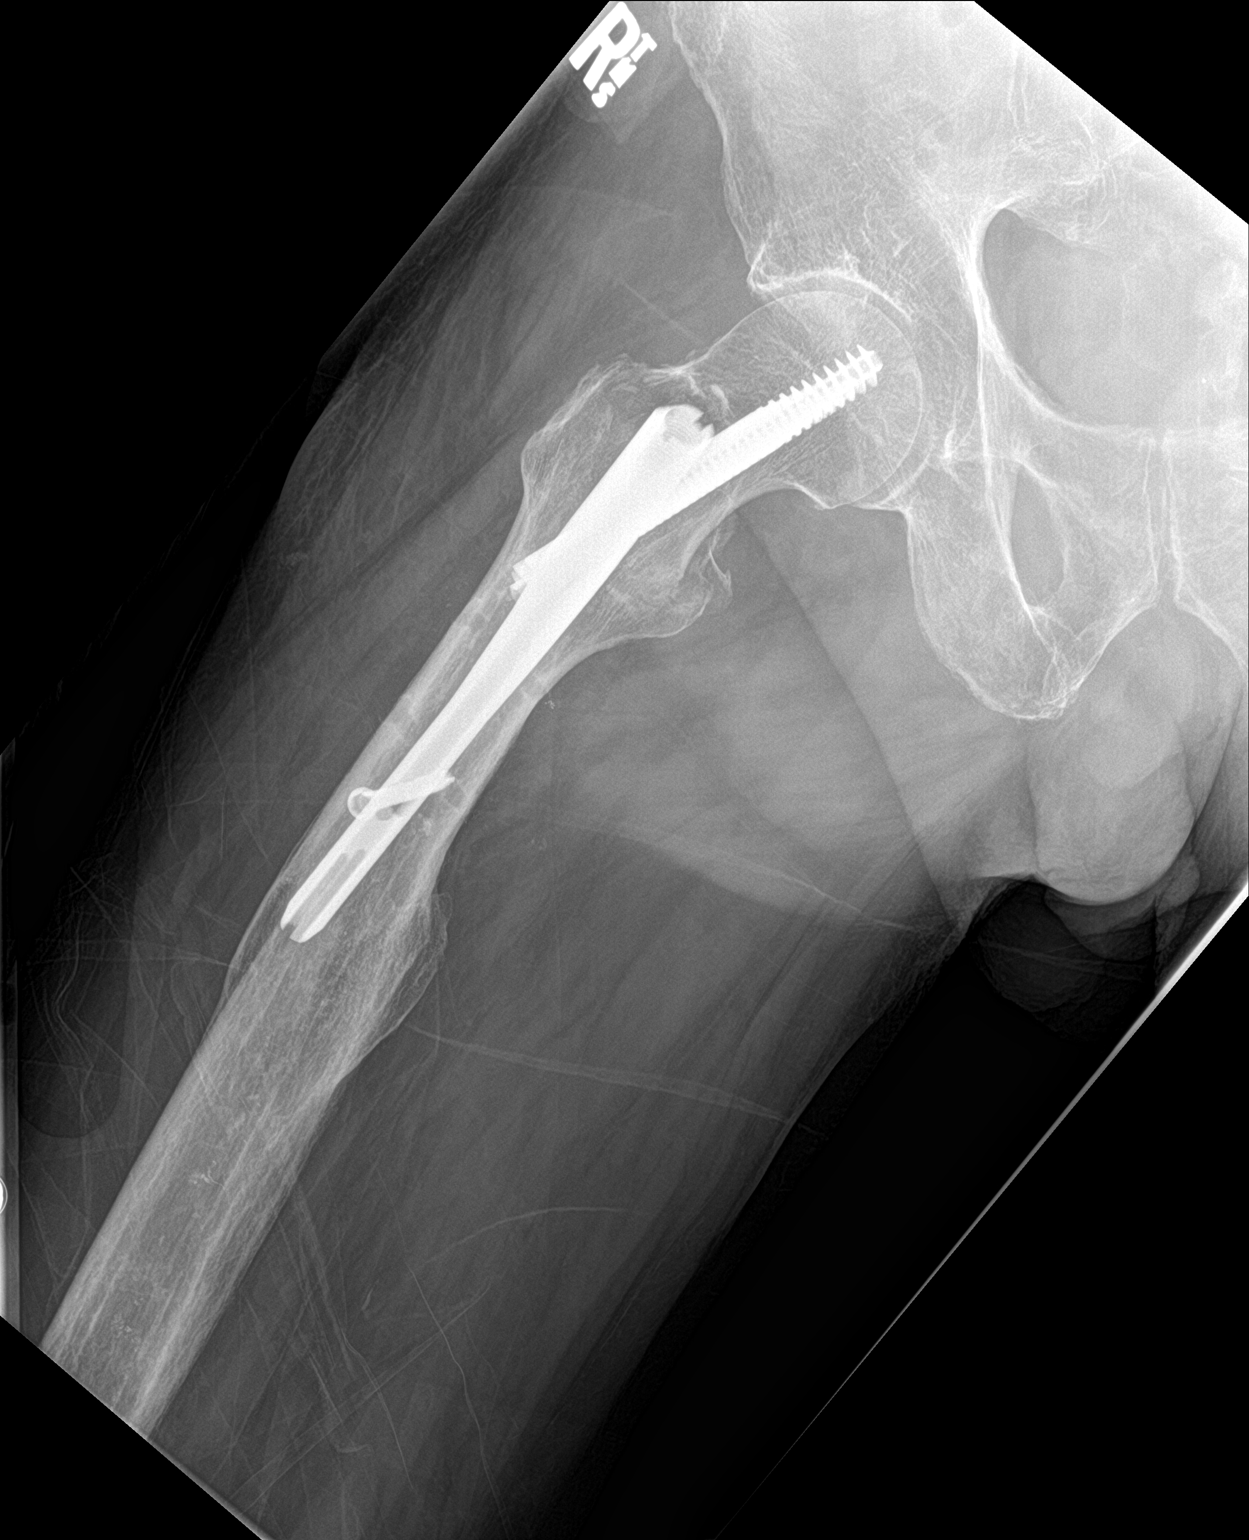

[3 of 3 positions shown; findings below may reference images not displayed]

FINDINGS: Postsurgical changes after right inter trochanteric intramedullary
nail placement. The hardware is well seated without evidence of
fracture, loosening, or malalignment. Anatomic alignment of
intertrochanteric fracture. Similar appearance of healed, chronic
mid femoral diaphyseal fracture. Ghost tracks from prior hardware
visualized in the proximal femur. No evidence of new fracture or
malalignment. Partially visualized left femoral intramedullary nail
without complicating features.
IMPRESSION: Postsurgical changes after right femoral nail revision with
placement of intertrochanteric screws in the setting of right
intertrochanteric hip fracture which demonstrates anatomic alignment
and no evidence of hardware complication.

## 2023-01-16 ENCOUNTER — Other Ambulatory Visit (INDEPENDENT_AMBULATORY_CARE_PROVIDER_SITE_OTHER): Payer: Medicare HMO

## 2023-01-16 ENCOUNTER — Encounter: Payer: Self-pay | Admitting: Orthopaedic Surgery

## 2023-01-16 ENCOUNTER — Ambulatory Visit: Payer: Medicare HMO | Admitting: Orthopaedic Surgery

## 2023-01-16 VITALS — BP 127/83 | HR 109 | Ht 69.0 in | Wt 170.0 lb

## 2023-01-16 DIAGNOSIS — S32040A Wedge compression fracture of fourth lumbar vertebra, initial encounter for closed fracture: Secondary | ICD-10-CM

## 2023-01-16 NOTE — Progress Notes (Signed)
Office Visit Note   Patient: [name] [name]           Date of Birth: 1940-12-19           MRN: [id number] Visit Date: 01/16/2023              Requested by: No referring provider defined for this encounter. PCP: Elias Else, MD (Inactive)   Assessment & Plan: Visit Diagnoses:  1. Compression fracture of L4 vertebra, initial encounter (HCC)     Plan: X-rays show an interval maintenance of height with some sclerosis consistent with healing L4 compression fracture.  His symptoms are significantly improved.  We will restart his [facility] physical therapy for lower extremity strengthening and balance and gait.  Return to see me on an as-needed basis.  Follow-Up Instructions: No follow-ups on file.   Orders:  Orders Placed This Encounter  Procedures   XR Lumbar Spine 2-3 Views   Ambulatory referral to Physical Therapy   No orders of the defined types were placed in this encounter.     Procedures: No procedures performed   Clinical Data: No additional findings.   Subjective: Chief Complaint  Patient presents with   Lower Back - Follow-up, Fracture    HPI 82 year old male returns with complex medical history including lymphoma, thrombocytopenia hypertension GERD and multiple history of fractures.  I fixed 1 hip fracture Dr. Jena Gauss did the other hip.  Also had facial fractures.  Bone density test was ordered somehow never got put through 4 completed and we will resubmit bone density test.  He states his back is getting better every week minimal symptoms now and he is using his walker again and states he is ready to restart physical therapy at [facility] for lower extremity strengthening gait and balance.  Review of Systems updated unchanged   Objective: Vital Signs: BP 127/83   Pulse (!) 109   Ht 5\' 9"  (1.753 m)   Wt 170 lb (77.1 kg)   BMI 25.10 kg/m   Physical Exam Constitutional:      Appearance: He is well-developed.  HENT:     Head: Normocephalic and  atraumatic.     Right Ear: External ear normal.     Left Ear: External ear normal.  Eyes:     Pupils: Pupils are equal, round, and reactive to light.  Neck:     Thyroid: No thyromegaly.     Trachea: No tracheal deviation.  Cardiovascular:     Rate and Rhythm: Normal rate.  Pulmonary:     Effort: Pulmonary effort is normal.     Breath sounds: No wheezing.  Abdominal:     General: Bowel sounds are normal.     Palpations: Abdomen is soft.  Musculoskeletal:     Cervical back: Neck supple.  Skin:    General: Skin is warm and dry.     Capillary Refill: Capillary refill takes less than 2 seconds.  Neurological:     Mental Status: He is alert and oriented to person, place, and time.  Psychiatric:        Behavior: Behavior normal.        Thought Content: Thought content normal.        Judgment: Judgment normal.     Ortho Exam patient ambulates with a walker.  He is able to get from sitting standing comfortably.  No numbness or tingling in the lower extremities.  Specialty Comments:  No specialty comments available.  Imaging: XR Lumbar Spine 2-3 Views  Result Date: 01/16/2023 AP lateral lumbar spine images are obtained and reviewed this again demonstrates the L4 compression fracture 30% unchanged from previous images.  Comparison to 12/04/2022 images. Impression: Stable L4 compression fracture    PMFS History: Patient Active Problem List   Diagnosis Date Noted   Lumbar compression fracture (HCC) 12/05/2022   Esophageal dysphagia    Acute esophagitis    Acute postoperative anemia due to expected blood loss 11/02/2021   Odynophagia    Constipation 10/29/2021   GERD (gastroesophageal reflux disease) 10/29/2021   Hyponatremia 10/29/2021   Closed right hip fracture (HCC) 10/28/2021   Multiple facial fractures, closed, initial encounter (HCC) 02/04/2020   Closed left hip fracture (HCC) 10/04/2019   Essential hypertension 10/04/2019   Fall    Port-A-Cath in place 02/04/2019    Thrombocytopenia (HCC) 01/22/2018   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   History of arthroscopy of left shoulder 07/12/2016   Port catheter in place 02/22/2016   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   Vitamin D deficiency 11/21/2012   Lymphoma (HCC) 06/13/2012   Past Medical History:  Diagnosis Date   Basal cell carcinoma 06/08/1998   Upper lip - CX3+5FU   Basal cell carcinoma 06/22/1998   Left front neck - CX3+5FU   Basal cell carcinoma 03/11/2004   Right temple (MOHs)   Basal cell carcinoma 04/24/2016   Left top shoulder - Clear   Basal cell carcinoma 01/14/2018   Right sideburn - CX3+5FU   Basal cell carcinoma 06/08/2020   sup & nod- left postauricular sulcus   Complication of anesthesia    Femur fracture (HCC)    Hip fracture (HCC)    Hypertension    Malignant neoplasm of pancreas, part unspecified    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   Right Ear Superior   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   Right Sideburn   Other malignant lymphomas, unspecified site, extranodal and solid organ sites    Squamous cell carcinoma of skin 09/26/2001   Left forehead - CX3+excision   Squamous cell carcinoma of skin 03/11/2004   Post right scalp - CX3+5FU   Squamous cell carcinoma of skin 03/11/2004   Left forehead (MOHs)   Squamous cell carcinoma of skin 03/11/2004   Right sideburn - CX3+5FU+excision   Squamous cell carcinoma of skin 12/01/2004   Left temple - CX3+excision   Squamous cell carcinoma of skin 04/13/2006   Mid scalp, sup - tx p bx   Squamous cell carcinoma of skin 04/13/2006   Mid scalp, inf - tx p bx   Squamous cell carcinoma of skin 04/13/2006   Left temple - tx p bx   Squamous cell carcinoma of skin 06/30/2010   Left low back - tx p bx   Squamous cell carcinoma of skin 06/23/2013   Front scalp - CX3+5FU   Squamous  cell carcinoma of skin 01/14/2018   Left scalp - CX3+5FU    No family history on file.  Past Surgical History:  Procedure Laterality Date   BIOPSY  11/03/2021   Procedure: BIOPSY;  Surgeon: Shellia Cleverly, DO;  Location: [facility];  Service: Gastroenterology;;   BIOPSY  01/17/2022   Procedure: BIOPSY;  Surgeon: Shellia Cleverly, DO;  Location: [facility];  Service: Gastroenterology;;   ESOPHAGOGASTRODUODENOSCOPY (EGD) WITH PROPOFOL N/A 11/03/2021   Procedure: ESOPHAGOGASTRODUODENOSCOPY (EGD) WITH PROPOFOL;  Surgeon: Shellia Cleverly, DO;  Location: [facility];  Service: Gastroenterology;  Laterality: N/A;   ESOPHAGOGASTRODUODENOSCOPY (EGD) WITH PROPOFOL N/A 01/17/2022   Procedure: ESOPHAGOGASTRODUODENOSCOPY (EGD) WITH PROPOFOL;  Surgeon: Shellia Cleverly, DO;  Location: [facility];  Service: Gastroenterology;  Laterality: N/A;   FEMUR IM NAIL Left 10/06/2019   Procedure: INTRAMEDULLARY (IM) RETROGRADE FEMORAL NAIL REMOVAL ( [employer]);  Surgeon: Eldred Manges, MD;  Location: [facility];  Service: Orthopedics;  Laterality: Left;   FEMUR SURGERY Bilateral    HARDWARE REMOVAL Right 10/28/2021   Procedure: HARDWARE REMOVAL;  Surgeon: Roby Lofts, MD;  Location: [facility] OR;  Service: Orthopedics;  Laterality: Right;   INTRAMEDULLARY (IM) NAIL INTERTROCHANTERIC Left 10/06/2019   Procedure: BIOMET AFFIXUS SHORT NAIL,  INTERTROCHANTRIC;  Surgeon: Eldred Manges, MD;  Location: [facility] OR;  Service: Orthopedics;  Laterality: Left;   INTRAMEDULLARY (IM) NAIL INTERTROCHANTERIC Right 10/28/2021   Procedure: INTRAMEDULLARY (IM) NAIL INTERTROCHANTRIC;  Surgeon: Roby Lofts, MD;  Location: [facility] OR;  Service: Orthopedics;  Laterality: Right;   LEG SURGERY Left    SHOULDER SURGERY Left    Social History   Occupational History   Not on file  Tobacco Use   Smoking status: Never   Smokeless tobacco: Never  Vaping Use   Vaping Use: Never used  Substance and Sexual Activity   Alcohol use: No    Drug use: No   Sexual activity: Not on file

## 2023-01-17 DIAGNOSIS — H34813 Central retinal vein occlusion, bilateral, with macular edema: Secondary | ICD-10-CM | POA: Diagnosis not present

## 2023-01-17 DIAGNOSIS — H43391 Other vitreous opacities, right eye: Secondary | ICD-10-CM | POA: Diagnosis not present

## 2023-01-17 DIAGNOSIS — H43813 Vitreous degeneration, bilateral: Secondary | ICD-10-CM | POA: Diagnosis not present

## 2023-01-17 DIAGNOSIS — H35373 Puckering of macula, bilateral: Secondary | ICD-10-CM | POA: Diagnosis not present

## 2023-01-23 DIAGNOSIS — S72141K Displaced intertrochanteric fracture of right femur, subsequent encounter for closed fracture with nonunion: Secondary | ICD-10-CM | POA: Diagnosis not present

## 2023-02-01 DIAGNOSIS — L218 Other seborrheic dermatitis: Secondary | ICD-10-CM | POA: Diagnosis not present

## 2023-02-01 DIAGNOSIS — L853 Xerosis cutis: Secondary | ICD-10-CM | POA: Diagnosis not present

## 2023-02-01 DIAGNOSIS — L570 Actinic keratosis: Secondary | ICD-10-CM | POA: Diagnosis not present

## 2023-02-01 DIAGNOSIS — X32XXXA Exposure to sunlight, initial encounter: Secondary | ICD-10-CM | POA: Diagnosis not present

## 2023-03-12 DIAGNOSIS — M19012 Primary osteoarthritis, left shoulder: Secondary | ICD-10-CM | POA: Diagnosis not present

## 2023-03-12 DIAGNOSIS — Z8781 Personal history of (healed) traumatic fracture: Secondary | ICD-10-CM | POA: Diagnosis not present

## 2023-03-12 DIAGNOSIS — M47896 Other spondylosis, lumbar region: Secondary | ICD-10-CM | POA: Diagnosis not present

## 2023-03-12 DIAGNOSIS — M13851 Other specified arthritis, right hip: Secondary | ICD-10-CM | POA: Diagnosis not present

## 2023-03-12 DIAGNOSIS — M13852 Other specified arthritis, left hip: Secondary | ICD-10-CM | POA: Diagnosis not present

## 2023-03-26 DIAGNOSIS — M545 Low back pain, unspecified: Secondary | ICD-10-CM | POA: Diagnosis not present

## 2023-03-27 DIAGNOSIS — R262 Difficulty in walking, not elsewhere classified: Secondary | ICD-10-CM | POA: Diagnosis not present

## 2023-03-27 DIAGNOSIS — R2689 Other abnormalities of gait and mobility: Secondary | ICD-10-CM | POA: Diagnosis not present

## 2023-03-27 DIAGNOSIS — M6281 Muscle weakness (generalized): Secondary | ICD-10-CM | POA: Diagnosis not present

## 2023-03-27 DIAGNOSIS — M25552 Pain in left hip: Secondary | ICD-10-CM | POA: Diagnosis not present

## 2023-03-29 DIAGNOSIS — I10 Essential (primary) hypertension: Secondary | ICD-10-CM | POA: Diagnosis not present

## 2023-03-29 DIAGNOSIS — N401 Enlarged prostate with lower urinary tract symptoms: Secondary | ICD-10-CM | POA: Diagnosis not present

## 2023-03-29 DIAGNOSIS — M25552 Pain in left hip: Secondary | ICD-10-CM | POA: Diagnosis not present

## 2023-03-29 DIAGNOSIS — M6281 Muscle weakness (generalized): Secondary | ICD-10-CM | POA: Diagnosis not present

## 2023-03-29 DIAGNOSIS — R2689 Other abnormalities of gait and mobility: Secondary | ICD-10-CM | POA: Diagnosis not present

## 2023-03-29 DIAGNOSIS — M47896 Other spondylosis, lumbar region: Secondary | ICD-10-CM | POA: Diagnosis not present

## 2023-03-29 DIAGNOSIS — C8330 Diffuse large B-cell lymphoma, unspecified site: Secondary | ICD-10-CM | POA: Diagnosis not present

## 2023-03-29 DIAGNOSIS — R262 Difficulty in walking, not elsewhere classified: Secondary | ICD-10-CM | POA: Diagnosis not present

## 2023-03-29 DIAGNOSIS — M545 Low back pain, unspecified: Secondary | ICD-10-CM | POA: Diagnosis not present

## 2023-04-03 DIAGNOSIS — I10 Essential (primary) hypertension: Secondary | ICD-10-CM | POA: Diagnosis not present

## 2023-04-03 DIAGNOSIS — E559 Vitamin D deficiency, unspecified: Secondary | ICD-10-CM | POA: Diagnosis not present

## 2023-04-03 DIAGNOSIS — R899 Unspecified abnormal finding in specimens from other organs, systems and tissues: Secondary | ICD-10-CM | POA: Diagnosis not present

## 2023-04-03 DIAGNOSIS — R5383 Other fatigue: Secondary | ICD-10-CM | POA: Diagnosis not present

## 2023-04-03 DIAGNOSIS — E782 Mixed hyperlipidemia: Secondary | ICD-10-CM | POA: Diagnosis not present

## 2023-04-03 DIAGNOSIS — R7303 Prediabetes: Secondary | ICD-10-CM | POA: Diagnosis not present

## 2023-04-03 DIAGNOSIS — Z125 Encounter for screening for malignant neoplasm of prostate: Secondary | ICD-10-CM | POA: Diagnosis not present

## 2023-04-03 DIAGNOSIS — E538 Deficiency of other specified B group vitamins: Secondary | ICD-10-CM | POA: Diagnosis not present

## 2023-04-03 DIAGNOSIS — N401 Enlarged prostate with lower urinary tract symptoms: Secondary | ICD-10-CM | POA: Diagnosis not present

## 2023-04-04 DIAGNOSIS — H43391 Other vitreous opacities, right eye: Secondary | ICD-10-CM | POA: Diagnosis not present

## 2023-04-04 DIAGNOSIS — H34813 Central retinal vein occlusion, bilateral, with macular edema: Secondary | ICD-10-CM | POA: Diagnosis not present

## 2023-04-04 DIAGNOSIS — H35373 Puckering of macula, bilateral: Secondary | ICD-10-CM | POA: Diagnosis not present

## 2023-04-04 DIAGNOSIS — H43813 Vitreous degeneration, bilateral: Secondary | ICD-10-CM | POA: Diagnosis not present

## 2023-04-10 DIAGNOSIS — R2689 Other abnormalities of gait and mobility: Secondary | ICD-10-CM | POA: Diagnosis not present

## 2023-04-10 DIAGNOSIS — M6281 Muscle weakness (generalized): Secondary | ICD-10-CM | POA: Diagnosis not present

## 2023-04-10 DIAGNOSIS — R262 Difficulty in walking, not elsewhere classified: Secondary | ICD-10-CM | POA: Diagnosis not present

## 2023-04-10 DIAGNOSIS — M25552 Pain in left hip: Secondary | ICD-10-CM | POA: Diagnosis not present

## 2023-04-12 DIAGNOSIS — E559 Vitamin D deficiency, unspecified: Secondary | ICD-10-CM | POA: Diagnosis not present

## 2023-04-12 DIAGNOSIS — R778 Other specified abnormalities of plasma proteins: Secondary | ICD-10-CM | POA: Diagnosis not present

## 2023-04-12 DIAGNOSIS — M6281 Muscle weakness (generalized): Secondary | ICD-10-CM | POA: Diagnosis not present

## 2023-04-12 DIAGNOSIS — M25552 Pain in left hip: Secondary | ICD-10-CM | POA: Diagnosis not present

## 2023-04-12 DIAGNOSIS — D696 Thrombocytopenia, unspecified: Secondary | ICD-10-CM | POA: Diagnosis not present

## 2023-04-12 DIAGNOSIS — R946 Abnormal results of thyroid function studies: Secondary | ICD-10-CM | POA: Diagnosis not present

## 2023-04-12 DIAGNOSIS — R748 Abnormal levels of other serum enzymes: Secondary | ICD-10-CM | POA: Diagnosis not present

## 2023-04-12 DIAGNOSIS — Z125 Encounter for screening for malignant neoplasm of prostate: Secondary | ICD-10-CM | POA: Diagnosis not present

## 2023-04-12 DIAGNOSIS — E785 Hyperlipidemia, unspecified: Secondary | ICD-10-CM | POA: Diagnosis not present

## 2023-04-12 DIAGNOSIS — I10 Essential (primary) hypertension: Secondary | ICD-10-CM | POA: Diagnosis not present

## 2023-04-12 DIAGNOSIS — R2689 Other abnormalities of gait and mobility: Secondary | ICD-10-CM | POA: Diagnosis not present

## 2023-04-12 DIAGNOSIS — R262 Difficulty in walking, not elsewhere classified: Secondary | ICD-10-CM | POA: Diagnosis not present

## 2023-04-12 DIAGNOSIS — N401 Enlarged prostate with lower urinary tract symptoms: Secondary | ICD-10-CM | POA: Diagnosis not present

## 2023-04-17 DIAGNOSIS — R262 Difficulty in walking, not elsewhere classified: Secondary | ICD-10-CM | POA: Diagnosis not present

## 2023-04-17 DIAGNOSIS — R2689 Other abnormalities of gait and mobility: Secondary | ICD-10-CM | POA: Diagnosis not present

## 2023-04-17 DIAGNOSIS — M6281 Muscle weakness (generalized): Secondary | ICD-10-CM | POA: Diagnosis not present

## 2023-04-17 DIAGNOSIS — M25552 Pain in left hip: Secondary | ICD-10-CM | POA: Diagnosis not present

## 2023-05-31 ENCOUNTER — Telehealth: Payer: Self-pay | Admitting: Medical Oncology

## 2023-05-31 NOTE — Telephone Encounter (Signed)
Pt moved to [place]. She said he needs his port flushed.  She requested  a referral to oncologist in [place] or [place] for continued care., port maintenance.

## 2023-06-05 ENCOUNTER — Encounter: Payer: Self-pay | Admitting: Medical Oncology

## 2023-06-05 ENCOUNTER — Telehealth: Payer: Self-pay | Admitting: Medical Oncology

## 2023-06-05 NOTE — Telephone Encounter (Signed)
LVM to return my call with the name of an oncology practice to where he would like to be referred.Marland Kitchen

## 2023-06-06 ENCOUNTER — Telehealth: Payer: Self-pay | Admitting: Medical Oncology

## 2023-06-06 NOTE — Telephone Encounter (Signed)
Faxed referral to to -[facility].

## 2023-06-07 ENCOUNTER — Other Ambulatory Visit: Payer: Self-pay | Admitting: Internal Medicine

## 2023-06-07 ENCOUNTER — Other Ambulatory Visit: Payer: Self-pay | Admitting: Medical Oncology

## 2023-06-07 DIAGNOSIS — C8251 Diffuse follicle center lymphoma, lymph nodes of head, face, and neck: Secondary | ICD-10-CM

## 2023-06-12 ENCOUNTER — Telehealth: Payer: Self-pay | Admitting: Medical Oncology

## 2023-06-12 NOTE — Telephone Encounter (Signed)
Spoke to [facility] and they have the referral and will call dtr today.

## 2023-06-12 NOTE — Telephone Encounter (Signed)
Dtr stated she has not heard from [facility]

## 2023-07-03 NOTE — Progress Notes (Signed)
 Oncology Clinic Consult Note  Patient Name: [name] [name] DOB:  05-22-1941 Age: 82 y.o. Encounter Date: 07/04/2023  Reason for Consult: Non-Hodkin Lymphoma Referring Physician:   Sherrod Sherrod POUR, MD [address] [place],  [place] [address]   Visit Diagnosis: 1. Lymphoma in remission   2. Diffuse large B-cell lymphoma, unspecified body region (CMS-HCC)      Cancer Staging <redacted file path>  No matching staging information was found for the patient.    Assessment/Plan:   Lymphoma: He has a history of lymphoma, diagnosed in January 2009 He completed treatment with R-CHOP chemotherapy x 4 and whole-body hyperthermia (in [place]) He has been in continuous complete remission since completing therapy in July 2009 07/04/2023 - transferring his care to [facility] He is clinically well at the present time He still has a port, and needs port flushes (every 3 months) He will return in 1 month for his first port flush He will return in approximately 10 months for exam and labs (coordinated with a port flush appointment)   All of his questions were answered.    History of Present Illness:   [name] [name] is a 82 y.o. male who is seen in consultation at the request of Sherrod POUR Sherrod for evaluation of Non-Hodkin Lymphoma.  He has a history of hypertension.  He has a remote history of diffuse large B-cell lymphoma.  This was diagnosed in January 2009.  He initially presented after CT scan showed an apparent pancreatic mass.  An initial biopsy demonstrated acute and chronic inflammation, with no evidence of malignancy.  He had a PET scan on 08/07/2007, and this showed a hypermetabolic mass arising at or just inferior to the uncinate process of the pancreas.  It was felt that, based on imaging characteristics, this was suspicious for lymphoma.  He had a biopsy of the mass on 10/14/2007, and this revealed a diffuse large B-cell lymphoma there was felt to be arising from the  mesentery.  The mass was 5.1 x 4.7 cm on the PET scan, with no other lymphadenopathy noted.  He investigated alternative therapies for his lymphoma, and made plans to travel to [place] to receive treatment with attenuated dose R-CHOP given concurrent with intraperitoneal chemotherapy and whole body hyperthermia.  He had a delay in traveling to [place] for treatment, and received a first cycle of (full dose) R-CHOP therapy in [place], but received all of his subsequent therapy in [place].  His treatment was completed in July 2009.  He received a total of 4 cycles of R-CHOP therapy.  He had a complete response to therapy.  Initially after treatment there was a small amount of mildly hypermetabolic soft tissue that remained in the abdomen, but over time this resolved without further treatment.  He was followed with regular imaging studies for at least a few years, and remained in a complete remission.  He has had annual follow-up evaluations with his oncologist in [place], but has not had imaging studies for several years.  He remains in a clinical complete remission.  He is transferring his care to this office after moving to the area.  He is accompanied to the office today by his daughter.  He notes that he has continued to feel well, with no signs or symptoms to suggest recurrence of lymphoma.    Hematology/Oncology History   No history exists.    No past medical history on file.  No past surgical history on file.  Social History   Occupational History  .  Not on file  Tobacco Use  . Smoking status: Never  . Smokeless tobacco: Never  Substance and Sexual Activity  . Alcohol use: Not on file  . Drug use: Not on file  . Sexual activity: Not on file    History reviewed. No pertinent family history.   The following portions of the patient's history were reviewed and updated as appropriate: allergies, current medications, past family history, past medical history, past  social history, past surgical history, and problem list.  I have independently reviewed the patient's medical records and outside data.   Review of Systems:  A comprehensive review of 14 systems was negative except for pertinent positives noted in HPI.   Physical Exam  Vitals:   07/04/23 1104  BP: 130/61  Pulse: 92  Resp: 16  Temp: 36.8 C (98.3 F)  SpO2: 96%   BSA: 1.92 meters squared  GENERAL:  Pleasant, no acute distress.     EYES:  EOMI, sclerae anicteric, conjunctivae pink.   HEENT:  Atraumatic.  NECK:  Supple, thyroid  midline. LYMPHATICS:  No palpable cervical, supraclavicular, axillary or inguinal LAN.     CV:  RRR, no m/r/g.   No LE edema x2. RESP:  CTAB. Unlabored.  No wheezing or rhonchi  GI:  Soft and nontender, no masses or HSM.  Normal bowel sounds. SKIN:  No rashes, bruises, or subcutaneous nodules.   MSK:  No bony pain or tenderness.  Nl gait.     PSYCH:  Affect appropriate.  Judgment and insight nl. NEURO:  Alert and oriented 3.  Neuro exam grossly unremarkable.       Karnofsky/Lansky Performance Status  100, Fully active, able to carry on all pre-disease performed without restriction (ECOG equivalent 0)    Distress Level Distress Level: 0  Distress screening was reviewed and discussed during consult, and No additional action taken. .    Orders/Results  No orders of the defined types were placed in this encounter.   No results found for: WBC, HGB, HCT, PLT, LDH, CREATININE, AST, CA125, CEA, AFPTM, CA199, HCGTM  This note was created utilizing [employer] dictation software, and may contain transcription errors that were missed during proofreading.

## 2023-07-10 NOTE — Progress Notes (Signed)
 [name] [name] is a 82 y.o. male referred by Dr Selinda Slocumb for transfer of care.   Hx of bilateral CRVO, s/p eylea  q3 months last 04/04/2023.   Brief Prior History: The Chief Complaint, HPI, ROS, and PFSHx as documented were approved and reviewed in detail and no other issues/changes are noted.  Current Drops:  None  Past Ocular History:  CRVO OU s CE/IOL OU  Past Medical History: HTN  Family History: Non contributary  Assessment/Plan:  1. CRVO OD LI: 04/04/2023 Eylea  2 mg Today 07/10/2023 - Subj: worse - VA: 20/70 - Exam: ON collaterals, vessel tortuosity, DBHs, ME - FA: macular leak, area of leakage including disc, no NVs, no PNP - OCT: IRF - A/P: ME OU, previously extended at 3 months. ME present at 12 weeks.  Eylea  2mg  and go back to 6 wks to determine interval.  2. CRVO OS LI: 04/04/2023 Eylea  2 mg s/p PRP Today 07/10/2023 - Subj: worse - VA: 20/400 - Exam: ON collaterals, vessel tortuosity, DBHs, ME - FA: macular leak, area of leakage and extensive non-perfusion, prob enlarged FAZ, no NVs - OCT: IRF - A/P: ME OU, previously extended at 3 months. ME present at 12 weeks.  Eylea  2mg  and go back to 6 wks to determine interval.  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 Pre-diabetic -cont with BP/BG control.  Eylea  2mg  OU today.  RTC 6 weeks with DFE/OCT and possible eylea  2mg  OU Determine interval and schedule from there  I personally performed or jointly provided the services with an auxiliary provider Banker, Pensions consultant, etc.) and a clinical associate, but no participation of a resident.  Ozell Pickup, MD, PhD

## 2023-08-27 NOTE — Progress Notes (Signed)
 [name] [name] is a 82 y.o. male referred by Dr Selinda Slocumb for transfer of care.   Hx of bilateral CRVO, s/p eylea  q3 months last 04/04/2023.   Brief Prior History: The Chief Complaint, HPI, ROS, and PFSHx as documented were approved and reviewed in detail and no other issues/changes are noted.  Current Drops:  None  Past Ocular History:  CRVO OU s CE/IOL OU  Past Medical History: HTN  Family History: Non contributary  Assessment/Plan:  1. CRVO OD LI: 04/04/2023 Eylea  2 mg Today 07/10/2023 - Subj: worse - VA: 20/70 - Exam: ON collaterals, vessel tortuosity, DBHs, ME - FA: macular leak, area of leakage including disc, no NVs, no PNP - OCT: IRF - A/P: ME OU, previously extended at 3 months. ME present at 12 weeks.  Eylea  2mg  and go back to 6 wks to determine interval. TODAY 08/27/2023: - VA: 20/50+2    OD,  - OCT: compact, irregular, better A/P: ME resolved at 6 weeks.  Eylea  2mg  OS and extend to 8 then 10 wks.   2. CRVO OS LI: 04/04/2023 Eylea  2 mg s/p PRP 07/10/2023 - Subj: worse - VA: 20/400 - Exam: ON collaterals, vessel tortuosity, DBHs, ME - FA: macular leak, area of leakage and extensive non-perfusion, prob enlarged FAZ, no NVs - OCT: IRF - A/P: ME OU, previously extended at 3 months. ME present at 12 weeks.  Eylea  2mg  and go back to 6 wks to determine interval. TODAY 08/27/2023: - VA: , 20/200    OS - OCT: compact, irregular, better A/P: ME resolved at 6 weeks.  Eylea  2mg  OS and extend to 8 then 10 wks.   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 Pre-diabetic -cont with BP/BG control.  Eylea  2mg  OU today. Extend to 8 then 10 wks.   RTC OK to check MRx outside ([employer]) 8 wks OCT and Eylea  2mg  OU 18 weeks with DFE/OCT and possible eylea  2mg  OU  I personally performed or jointly provided the services with an auxiliary provider Banker, Pensions consultant, etc.) and a clinical associate, but no participation of a resident.  Ozell Pickup, MD,  PhD

## 2023-10-05 ENCOUNTER — Encounter: Payer: Self-pay | Admitting: Internal Medicine

## 2023-10-11 ENCOUNTER — Telehealth: Payer: Self-pay | Admitting: Medical Oncology

## 2023-10-11 NOTE — Telephone Encounter (Signed)
Pt appt for tomorrow cancelled and LVM about it on [place] cell phone.  [name] [name] now resides in [place], [place]

## 2023-10-12 ENCOUNTER — Ambulatory Visit: Payer: Medicare HMO | Admitting: Internal Medicine

## 2023-10-12 ENCOUNTER — Inpatient Hospital Stay: Payer: Self-pay
